# Patient Record
Sex: Female | Born: 1990
Health system: Southern US, Community
[De-identification: ages and names within clinical notes are randomized; demographics above are authoritative.]

## PROBLEM LIST (undated history)

## (undated) ENCOUNTER — Ambulatory Visit: Discharge status: Absent without leave | Payer: 59

## (undated) ENCOUNTER — Emergency Department (HOSPITAL_COMMUNITY): Discharge status: Absent without leave | Payer: BLUE CROSS/BLUE SHIELD

## (undated) DIAGNOSIS — F329 Major depressive disorder, single episode, unspecified: Secondary | ICD-10-CM

## (undated) DIAGNOSIS — F32A Depression, unspecified: Secondary | ICD-10-CM

## (undated) DIAGNOSIS — F419 Anxiety disorder, unspecified: Secondary | ICD-10-CM

## (undated) DIAGNOSIS — E079 Disorder of thyroid, unspecified: Secondary | ICD-10-CM

## (undated) DIAGNOSIS — L732 Hidradenitis suppurativa: Secondary | ICD-10-CM

## (undated) DIAGNOSIS — E282 Polycystic ovarian syndrome: Secondary | ICD-10-CM

## (undated) DIAGNOSIS — N938 Other specified abnormal uterine and vaginal bleeding: Secondary | ICD-10-CM

## (undated) DIAGNOSIS — E039 Hypothyroidism, unspecified: Secondary | ICD-10-CM

## (undated) DIAGNOSIS — L0291 Cutaneous abscess, unspecified: Secondary | ICD-10-CM

## (undated) HISTORY — PX: TONSILLECTOMY: SUR1361

## (undated) HISTORY — DX: Other specified abnormal uterine and vaginal bleeding: N93.8

## (undated) HISTORY — PX: ADENOIDECTOMY: SHX5191

## (undated) HISTORY — PX: HERNIA REPAIR: SHX51

---
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
## 2009-04-05 ENCOUNTER — Emergency Department (HOSPITAL_COMMUNITY): Admission: EM | Admit: 2009-04-05 | Discharge: 2009-04-05 | Payer: Self-pay | Admitting: Emergency Medicine

## 2009-05-14 ENCOUNTER — Emergency Department (HOSPITAL_COMMUNITY): Admission: EM | Admit: 2009-05-14 | Discharge: 2009-05-14 | Payer: Self-pay | Admitting: Emergency Medicine

## 2009-05-22 ENCOUNTER — Emergency Department (HOSPITAL_COMMUNITY): Admission: EM | Admit: 2009-05-22 | Discharge: 2009-05-22 | Payer: Self-pay | Admitting: Emergency Medicine

## 2009-10-18 ENCOUNTER — Emergency Department (HOSPITAL_COMMUNITY): Admission: EM | Admit: 2009-10-18 | Discharge: 2009-10-18 | Payer: Self-pay | Admitting: Emergency Medicine

## 2009-11-10 ENCOUNTER — Emergency Department (HOSPITAL_COMMUNITY): Admission: EM | Admit: 2009-11-10 | Discharge: 2009-11-10 | Payer: Self-pay | Admitting: Emergency Medicine

## 2009-12-17 ENCOUNTER — Emergency Department (HOSPITAL_COMMUNITY)
Admission: EM | Admit: 2009-12-17 | Discharge: 2009-12-17 | Payer: Self-pay | Source: Home / Self Care | Admitting: Emergency Medicine

## 2009-12-19 ENCOUNTER — Emergency Department (HOSPITAL_COMMUNITY): Admission: EM | Admit: 2009-12-19 | Discharge: 2009-09-15 | Payer: Self-pay | Admitting: Emergency Medicine

## 2010-01-11 ENCOUNTER — Emergency Department (HOSPITAL_COMMUNITY)
Admission: EM | Admit: 2010-01-11 | Discharge: 2010-01-11 | Payer: Self-pay | Source: Home / Self Care | Admitting: Emergency Medicine

## 2010-04-17 LAB — RAPID STREP SCREEN (MED CTR MEBANE ONLY): Streptococcus, Group A Screen (Direct): NEGATIVE

## 2010-04-17 LAB — STREP A DNA PROBE

## 2010-04-18 LAB — URINALYSIS, ROUTINE W REFLEX MICROSCOPIC
Bilirubin Urine: NEGATIVE
Nitrite: NEGATIVE
Specific Gravity, Urine: 1.025 (ref 1.005–1.030)
Urobilinogen, UA: 0.2 mg/dL (ref 0.0–1.0)
pH: 6.5 (ref 5.0–8.0)

## 2010-04-18 LAB — URINE MICROSCOPIC-ADD ON

## 2010-04-18 LAB — PREGNANCY, URINE: Preg Test, Ur: NEGATIVE

## 2010-05-27 NOTE — Op Note (Signed)
NAMEJENEFER, Kathleen Velasquez               ACCOUNT NO.:  [id number]   MEDICAL RECORD NO.:  [id number]          PATIENT TYPE:  AMB   LOCATION:  DSC                          FACILITY:  [facility]   PHYSICIAN:  Karol T. Lazarus Salines, M.D. DATE OF BIRTH:  1990/08/02   DATE OF PROCEDURE:  08/02/2006  DATE OF DISCHARGE:                               OPERATIVE REPORT   PREOPERATIVE DIAGNOSIS:  Recurrent streptococcal tonsillitis with  tonsillar hypertrophy.   POSTOPERATIVE DIAGNOSIS:  Recurrent streptococcal tonsillitis with  tonsillar hypertrophy.   PROCEDURE PERFORMED:  Tonsillectomy and adenoidectomy.   SURGEON:  Gloris Manchester. Lazarus Salines, M.D.   ANESTHESIA:  General orotracheal.   BLOOD LOSS:  Minimal.   COMPLICATIONS:  None.   FINDINGS:  There were 3-4+ very bulky tonsils with a normal soft palate.  75% obstructive adenoids.  Congested inferior turbinates.   PROCEDURE:  With the patient in a comfortable supine position, general  orotracheal anesthesia was induced without difficulty.  At an  appropriate level, the table was turned 90 degrees and the patient  placed in Trendelenburg.  A clean preparation and draping was  accomplished.  Taking care to protect lips, teeth, and endotracheal  tube, the Crowe-Davis mouth gag was introduced, expanded for  visualization, and suspended from the Mayo stand in the standard  fashion.  The findings were as described above.  Palate retractor and  mirror were used to visualize the nasopharynx with the findings as  described above.  Nasal speculum was used to inspect the anterior nose  with the findings as described above.  We infiltrated 0.50% Xylocaine  with 1:200,000 epinephrine, 8 mL total into the peritonsillar planes for  intraoperative hemostasis.  Several minutes were allowed for this to  take effect.   The adenoid pad was swept free of the nasopharynx using a single midline  pass with a sharp adenoid curette.  The tissue was carefully removed  from the  field and passed off as specimen.  The nasopharynx was  suctioned clean and packed with saline moistened tonsil sponges for  hemostasis.   Beginning on the left side, the tonsil was grasped and retracted  medially.  The mucosa overlying the anterior and superior poles was  coagulated and then cut down to the capsule of the tonsil.  Using the  cautery tip as a blunt dissector, lysing fibrous bands, and coagulating  crossing vessels as identified, the tonsil was dissected from its  muscular fossa.  There was a moderate amount of fibrosis consistent with  recurrent infections.  The tonsil was removed in its entirety as  determined by examination both tonsil and fossa.  A small additional  quantity of cautery rendered the fossa hemostatic.  After completing the  left tonsillectomy, the right side was done in identical fashion.   After completing both tonsillectomies and rendering the oropharynx  hemostatic, the nasopharynx was unpacked.  A red rubber catheter was  passed through the nose and out the mouth to serve as a [employer] investment [employer].  Using suction cautery and indirect visualization, small  adenoid tags at the cornea and lateral bands were ablated,  and the  adenoid bed proper was cauterized for hemostasis.  This was done in  several passes using irrigation to accurately localize the bleeding  sites.  Upon achieving hemostasis in the nasopharynx, the oropharynx was  again observed to be hemostatic.  At this point, the palate retractor  and mouth gag were relaxed for several minutes.  Upon re-expansion,  hemostasis was persistent.  At this point, the procedure was completed.  The palate retractor and mouth gag were relaxed and removed.  The dental  status was intact including orthodontic appliances.  The patient was  returned to anesthesia, awakened, extubated, and transferred to recovery  in stable condition.   COMMENT:  A 20 year old white female with a history of recurrent   streptococcal tonsillitis as well as obstructive hypertrophy was the  indication for today's procedure.  Anticipated routine postoperative  recovery with attention to analgesia, antibiosis, hydration, and  observation for bleeding, emesis, or airway compromise.      Gloris Manchester. Lazarus Salines, M.D.  Electronically Signed     KTW/MEDQ  D:  08/02/2006  T:  08/02/2006  Job:  [id number]   cc:   Jonell Cluck, M.D.

## 2010-06-01 ENCOUNTER — Emergency Department (HOSPITAL_COMMUNITY)
Admission: EM | Admit: 2010-06-01 | Discharge: 2010-06-02 | Disposition: A | Discharge status: Home / Self Care | Payer: Self-pay | Attending: Emergency Medicine | Admitting: Emergency Medicine

## 2010-06-01 DIAGNOSIS — L600 Ingrowing nail: Secondary | ICD-10-CM | POA: Insufficient documentation

## 2010-06-09 ENCOUNTER — Emergency Department (HOSPITAL_COMMUNITY)
Admission: EM | Admit: 2010-06-09 | Discharge: 2010-06-09 | Disposition: A | Discharge status: Home / Self Care | Payer: Self-pay | Attending: Emergency Medicine | Admitting: Emergency Medicine

## 2010-06-09 DIAGNOSIS — L02619 Cutaneous abscess of unspecified foot: Secondary | ICD-10-CM | POA: Insufficient documentation

## 2010-06-09 DIAGNOSIS — L03039 Cellulitis of unspecified toe: Secondary | ICD-10-CM | POA: Insufficient documentation

## 2010-07-14 ENCOUNTER — Inpatient Hospital Stay (HOSPITAL_COMMUNITY): Payer: Self-pay

## 2010-07-14 ENCOUNTER — Inpatient Hospital Stay (HOSPITAL_COMMUNITY)
Admission: AD | Admit: 2010-07-14 | Discharge: 2010-07-14 | Disposition: A | Discharge status: Home / Self Care | Payer: Self-pay | Source: Ambulatory Visit | Attending: Obstetrics & Gynecology | Admitting: Obstetrics & Gynecology

## 2010-07-14 DIAGNOSIS — N949 Unspecified condition associated with female genital organs and menstrual cycle: Secondary | ICD-10-CM

## 2010-07-14 DIAGNOSIS — N938 Other specified abnormal uterine and vaginal bleeding: Secondary | ICD-10-CM

## 2010-07-14 LAB — URINALYSIS, ROUTINE W REFLEX MICROSCOPIC
Bilirubin Urine: NEGATIVE
Ketones, ur: NEGATIVE mg/dL
Nitrite: NEGATIVE
Protein, ur: NEGATIVE mg/dL
Urobilinogen, UA: 0.2 mg/dL (ref 0.0–1.0)

## 2010-07-14 LAB — WET PREP, GENITAL
Trich, Wet Prep: NONE SEEN
Yeast Wet Prep HPF POC: NONE SEEN

## 2010-07-14 LAB — CBC
HCT: 40.7 % (ref 36.0–46.0)
MCH: 28.3 pg (ref 26.0–34.0)
MCHC: 32.7 g/dL (ref 30.0–36.0)
MCV: 86.6 fL (ref 78.0–100.0)
Platelets: 326 10*3/uL (ref 150–400)
RDW: 16.2 % — ABNORMAL HIGH (ref 11.5–15.5)
WBC: 11.1 10*3/uL — ABNORMAL HIGH (ref 4.0–10.5)

## 2010-07-14 LAB — URINE MICROSCOPIC-ADD ON

## 2010-07-15 LAB — GC/CHLAMYDIA PROBE AMP, GENITAL
Chlamydia, DNA Probe: NEGATIVE
GC Probe Amp, Genital: NEGATIVE

## 2010-07-28 ENCOUNTER — Emergency Department (HOSPITAL_COMMUNITY): Payer: BC Managed Care – PPO

## 2010-07-28 ENCOUNTER — Emergency Department (HOSPITAL_COMMUNITY)
Admission: EM | Admit: 2010-07-28 | Discharge: 2010-07-28 | Disposition: A | Discharge status: Home / Self Care | Payer: BC Managed Care – PPO | Attending: Emergency Medicine | Admitting: Emergency Medicine

## 2010-07-28 ENCOUNTER — Encounter: Payer: Self-pay | Admitting: *Deleted

## 2010-07-28 DIAGNOSIS — M542 Cervicalgia: Secondary | ICD-10-CM | POA: Insufficient documentation

## 2010-07-28 DIAGNOSIS — M25519 Pain in unspecified shoulder: Secondary | ICD-10-CM | POA: Insufficient documentation

## 2010-07-28 DIAGNOSIS — R079 Chest pain, unspecified: Secondary | ICD-10-CM | POA: Insufficient documentation

## 2010-07-28 DIAGNOSIS — S161XXA Strain of muscle, fascia and tendon at neck level, initial encounter: Secondary | ICD-10-CM

## 2010-07-28 DIAGNOSIS — M545 Low back pain, unspecified: Secondary | ICD-10-CM

## 2010-07-28 DIAGNOSIS — S20219A Contusion of unspecified front wall of thorax, initial encounter: Secondary | ICD-10-CM

## 2010-07-28 DIAGNOSIS — S7002XA Contusion of left hip, initial encounter: Secondary | ICD-10-CM

## 2010-07-28 DIAGNOSIS — S7000XA Contusion of unspecified hip, initial encounter: Secondary | ICD-10-CM | POA: Insufficient documentation

## 2010-07-28 DIAGNOSIS — S139XXA Sprain of joints and ligaments of unspecified parts of neck, initial encounter: Secondary | ICD-10-CM | POA: Insufficient documentation

## 2010-07-28 MED ORDER — KETOROLAC TROMETHAMINE 30 MG/ML IJ SOLN
30.0000 mg | Freq: Once | INTRAMUSCULAR | Status: AC
  Administered 2010-07-28: 30 mg via INTRAVENOUS
  Filled 2010-07-28: qty 1

## 2010-07-28 MED ORDER — HYDROCODONE-ACETAMINOPHEN 5-325 MG PO TABS: ORAL_TABLET | ORAL | Status: AC

## 2010-07-28 MED ORDER — HYDROCODONE-ACETAMINOPHEN 5-325 MG PO TABS
1.0000 | ORAL_TABLET | Freq: Once | ORAL | Status: AC
  Administered 2010-07-28: 1 via ORAL
  Filled 2010-07-28: qty 1

## 2010-07-28 MED ORDER — CYCLOBENZAPRINE HCL 10 MG PO TABS: 10.0000 mg | ORAL_TABLET | Freq: Three times a day (TID) | ORAL | Status: AC | PRN
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
## 2010-08-06 ENCOUNTER — Emergency Department (HOSPITAL_COMMUNITY)
Admission: EM | Admit: 2010-08-06 | Discharge: 2010-08-07 | Discharge status: Against Medical Advice | Payer: BC Managed Care – PPO | Attending: Emergency Medicine | Admitting: Emergency Medicine

## 2010-08-06 ENCOUNTER — Encounter (HOSPITAL_COMMUNITY): Payer: Self-pay | Admitting: *Deleted

## 2010-08-06 DIAGNOSIS — L0291 Cutaneous abscess, unspecified: Secondary | ICD-10-CM | POA: Insufficient documentation

## 2010-08-06 DIAGNOSIS — L0390 Cellulitis, unspecified: Secondary | ICD-10-CM | POA: Insufficient documentation

## 2010-08-06 DIAGNOSIS — Z5320 Procedure and treatment not carried out because of patient's decision for unspecified reasons: Secondary | ICD-10-CM | POA: Insufficient documentation

## 2010-08-06 HISTORY — DX: Cutaneous abscess, unspecified: L02.91

## 2010-08-06 NOTE — ED Notes (Signed)
Patient to lower back pain x 4 days

## 2010-08-07 NOTE — ED Notes (Signed)
Patient not in room, did not inform staff of leaving AMA

## 2010-08-07 NOTE — ED Provider Notes (Signed)
History     Chief Complaint  Patient presents with  . Abscess   HPI  Past Medical History  Diagnosis Date  . Abscess     Past Surgical History  Procedure Date  . Hernia repair   . Tonsillectomy   . Adenoidectomy     History reviewed. No pertinent family history.  History  Substance Use Topics  . Smoking status: Current Everyday Smoker -- 1.0 packs/day    Types: Cigarettes  . Smokeless tobacco: Never Used  . Alcohol Use: Yes     social use    OB History    Grav Para Term Preterm Abortions TAB SAB Ect Mult Living                  Review of Systems  Physical Exam  BP 138/76  Pulse 96  Temp(Src) 98.8 F (37.1 C) (Oral)  Resp 18  Ht 5\' 4"  (1.626 m)  Wt 264 lb (119.75 kg)  BMI 45.32 kg/m2  SpO2 100%  LMP 07/20/2010  Physical Exam  ED Course  Procedures  MDM Pt left after triage, not seen by me.       Ethelda Chick, MD 08/07/10 [phone]

## 2010-10-02 ENCOUNTER — Emergency Department (HOSPITAL_COMMUNITY)
Admission: EM | Admit: 2010-10-02 | Discharge: 2010-10-03 | Disposition: A | Discharge status: Home / Self Care | Payer: BC Managed Care – PPO | Attending: Emergency Medicine | Admitting: Emergency Medicine

## 2010-10-02 ENCOUNTER — Encounter (HOSPITAL_COMMUNITY): Payer: Self-pay | Admitting: *Deleted

## 2010-10-02 ENCOUNTER — Emergency Department (HOSPITAL_COMMUNITY): Payer: BC Managed Care – PPO

## 2010-10-02 DIAGNOSIS — R197 Diarrhea, unspecified: Secondary | ICD-10-CM | POA: Insufficient documentation

## 2010-10-02 DIAGNOSIS — M549 Dorsalgia, unspecified: Secondary | ICD-10-CM | POA: Insufficient documentation

## 2010-10-02 DIAGNOSIS — R110 Nausea: Secondary | ICD-10-CM | POA: Insufficient documentation

## 2010-10-02 DIAGNOSIS — R109 Unspecified abdominal pain: Secondary | ICD-10-CM | POA: Insufficient documentation

## 2010-10-02 DIAGNOSIS — F17200 Nicotine dependence, unspecified, uncomplicated: Secondary | ICD-10-CM | POA: Insufficient documentation

## 2010-10-02 DIAGNOSIS — N920 Excessive and frequent menstruation with regular cycle: Secondary | ICD-10-CM

## 2010-10-02 LAB — URINALYSIS, ROUTINE W REFLEX MICROSCOPIC
Bilirubin Urine: NEGATIVE
Bilirubin Urine: NEGATIVE
Glucose, UA: NEGATIVE
Ketones, ur: NEGATIVE
Leukocytes, UA: NEGATIVE
Nitrite: NEGATIVE
Specific Gravity, Urine: >1.03 — ABNORMAL HIGH (ref 1.005–1.030)
Urobilinogen, UA: 0.2 mg/dL (ref 0.0–1.0)
pH: 7

## 2010-10-02 LAB — URINE MICROSCOPIC-ADD ON

## 2010-10-02 LAB — BASIC METABOLIC PANEL
BUN: 6 mg/dL (ref 6–23)
GFR calc Af Amer: >60 mL/min (ref >60)
GFR calc non Af Amer: >60 mL/min (ref >60)
Potassium: 3.8 mEq/L (ref 3.5–5.1)

## 2010-10-02 LAB — CBC
HCT: 42.8 % (ref 36.0–46.0)
MCH: 28.5 pg (ref 26.0–34.0)
MCV: 87 fL (ref 78.0–100.0)
Platelets: 380 10*3/uL (ref 150–400)
RBC: 4.92 MIL/uL (ref 3.87–5.11)

## 2010-10-02 MED ORDER — SODIUM CHLORIDE 0.9 % IV SOLN: INTRAVENOUS | Status: DC

## 2010-10-02 MED ORDER — IOHEXOL 300 MG/ML  SOLN
100.0000 mL | Freq: Once | INTRAMUSCULAR | Status: AC | PRN
  Administered 2010-10-02: 100 mL via INTRAVENOUS

## 2010-10-02 MED ORDER — SODIUM CHLORIDE 0.9 % IV BOLUS (SEPSIS)
250.0000 mL | Freq: Once | INTRAVENOUS | Status: AC
  Administered 2010-10-02: 18:00:00 via INTRAVENOUS

## 2010-10-02 MED ORDER — HYDROMORPHONE HCL 1 MG/ML IJ SOLN
1.0000 mg | Freq: Once | INTRAMUSCULAR | Status: AC
  Administered 2010-10-02: 1 mg via INTRAVENOUS
  Filled 2010-10-02: qty 1

## 2010-10-02 MED ORDER — HYDROMORPHONE HCL 1 MG/ML IJ SOLN
1.0000 mg | Freq: Once | INTRAMUSCULAR | Status: AC
  Administered 2010-10-02: 1 mg via INTRAVENOUS

## 2010-10-02 MED ORDER — ONDANSETRON HCL 4 MG/2ML IJ SOLN
4.0000 mg | Freq: Once | INTRAMUSCULAR | Status: AC
  Administered 2010-10-02: 4 mg via INTRAVENOUS
  Filled 2010-10-02: qty 2

## 2010-10-02 MED ORDER — HYDROMORPHONE HCL 1 MG/ML IJ SOLN
INTRAMUSCULAR | Status: AC
  Administered 2010-10-02: 1 mg via INTRAVENOUS
  Filled 2010-10-02: qty 1

## 2010-10-02 NOTE — ED Provider Notes (Signed)
History   Scribed for Kathleen Jakes, MD, the patient was seen in room APA17/APA17. This chart was scribed by Clarita Crane. This patient's care was started at 5:03PM.  CSN: [id number] Arrival date & time: 10/02/2010  4:54 PM  Chief Complaint  Patient presents with  . Flank Pain    HPI    HPI ANALIS Kathleen Velasquez is a 20 y.o. female who presents to the Emergency Department complaining of moderate to severe waxing and waning cramping pain to bilateral sides of pelvis and back onset 7 hours ago and persistent since with associated diarrhea and nausea. Denies vomiting, hematochezia, chest pain, diaphoresis, HA. Reports she has experienced no relief from 800mg  Ibuprofen. Patient notes she was recently started on Sprintec after being taken off of Megestrol by Dr. Despina Hidden several days prior to onset of current symptoms. Reports she started her menstrual cycle yesterday with last menstrual cycle prior to current 2 months ago (07/22/2010). Patient with h/o left inguinal hernia, tonsillectomy and adenoidectomy.   HPI ELEMENTS: Location: bilateral sides of lower back and pelvis Onset: 7 hours ago Duration: persistent since onset  Timing: waxing and waning  Quality: cramping  Severity: moderate to severe   Context:  as above  Associated symptoms: +diarrhea and nausea.  Denies vomiting, hematochezia, chest pain, diaphoresis, HA.   PAST MEDICAL HISTORY:  Past Medical History  Diagnosis Date  . Abscess     PAST SURGICAL HISTORY:  Past Surgical History  Procedure Date  . Hernia repair   . Tonsillectomy   . Adenoidectomy     FAMILY HISTORY:  History reviewed. No pertinent family history.   SOCIAL HISTORY: History   Social History  . Marital Status: Single    Spouse Name: N/A    Number of Children: N/A  . Years of Education: N/A   Social History Main Topics  . Smoking status: Current Everyday Smoker -- 1.0 packs/day    Types: Cigarettes  . Smokeless tobacco: Never Used  . Alcohol  Use: Yes     social use  . Drug Use: No  . Sexually Active: No   Other Topics Concern  . None   Social History Narrative  . None      Review of Systems  Review of Systems  Constitutional: Negative for fever.  HENT: Negative for rhinorrhea.   Eyes: Negative for pain.  Respiratory: Negative for cough and shortness of breath.   Cardiovascular: Negative for chest pain.  Gastrointestinal: Positive for nausea, abdominal pain and diarrhea. Negative for vomiting and blood in stool.  Genitourinary: Negative for dysuria.  Musculoskeletal: Positive for back pain.  Skin: Negative for rash.  Neurological: Negative for weakness and headaches.    Allergies  Bactrim and Penicillins  Home Medications   Current Outpatient Rx  Name Route Sig Dispense Refill  . IBUPROFEN 200 MG PO TABS Oral Take 800 mg by mouth every 6 (six) hours as needed. For pain     . SPRINTEC 28 PO Oral Take 1 tablet by mouth at bedtime. At 10pm every night     . MEGESTROL ACETATE 20 MG PO TABS Oral Take 20 mg by mouth daily.        Physical Exam    BP 111/64  Pulse 75  Temp(Src) 98.6 F (37 C) (Oral)  Resp 23  Ht 5\' 5"  (1.651 m)  Wt 260 lb (117.935 kg)  BMI 43.27 kg/m2  SpO2 100%  LMP 10/01/2010  Physical Exam  Nursing note and vitals reviewed. Constitutional: She  is oriented to person, place, and time. She appears well-developed and well-nourished.  HENT:  Head: Normocephalic and atraumatic.  Eyes: Conjunctivae are normal. Pupils are equal, round, and reactive to light.  Neck: Neck supple.  Cardiovascular: Normal rate and regular rhythm.  Exam reveals no gallop and no friction rub.   No murmur heard. Pulmonary/Chest: Effort normal and breath sounds normal. She has no wheezes.  Abdominal: Soft. Bowel sounds are normal. She exhibits no distension. There is no tenderness. There is no CVA tenderness.  Musculoskeletal: Normal range of motion. She exhibits no edema.  Neurological: She is alert and  oriented to person, place, and time. No sensory deficit.  Skin: Skin is warm and dry.  Psychiatric: She has a normal mood and affect. Her behavior is normal.    ED Course  Procedures  OTHER DATA REVIEWED: Nursing notes, vital signs, and past medical records reviewed. Lab results reviewed and considered Imaging results reviewed and considered  DIAGNOSTIC STUDIES: Oxygen Saturation is 100% on room air, normal by my interpretation.    LABS / RADIOLOGY: Results for orders placed during the hospital encounter of 10/02/10  URINALYSIS, ROUTINE W REFLEX MICROSCOPIC      Component Value Range   Color, Urine YELLOW  YELLOW    Appearance CLEAR  CLEAR    Specific Gravity, Urine >1.030 (*) 1.005 - 1.030    pH 6.0  5.0 - 8.0    Glucose, UA NEGATIVE  NEGATIVE (mg/dL)   Hgb urine dipstick MODERATE (*) NEGATIVE    Bilirubin Urine NEGATIVE  NEGATIVE    Ketones, ur TRACE (*) NEGATIVE (mg/dL)   Protein, ur TRACE (*) NEGATIVE (mg/dL)   Urobilinogen, UA 0.2  0.0 - 1.0 (mg/dL)   Nitrite NEGATIVE  NEGATIVE    Leukocytes, UA NEGATIVE  NEGATIVE   BASIC METABOLIC PANEL      Component Value Range   Sodium 135  135 - 145 (mEq/L)   Potassium 3.8  3.5 - 5.1 (mEq/L)   Chloride 102  96 - 112 (mEq/L)   CO2 22  19 - 32 (mEq/L)   Glucose, Bld 98  70 - 99 (mg/dL)   BUN 6  6 - 23 (mg/dL)   Creatinine, Ser 4.09  0.50 - 1.10 (mg/dL)   Calcium 9.5  8.4 - 81.1 (mg/dL)   GFR calc non Af Amer >60  >60 (mL/min)   GFR calc Af Amer >60  >60 (mL/min)  CBC      Component Value Range   WBC 15.8 (*) 4.0 - 10.5 (K/uL)   RBC 4.92  3.87 - 5.11 (MIL/uL)   Hemoglobin 14.0  12.0 - 15.0 (g/dL)   HCT 91.4  78.2 - 95.6 (%)   MCV 87.0  78.0 - 100.0 (fL)   MCH 28.5  26.0 - 34.0 (pg)   MCHC 32.7  30.0 - 36.0 (g/dL)   RDW 21.3  08.6 - 57.8 (%)   Platelets 380  150 - 400 (K/uL)  POCT PREGNANCY, URINE      Component Value Range   Preg Test, Ur NEGATIVE    URINE MICROSCOPIC-ADD ON      Component Value Range   Squamous  Epithelial / LPF FEW (*) RARE    WBC, UA 3-6  <3 (WBC/hpf)   RBC / HPF 7-10  <3 (RBC/hpf)   Bacteria, UA FEW (*) RARE    Casts GRANULAR CAST (*) NEGATIVE    Urine-Other MUCOUS PRESENT     Ct Abdomen Pelvis W Contrast  10/02/2010  *  RADIOLOGY REPORT*  Clinical Data: 20 year old female with bilateral flank and abdominal pain with nausea.  CT ABDOMEN AND PELVIS WITH CONTRAST  Technique:  Multidetector CT imaging of the abdomen and pelvis was performed following the standard protocol during bolus administration of intravenous contrast.  Contrast: OMNIPAQUE IOHEXOL 300 MG/ML IV SOLN  Comparison: 02/07/2004  Findings: The liver, spleen, adrenal glands, kidneys, pancreas and gallbladder are unremarkable.  No free fluid, enlarged lymph nodes, biliary dilation or abdominal aortic aneurysm identified. The bowel and bladder are unremarkable. Fluid in the cervix/vagina is noted may represent blood. There is no evidence of mass, bowel obstruction or pneumoperitoneum.  No acute or suspicious bony abnormalities are identified.  IMPRESSION: Fluid within the vagina/cervix which may be related to menses.  No other significant abnormalities identified.  Original Report Authenticated By: Rosendo Gros, M.D.    PROCEDURES:  ED COURSE / COORDINATION OF CARE: Orders Placed This Encounter  Procedures  . CT Abdomen Pelvis W Contrast  . Urinalysis, Routine w reflex microscopic  . Basic metabolic panel  . CBC  . Urine microscopic-add on  . In and Out Cath  . Maintain IV access  . Pregnancy, urine POC  7:27PM- Patient reports pain not improved at this time. Informed of current lab results. Will perform CT scan for further evaluation.  7:51PM- Nurse informs that patient states vaginal bleeding has slowed significantly.  12:03AM- Patient informed of lab and imaging results. Informed of intent to d/c home. Patient agrees with plan set forth at this time.  MDM: Differential Diagnosis:  SUSPECT HEAVY MENSES AND  CRAMPS FOLLOWING SUPRESSION THERAPY. CT NEGATIVE.    PLAN: Discharge Home The patient is to return the emergency department if there is any worsening of symptoms. I have reviewed the discharge instructions with the patient/family  CONDITION ON DISCHARGE: Good.  DIAGNOSIS: No diagnosis found.   MEDICATIONS GIVEN IN THE E.D.  Medications  Norgestimate-Eth Estradiol (SPRINTEC 28 PO) (not administered)  0.9 %  sodium chloride infusion (not administered)  sodium chloride 0.9 % bolus 250 mL ( mL Intravenous Given 10/02/10 1804)  HYDROmorphone (DILAUDID) injection 1 mg (1 mg Intravenous Given 10/02/10 1803)  ondansetron (ZOFRAN) injection 4 mg (4 mg Intravenous Given 10/02/10 1803)  HYDROmorphone (DILAUDID) injection 1 mg (1 mg Intravenous Given 10/02/10 1943)  ondansetron (ZOFRAN) injection 4 mg (4 mg Intravenous Given 10/02/10 1943)  iohexol (OMNIPAQUE) 300 MG/ML injection 100 mL (100 mL Intravenous Contrast Given 10/02/10 2058)  HYDROmorphone (DILAUDID) injection 1 mg (1 mg Intravenous Given 10/02/10 2330)      I personally performed the services described in this documentation, which was scribed in my presence. The recorded information has been reviewed and considered. Kathleen Jakes, MD      Kathleen Jakes, MD 10/03/10 [phone]

## 2010-10-02 NOTE — ED Notes (Signed)
Pt c/o pain to llq that radiates around to lower back.

## 2010-10-03 MED ORDER — OXYCODONE-ACETAMINOPHEN 5-325 MG PO TABS: 1.0000 | ORAL_TABLET | ORAL | Status: AC | PRN

## 2010-11-29 ENCOUNTER — Encounter (HOSPITAL_COMMUNITY): Payer: Self-pay

## 2010-11-29 ENCOUNTER — Emergency Department (HOSPITAL_COMMUNITY)
Admission: EM | Admit: 2010-11-29 | Discharge: 2010-11-29 | Disposition: A | Discharge status: Home / Self Care | Payer: BC Managed Care – PPO | Attending: Emergency Medicine | Admitting: Emergency Medicine

## 2010-11-29 DIAGNOSIS — K029 Dental caries, unspecified: Secondary | ICD-10-CM | POA: Insufficient documentation

## 2010-11-29 DIAGNOSIS — R51 Headache: Secondary | ICD-10-CM | POA: Insufficient documentation

## 2010-11-29 DIAGNOSIS — F17200 Nicotine dependence, unspecified, uncomplicated: Secondary | ICD-10-CM | POA: Insufficient documentation

## 2010-11-29 MED ORDER — ERYTHROMYCIN BASE 250 MG PO TABS: ORAL_TABLET | ORAL | Status: DC

## 2010-11-29 MED ORDER — MELOXICAM 7.5 MG PO TABS: ORAL_TABLET | ORAL | Status: DC

## 2010-11-29 MED ORDER — TRAMADOL HCL 50 MG PO TABS: ORAL_TABLET | ORAL | Status: DC

## 2010-11-29 NOTE — ED Notes (Signed)
Pt c/o pain to left ear x 2 days.  Reports ear feels puffy behind her ear and pain down into left side of neck.

## 2010-11-29 NOTE — ED Provider Notes (Signed)
History     CSN: [id number] Arrival date & time: 11/29/2010 10:52 AM   None     Chief Complaint  Patient presents with  . Otalgia    (Consider location/radiation/quality/duration/timing/severity/associated sxs/prior treatment) Patient is a 20 y.o. female presenting with ear pain. The history is provided by the patient.  Otalgia This is a new problem. The current episode started 2 days ago. There is pain in the left ear. The problem occurs constantly. The problem has not changed since onset.There has been no fever. The pain is moderate. Associated symptoms include headaches. Pertinent negatives include no ear discharge, no sore throat, no abdominal pain, no vomiting, no neck pain, no cough and no rash. Her past medical history does not include chronic ear infection or hearing loss.    Past Medical History  Diagnosis Date  . Abscess     Past Surgical History  Procedure Date  . Hernia repair   . Tonsillectomy   . Adenoidectomy     No family history on file.  History  Substance Use Topics  . Smoking status: Current Everyday Smoker -- 1.0 packs/day    Types: Cigarettes  . Smokeless tobacco: Never Used  . Alcohol Use: Yes     social use    OB History    Grav Para Term Preterm Abortions TAB SAB Ect Mult Living                  Review of Systems  Constitutional: Negative for activity change.       All ROS Neg except as noted in HPI  HENT: Positive for ear pain and dental problem. Negative for nosebleeds, sore throat, neck pain and ear discharge.   Eyes: Negative for photophobia and discharge.  Respiratory: Negative for cough, shortness of breath and wheezing.   Cardiovascular: Negative for chest pain and palpitations.  Gastrointestinal: Negative for vomiting, abdominal pain and blood in stool.  Genitourinary: Negative for dysuria, frequency and hematuria.  Musculoskeletal: Negative for back pain and arthralgias.  Skin: Negative.  Negative for rash.  Neurological:  Positive for headaches. Negative for dizziness, seizures and speech difficulty.  Psychiatric/Behavioral: Negative for hallucinations and confusion.    Allergies  Bactrim and Penicillins  Home Medications   Current Outpatient Rx  Name Route Sig Dispense Refill  . IBUPROFEN 200 MG PO TABS Oral Take 800 mg by mouth every 6 (six) hours as needed. For pain       BP 144/81  Pulse 85  Temp(Src) 98.6 F (37 C) (Oral)  Resp 18  Ht 5\' 5"  (1.651 m)  Wt 263 lb (119.296 kg)  BMI 43.77 kg/m2  SpO2 100%  LMP 10/01/2010  Physical Exam  Nursing note and vitals reviewed. Constitutional: She is oriented to person, place, and time. She appears well-developed and well-nourished.  Non-toxic appearance.  HENT:  Head: Normocephalic.  Right Ear: Tympanic membrane and external ear normal.  Left Ear: Tympanic membrane and external ear normal.  Mouth/Throat: Oropharynx is clear and moist.       Multiple dental caries. Left more than right.  Eyes: EOM and lids are normal. Pupils are equal, round, and reactive to light.  Neck: Normal range of motion. Neck supple. Carotid bruit is not present.  Cardiovascular: Normal rate, regular rhythm, normal heart sounds, intact distal pulses and normal pulses.   Pulmonary/Chest: Breath sounds normal. No respiratory distress.  Abdominal: Soft. Bowel sounds are normal. There is no tenderness. There is no guarding.  Musculoskeletal: Normal range of motion.  Lymphadenopathy:       Head (right side): No submandibular adenopathy present.       Head (left side): No submandibular adenopathy present.    She has no cervical adenopathy.  Neurological: She is alert and oriented to person, place, and time. She has normal strength. No cranial nerve deficit or sensory deficit.  Skin: Skin is warm and dry.  Psychiatric: She has a normal mood and affect. Her speech is normal.    ED Course: Pt to see a dentist as soon as possible.  Procedures (including critical care  time)  Labs Reviewed - No data to display No results found.   No diagnosis found.    MDM  I have reviewed nursing notes, vital signs, and all appropriate lab and imaging results for this patient.        Kathie Dike, [place] 12/07/10 [phone]

## 2010-11-29 NOTE — ED Notes (Signed)
Pt a/ox4. resp even and unlabored. NAD at this time. D/C instructions  And Rx x3 reviewed with pt. Pt verbalized understanding. Pt ambulated to POV with steady gate.

## 2010-12-09 NOTE — ED Provider Notes (Signed)
Medical screening examination/treatment/procedure(s) were performed by non-physician practitioner and as supervising physician I was immediately available for consultation/collaboration.  Shelda Jakes, MD 12/09/10 [phone]

## 2010-12-29 ENCOUNTER — Emergency Department (HOSPITAL_COMMUNITY)
Admission: EM | Admit: 2010-12-29 | Discharge: 2010-12-30 | Discharge status: Against Medical Advice | Payer: BC Managed Care – PPO | Attending: Emergency Medicine | Admitting: Emergency Medicine

## 2010-12-29 ENCOUNTER — Encounter (HOSPITAL_COMMUNITY): Payer: Self-pay

## 2010-12-29 DIAGNOSIS — Z0389 Encounter for observation for other suspected diseases and conditions ruled out: Secondary | ICD-10-CM | POA: Insufficient documentation

## 2010-12-29 NOTE — ED Notes (Signed)
Pt reports in the past 3 weeks has had 5 styes on eyes.  Reports this sty is on the upper lid of left eye.  Eye is red and swollen.

## 2010-12-30 ENCOUNTER — Encounter (HOSPITAL_COMMUNITY): Payer: Self-pay | Admitting: *Deleted

## 2010-12-30 ENCOUNTER — Emergency Department (HOSPITAL_COMMUNITY)
Admission: EM | Admit: 2010-12-30 | Discharge: 2010-12-30 | Disposition: A | Discharge status: Home / Self Care | Payer: BC Managed Care – PPO | Attending: Emergency Medicine | Admitting: Emergency Medicine

## 2010-12-30 DIAGNOSIS — H5710 Ocular pain, unspecified eye: Secondary | ICD-10-CM | POA: Insufficient documentation

## 2010-12-30 DIAGNOSIS — F17200 Nicotine dependence, unspecified, uncomplicated: Secondary | ICD-10-CM | POA: Insufficient documentation

## 2010-12-30 DIAGNOSIS — H0016 Chalazion left eye, unspecified eyelid: Secondary | ICD-10-CM

## 2010-12-30 DIAGNOSIS — H0019 Chalazion unspecified eye, unspecified eyelid: Secondary | ICD-10-CM | POA: Insufficient documentation

## 2010-12-30 MED ORDER — TOBRAMYCIN 0.3 % OP SOLN
2.0000 [drp] | Freq: Four times a day (QID) | OPHTHALMIC | Status: DC
  Administered 2010-12-30: 2 [drp] via OPHTHALMIC
  Filled 2010-12-30: qty 5

## 2010-12-30 MED ORDER — DOXYCYCLINE HYCLATE 100 MG PO CAPS: 100.0000 mg | ORAL_CAPSULE | Freq: Two times a day (BID) | ORAL | Status: AC

## 2010-12-30 MED ORDER — DOXYCYCLINE HYCLATE 100 MG PO TABS
100.0000 mg | ORAL_TABLET | Freq: Once | ORAL | Status: AC
  Administered 2010-12-30: 100 mg via ORAL
  Filled 2010-12-30: qty 1

## 2010-12-30 NOTE — ED Provider Notes (Signed)
History     CSN: [id number] Arrival date & time: 12/30/2010  6:01 PM   None     Chief Complaint  Patient presents with  . Eye Pain    (Consider location/radiation/quality/duration/timing/severity/associated sxs/prior treatment) Patient is a 20 y.o. female presenting with eye pain. The history is provided by the patient. No language interpreter was used.  Eye Pain This is a new problem. Episode onset: 2 days ago. The problem occurs constantly. The problem has been gradually worsening. Exacerbated by: blinking. She has tried nothing for the symptoms.    Past Medical History  Diagnosis Date  . Abscess     Past Surgical History  Procedure Date  . Hernia repair   . Tonsillectomy   . Adenoidectomy     History reviewed. No pertinent family history.  History  Substance Use Topics  . Smoking status: Current Everyday Smoker -- 1.0 packs/day    Types: Cigarettes  . Smokeless tobacco: Never Used  . Alcohol Use: Yes     social use    OB History    Grav Para Term Preterm Abortions TAB SAB Ect Mult Living                  Review of Systems  Eyes: Positive for pain. Negative for photophobia, discharge, redness, itching and visual disturbance.  All other systems reviewed and are negative.    Allergies  Bactrim and Penicillins  Home Medications   Current Outpatient Rx  Name Route Sig Dispense Refill  . IBUPROFEN 200 MG PO TABS Oral Take 800 mg by mouth every 6 (six) hours as needed. For pain    . MEGESTROL ACETATE 20 MG PO TABS Oral Take 20 mg by mouth at bedtime.      . TETRAHYDROZOLINE HCL 0.05 % OP SOLN Left Eye Place 2 drops into the left eye as needed. For relief       BP 133/85  Pulse 96  Temp(Src) 99.3 F (37.4 C) (Oral)  Resp 16  Ht 5\' 4"  (1.626 m)  Wt 250 lb (113.399 kg)  BMI 42.91 kg/m2  SpO2 99%  LMP 12/01/2010  Physical Exam  Nursing note and vitals reviewed. Constitutional: She is oriented to person, place, and time. She appears  well-developed and well-nourished. No distress.  HENT:  Head: Normocephalic and atraumatic.  Eyes: Conjunctivae and EOM are normal. Pupils are equal, round, and reactive to light. Right eye exhibits no chemosis, no discharge, no exudate and no hordeolum. No foreign body present in the right eye. Left eye exhibits no chemosis, no discharge, no exudate and no hordeolum. No foreign body present in the left eye. No scleral icterus.       Pt has what appears to be a chalzion under L upper lateral lid.  Mild lid swelling  Neck: Normal range of motion.  Cardiovascular: Normal rate, regular rhythm and normal heart sounds.   Pulmonary/Chest: Effort normal and breath sounds normal.  Abdominal: Soft. She exhibits no distension. There is no tenderness.  Musculoskeletal: Normal range of motion.  Neurological: She is alert and oriented to person, place, and time.  Skin: Skin is warm and dry. She is not diaphoretic.  Psychiatric: She has a normal mood and affect. Judgment normal.    ED Course  Procedures (including critical care time)  Labs Reviewed - No data to display No results found.   No diagnosis found.    MDM          Worthy Rancher, PA 12/30/10  [address]  Worthy Rancher, [place] 12/30/10 1905

## 2010-12-30 NOTE — ED Notes (Signed)
Pt has redness and swelling to her left upper eyelid since Saturday.

## 2010-12-31 NOTE — ED Provider Notes (Signed)
Medical screening examination/treatment/procedure(s) were performed by non-physician practitioner and as supervising physician I was immediately available for consultation/collaboration.  Nicoletta Dress. Colon Branch, MD 12/31/10 1343

## 2011-04-29 ENCOUNTER — Other Ambulatory Visit: Payer: Self-pay | Admitting: Obstetrics and Gynecology

## 2011-04-29 ENCOUNTER — Other Ambulatory Visit (HOSPITAL_COMMUNITY)
Admission: RE | Admit: 2011-04-29 | Discharge: 2011-04-29 | Disposition: A | Discharge status: Home / Self Care | Payer: BC Managed Care – PPO | Source: Ambulatory Visit | Attending: Obstetrics and Gynecology | Admitting: Obstetrics and Gynecology

## 2011-04-29 DIAGNOSIS — Z01419 Encounter for gynecological examination (general) (routine) without abnormal findings: Secondary | ICD-10-CM | POA: Insufficient documentation

## 2011-09-06 ENCOUNTER — Encounter (HOSPITAL_COMMUNITY): Payer: Self-pay | Admitting: *Deleted

## 2011-09-06 ENCOUNTER — Emergency Department (HOSPITAL_COMMUNITY)
Admission: EM | Admit: 2011-09-06 | Discharge: 2011-09-06 | Disposition: A | Discharge status: Home / Self Care | Payer: BC Managed Care – PPO | Attending: Emergency Medicine | Admitting: Emergency Medicine

## 2011-09-06 DIAGNOSIS — F17200 Nicotine dependence, unspecified, uncomplicated: Secondary | ICD-10-CM | POA: Insufficient documentation

## 2011-09-06 DIAGNOSIS — L02411 Cutaneous abscess of right axilla: Secondary | ICD-10-CM

## 2011-09-06 DIAGNOSIS — IMO0002 Reserved for concepts with insufficient information to code with codable children: Secondary | ICD-10-CM | POA: Insufficient documentation

## 2011-09-06 MED ORDER — IBUPROFEN 800 MG PO TABS
800.0000 mg | ORAL_TABLET | Freq: Once | ORAL | Status: AC
  Administered 2011-09-06: 800 mg via ORAL
  Filled 2011-09-06: qty 1

## 2011-09-06 MED ORDER — CIPROFLOXACIN HCL 250 MG PO TABS
500.0000 mg | ORAL_TABLET | Freq: Once | ORAL | Status: AC
  Administered 2011-09-06: 500 mg via ORAL
  Filled 2011-09-06: qty 2

## 2011-09-06 MED ORDER — ONDANSETRON HCL 4 MG PO TABS
4.0000 mg | ORAL_TABLET | Freq: Once | ORAL | Status: AC
  Administered 2011-09-06: 4 mg via ORAL
  Filled 2011-09-06: qty 1

## 2011-09-06 MED ORDER — DOXYCYCLINE HYCLATE 100 MG PO CAPS: 100.0000 mg | ORAL_CAPSULE | Freq: Two times a day (BID) | ORAL | Status: AC

## 2011-09-06 MED ORDER — DOXYCYCLINE HYCLATE 100 MG PO TABS
100.0000 mg | ORAL_TABLET | Freq: Once | ORAL | Status: AC
  Administered 2011-09-06: 100 mg via ORAL
  Filled 2011-09-06: qty 1

## 2011-09-06 MED ORDER — HYDROCODONE-ACETAMINOPHEN 5-325 MG PO TABS
2.0000 | ORAL_TABLET | Freq: Once | ORAL | Status: AC
  Administered 2011-09-06: 2 via ORAL
  Filled 2011-09-06: qty 2

## 2011-09-06 MED ORDER — CIPROFLOXACIN HCL 500 MG PO TABS: 500.0000 mg | ORAL_TABLET | Freq: Two times a day (BID) | ORAL | Status: AC

## 2011-09-06 MED ORDER — HYDROCODONE-ACETAMINOPHEN 7.5-325 MG PO TABS: 1.0000 | ORAL_TABLET | ORAL | Status: AC | PRN

## 2011-09-06 NOTE — ED Notes (Signed)
Pt c/o abscess under right arm that has been there for 3 weeks but is now hurting more. Pt also states she had nuva ring placed and questions if she is pregnant.

## 2011-09-06 NOTE — ED Notes (Signed)
See triage and EDP note.

## 2011-09-06 NOTE — ED Provider Notes (Signed)
History     CSN: [id number]  Arrival date & time 09/06/11  2042   First MD Initiated Contact with Patient 09/06/11 2155      Chief Complaint  Patient presents with  . Abscess    (Consider location/radiation/quality/duration/timing/severity/associated sxs/prior treatment) Patient is a 21 y.o. female presenting with abscess. The history is provided by the patient.  Abscess  This is a new problem. The current episode started more than one week ago. The onset was gradual. The problem occurs frequently. The problem has been gradually worsening. Affected Location: right axilla. The problem is moderate. The abscess is characterized by redness and painfulness. Pertinent negatives include no anorexia, no fever, no vomiting and no cough. There were no sick contacts.    Past Medical History  Diagnosis Date  . Abscess     Past Surgical History  Procedure Date  . Hernia repair   . Tonsillectomy   . Adenoidectomy     History reviewed. No pertinent family history.  History  Substance Use Topics  . Smoking status: Current Everyday Smoker -- 1.0 packs/day    Types: Cigarettes  . Smokeless tobacco: Never Used  . Alcohol Use: Yes     social use    OB History    Grav Para Term Preterm Abortions TAB SAB Ect Mult Living                  Review of Systems  Constitutional: Negative for fever and activity change.       All ROS Neg except as noted in HPI  HENT: Negative for nosebleeds and neck pain.   Eyes: Negative for photophobia and discharge.  Respiratory: Negative for cough, shortness of breath and wheezing.   Cardiovascular: Negative for chest pain and palpitations.  Gastrointestinal: Negative for vomiting, abdominal pain, blood in stool and anorexia.  Genitourinary: Negative for dysuria, frequency and hematuria.  Musculoskeletal: Negative for back pain and arthralgias.  Skin: Negative.        abscess  Neurological: Negative for dizziness, seizures and speech difficulty.    Psychiatric/Behavioral: Negative for hallucinations and confusion.    Allergies  Bactrim and Penicillins  Home Medications   Current Outpatient Rx  Name Route Sig Dispense Refill  . CIPROFLOXACIN HCL 500 MG PO TABS Oral Take 1 tablet (500 mg total) by mouth every 12 (twelve) hours. 10 tablet 0  . DOXYCYCLINE HYCLATE 100 MG PO CAPS Oral Take 1 capsule (100 mg total) by mouth 2 (two) times daily. 14 capsule 0  . IBUPROFEN 200 MG PO TABS Oral Take 800 mg by mouth every 6 (six) hours as needed. For pain    . MEGESTROL ACETATE 20 MG PO TABS Oral Take 20 mg by mouth at bedtime.      . TETRAHYDROZOLINE HCL 0.05 % OP SOLN Left Eye Place 2 drops into the left eye as needed. For relief       BP 121/65  Pulse 88  Temp 99.3 F (37.4 C) (Oral)  Resp 18  Ht 5\' 4"  (1.626 m)  Wt 250 lb (113.399 kg)  BMI 42.91 kg/m2  SpO2 100%  LMP 08/26/2011  Physical Exam  Nursing note and vitals reviewed. Constitutional: She is oriented to person, place, and time. She appears well-developed and well-nourished.  Non-toxic appearance.  HENT:  Head: Normocephalic.  Right Ear: Tympanic membrane and external ear normal.  Left Ear: Tympanic membrane and external ear normal.  Eyes: EOM and lids are normal. Pupils are equal, round, and reactive to  light.  Neck: Normal range of motion. Neck supple. Carotid bruit is not present.  Cardiovascular: Normal rate, regular rhythm, normal heart sounds, intact distal pulses and normal pulses.   Pulmonary/Chest: Breath sounds normal. No respiratory distress.  Abdominal: Soft. Bowel sounds are normal. There is no tenderness. There is no guarding.  Musculoskeletal: Normal range of motion.       There are 3 raised red warm areas of the right axilla. The largest one moves from the mid axilla to the outer portion of the axilla posteriorly. There is no drainage. There is no red streaking. The brachial and radial pulses are 2+ and symmetrical on the right. The grip is symmetrical  with the left. There is good capillary refill present.  Lymphadenopathy:       Head (right side): No submandibular adenopathy present.       Head (left side): No submandibular adenopathy present.    She has no cervical adenopathy.  Neurological: She is alert and oriented to person, place, and time. She has normal strength. No cranial nerve deficit or sensory deficit.  Skin: Skin is warm and dry.  Psychiatric: She has a normal mood and affect. Her speech is normal.    ED Course  Procedures (including critical care time)   Labs Reviewed  POCT PREGNANCY, URINE   No results found.   1. Abscess of axilla, right       MDM  I have reviewed nursing notes, vital signs, and all appropriate lab and imaging results for this patient. Patient has 3 abscess areas of the right axilla. Temperature is 99.3 tonight. There is no reported temperature elevation prior to her coming to the emergency department. Patient instructed to see the surgeon for proper evaluation and management of multiple abscess in one site. The patient is advised to use warm saltwater soaks, doxycycline 2 times daily with food, and Cipro 2 times daily with food. Patient is also given a prescription for Norco one every 4 hours as needed for pain #15. Surgical referral, given to the patient.       Kathie Dike, [place] 09/06/11 2207

## 2011-09-07 NOTE — ED Provider Notes (Signed)
Medical screening examination/treatment/procedure(s) were performed by non-physician practitioner and as supervising physician I was immediately available for consultation/collaboration.   Shelda Jakes, MD 09/07/11 1215

## 2011-09-14 ENCOUNTER — Encounter (HOSPITAL_COMMUNITY): Payer: Self-pay | Admitting: *Deleted

## 2011-09-14 ENCOUNTER — Emergency Department (HOSPITAL_COMMUNITY)
Admission: EM | Admit: 2011-09-14 | Discharge: 2011-09-14 | Disposition: A | Discharge status: Home / Self Care | Payer: BC Managed Care – PPO | Attending: Emergency Medicine | Admitting: Emergency Medicine

## 2011-09-14 ENCOUNTER — Emergency Department (HOSPITAL_COMMUNITY): Payer: BC Managed Care – PPO

## 2011-09-14 DIAGNOSIS — S9030XA Contusion of unspecified foot, initial encounter: Secondary | ICD-10-CM

## 2011-09-14 DIAGNOSIS — W208XXA Other cause of strike by thrown, projected or falling object, initial encounter: Secondary | ICD-10-CM | POA: Insufficient documentation

## 2011-09-14 DIAGNOSIS — F17200 Nicotine dependence, unspecified, uncomplicated: Secondary | ICD-10-CM | POA: Insufficient documentation

## 2011-09-14 MED ORDER — HYDROCODONE-ACETAMINOPHEN 5-325 MG PO TABS: 1.0000 | ORAL_TABLET | Freq: Four times a day (QID) | ORAL | Status: AC | PRN

## 2011-09-14 NOTE — ED Notes (Signed)
Dropped a car jack onto rt foot today, Hit lt 4th toe against a couch, contusion present.

## 2011-09-14 NOTE — ED Notes (Signed)
Bruising to left forth toe and dorsal aspect of right foot.

## 2011-09-14 NOTE — ED Provider Notes (Signed)
History     CSN: [id number]  Arrival date & time 09/14/11  2004   First MD Initiated Contact with Patient 09/14/11 2023      Chief Complaint  Patient presents with  . Foot Pain    (Consider location/radiation/quality/duration/timing/severity/associated sxs/prior treatment) HPI Comments: Dropped a car jack on her R foot and stubbed L 4th toe on a couch today.  Patient is a 21 y.o. female presenting with lower extremity pain. The history is provided by the patient. No language interpreter was used.  Foot Pain This is a new problem. The current episode started today. The problem has been gradually worsening. Pertinent negatives include no chills, fever or numbness. The symptoms are aggravated by walking and standing. She has tried nothing for the symptoms.    Past Medical History  Diagnosis Date  . Abscess     Past Surgical History  Procedure Date  . Hernia repair   . Tonsillectomy   . Adenoidectomy     History reviewed. No pertinent family history.  History  Substance Use Topics  . Smoking status: Current Everyday Smoker -- 1.0 packs/day    Types: Cigarettes  . Smokeless tobacco: Never Used  . Alcohol Use: Yes     social use    OB History    Grav Para Term Preterm Abortions TAB SAB Ect Mult Living                  Review of Systems  Constitutional: Negative for fever and chills.  Musculoskeletal:       Foot injuries   Skin: Positive for wound.  Neurological: Negative for numbness.  All other systems reviewed and are negative.    Allergies  Bactrim and Penicillins  Home Medications   Current Outpatient Rx  Name Route Sig Dispense Refill  . DOXYCYCLINE HYCLATE 100 MG PO CAPS Oral Take 1 capsule (100 mg total) by mouth 2 (two) times daily. 14 capsule 0  . ETONOGESTREL-ETHINYL ESTRADIOL 0.12-0.015 MG/24HR VA RING Vaginal Place 1 each vaginally every 28 (twenty-eight) days. Insert vaginally and leave in place for 3 consecutive weeks, then remove for 1  week.    Marland Kitchen HYDROCODONE-ACETAMINOPHEN 7.5-325 MG PO TABS Oral Take 1 tablet by mouth every 4 (four) hours as needed for pain. 15 tablet 0  . IBUPROFEN 200 MG PO TABS Oral Take 800 mg by mouth every 6 (six) hours as needed. For pain    . CIPROFLOXACIN HCL 500 MG PO TABS Oral Take 1 tablet (500 mg total) by mouth every 12 (twelve) hours. 10 tablet 0    BP 140/79  Pulse 100  Temp 99.5 F (37.5 C) (Oral)  Ht 5\' 4"  (1.626 m)  Wt 250 lb (113.399 kg)  BMI 42.91 kg/m2  SpO2 100%  LMP 08/26/2011  Physical Exam  Nursing note and vitals reviewed. Constitutional: She is oriented to person, place, and time. She appears well-developed and well-nourished. No distress.  HENT:  Head: Normocephalic and atraumatic.  Eyes: EOM are normal.  Neck: Normal range of motion.  Cardiovascular: Normal rate, regular rhythm and normal heart sounds.   Pulmonary/Chest: Effort normal and breath sounds normal.  Abdominal: Soft. She exhibits no distension. There is no tenderness.  Musculoskeletal: Normal range of motion.       Feet:  Neurological: She is alert and oriented to person, place, and time.  Skin: Skin is warm and dry.  Psychiatric: She has a normal mood and affect. Judgment normal.    ED Course  Procedures (  including critical care time)  Labs Reviewed - No data to display Dg Foot Complete Left  09/14/2011  *RADIOLOGY REPORT*  Clinical Data: Left fourth toe pain and bruising following an injury today.  LEFT FOOT - COMPLETE 3+ VIEW  Comparison: 09/15/2009  Findings: Mild posterior and minimal inferior calcaneal spur formation.  No fracture or dislocation seen.  IMPRESSION: No fracture.   Original Report Authenticated By: Darrol Angel, M.D.    Dg Foot Complete Right  09/14/2011  *RADIOLOGY REPORT*  Clinical Data: Right foot pain following a crush injury today.  RIGHT FOOT COMPLETE - 3+ VIEW  Comparison: 03/22/2006.  Findings: Mild posterior calcaneal spur formation.  No fracture or dislocation seen.   IMPRESSION: No fracture.   Original Report Authenticated By: Darrol Angel, M.D.      No diagnosis found.    MDM  Crutches Wt bearing as tolerated.  Ice, elevation. ibuprofen rx-hydrocodone, 12 F/u with PCP      -  Evalina Field, PA 09/14/11 2154

## 2011-09-14 NOTE — ED Notes (Signed)
Pt discharged. Pt stable at time of discharge. Medications reviewed pt has no questions regarding discharge at this time. Pt voiced understanding of discharge instructions.  

## 2011-09-15 NOTE — ED Provider Notes (Signed)
Medical screening examination/treatment/procedure(s) were performed by non-physician practitioner and as supervising physician I was immediately available for consultation/collaboration.   Joya Gaskins, MD 09/15/11 1009

## 2011-09-30 ENCOUNTER — Encounter (HOSPITAL_COMMUNITY): Payer: Self-pay | Admitting: Pharmacy Technician

## 2011-09-30 ENCOUNTER — Encounter (HOSPITAL_COMMUNITY): Payer: Self-pay

## 2011-09-30 ENCOUNTER — Encounter (HOSPITAL_COMMUNITY)
Admission: RE | Admit: 2011-09-30 | Discharge: 2011-09-30 | Disposition: A | Discharge status: Home / Self Care | Payer: BC Managed Care – PPO | Source: Ambulatory Visit | Attending: General Surgery | Admitting: General Surgery

## 2011-09-30 LAB — BASIC METABOLIC PANEL
CO2: 25 mEq/L (ref 19–32)
Calcium: 9.5 mg/dL (ref 8.4–10.5)
Chloride: 100 mEq/L (ref 96–112)
Sodium: 137 mEq/L (ref 135–145)

## 2011-09-30 LAB — CBC WITH DIFFERENTIAL/PLATELET
Eosinophils Relative: 2 % (ref 0–5)
HCT: 42 % (ref 36.0–46.0)
Lymphocytes Relative: 36 % (ref 12–46)
Lymphs Abs: 3.9 10*3/uL (ref 0.7–4.0)
MCV: 85.7 fL (ref 78.0–100.0)
Neutro Abs: 6.4 10*3/uL (ref 1.7–7.7)
Platelets: 356 10*3/uL (ref 150–400)
RBC: 4.9 MIL/uL (ref 3.87–5.11)
WBC: 11 10*3/uL — ABNORMAL HIGH (ref 4.0–10.5)

## 2011-09-30 LAB — SURGICAL PCR SCREEN: Staphylococcus aureus: POSITIVE — AB

## 2011-09-30 LAB — HCG, QUANTITATIVE, PREGNANCY: hCG, Beta Chain, Quant, S: <1 m[IU]/mL (ref <5)

## 2011-09-30 NOTE — Patient Instructions (Signed)
[address]  09/30/2011   Your procedure is scheduled on:  10/02/11  Report to [name] at 07:35 AM.  Call this number if you have problems the morning of surgery: [phone]   Remember:   Do not eat food or drink:After Midnight.  Take these medicines the morning of surgery with A SIP OF WATER:    Do not wear jewelry, make-up or nail polish.  Do not wear lotions, powders, or perfumes.   Do not shave 48 hours prior to surgery. Men may shave face and neck.  Do not bring valuables to the hospital.  Contacts, dentures or bridgework may not be worn into surgery.  Leave suitcase in the car. After surgery it may be brought to your room.  For patients admitted to the hospital, checkout time is 11:00 AM the day of discharge.   Patients discharged the day of surgery will not be allowed to drive home.  Special Instructions: CHG Shower Shower 2 days before surgery and 1 day before surgery with Hibiclens.   Please read over the following fact sheets that you were given: Pain Booklet, MRSA Information, Surgical Site Infection Prevention, Anesthesia Post-op Instructions and Care and Recovery After Surgery    PATIENT INSTRUCTIONS POST-ANESTHESIA  IMMEDIATELY FOLLOWING SURGERY:  Do not drive or operate machinery for the first twenty four hours after surgery.  Do not make any important decisions for twenty four hours after surgery or while taking narcotic pain medications or sedatives.  If you develop intractable nausea and vomiting or a severe headache please notify your doctor immediately.  FOLLOW-UP:  Please make an appointment with your surgeon as instructed. You do not need to follow up with anesthesia unless specifically instructed to do so.  WOUND CARE INSTRUCTIONS (if applicable):  Keep a dry clean dressing on the anesthesia/puncture wound site if there is drainage.  Once the wound has quit draining you may leave it open to air.  Generally you should leave the bandage intact for twenty  four hours unless there is drainage.  If the epidural site drains for more than 36-48 hours please call the anesthesia department.  QUESTIONS?:  Please feel free to call your physician or the hospital operator if you have any questions, and they will be happy to assist you.

## 2011-10-01 NOTE — H&P (Signed)
NTS SOAP Note  Vital Signs:  Vitals as of: 09/15/2011: Systolic 150: Diastolic 88: Heart Rate 91: Temp 96.64F: Height 29ft 4in: Weight 260Lbs 0 Ounces: BMI 45  BMI : 44.63 kg/m2  Subjective: This 21 Years 63 Months old Female presents for of swelling under her right axilla. This is been present for over one month. It has had some pain especially with palpation. She's also noticed some swelling in the left axilla which is occurred more recently as well as within the right groin. She denies any significant fevers or chills. No nausea vomiting. No similar symptomatology in the past. She was seen in the emergency department at which time she was prescribed antibiotics. She has been doing some warm soaks and has noted some drainage from the right axilla over the last couple days. She did stage with pinching the area she did express some blood. No significant odor. No sick contacts.  Review of Symptoms:  Constitutional:unremarkable   Head:unremarkable    Eyes:unremarkable   Nose/Mouth/Throat:unremarkable Cardiovascular:  unremarkable   Respiratory:unremarkable   Gastrointestinal:  unremarkable   Genitourinary:unremarkable       lower back pain boils Breast:unremarkable   Hematolgic/Lymphatic:unremarkable     Allergic/Immunologic:unremarkable     Past Medical History:  Obtained     Past Medical History  Pregnancy Gravida: 0 Pregnancy Para: 0 Surgical History: tonsils and adenoidectomy, hernia repair Medical Problems: none Psychiatric History: none Allergies: sulfas, penicillin Medications: doxycycline, NuvaRing   Social History:Obtained  Social History  Preferred Language: English ([place]) Race:  White Ethnicity: Not Hispanic / Latino Age: 21 Years 7 Months Marital Status:  S Alcohol: none Recreational drug(s): none   Smoking Status: Current every day smoker reviewed on 09/16/2011 Started Date: 01/12/2006 Packs per day:  1.00   Family History:Obtained     Family History  Is there a family history UX:NATFTDDU mellitus, hypertension    Objective Information: General:  Well appearing, well nourished in no distress.obese Skin:     no rash or prominent lesions except several areas of small skin abscesses. This includes the right axilla which demonstrates 2 small sinus openings with a scant amount of serous sanguinous discharge. There is no surrounding erythema. There is some discomfort. There is no crepitance. there is a small firm indurated area within the left axilla. Similar area in the right groin. Head:Atraumatic; no masses; no abnormalities Eyes:  conjunctiva clear, EOM intact, PERRL Throat:  no erythema, exudates or lesions. Neck:  Supple without lymphadenopathy.  Heart:  RRR, no murmur Lungs:    CTA bilaterally, no wheezes, rhonchi, rales.  Breathing unlabored. Abdomen:Soft, NT/ND, no HSM, no masses.  Assessment:    Plan: multiple abscesses. At this point she does have drainage and I do not appreciate any of the lesions which require incision and drainage at this time. This was discussed with the patient however at this time we'll continue conservative management continue the antibiotics. Unfortunately patient is allergic to sulfa as and thereforecoverage with Bactrim for a likely MRSA infection is not possible. Additionally I do have a fairly high suspicion of early hidradenitis based on the presentation and this was also discussed with the patient. Unfortunately again due to her allergies coverage with a broad-spectrum antibiotic such as Augmentin is not possible. At this point we'll continue the doxycycline.  Patient will call with any questions concerns or problems in the interim.   Follow-up:Weeks 1   Follow Up - 09/22/2011  Patient Name: Sangiovanni Date of Birth: 01-19-1990  Vital Signs:  Insert Vitals as of: 09/22/2011: Systolic 145:  Diastolic 84: Heart Rate 78: Temp 36.39C: Height 163CM: Weight 117.93KG: BMI 45   BMI: 44.63 kg/m2  Subjective: This 21 Years 65 Months old Female presents for followup. Patient has    significant complaints.Still has discharge.  No fever or chills.  Still doing warm compresses   Social History: Reviewed  Social History  Preferred Language: English ([place]) Race:  White Ethnicity: Not Hispanic / Latino Age: 21 Years 7 Months Marital Status:  S Alcohol: none Recreational drug(s): none       Allergies:  Allergies Insert Code: No allergies found.     Objective: General: Well appearing, well nourished in no distress. Axillae:  Some dischare from right.  Still indurated on left.  No erythema.    Assessment:     PO ZOX:WRUEAV  Plan:  Continue antibiotics.  continue local care.  No acute surgical indications.    Follow-up:Weeks 1    A/P update.  No significant change.  Not responding to abx.  Will plan to excise.

## 2011-10-02 ENCOUNTER — Encounter (HOSPITAL_COMMUNITY): Payer: Self-pay | Admitting: Anesthesiology

## 2011-10-02 ENCOUNTER — Encounter (HOSPITAL_COMMUNITY)
Admission: RE | Disposition: A | Discharge status: Home / Self Care | Payer: Self-pay | Source: Ambulatory Visit | Attending: General Surgery

## 2011-10-02 ENCOUNTER — Ambulatory Visit (HOSPITAL_COMMUNITY): Payer: BC Managed Care – PPO | Admitting: Anesthesiology

## 2011-10-02 ENCOUNTER — Encounter (HOSPITAL_COMMUNITY): Payer: Self-pay | Admitting: *Deleted

## 2011-10-02 ENCOUNTER — Ambulatory Visit (HOSPITAL_COMMUNITY)
Admission: RE | Admit: 2011-10-02 | Discharge: 2011-10-02 | Disposition: A | Discharge status: Home / Self Care | Payer: BC Managed Care – PPO | Source: Ambulatory Visit | Attending: General Surgery | Admitting: General Surgery

## 2011-10-02 DIAGNOSIS — L732 Hidradenitis suppurativa: Secondary | ICD-10-CM | POA: Insufficient documentation

## 2011-10-02 DIAGNOSIS — Z01812 Encounter for preprocedural laboratory examination: Secondary | ICD-10-CM | POA: Insufficient documentation

## 2011-10-02 HISTORY — PX: HYDRADENITIS EXCISION: SHX5243

## 2011-10-02 LAB — GLUCOSE, CAPILLARY: Glucose-Capillary: 123 mg/dL — ABNORMAL HIGH (ref 70–99)

## 2011-10-02 SURGERY — EXCISION, HIDRADENITIS, AXILLA
Anesthesia: General | Site: Axilla | Laterality: Right | Wound class: Contaminated

## 2011-10-02 MED ORDER — LIDOCAINE HCL (CARDIAC) 10 MG/ML IV SOLN
INTRAVENOUS | Status: DC | PRN
  Administered 2011-10-02: 50 mg via INTRAVENOUS

## 2011-10-02 MED ORDER — CIPROFLOXACIN IN D5W 400 MG/200ML IV SOLN: 400.0000 mg | INTRAVENOUS | Status: DC

## 2011-10-02 MED ORDER — MIDAZOLAM HCL 2 MG/2ML IJ SOLN
1.0000 mg | INTRAMUSCULAR | Status: DC | PRN
  Administered 2011-10-02: 2 mg via INTRAVENOUS

## 2011-10-02 MED ORDER — FENTANYL CITRATE 0.05 MG/ML IJ SOLN: 25.0000 ug | INTRAMUSCULAR | Status: DC | PRN

## 2011-10-02 MED ORDER — CHLORHEXIDINE GLUCONATE 4 % EX LIQD: 1.0000 "application " | Freq: Once | CUTANEOUS | Status: DC

## 2011-10-02 MED ORDER — GLYCOPYRROLATE 0.2 MG/ML IJ SOLN
INTRAMUSCULAR | Status: AC
  Filled 2011-10-02: qty 1

## 2011-10-02 MED ORDER — METRONIDAZOLE IN NACL 5-0.79 MG/ML-% IV SOLN
INTRAVENOUS | Status: DC | PRN
  Administered 2011-10-02: 500 mg via INTRAVENOUS

## 2011-10-02 MED ORDER — MUPIROCIN 2 % EX OINT
TOPICAL_OINTMENT | CUTANEOUS | Status: AC
  Filled 2011-10-02: qty 22

## 2011-10-02 MED ORDER — DIPHENHYDRAMINE HCL 50 MG/ML IJ SOLN
INTRAMUSCULAR | Status: DC | PRN
  Administered 2011-10-02: 50 mg via INTRAVENOUS

## 2011-10-02 MED ORDER — PROPOFOL 10 MG/ML IV EMUL
INTRAVENOUS | Status: AC
  Filled 2011-10-02: qty 20

## 2011-10-02 MED ORDER — FENTANYL CITRATE 0.05 MG/ML IJ SOLN
INTRAMUSCULAR | Status: AC
  Filled 2011-10-02: qty 2

## 2011-10-02 MED ORDER — OXYCODONE-ACETAMINOPHEN 7.5-325 MG PO TABS: 1.0000 | ORAL_TABLET | ORAL | Status: DC | PRN

## 2011-10-02 MED ORDER — ONDANSETRON HCL 4 MG/2ML IJ SOLN: 4.0000 mg | Freq: Once | INTRAMUSCULAR | Status: DC | PRN

## 2011-10-02 MED ORDER — PROPOFOL 10 MG/ML IV EMUL
INTRAVENOUS | Status: DC | PRN
  Administered 2011-10-02: 250 mg via INTRAVENOUS

## 2011-10-02 MED ORDER — FENTANYL CITRATE 0.05 MG/ML IJ SOLN
INTRAMUSCULAR | Status: DC | PRN
  Administered 2011-10-02 (×6): 50 ug via INTRAVENOUS

## 2011-10-02 MED ORDER — LACTATED RINGERS IV SOLN
INTRAVENOUS | Status: DC
  Administered 2011-10-02: 09:00:00 via INTRAVENOUS

## 2011-10-02 MED ORDER — DEXAMETHASONE SODIUM PHOSPHATE 4 MG/ML IJ SOLN
INTRAMUSCULAR | Status: DC | PRN
  Administered 2011-10-02: 4 mg via INTRAVENOUS

## 2011-10-02 MED ORDER — LIDOCAINE HCL (PF) 1 % IJ SOLN
INTRAMUSCULAR | Status: AC
  Filled 2011-10-02: qty 5

## 2011-10-02 MED ORDER — ONDANSETRON HCL 4 MG/2ML IJ SOLN
4.0000 mg | Freq: Once | INTRAMUSCULAR | Status: AC
  Administered 2011-10-02: 4 mg via INTRAVENOUS

## 2011-10-02 MED ORDER — GLYCOPYRROLATE 0.2 MG/ML IJ SOLN
0.2000 mg | Freq: Once | INTRAMUSCULAR | Status: AC
  Administered 2011-10-02: 0.2 mg via INTRAVENOUS

## 2011-10-02 MED ORDER — MIDAZOLAM HCL 2 MG/2ML IJ SOLN: 1.0000 mg | INTRAMUSCULAR | Status: DC | PRN

## 2011-10-02 MED ORDER — CIPROFLOXACIN IN D5W 400 MG/200ML IV SOLN
INTRAVENOUS | Status: AC
  Filled 2011-10-02: qty 200

## 2011-10-02 MED ORDER — METRONIDAZOLE IN NACL 5-0.79 MG/ML-% IV SOLN
INTRAVENOUS | Status: AC
  Filled 2011-10-02: qty 100

## 2011-10-02 MED ORDER — METRONIDAZOLE IN NACL 5-0.79 MG/ML-% IV SOLN: 500.0000 mg | Freq: Once | INTRAVENOUS | Status: DC

## 2011-10-02 MED ORDER — ROCURONIUM BROMIDE 50 MG/5ML IV SOLN
INTRAVENOUS | Status: AC
  Filled 2011-10-02: qty 1

## 2011-10-02 MED ORDER — ONDANSETRON HCL 4 MG/2ML IJ SOLN
INTRAMUSCULAR | Status: AC
  Filled 2011-10-02: qty 2

## 2011-10-02 MED ORDER — 0.9 % SODIUM CHLORIDE (POUR BTL) OPTIME
TOPICAL | Status: DC | PRN
  Administered 2011-10-02: 1000 mL

## 2011-10-02 MED ORDER — ARTIFICIAL TEARS OP OINT
TOPICAL_OINTMENT | OPHTHALMIC | Status: AC
  Filled 2011-10-02: qty 3.5

## 2011-10-02 MED ORDER — MIDAZOLAM HCL 2 MG/2ML IJ SOLN
INTRAMUSCULAR | Status: AC
  Filled 2011-10-02: qty 2

## 2011-10-02 MED ORDER — LACTATED RINGERS IV SOLN
INTRAVENOUS | Status: DC | PRN
  Administered 2011-10-02 (×2): via INTRAVENOUS

## 2011-10-02 MED ORDER — FENTANYL CITRATE 0.05 MG/ML IJ SOLN
25.0000 ug | INTRAMUSCULAR | Status: DC | PRN
  Administered 2011-10-02 (×2): 25 ug via INTRAVENOUS
  Administered 2011-10-02: 50 ug via INTRAVENOUS

## 2011-10-02 SURGICAL SUPPLY — 37 items
BAG HAMPER (MISCELLANEOUS) ×2 IMPLANT
BLADE SURG 15 STRL LF DISP TIS (BLADE) ×1 IMPLANT
BLADE SURG 15 STRL SS (BLADE) ×1
BNDG COHESIVE 4X5 TAN NS LF (GAUZE/BANDAGES/DRESSINGS) ×2 IMPLANT
CLOTH BEACON ORANGE TIMEOUT ST (SAFETY) ×2 IMPLANT
COVER LIGHT HANDLE STERIS (MISCELLANEOUS) ×4 IMPLANT
DRAIN PENROSE 12X.25 LTX STRL (MISCELLANEOUS) IMPLANT
ELECT REM PT RETURN 9FT ADLT (ELECTROSURGICAL) ×2
ELECTRODE REM PT RTRN 9FT ADLT (ELECTROSURGICAL) ×1 IMPLANT
FORMALIN 10 PREFIL 480ML (MISCELLANEOUS) ×2 IMPLANT
GLOVE BIOGEL PI IND STRL 7.0 (GLOVE) ×2 IMPLANT
GLOVE BIOGEL PI IND STRL 7.5 (GLOVE) ×1 IMPLANT
GLOVE BIOGEL PI INDICATOR 7.0 (GLOVE) ×2
GLOVE BIOGEL PI INDICATOR 7.5 (GLOVE) ×1
GLOVE ECLIPSE 7.0 STRL STRAW (GLOVE) ×2 IMPLANT
GLOVE EXAM NITRILE MD LF STRL (GLOVE) ×2 IMPLANT
GLOVE SS BIOGEL STRL SZ 6.5 (GLOVE) ×2 IMPLANT
GLOVE SUPERSENSE BIOGEL SZ 6.5 (GLOVE) ×2
GOWN STRL REIN XL XLG (GOWN DISPOSABLE) ×6 IMPLANT
INST SET MINOR GENERAL (KITS) ×2 IMPLANT
KIT ROOM TURNOVER APOR (KITS) ×2 IMPLANT
MANIFOLD NEPTUNE II (INSTRUMENTS) ×2 IMPLANT
PACK MINOR (CUSTOM PROCEDURE TRAY) ×2 IMPLANT
PAD ABD 5X9 TENDERSORB (GAUZE/BANDAGES/DRESSINGS) ×2 IMPLANT
PAD ARMBOARD 7.5X6 YLW CONV (MISCELLANEOUS) ×2 IMPLANT
SET BASIN LINEN APH (SET/KITS/TRAYS/PACK) ×2 IMPLANT
SPONGE GAUZE 4X4 12PLY (GAUZE/BANDAGES/DRESSINGS) ×2 IMPLANT
SPONGE LAP 18X18 X RAY DECT (DISPOSABLE) ×2 IMPLANT
STOCKINETTE IMPERVIOUS LG (DRAPES) ×2 IMPLANT
STRIP CLOSURE SKIN 1/2X4 (GAUZE/BANDAGES/DRESSINGS) ×2 IMPLANT
SUT ETHILON 3 0 FSL (SUTURE) IMPLANT
SUT PROLENE 3 0 PS 2 (SUTURE) ×2 IMPLANT
SUT PROLENE NAB BLUE 3-0 30IN (SUTURE) ×2 IMPLANT
SUT VIC AB 3-0 SH 27 (SUTURE) ×1
SUT VIC AB 3-0 SH 27X BRD (SUTURE) ×1 IMPLANT
SYR BULB IRRIGATION 50ML (SYRINGE) ×2 IMPLANT
TAPE CLOTH SURG 4X10 WHT LF (GAUZE/BANDAGES/DRESSINGS) ×2 IMPLANT

## 2011-10-02 NOTE — Anesthesia Procedure Notes (Signed)
Procedure Name: LMA Insertion Date/Time: 10/02/2011 9:18 AM Performed by: Carolyne Littles, Ved Martos L Pre-anesthesia Checklist: Patient identified, Patient being monitored, Emergency Drugs available, Timeout performed and Suction available Patient Re-evaluated:Patient Re-evaluated prior to inductionOxygen Delivery Method: Circle system utilized Preoxygenation: Pre-oxygenation with 100% oxygen Intubation Type: IV induction Ventilation: Mask ventilation without difficulty LMA: LMA inserted LMA Size: 4.0 Number of attempts: 1 Placement Confirmation: positive ETCO2 and breath sounds checked- equal and bilateral Tube secured with: Tape Dental Injury: Teeth and Oropharynx as per pre-operative assessment

## 2011-10-02 NOTE — Op Note (Signed)
Patient:  Kathleen Velasquez  DOB:  Jan 23, 1990  MRN:  [id number]   Preop Diagnosis:  Hidradenitis right axilla  Postop Diagnosis:  Same  Procedure:  Excision of right axillary tissue  Surgeon:  Dr. Tilford Pillar  Anes:  General endotracheal, 0.5% Sensorcaine plain for local  Indications:  Patient is a 21 year old female who presented my office with a history of multiple infections on her right and left axilla. The right axilla/worse and on evaluation was consistent with hidradenitis. Patient failed medical management and discussion was undertaken regarding excision of this area. Risks benefits alternatives were discussed at length with the patient including but not limited to risk of bleeding, infection, recurrence. Patient's questions and concerns were addressed the patient was consented for planned procedure.  Procedure note:  Patient was taken to the or space the supine position the or table time the general anesthetic is a Optician, dispensing. Once patient was asleep she symmetrically intubated by the nurse anesthetist. At this point her right axilla is prepped with Betadine solution and draped in standard fashion. An elliptical incision was created around the axillary sinus tracts with additional dissection down through the subcuticular tissue carried out using electrocautery. Care was taken to excise all abnormal-appearing tissue with exposure of healthy-appearing axillary adipose tissue. Hemostasis was obtained with electrocautery. The procedure. Upon excision of the axillary tissue circumferentially it was placed in the back table sent as a permanent specimen to pathology. Hemostasis was excellent. The wound is irrigated. I did place a quarter-inch Penrose drain into the wound. This was secured to the skin with a 3-0 Prolene. A 3-0 Prolene sutures were then utilized in simple interrupted fashion to reapproximate the skin edges. The skin was washed dried moist dry towel. Sterile dressing was placed. The  drapes removed the dressings were secured. The patient left come out of general anesthetic and stretcher the PACU in stable condition. At the conclusion of procedure all instrument, sponge, needle counts are correct. Patient tolerated procedure extremely well.  Complications:  None apparent  EBL:  Less than 100 ML's  Specimen:  Right axillary tissue.

## 2011-10-02 NOTE — Transfer of Care (Signed)
  Anesthesia Post-op Note  Patient: Kathleen Velasquez  Procedure(s) Performed: Procedure(s) (LRB) with comments: EXCISION HYDRADENITIS AXILLA (Right) - Right Axillary Dissection  Patient Location: PACU  Anesthesia Type: General  Level of Consciousness: awake, alert , oriented and patient cooperative  Airway and Oxygen Therapy: Patient Spontanous Breathing and Patient connected to face mask oxygen  Post-op Pain: mild  Post-op Assessment: Post-op Vital signs reviewed, Patient's Cardiovascular Status Stable, Respiratory Function Stable, Patent Airway and No signs of Nausea or vomiting  Post-op Vital Signs: Reviewed and stable  Complications: No apparent anesthesia complications

## 2011-10-02 NOTE — Interval H&P Note (Signed)
History and Physical Interval Note:  10/02/2011 8:55 AM  Kathleen Velasquez  has presented today for surgery, with the diagnosis of Hidradentitis right axilla  The various methods of treatment have been discussed with the patient and family. After consideration of risks, benefits and other options for treatment, the patient has consented to  Procedure(s) (LRB) with comments: EXCISION HYDRADENITIS AXILLA (Right) as a surgical intervention .  The patient's history has been reviewed, patient examined, no change in status, stable for surgery.  I have reviewed the patient's chart and labs.  Questions were answered to the patient's satisfaction.     Girard Koontz C

## 2011-10-02 NOTE — Anesthesia Postprocedure Evaluation (Signed)
  Anesthesia Post-op Note  Patient: Kathleen Velasquez  Procedure(s) Performed: Procedure(s) (LRB) with comments: EXCISION HYDRADENITIS AXILLA (Right) - Right Axillary Dissection  Patient Location: PACU  Anesthesia Type: General  Level of Consciousness: awake, alert , oriented and patient cooperative  Airway and Oxygen Therapy: Patient Spontanous Breathing and Patient connected to face mask oxygen  Post-op Pain: mild  Post-op AssessmentVital signs stable  Post-op Vital Signs: Reviewed and stable  Complications: No apparent anesthesia complications

## 2011-10-02 NOTE — Anesthesia Preprocedure Evaluation (Signed)
Anesthesia Evaluation  Patient identified by MRN, date of birth, ID band Patient awake    Reviewed: Allergy & Precautions, H&P , NPO status , Patient's Chart, lab work & pertinent test results  History of Anesthesia Complications Negative for: history of anesthetic complications  Airway Mallampati: III TM Distance: >3 FB Neck ROM: Full  Mouth opening: Limited Mouth Opening  Dental  (+) Poor Dentition, Chipped, Missing and Dental Advisory Given   Pulmonary Current Smoker, PE: am cough. breath sounds clear to auscultation        Cardiovascular negative cardio ROS  Rhythm:Regular     Neuro/Psych    GI/Hepatic   Endo/Other  Morbid obesity  Renal/GU      Musculoskeletal   Abdominal   Peds  Hematology   Anesthesia Other Findings   Reproductive/Obstetrics                           Anesthesia Physical Anesthesia Plan  ASA: II  Anesthesia Plan: General   Post-op Pain Management:    Induction: Intravenous  Airway Management Planned: LMA  Additional Equipment:   Intra-op Plan:   Post-operative Plan: Extubation in OR  Informed Consent: I have reviewed the patients History and Physical, chart, labs and discussed the procedure including the risks, benefits and alternatives for the proposed anesthesia with the patient or authorized representative who has indicated his/her understanding and acceptance.   Dental advisory given  Plan Discussed with:   Anesthesia Plan Comments: (Possible GOT discussed if LMA not satisfactory, and pt agrees with plan.)        Anesthesia Quick Evaluation

## 2011-10-06 ENCOUNTER — Encounter (HOSPITAL_COMMUNITY): Payer: Self-pay | Admitting: General Surgery

## 2011-10-07 ENCOUNTER — Encounter (HOSPITAL_COMMUNITY): Payer: Self-pay | Admitting: Emergency Medicine

## 2011-10-07 ENCOUNTER — Emergency Department (HOSPITAL_COMMUNITY)
Admission: EM | Admit: 2011-10-07 | Discharge: 2011-10-07 | Disposition: A | Discharge status: Home / Self Care | Payer: BC Managed Care – PPO | Attending: Emergency Medicine | Admitting: Emergency Medicine

## 2011-10-07 DIAGNOSIS — L732 Hidradenitis suppurativa: Secondary | ICD-10-CM | POA: Insufficient documentation

## 2011-10-07 DIAGNOSIS — F17200 Nicotine dependence, unspecified, uncomplicated: Secondary | ICD-10-CM | POA: Insufficient documentation

## 2011-10-07 DIAGNOSIS — R21 Rash and other nonspecific skin eruption: Secondary | ICD-10-CM | POA: Insufficient documentation

## 2011-10-07 DIAGNOSIS — Z880 Allergy status to penicillin: Secondary | ICD-10-CM | POA: Insufficient documentation

## 2011-10-07 DIAGNOSIS — G8918 Other acute postprocedural pain: Secondary | ICD-10-CM

## 2011-10-07 MED ORDER — TRAMADOL HCL 50 MG PO TABS: ORAL_TABLET | ORAL | Status: DC

## 2011-10-07 MED ORDER — TRAMADOL HCL 50 MG PO TABS
100.0000 mg | ORAL_TABLET | Freq: Once | ORAL | Status: AC
  Administered 2011-10-07: 100 mg via ORAL
  Filled 2011-10-07: qty 2

## 2011-10-07 MED ORDER — DIPHENHYDRAMINE HCL 25 MG PO CAPS
50.0000 mg | ORAL_CAPSULE | Freq: Once | ORAL | Status: AC
  Administered 2011-10-07: 50 mg via ORAL
  Filled 2011-10-07: qty 2

## 2011-10-07 MED ORDER — ONDANSETRON HCL 4 MG PO TABS: 4.0000 mg | ORAL_TABLET | Freq: Three times a day (TID) | ORAL | Status: DC | PRN

## 2011-10-07 MED ORDER — ONDANSETRON 8 MG PO TBDP
8.0000 mg | ORAL_TABLET | Freq: Once | ORAL | Status: AC
Start: 2011-10-07 — End: 2011-10-07
  Administered 2011-10-07: 8 mg via ORAL
  Filled 2011-10-07: qty 1

## 2011-10-07 MED ORDER — TRAMADOL HCL 50 MG PO TABS
ORAL_TABLET | ORAL | Status: AC
  Filled 2011-10-07: qty 1

## 2011-10-07 NOTE — ED Provider Notes (Cosign Needed)
History    This chart was scribed for Ward Givens, MD, MD by Smitty Pluck. The patient was seen in room APA03 and the patient's care was started at 5:27PM.   CSN: [id number]  Arrival date & time 10/07/11  1514      Chief Complaint  Patient presents with  . Wound Check    (Consider location/radiation/quality/duration/timing/severity/associated sxs/prior treatment) Patient is a 21 y.o. female presenting with wound check. The history is provided by the patient. No language interpreter was used.  Wound Check    Kathleen Velasquez is a 21 y.o. female who presents to the Emergency Department complaining of wound recheck. Pt reports that she had surgery by Dr. Caesar Bookman for cysts removal from right arm. She reports having hx of cysts. Pt now has rash at site of surgery that itches and burns. She reports that the incision burns and itches. She reports having penrose drain with green tint. The drainage was brown right after surgery. She reports that the drainage has foul odor. Pt reports nausea, fever and chills. She reports that she has taken hydrocodone 5-325.   Pt goes to [facility]   Past Medical History  Diagnosis Date  . Abscess     Past Surgical History  Procedure Date  . Hernia repair   . Tonsillectomy   . Adenoidectomy   . Hydradenitis excision 10/02/2011    Procedure: EXCISION HYDRADENITIS AXILLA;  Surgeon: Fabio Bering, MD;  Location: [facility];  Service: General;  Laterality: Right;  Right Axillary Dissection    History reviewed. No pertinent family history.  History  Substance Use Topics  . Smoking status: Current Every Day Smoker -- 1.0 packs/day for 5 years    Types: Cigarettes  . Smokeless tobacco: Never Used  . Alcohol Use: Yes     social use    OB History    Grav Para Term Preterm Abortions TAB SAB Ect Mult Living                  Review of Systems  Constitutional: Positive for fever and chills.  Gastrointestinal: Positive for nausea.  All other  systems reviewed and are negative.    Allergies  Bactrim; Ciprofloxacin; Other; Penicillins; and Percocet  Home Medications   Current Outpatient Rx  Name Route Sig Dispense Refill  . DOXYCYCLINE HYCLATE 100 MG PO TABS Oral Take 100 mg by mouth 2 (two) times daily.    . ETONOGESTREL-ETHINYL ESTRADIOL 0.12-0.015 MG/24HR VA RING Vaginal Place 1 each vaginally every 28 (twenty-eight) days. Insert vaginally and leave in place for 3 consecutive weeks, then remove for 1 week.    . IBUPROFEN 200 MG PO TABS Oral Take 800 mg by mouth every 6 (six) hours as needed. For pain    . OXYCODONE-ACETAMINOPHEN 7.5-325 MG PO TABS Oral Take 1-2 tablets by mouth every 4 (four) hours as needed for pain. 45 tablet 0    BP 129/67  Pulse 87  Temp 98.6 F (37 C) (Oral)  Resp 17  Ht 5\' 4"  (1.626 m)  Wt 257 lb (116.574 kg)  BMI 44.11 kg/m2  SpO2 97%  LMP 09/25/2011  Vital signs normal    Physical Exam  Nursing note and vitals reviewed. Constitutional: She is oriented to person, place, and time. She appears well-developed and well-nourished.  Non-toxic appearance. She does not appear ill. No distress.       Obese   HENT:  Head: Normocephalic and atraumatic.  Right Ear: External ear normal.  Left Ear: External ear normal.  Nose: Nose normal. No mucosal edema or rhinorrhea.  Mouth/Throat: Oropharynx is clear and moist and mucous membranes are normal. No dental abscesses or uvula swelling.  Eyes: Conjunctivae normal and EOM are normal. Pupils are equal, round, and reactive to light.  Neck: Normal range of motion and full passive range of motion without pain. Neck supple.  Cardiovascular: Normal rate, regular rhythm and normal heart sounds.  Exam reveals no gallop and no friction rub.   No murmur heard. Pulmonary/Chest: Effort normal. No respiratory distress. She has no rhonchi. She exhibits no crepitus.  Abdominal: Normal appearance.  Musculoskeletal: Normal range of motion. She exhibits no edema and  no tenderness.       Moves all extremities well.   Neurological: She is alert and oriented to person, place, and time. She has normal strength. No cranial nerve deficit.  Skin: Skin is warm and dry.       LONG INCISION IN RIGHT AXILLA WITH PENROSE DRAIN POSTERIORLY GAP BETWEEN SUTURES WHERE WOUND IS DEHISCED RED RASH IN OUTER RECTANGULAR AREA CONSISTENT WITH WHERE TAP WAS PLACED, SLIGHTLY RAISED BUT NO OPEN WOUNDS  Foul odor   Psychiatric: She has a normal mood and affect. Her speech is normal and behavior is normal. Her mood appears not anxious.    ED Course  Procedures (including critical care time) DIAGNOSTIC STUDIES: Oxygen Saturation is 97% on room air, normal by my interpretation.    COORDINATION OF CARE: 5:34 PM Discussed ED treatment with pt  5:47 PM Ordered:   Medications  diphenhydrAMINE (BENADRYL) capsule 50 mg (50 mg Oral Given 10/07/11 1836)  ondansetron (ZOFRAN-ODT) disintegrating tablet 8 mg (8 mg Oral Given 10/07/11 1836)  traMADol (ULTRAM) tablet 100 mg (100 mg Oral Given 10/07/11 1836)    5:50PM Consulted Dr. Caesar Bookman (surgeon). Discussed pt rash and wound drainage. He will see her in his office tomorrow. He states not to change abx due to her multiple drug allergies.    1. Post-operative pain   Tape sensitivity with rash/local reaction  New Prescriptions   ONDANSETRON (ZOFRAN) 4 MG TABLET    Take 1 tablet (4 mg total) by mouth every 8 (eight) hours as needed for nausea.   TRAMADOL (ULTRAM) 50 MG TABLET    Take 1 or 2 po Q 6hrs for pain    Plan discharge  Devoria Albe, MD, FACEP     MDM    I personally performed the services described in this documentation, which was scribed in my presence. The recorded information has been reviewed and considered.  Devoria Albe, MD, FACEP    Ward Givens, MD 10/07/11 [phone]

## 2011-10-07 NOTE — ED Notes (Signed)
Discharge instructions reviewed with pt, questions answered. Pt verbalized understanding.  

## 2011-10-07 NOTE — ED Notes (Signed)
Pt had a cyst removed from her rt armpit by dr. Caesar Bookman. Pt states she has been running a fever, rash around incision, foul gray-green drainage and pt states she has a penrose drain in place. Pt states she feels sick and dizzy.

## 2012-01-25 ENCOUNTER — Encounter (HOSPITAL_COMMUNITY): Payer: Self-pay

## 2012-01-25 ENCOUNTER — Inpatient Hospital Stay (HOSPITAL_COMMUNITY)
Admission: EM | Admit: 2012-01-25 | Discharge: 2012-01-29 | DRG: 494 | Disposition: A | Discharge status: Home / Self Care | Payer: BC Managed Care – PPO | Attending: General Surgery | Admitting: General Surgery

## 2012-01-25 DIAGNOSIS — Z6841 Body Mass Index (BMI) 40.0 and over, adult: Secondary | ICD-10-CM

## 2012-01-25 DIAGNOSIS — K8020 Calculus of gallbladder without cholecystitis without obstruction: Secondary | ICD-10-CM

## 2012-01-25 DIAGNOSIS — F17200 Nicotine dependence, unspecified, uncomplicated: Secondary | ICD-10-CM | POA: Diagnosis present

## 2012-01-25 DIAGNOSIS — Z880 Allergy status to penicillin: Secondary | ICD-10-CM

## 2012-01-25 DIAGNOSIS — Z885 Allergy status to narcotic agent status: Secondary | ICD-10-CM

## 2012-01-25 DIAGNOSIS — Z881 Allergy status to other antibiotic agents status: Secondary | ICD-10-CM

## 2012-01-25 LAB — URINE MICROSCOPIC-ADD ON

## 2012-01-25 LAB — CBC
Hemoglobin: 13.7 g/dL (ref 12.0–15.0)
MCH: 29.3 pg (ref 26.0–34.0)
MCHC: 33.4 g/dL (ref 30.0–36.0)
RDW: 15 % (ref 11.5–15.5)

## 2012-01-25 LAB — COMPREHENSIVE METABOLIC PANEL
ALT: 23 U/L (ref 0–35)
Albumin: 3.8 g/dL (ref 3.5–5.2)
Alkaline Phosphatase: 84 U/L (ref 39–117)
BUN: 5 mg/dL — ABNORMAL LOW (ref 6–23)
Calcium: 8.9 mg/dL (ref 8.4–10.5)
GFR calc Af Amer: >90 mL/min (ref >90)
Glucose, Bld: 87 mg/dL (ref 70–99)
Potassium: 3.7 mEq/L (ref 3.5–5.1)
Sodium: 138 mEq/L (ref 135–145)
Total Protein: 7.1 g/dL (ref 6.0–8.3)

## 2012-01-25 LAB — URINALYSIS, ROUTINE W REFLEX MICROSCOPIC
Nitrite: NEGATIVE
Specific Gravity, Urine: 1.01 (ref 1.005–1.030)
Urobilinogen, UA: 0.2 mg/dL (ref 0.0–1.0)
pH: 7 (ref 5.0–8.0)

## 2012-01-25 LAB — PREGNANCY, URINE: Preg Test, Ur: NEGATIVE

## 2012-01-25 LAB — LIPASE, BLOOD: Lipase: 20 U/L (ref 11–59)

## 2012-01-25 MED ORDER — ENOXAPARIN SODIUM 40 MG/0.4ML ~~LOC~~ SOLN
40.0000 mg | SUBCUTANEOUS | Status: DC
  Administered 2012-01-25 – 2012-01-28 (×4): 40 mg via SUBCUTANEOUS
  Filled 2012-01-25 (×4): qty 0.4

## 2012-01-25 MED ORDER — PANTOPRAZOLE SODIUM 40 MG IV SOLR
40.0000 mg | Freq: Every day | INTRAVENOUS | Status: DC
  Administered 2012-01-26 – 2012-01-28 (×3): 40 mg via INTRAVENOUS
  Filled 2012-01-25 (×3): qty 40

## 2012-01-25 MED ORDER — HYDROMORPHONE HCL PF 1 MG/ML IJ SOLN
1.0000 mg | INTRAMUSCULAR | Status: DC | PRN
  Administered 2012-01-26: 1 mg via INTRAVENOUS
  Administered 2012-01-26: 2 mg via INTRAVENOUS
  Administered 2012-01-26: 1 mg via INTRAVENOUS
  Filled 2012-01-25 (×2): qty 1
  Filled 2012-01-25: qty 2

## 2012-01-25 MED ORDER — HYDROMORPHONE HCL PF 1 MG/ML IJ SOLN
1.0000 mg | Freq: Once | INTRAMUSCULAR | Status: AC
  Administered 2012-01-25: 1 mg via INTRAVENOUS
  Filled 2012-01-25: qty 1

## 2012-01-25 MED ORDER — ONDANSETRON HCL 4 MG/2ML IJ SOLN
4.0000 mg | Freq: Once | INTRAMUSCULAR | Status: AC
  Administered 2012-01-25: 4 mg via INTRAVENOUS
  Filled 2012-01-25: qty 2

## 2012-01-25 MED ORDER — SODIUM CHLORIDE 0.9 % IV SOLN
Freq: Once | INTRAVENOUS | Status: AC
  Administered 2012-01-25: 20:00:00 via INTRAVENOUS

## 2012-01-25 MED ORDER — PANTOPRAZOLE SODIUM 40 MG IV SOLR
40.0000 mg | Freq: Once | INTRAVENOUS | Status: AC
  Administered 2012-01-25: 40 mg via INTRAVENOUS
  Filled 2012-01-25: qty 40

## 2012-01-25 MED ORDER — ONDANSETRON HCL 4 MG/2ML IJ SOLN
4.0000 mg | Freq: Four times a day (QID) | INTRAMUSCULAR | Status: DC | PRN
  Administered 2012-01-26 – 2012-01-28 (×6): 4 mg via INTRAVENOUS
  Filled 2012-01-25 (×6): qty 2

## 2012-01-25 MED ORDER — OXYCODONE-ACETAMINOPHEN 5-325 MG PO TABS: 1.0000 | ORAL_TABLET | Freq: Four times a day (QID) | ORAL | Status: AC | PRN

## 2012-01-25 MED ORDER — PREDNISONE 10 MG PO TABS: 20.0000 mg | ORAL_TABLET | Freq: Every day | ORAL | Status: DC

## 2012-01-25 MED ORDER — LACTATED RINGERS IV SOLN
INTRAVENOUS | Status: DC
  Administered 2012-01-25 – 2012-01-27 (×4): via INTRAVENOUS

## 2012-01-25 MED ORDER — HYDROMORPHONE HCL PF 1 MG/ML IJ SOLN
1.0000 mg | Freq: Once | INTRAMUSCULAR | Status: AC
  Administered 2012-01-25 – 2012-01-26 (×2): 1 mg via INTRAVENOUS
  Filled 2012-01-25: qty 1

## 2012-01-25 NOTE — ED Notes (Signed)
Patient lying in bed crying stating "it hurts so bad. The medicine helped for a little bit but it is hurting again." Patient rates pain level at a 10. MD aware.

## 2012-01-25 NOTE — ED Notes (Signed)
Seen @ [facility] 01/23/11 for URQ pain, told see needed her gallbladder removed, now onset of fever this afternoon and increased pain/nausea

## 2012-01-25 NOTE — ED Notes (Signed)
Attempted to call report, was told the nurse would call me back

## 2012-01-25 NOTE — ED Provider Notes (Addendum)
History  This chart was scribed for Benny Lennert, MD by Erskine Emery, ED Scribe. This patient was seen in room APA19/APA19 and the patient's care was started at 19:20.   CSN: [id number]  Arrival date & time 01/25/12  1842   First MD Initiated Contact with Patient 01/25/12 1920      Chief Complaint  Patient presents with  . Fever    (Consider location/radiation/quality/duration/timing/severity/associated sxs/prior Treatment) Kathleen Velasquez is a 22 y.o. female who presents to the Emergency Department complaining of fever, back pain that radiates around to the abdomen, emesis, and nausea since 9am this morning. Pt was seen at [facility] on Saturday (3 days ago) for back pain, where they found she had a 2.5 cm gallstone. Pt called the surgeon today but she cannot afford to see him. Pt reports some associated less frequent urination and pain upon taking a deep breath. Patient is a 22 y.o. female presenting with back pain. The history is provided by the patient. No language interpreter was used.  Back Pain  This is a recurrent problem. The current episode started more than 2 days ago. The problem occurs constantly. The problem has been gradually worsening. Associated with: gall stones. The pain is present in the lumbar spine. The quality of the pain is described as shooting. The pain is moderate. The symptoms are aggravated by certain positions. The pain is the same all the time. Associated symptoms include a fever and abdominal pain. Pertinent negatives include no chest pain, no weight loss, no headaches, no bowel incontinence, no bladder incontinence, no leg pain and no weakness. She has tried nothing for the symptoms. The treatment provided no relief. Risk factors include obesity.    Past Medical History  Diagnosis Date  . Abscess     Past Surgical History  Procedure Date  . Hernia repair   . Tonsillectomy   . Adenoidectomy   . Hydradenitis excision 10/02/2011    Procedure: EXCISION  HYDRADENITIS AXILLA;  Surgeon: Fabio Bering, MD;  Location: [facility];  Service: General;  Laterality: Right;  Right Axillary Dissection    No family history on file.  History  Substance Use Topics  . Smoking status: Current Every Day Smoker -- 1.0 packs/day for 5 years    Types: Cigarettes  . Smokeless tobacco: Never Used  . Alcohol Use: Yes     Comment: social use    OB History    Grav Para Term Preterm Abortions TAB SAB Ect Mult Living                  Review of Systems  Constitutional: Positive for fever. Negative for weight loss and fatigue.  HENT: Negative for congestion, sinus pressure and ear discharge.   Eyes: Negative for discharge.  Respiratory: Negative for cough.   Cardiovascular: Negative for chest pain.  Gastrointestinal: Positive for nausea, vomiting and abdominal pain. Negative for diarrhea and bowel incontinence.  Genitourinary: Negative for bladder incontinence, frequency and hematuria.       Decreased urine output  Musculoskeletal: Positive for back pain.  Skin: Negative for rash.  Neurological: Negative for seizures, weakness and headaches.  Hematological: Negative.   Psychiatric/Behavioral: Negative for hallucinations.  All other systems reviewed and are negative.    Allergies  Bactrim; Ciprofloxacin; Other; Penicillins; and Percocet  Home Medications   Current Outpatient Rx  Name  Route  Sig  Dispense  Refill  . HYDROCODONE-ACETAMINOPHEN 5-325 MG PO TABS   Oral   Take 1-2 tablets  by mouth every 4 (four) hours as needed. For pain         . IBUPROFEN 200 MG PO TABS   Oral   Take 800 mg by mouth every 6 (six) hours as needed. For pain         . DOXYCYCLINE HYCLATE 100 MG PO TABS   Oral   Take 100 mg by mouth 2 (two) times daily.         . ETONOGESTREL-ETHINYL ESTRADIOL 0.12-0.015 MG/24HR VA RING   Vaginal   Place 1 each vaginally every 28 (twenty-eight) days. Insert vaginally and leave in place for 3 consecutive weeks, then remove  for 1 week.         Marland Kitchen ONDANSETRON HCL 4 MG PO TABS   Oral   Take 1 tablet (4 mg total) by mouth every 8 (eight) hours as needed for nausea.   12 tablet   0   . TRAMADOL HCL 50 MG PO TABS      Take 1 or 2 po Q 6hrs for pain   20 tablet   0     Triage Vitals: BP 138/79  Pulse 113  Temp 101.3 F (38.5 C) (Oral)  Resp 20  Ht 5\' 3"  (1.6 m)  Wt 260 lb (117.935 kg)  BMI 46.06 kg/m2  SpO2 100%  LMP 01/12/2012  Physical Exam  Nursing note and vitals reviewed. Constitutional: She is oriented to person, place, and time. She appears well-developed.  HENT:  Head: Normocephalic and atraumatic.  Eyes: Conjunctivae normal and EOM are normal. No scleral icterus.  Neck: Neck supple. No thyromegaly present.  Cardiovascular: Normal rate and regular rhythm.  Exam reveals no gallop and no friction rub.   No murmur heard. Pulmonary/Chest: No stridor. She has no wheezes. She has no rales. She exhibits no tenderness.  Abdominal: She exhibits no distension. There is tenderness. There is no rebound.       Moderate RUQ tenderness.  Musculoskeletal: Normal range of motion. She exhibits no edema.  Lymphadenopathy:    She has no cervical adenopathy.  Neurological: She is oriented to person, place, and time. Coordination normal.  Skin: No rash noted. No erythema.  Psychiatric: She has a normal mood and affect. Her behavior is normal.    ED Course  Procedures (including critical care time) DIAGNOSTIC STUDIES: Oxygen Saturation is 100% on room air, normal by my interpretation.    COORDINATION OF CARE: 19:26--I evaluated the patient and we discussed a treatment plan including medication and blood work to which the pt agreed.    Labs Reviewed - No data to display No results found.   No diagnosis found.    MDM  Admit for gall stones for dr. ziegler    The chart was scribed for me under my direct supervision.  I personally performed the history, physical, and medical decision making  and all procedures in the evaluation of this patient.Benny Lennert, MD 01/25/12 2017  Benny Lennert, MD 01/25/12 2155

## 2012-01-26 LAB — CBC
Hemoglobin: 12.3 g/dL (ref 12.0–15.0)
MCHC: 32.4 g/dL (ref 30.0–36.0)
RDW: 15.3 % (ref 11.5–15.5)
WBC: 6.9 10*3/uL (ref 4.0–10.5)

## 2012-01-26 LAB — BASIC METABOLIC PANEL
BUN: 7 mg/dL (ref 6–23)
Chloride: 102 mEq/L (ref 96–112)
Creatinine, Ser: 0.73 mg/dL (ref 0.50–1.10)
GFR calc Af Amer: >90 mL/min (ref >90)
GFR calc non Af Amer: >90 mL/min (ref >90)
Potassium: 3.8 mEq/L (ref 3.5–5.1)

## 2012-01-26 LAB — SURGICAL PCR SCREEN
MRSA, PCR: POSITIVE — AB
Staphylococcus aureus: POSITIVE — AB

## 2012-01-26 MED ORDER — CHLORHEXIDINE GLUCONATE CLOTH 2 % EX PADS: 6.0000 | MEDICATED_PAD | Freq: Every day | CUTANEOUS | Status: DC

## 2012-01-26 MED ORDER — MUPIROCIN 2 % EX OINT
1.0000 "application " | TOPICAL_OINTMENT | Freq: Two times a day (BID) | CUTANEOUS | Status: DC
  Administered 2012-01-26 – 2012-01-29 (×5): 1 via NASAL
  Filled 2012-01-26: qty 22

## 2012-01-26 MED ORDER — CHLORHEXIDINE GLUCONATE CLOTH 2 % EX PADS
6.0000 | MEDICATED_PAD | Freq: Every day | CUTANEOUS | Status: DC
  Administered 2012-01-27 – 2012-01-29 (×3): 6 via TOPICAL

## 2012-01-26 MED ORDER — NICOTINE 21 MG/24HR TD PT24
21.0000 mg | MEDICATED_PATCH | Freq: Every day | TRANSDERMAL | Status: DC
  Administered 2012-01-26: 21 mg via TRANSDERMAL
  Filled 2012-01-26 (×4): qty 1

## 2012-01-26 MED ORDER — MUPIROCIN 2 % EX OINT: 1.0000 "application " | TOPICAL_OINTMENT | Freq: Two times a day (BID) | CUTANEOUS | Status: DC

## 2012-01-26 MED ORDER — ACETAMINOPHEN 650 MG RE SUPP
650.0000 mg | Freq: Once | RECTAL | Status: AC
Start: 2012-01-26 — End: 2012-01-26
  Administered 2012-01-26: 650 mg via RECTAL
  Filled 2012-01-26: qty 1

## 2012-01-26 MED ORDER — HYDROMORPHONE HCL PF 1 MG/ML IJ SOLN
1.0000 mg | INTRAMUSCULAR | Status: DC | PRN
  Administered 2012-01-26: 2 mg via INTRAVENOUS
  Administered 2012-01-26: 1 mg via INTRAVENOUS
  Administered 2012-01-26 – 2012-01-27 (×2): 2 mg via INTRAVENOUS
  Filled 2012-01-26 (×3): qty 2

## 2012-01-26 MED ORDER — HYDROMORPHONE HCL PF 1 MG/ML IJ SOLN
INTRAMUSCULAR | Status: AC
  Administered 2012-01-26: 1 mg via INTRAVENOUS
  Filled 2012-01-26: qty 2

## 2012-01-26 NOTE — H&P (Signed)
Kathleen Velasquez is an 22 y.o. female.   Chief Complaint: Epigastric abdominal pain HPI: Patient presents with approximately 7 month history of increasing epigastric and right-sided back pain. She's had extensive workup as an outpatient at [facility]. Workup was suggestive of cholelithiasis. She was advised to have surgical followup however her recent episode prevented an outpatient office visit. Due to increasing epigastric abdominal pain she presented to the emergency department. Again she has been having intermittent symptomatology over the last 7 months. She has had associated nausea occasionally emesis. Emesis is been nonbloody. No history of jaundice. Positive family history of biliary disease. No change in bowel movements. No melena or hematochezia.  Past Medical History  Diagnosis Date  . Abscess     Past Surgical History  Procedure Date  . Hernia repair   . Tonsillectomy   . Adenoidectomy   . Hydradenitis excision 10/02/2011    Procedure: EXCISION HYDRADENITIS AXILLA;  Surgeon: Fabio Bering, MD;  Location: [facility];  Service: General;  Laterality: Right;  Right Axillary Dissection    No family history on file. Social History:  reports that she has been smoking Cigarettes.  She has a 5 pack-year smoking history. She has never used smokeless tobacco. She reports that she drinks alcohol. She reports that she does not use illicit drugs.  Allergies:  Allergies  Allergen Reactions  . Bactrim Hives and Itching  . Ciprofloxacin Hives and Itching    CRNA Discontinued preop antibiotic  IVPB  Cipro in OR after developed itching and hives left arm.  Received Benadryl with relief noted. Dr.Gonzalez and Dr. Leticia Penna informed. DDallasRN  . Other Other (See Comments)    [place] causes a hives, swelling, itching, and anaphylaxis.   Marland Kitchen Penicillins Hives and Itching  . Percocet (Oxycodone-Acetaminophen) Other (See Comments)    Face flushes    Medications Prior to Admission    Medication Sig Dispense Refill  . HYDROcodone-acetaminophen (NORCO/VICODIN) 5-325 MG per tablet Take 1-2 tablets by mouth every 4 (four) hours as needed. For pain      . ibuprofen (ADVIL,MOTRIN) 200 MG tablet Take 800 mg by mouth every 6 (six) hours as needed. For pain      . etonogestrel-ethinyl estradiol (NUVARING) 0.12-0.015 MG/24HR vaginal ring Place 1 each vaginally every 28 (twenty-eight) days. Insert vaginally and leave in place for 3 consecutive weeks, then remove for 1 week.        Results for orders placed during the hospital encounter of 01/25/12 (from the past 48 hour(s))  CBC     Status: Normal   Collection Time   01/25/12  7:27 PM      Component Value Range Comment   WBC 7.8  4.0 - 10.5 K/uL    RBC 4.67  3.87 - 5.11 MIL/uL    Hemoglobin 13.7  12.0 - 15.0 g/dL    HCT 16.1  09.6 - 04.5 %    MCV 87.8  78.0 - 100.0 fL    MCH 29.3  26.0 - 34.0 pg    MCHC 33.4  30.0 - 36.0 g/dL    RDW 40.9  81.1 - 91.4 %    Platelets 287  150 - 400 K/uL   COMPREHENSIVE METABOLIC PANEL     Status: Abnormal   Collection Time   01/25/12  7:27 PM      Component Value Range Comment   Sodium 138  135 - 145 mEq/L    Potassium 3.7  3.5 - 5.1 mEq/L  Chloride 101  96 - 112 mEq/L    CO2 27  19 - 32 mEq/L    Glucose, Bld 87  70 - 99 mg/dL    BUN 5 (*) 6 - 23 mg/dL    Creatinine, Ser 0.98  0.50 - 1.10 mg/dL    Calcium 8.9  8.4 - 11.9 mg/dL    Total Protein 7.1  6.0 - 8.3 g/dL    Albumin 3.8  3.5 - 5.2 g/dL    AST 23  0 - 37 U/L    ALT 23  0 - 35 U/L    Alkaline Phosphatase 84  39 - 117 U/L    Total Bilirubin 0.1 (*) 0.3 - 1.2 mg/dL    GFR calc non Af Amer >90  >90 mL/min    GFR calc Af Amer >90  >90 mL/min   LIPASE, BLOOD     Status: Normal   Collection Time   01/25/12  7:27 PM      Component Value Range Comment   Lipase 20  11 - 59 U/L   URINALYSIS, ROUTINE W REFLEX MICROSCOPIC     Status: Abnormal   Collection Time   01/25/12  7:49 PM      Component Value Range Comment   Color, Urine  YELLOW  YELLOW    APPearance CLEAR  CLEAR    Specific Gravity, Urine 1.010  1.005 - 1.030    pH 7.0  5.0 - 8.0    Glucose, UA NEGATIVE  NEGATIVE mg/dL    Hgb urine dipstick SMALL (*) NEGATIVE    Bilirubin Urine NEGATIVE  NEGATIVE    Ketones, ur NEGATIVE  NEGATIVE mg/dL    Protein, ur NEGATIVE  NEGATIVE mg/dL    Urobilinogen, UA 0.2  0.0 - 1.0 mg/dL    Nitrite NEGATIVE  NEGATIVE    Leukocytes, UA NEGATIVE  NEGATIVE   PREGNANCY, URINE     Status: Normal   Collection Time   01/25/12  7:49 PM      Component Value Range Comment   Preg Test, Ur NEGATIVE  NEGATIVE   URINE MICROSCOPIC-ADD ON     Status: Abnormal   Collection Time   01/25/12  7:49 PM      Component Value Range Comment   Squamous Epithelial / LPF MANY (*) RARE    WBC, UA 0-2  <3 WBC/hpf    RBC / HPF 3-6  <3 RBC/hpf    Bacteria, UA FEW (*) RARE   BASIC METABOLIC PANEL     Status: Normal   Collection Time   01/26/12  5:47 AM      Component Value Range Comment   Sodium 139  135 - 145 mEq/L    Potassium 3.8  3.5 - 5.1 mEq/L    Chloride 102  96 - 112 mEq/L    CO2 30  19 - 32 mEq/L    Glucose, Bld 93  70 - 99 mg/dL    BUN 7  6 - 23 mg/dL    Creatinine, Ser 1.47  0.50 - 1.10 mg/dL    Calcium 8.8  8.4 - 82.9 mg/dL    GFR calc non Af Amer >90  >90 mL/min    GFR calc Af Amer >90  >90 mL/min   CBC     Status: Normal   Collection Time   01/26/12  5:47 AM      Component Value Range Comment   WBC 6.9  4.0 - 10.5 K/uL    RBC 4.26  3.87 - 5.11 MIL/uL    Hemoglobin 12.3  12.0 - 15.0 g/dL    HCT 16.1  09.6 - 04.5 %    MCV 89.2  78.0 - 100.0 fL    MCH 28.9  26.0 - 34.0 pg    MCHC 32.4  30.0 - 36.0 g/dL    RDW 40.9  81.1 - 91.4 %    Platelets 282  150 - 400 K/uL    No results found.  Review of Systems  Constitutional: Positive for fever and chills. Negative for weight loss, malaise/fatigue and diaphoresis.  HENT: Negative.   Eyes: Negative.   Respiratory: Negative.   Cardiovascular: Negative.   Gastrointestinal: Positive  for heartburn, nausea, vomiting (nonbloody) and abdominal pain (epigastric with radiation to the right back. Some upper back pain as well.). Negative for diarrhea, constipation, blood in stool and melena.  Genitourinary: Negative.   Musculoskeletal: Negative.   Skin: Negative.   Neurological: Negative.  Negative for weakness.  Endo/Heme/Allergies: Negative.   Psychiatric/Behavioral: Negative.     Blood pressure 104/58, pulse 84, temperature 98.6 F (37 C), temperature source Oral, resp. rate 18, height 5\' 3"  (1.6 m), weight 118.48 kg (261 lb 3.2 oz), last menstrual period 01/12/2012, SpO2 96.00%. Physical Exam  Constitutional: She is oriented to person, place, and time. She appears well-developed and well-nourished. No distress.       Morbidly obese  HENT:  Head: Normocephalic and atraumatic.  Eyes: Conjunctivae normal and EOM are normal. Pupils are equal, round, and reactive to light. No scleral icterus.  Neck: Normal range of motion. Neck supple. No tracheal deviation present. No thyromegaly present.  Cardiovascular: Normal rate, regular rhythm and normal heart sounds.   Respiratory: Effort normal and breath sounds normal. No respiratory distress.  GI: Soft. Bowel sounds are normal. She exhibits no distension and no mass. There is tenderness (moderate epigastric and right upper quadrant tenderness. Positive Murphy sign.). There is no rebound and no guarding.  Musculoskeletal: Normal range of motion.  Lymphadenopathy:    She has no cervical adenopathy.  Neurological: She is alert and oriented to person, place, and time.  Skin: Skin is warm and dry.     Assessment/Plan Cholelithiasis, biliary colic. At this time I discussed with the patient surgical options. Continued keep the patient in May n.p.o. status. Continue IV fluid hydration. We'll initiate broad-spectrum antibiotics. Surgical options were discussed. Risks benefits alternatives of a laparoscopic possible open cholecystectomy  were discussed at length the patient including but not limited to risk of bleeding, infection, bile leak, small bowel injury, intraoperative cardiac and pulmonary events. As patient does not demonstrate any evidence of an acute infectious process we will plan to proceed within the next 24 hours to the operating room. Patient is comfortable with treatment plan.  Zen Cedillos C 01/26/2012, 7:01 PM

## 2012-01-27 ENCOUNTER — Encounter (HOSPITAL_COMMUNITY): Payer: Self-pay | Admitting: Anesthesiology

## 2012-01-27 ENCOUNTER — Encounter (HOSPITAL_COMMUNITY)
Admission: EM | Disposition: A | Discharge status: Home / Self Care | Payer: Self-pay | Source: Home / Self Care | Attending: General Surgery

## 2012-01-27 ENCOUNTER — Inpatient Hospital Stay (HOSPITAL_COMMUNITY): Payer: BC Managed Care – PPO | Admitting: Anesthesiology

## 2012-01-27 ENCOUNTER — Encounter (HOSPITAL_COMMUNITY): Payer: Self-pay | Admitting: *Deleted

## 2012-01-27 HISTORY — PX: CHOLECYSTECTOMY: SHX55

## 2012-01-27 SURGERY — LAPAROSCOPIC CHOLECYSTECTOMY
Anesthesia: General | Wound class: Clean Contaminated

## 2012-01-27 MED ORDER — CLINDAMYCIN PHOSPHATE 900 MG/50ML IV SOLN: 900.0000 mg | Freq: Once | INTRAVENOUS | Status: DC

## 2012-01-27 MED ORDER — FENTANYL CITRATE 0.05 MG/ML IJ SOLN
INTRAMUSCULAR | Status: AC
  Filled 2012-01-27: qty 2

## 2012-01-27 MED ORDER — PROPOFOL 10 MG/ML IV EMUL
INTRAVENOUS | Status: AC
  Filled 2012-01-27: qty 20

## 2012-01-27 MED ORDER — HYDROMORPHONE HCL PF 1 MG/ML IJ SOLN
1.0000 mg | INTRAMUSCULAR | Status: DC | PRN
  Administered 2012-01-27 (×2): 2 mg via INTRAVENOUS
  Administered 2012-01-27 – 2012-01-28 (×3): 1 mg via INTRAVENOUS
  Filled 2012-01-27: qty 1
  Filled 2012-01-27 (×3): qty 2
  Filled 2012-01-27: qty 1

## 2012-01-27 MED ORDER — ROCURONIUM BROMIDE 100 MG/10ML IV SOLN
INTRAVENOUS | Status: DC | PRN
  Administered 2012-01-27: 40 mg via INTRAVENOUS

## 2012-01-27 MED ORDER — SUCCINYLCHOLINE CHLORIDE 20 MG/ML IJ SOLN
INTRAMUSCULAR | Status: AC
  Filled 2012-01-27: qty 1

## 2012-01-27 MED ORDER — MIDAZOLAM HCL 5 MG/5ML IJ SOLN
INTRAMUSCULAR | Status: DC | PRN
  Administered 2012-01-27: 2 mg via INTRAVENOUS

## 2012-01-27 MED ORDER — GLYCOPYRROLATE 0.2 MG/ML IJ SOLN
INTRAMUSCULAR | Status: AC
  Filled 2012-01-27: qty 1

## 2012-01-27 MED ORDER — CLINDAMYCIN PHOSPHATE 900 MG/50ML IV SOLN
INTRAVENOUS | Status: DC | PRN
  Administered 2012-01-27: 900 mg via INTRAVENOUS

## 2012-01-27 MED ORDER — MIDAZOLAM HCL 2 MG/2ML IJ SOLN
INTRAMUSCULAR | Status: AC
  Filled 2012-01-27: qty 2

## 2012-01-27 MED ORDER — FENTANYL CITRATE 0.05 MG/ML IJ SOLN
25.0000 ug | INTRAMUSCULAR | Status: DC | PRN
  Administered 2012-01-27: 100 ug via INTRAVENOUS
  Administered 2012-01-27 (×2): 50 ug via INTRAVENOUS

## 2012-01-27 MED ORDER — BUPIVACAINE HCL (PF) 0.5 % IJ SOLN
INTRAMUSCULAR | Status: DC | PRN
  Administered 2012-01-27: 10 mL

## 2012-01-27 MED ORDER — GLYCOPYRROLATE 0.2 MG/ML IJ SOLN
0.2000 mg | Freq: Once | INTRAMUSCULAR | Status: AC
  Administered 2012-01-27: 0.2 mg via INTRAVENOUS

## 2012-01-27 MED ORDER — FENTANYL CITRATE 0.05 MG/ML IJ SOLN
INTRAMUSCULAR | Status: DC | PRN
  Administered 2012-01-27 (×2): 50 ug via INTRAVENOUS
  Administered 2012-01-27: 100 ug via INTRAVENOUS
  Administered 2012-01-27 (×3): 50 ug via INTRAVENOUS

## 2012-01-27 MED ORDER — ONDANSETRON HCL 4 MG/2ML IJ SOLN
INTRAMUSCULAR | Status: AC
  Filled 2012-01-27: qty 2

## 2012-01-27 MED ORDER — PROPOFOL 10 MG/ML IV BOLUS
INTRAVENOUS | Status: DC | PRN
  Administered 2012-01-27: 170 mg via INTRAVENOUS

## 2012-01-27 MED ORDER — NEOSTIGMINE METHYLSULFATE 1 MG/ML IJ SOLN
INTRAMUSCULAR | Status: AC
  Filled 2012-01-27: qty 1

## 2012-01-27 MED ORDER — CLINDAMYCIN PHOSPHATE 900 MG/50ML IV SOLN
INTRAVENOUS | Status: AC
  Filled 2012-01-27: qty 50

## 2012-01-27 MED ORDER — FENTANYL CITRATE 0.05 MG/ML IJ SOLN: 25.0000 ug | INTRAMUSCULAR | Status: DC | PRN

## 2012-01-27 MED ORDER — SODIUM CHLORIDE 0.9 % IR SOLN
Status: DC | PRN
  Administered 2012-01-27: 1000 mL

## 2012-01-27 MED ORDER — SIMETHICONE 80 MG PO CHEW
80.0000 mg | CHEWABLE_TABLET | Freq: Once | ORAL | Status: AC
  Administered 2012-01-27: 80 mg via ORAL
  Filled 2012-01-27: qty 1

## 2012-01-27 MED ORDER — LIDOCAINE HCL 1 % IJ SOLN
INTRAMUSCULAR | Status: DC | PRN
  Administered 2012-01-27: 55 mg via INTRADERMAL

## 2012-01-27 MED ORDER — MIDAZOLAM HCL 2 MG/2ML IJ SOLN
1.0000 mg | INTRAMUSCULAR | Status: DC | PRN
  Administered 2012-01-27: 2 mg via INTRAVENOUS

## 2012-01-27 MED ORDER — LIDOCAINE HCL (PF) 1 % IJ SOLN
INTRAMUSCULAR | Status: AC
  Filled 2012-01-27: qty 5

## 2012-01-27 MED ORDER — LACTATED RINGERS IV SOLN
INTRAVENOUS | Status: DC
  Administered 2012-01-27: 1000 mL via INTRAVENOUS
  Administered 2012-01-27: 08:00:00 via INTRAVENOUS

## 2012-01-27 MED ORDER — HYDROCODONE-ACETAMINOPHEN 5-325 MG PO TABS
1.0000 | ORAL_TABLET | ORAL | Status: DC | PRN
  Administered 2012-01-27 – 2012-01-29 (×9): 2 via ORAL
  Filled 2012-01-27 (×10): qty 2

## 2012-01-27 MED ORDER — ROCURONIUM BROMIDE 50 MG/5ML IV SOLN
INTRAVENOUS | Status: AC
  Filled 2012-01-27: qty 1

## 2012-01-27 MED ORDER — ONDANSETRON HCL 4 MG/2ML IJ SOLN
4.0000 mg | Freq: Once | INTRAMUSCULAR | Status: AC | PRN
  Administered 2012-01-27: 4 mg via INTRAVENOUS

## 2012-01-27 MED ORDER — FENTANYL CITRATE 0.05 MG/ML IJ SOLN
INTRAMUSCULAR | Status: AC
  Filled 2012-01-27: qty 5

## 2012-01-27 MED ORDER — ONDANSETRON HCL 4 MG/2ML IJ SOLN
4.0000 mg | Freq: Once | INTRAMUSCULAR | Status: AC
  Administered 2012-01-27: 4 mg via INTRAVENOUS

## 2012-01-27 MED ORDER — HEMOSTATIC AGENTS (NO CHARGE) OPTIME
TOPICAL | Status: DC | PRN
  Administered 2012-01-27: 1

## 2012-01-27 MED ORDER — GLYCOPYRROLATE 0.2 MG/ML IJ SOLN
INTRAMUSCULAR | Status: AC
  Filled 2012-01-27: qty 2

## 2012-01-27 MED ORDER — BUPIVACAINE HCL (PF) 0.5 % IJ SOLN
INTRAMUSCULAR | Status: AC
  Filled 2012-01-27: qty 30

## 2012-01-27 MED ORDER — SUCCINYLCHOLINE CHLORIDE 20 MG/ML IJ SOLN
INTRAMUSCULAR | Status: DC | PRN
  Administered 2012-01-27: 200 mg via INTRAVENOUS

## 2012-01-27 MED ORDER — GLYCOPYRROLATE 0.2 MG/ML IJ SOLN
INTRAMUSCULAR | Status: DC | PRN
  Administered 2012-01-27: 0.4 mg via INTRAVENOUS

## 2012-01-27 MED ORDER — ONDANSETRON HCL 4 MG/2ML IJ SOLN: 4.0000 mg | Freq: Once | INTRAMUSCULAR | Status: DC | PRN

## 2012-01-27 MED ORDER — NEOSTIGMINE METHYLSULFATE 1 MG/ML IJ SOLN
INTRAMUSCULAR | Status: DC | PRN
  Administered 2012-01-27: 3 mg via INTRAVENOUS

## 2012-01-27 SURGICAL SUPPLY — 37 items
APPLIER CLIP UNV 5X34 EPIX (ENDOMECHANICALS) ×2 IMPLANT
BAG HAMPER (MISCELLANEOUS) ×2 IMPLANT
BENZOIN TINCTURE PRP APPL 2/3 (GAUZE/BANDAGES/DRESSINGS) ×2 IMPLANT
CLOTH BEACON ORANGE TIMEOUT ST (SAFETY) ×2 IMPLANT
COVER LIGHT HANDLE STERIS (MISCELLANEOUS) ×4 IMPLANT
DECANTER SPIKE VIAL GLASS SM (MISCELLANEOUS) ×2 IMPLANT
DEVICE TROCAR PUNCTURE CLOSURE (ENDOMECHANICALS) ×2 IMPLANT
DURAPREP 26ML APPLICATOR (WOUND CARE) ×2 IMPLANT
ELECT REM PT RETURN 9FT ADLT (ELECTROSURGICAL) ×2
ELECTRODE REM PT RTRN 9FT ADLT (ELECTROSURGICAL) ×1 IMPLANT
FILTER SMOKE EVAC LAPAROSHD (FILTER) ×2 IMPLANT
FORMALIN 10 PREFIL 120ML (MISCELLANEOUS) ×2 IMPLANT
GLOVE BIOGEL PI IND STRL 7.0 (GLOVE) ×3 IMPLANT
GLOVE BIOGEL PI IND STRL 7.5 (GLOVE) ×1 IMPLANT
GLOVE BIOGEL PI INDICATOR 7.0 (GLOVE) ×3
GLOVE BIOGEL PI INDICATOR 7.5 (GLOVE) ×1
GLOVE ECLIPSE 6.5 STRL STRAW (GLOVE) ×6 IMPLANT
GLOVE ECLIPSE 7.0 STRL STRAW (GLOVE) ×4 IMPLANT
GOWN STRL REIN XL XLG (GOWN DISPOSABLE) ×6 IMPLANT
HEMOSTAT SNOW SURGICEL 2X4 (HEMOSTASIS) ×2 IMPLANT
INST SET LAPROSCOPIC AP (KITS) ×2 IMPLANT
IV NS IRRIG 3000ML ARTHROMATIC (IV SOLUTION) IMPLANT
KIT ROOM TURNOVER APOR (KITS) ×2 IMPLANT
MANIFOLD NEPTUNE II (INSTRUMENTS) ×2 IMPLANT
NEEDLE INSUFFLATION 14GA 120MM (NEEDLE) ×2 IMPLANT
PACK LAP CHOLE LZT030E (CUSTOM PROCEDURE TRAY) ×2 IMPLANT
PAD ARMBOARD 7.5X6 YLW CONV (MISCELLANEOUS) ×2 IMPLANT
POUCH SPECIMEN RETRIEVAL 10MM (ENDOMECHANICALS) ×2 IMPLANT
SET BASIN LINEN APH (SET/KITS/TRAYS/PACK) ×2 IMPLANT
SET TUBE IRRIG SUCTION NO TIP (IRRIGATION / IRRIGATOR) IMPLANT
SLEEVE Z-THREAD 5X100MM (TROCAR) ×4 IMPLANT
STRIP CLOSURE SKIN 1/2X4 (GAUZE/BANDAGES/DRESSINGS) ×2 IMPLANT
SUT MNCRL AB 4-0 PS2 18 (SUTURE) ×4 IMPLANT
SUT VIC AB 2-0 CT2 27 (SUTURE) ×2 IMPLANT
TROCAR Z-THRD FIOS HNDL 11X100 (TROCAR) ×2 IMPLANT
TROCAR Z-THREAD FIOS 5X100MM (TROCAR) ×2 IMPLANT
WARMER LAPAROSCOPE (MISCELLANEOUS) ×2 IMPLANT

## 2012-01-27 NOTE — Op Note (Signed)
Patient:  Kathleen Velasquez  DOB:  1990/05/15  MRN:  [id number]   Preop Diagnosis:  Cholelithiasis, biliary colic  Postop Diagnosis:  The same  Procedure:  Laparoscopic cholecystectomy  Surgeon:  Dr. Tilford Pillar  Anes:  General endotracheal, 0.5% Sensorcaine plain for local  Indications:  Patient presented to [facility] with epigastric abdominal pain. She's had extensive workup over the last 6 months which is demonstrated cholelithiasis. Clinically patient symptomatology is consistent with biliary colic. Risks benefits alternatives a laparoscopic possible open cholecystectomy were discussed at length patient including but not limited to risk of bleeding, infection, bile leak, small bowel injury, common bile duct injury, intraoperative cardiac and pulmonary events. Patient's questions and concerns are addressed the patient as consented for the planned procedure.  Procedure note:  Patient was taken to the operating room was placed in a supine position the or table time the general anesthetic is administered. Once patient was asleep she symmetrically intubated by the nurse anesthetist. At this point her abdomen is prepped with DuraPrep solution and draped in standard fashion. A time out was performed. Stab incision was created supraumbilically with 11 blade scalpel with additional dissection down to subcuticular tissue carried out using a Coker clamp. The clamp was then utilized to grasp the anterior normal fascia and lift his anteriorly. A Veress needle is inserted saline drop test is utilized confirm intraperitoneal placement. Once this is confirmed pneumoperitoneum was initiated and once sufficient pneumoperitoneum was obtained an 11 mm insert overlap scope allowing visualization the trocar entering into the peritoneal cavity. At this point the remaining trochars replaced a 5 mm can epigastrium, 5 mm in the midline, and a 5 mm in the right lateral abdominal wall. Patient's placed into a  reverse Trendelenburg left decubitus position. The fundus of the gallbladder is grasped and lifted up and over the right lobe the liver. Blunt dissection is carried out to strip the peritoneal reflection off the fibula exposing both the cystic duct and cystic arteries he entered into the infundibulum. Close time was identified. A window was created by the cystic duct and cystic artery. 3 endoclips placed proximally one distally and cystic duct and the cystic duct was divided between 2 most distal clips. 2 endoclips placed proximally one distally and the cystic artery and cystic arteries divided between 2 most distal clips. At this time electrocautery utilized dissect the gallbladder free from the gallbladder fossa. Once it is free is placed into an Endo Catch bag and placed the right lower quadrant. To facilitate this the 10 mm scope is exchanged for a 5 mm scope. The gallbladder fossa was inspected hemostasis is excellent having been attainable electrocautery. Due to the raw nature the gallbladder fossa did opt to place a piece of Surgicel snow into the gallbladder fossa. The endoclips were also inspected there is no evidence of any bleeding or bile leak. At this time I turned my attention to closure.  Using Endo Close suture passing device a 2-0 Vicryl sutures passed to the umbilical trocar site. With this suture and placed the gallbladder is retrieved was removed the umbilical trocar site and intact Endo Catch bag. To facilitate this the trocar site did require both blunt and sharp dilatation to enlarge adequately to remove the gallbladder. The gallbladder was sent as a permanent specimen to pathology. At this time the pneumoperitoneum was evacuated. Trochars were removed. The Vicryl sutures secured. The local anesthetic is instilled. The skin edges at all 4 trocar sites was reapproximated using a  4-0 Monocryl. The skin was washed dried moist dry towel. Benzoin is applied around the incisions. Half-inch  Steri-Strips are placed,  The patient was allowed to come out of general anesthetic. She is transferred to the PACU in stable condition. At the conclusion of procedure all instrument, sponge, needle counts are correct.  Complications:  None apparent  EBL:  Minimal  Specimen:  Gallbladder

## 2012-01-27 NOTE — Progress Notes (Signed)
UR Chart Review Completed  

## 2012-01-27 NOTE — Transfer of Care (Signed)
Immediate Anesthesia Transfer of Care Note  Patient: Kathleen Velasquez  Procedure(s) Performed: Procedure(s) (LRB) with comments: LAPAROSCOPIC CHOLECYSTECTOMY (N/A)  Patient Location: PACU  Anesthesia Type:General  Level of Consciousness: awake and patient cooperative  Airway & Oxygen Therapy: Patient Spontanous Breathing and Patient connected to face mask oxygen  Post-op Assessment: Report given to PACU RN, Post -op Vital signs reviewed and stable and Patient moving all extremities  Post vital signs: Reviewed and stable  Complications: No apparent anesthesia complications

## 2012-01-27 NOTE — Progress Notes (Signed)
Day of Surgery  Subjective: Still with right upper quadrant abdominal pain. No significant change.  Objective: Vital signs in last 24 hours: Temp:  [98 F (36.7 C)-101.9 F (38.8 C)] 98.5 F (36.9 C) (01/15 0748) Pulse Rate:  [76-99] 76  (01/15 0545) Resp:  [13-29] 15  (01/15 0840) BP: (92-128)/(49-72) 113/58 mmHg (01/15 0840) SpO2:  [88 %-99 %] 99 % (01/15 0840) Last BM Date: 01/24/12  Intake/Output from previous day: 01/14 0701 - 01/15 0700 In: 3891.8 [P.O.:1080; I.V.:2811.8] Out: -  Intake/Output this shift:    General appearance: alert and no distress GI: Positive bowel sounds, soft, obese, moderate to severe right upper quadrant abdominal pain. No diffuse peritoneal signs  Lab Results:   Basename 01/26/12 0547 01/25/12 1927  WBC 6.9 7.8  HGB 12.3 13.7  HCT 38.0 41.0  PLT 282 287   BMET  Basename 01/26/12 0547 01/25/12 1927  NA 139 138  K 3.8 3.7  CL 102 101  CO2 30 27  GLUCOSE 93 87  BUN 7 5*  CREATININE 0.73 0.73  CALCIUM 8.8 8.9   PT/INR No results found for this basename: LABPROT:2,INR:2 in the last 72 hours ABG No results found for this basename: PHART:2,PCO2:2,PO2:2,HCO3:2 in the last 72 hours  Studies/Results: No results found.  Anti-infectives: Anti-infectives    None      Assessment/Plan: s/p Procedure(s) (LRB) with comments: LAPAROSCOPIC CHOLECYSTECTOMY (N/A) Biliary colic. We'll plan to proceed to the operating room as discussed. Risks benefits alternatives again discussed with patient.  LOS: 2 days    Kathleen Velasquez C 01/27/2012

## 2012-01-27 NOTE — Anesthesia Postprocedure Evaluation (Signed)
  Anesthesia Post-op Note  Patient: Kathleen Velasquez  Procedure(s) Performed: Procedure(s) (LRB) with comments: LAPAROSCOPIC CHOLECYSTECTOMY (N/A)  Patient Location: PACU  Anesthesia Type:General  Level of Consciousness: awake, alert , oriented and patient cooperative  Airway and Oxygen Therapy: Patient Spontanous Breathing  Post-op Pain: 4 /10, moderate  Post-op Assessment: Post-op Vital signs reviewed, Patient's Cardiovascular Status Stable, Respiratory Function Stable, Patent Airway, No signs of Nausea or vomiting and Pain level controlled  Post-op Vital Signs: Reviewed and stable  Complications: No apparent anesthesia complications

## 2012-01-27 NOTE — Anesthesia Preprocedure Evaluation (Signed)
Anesthesia Evaluation  Patient identified by MRN, date of birth, ID band Patient awake    Reviewed: Allergy & Precautions, H&P , NPO status , Patient's Chart, lab work & pertinent test results  History of Anesthesia Complications Negative for: history of anesthetic complications  Airway Mallampati: III TM Distance: >3 FB Neck ROM: Full    Dental  (+) Poor Dentition and Chipped   Pulmonary Current Smoker (am cough),  breath sounds clear to auscultation        Cardiovascular negative cardio ROS  Rhythm:Regular Rate:Normal     Neuro/Psych    GI/Hepatic RUQ pain, nausea   Endo/Other  Morbid obesity  Renal/GU      Musculoskeletal   Abdominal   Peds  Hematology   Anesthesia Other Findings   Reproductive/Obstetrics                           Anesthesia Physical Anesthesia Plan  ASA: II  Anesthesia Plan: General   Post-op Pain Management:    Induction: Intravenous, Rapid sequence and Cricoid pressure planned  Airway Management Planned: Oral ETT  Additional Equipment:   Intra-op Plan:   Post-operative Plan: Extubation in OR  Informed Consent: I have reviewed the patients History and Physical, chart, labs and discussed the procedure including the risks, benefits and alternatives for the proposed anesthesia with the patient or authorized representative who has indicated his/her understanding and acceptance.     Plan Discussed with:   Anesthesia Plan Comments:         Anesthesia Quick Evaluation

## 2012-01-27 NOTE — Anesthesia Procedure Notes (Signed)
Procedure Name: Intubation Date/Time: 01/27/2012 9:48 AM Performed by: Despina Hidden Pre-anesthesia Checklist: Emergency Drugs available, Suction available, Patient being monitored and Patient identified Patient Re-evaluated:Patient Re-evaluated prior to inductionOxygen Delivery Method: Circle system utilized Preoxygenation: Pre-oxygenation with 100% oxygen Intubation Type: IV induction, Cricoid Pressure applied and Rapid sequence Ventilation: Mask ventilation without difficulty Laryngoscope Size: 3 and Mac Grade View: Grade I Tube type: Oral Tube size: 7.0 mm Number of attempts: 1 Airway Equipment and Method: Stylet Placement Confirmation: ETT inserted through vocal cords under direct vision,  positive ETCO2 and breath sounds checked- equal and bilateral Secured at: 22 cm Tube secured with: Tape Dental Injury: Teeth and Oropharynx as per pre-operative assessment

## 2012-01-28 MED ORDER — BISACODYL 10 MG RE SUPP
10.0000 mg | Freq: Once | RECTAL | Status: AC
  Administered 2012-01-28: 10 mg via RECTAL
  Filled 2012-01-28: qty 1

## 2012-01-28 NOTE — Anesthesia Postprocedure Evaluation (Signed)
  Anesthesia Post-op Note  Patient: Kathleen Velasquez  Procedure(s) Performed: Procedure(s) (LRB) with comments: LAPAROSCOPIC CHOLECYSTECTOMY (N/A)  Patient Location: room 322  Anesthesia Type:General  Level of Consciousness: awake, alert , oriented and patient cooperative  Airway and Oxygen Therapy: Patient Spontanous Breathing and Patient connected to nasal cannula oxygen  Post-op Pain: 5 /10, moderate  Post-op Assessment: Post-op Vital signs reviewed, Patient's Cardiovascular Status Stable, Respiratory Function Stable, Patent Airway, NAUSEA AND VOMITING PRESENT and Adequate PO intake  Post-op Vital Signs: Reviewed and stable  Complications: No apparent anesthesia complications

## 2012-01-28 NOTE — Progress Notes (Signed)
1 Day Post-Op  Subjective: Pain still present. Somewhat poorly controlled. Complaints of no bowel movement. Tolerating diet. No nausea. No fevers chills.  Objective: Vital signs in last 24 hours: Temp:  [97.6 F (36.4 C)-99.5 F (37.5 C)] 98.4 F (36.9 C) (01/16 1454) Pulse Rate:  [54-76] 62  (01/16 1454) Resp:  [18-19] 18  (01/16 1454) BP: (98-105)/(53-61) 105/53 mmHg (01/16 1454) SpO2:  [90 %-97 %] 95 % (01/16 1454) Last BM Date: 01/24/12  Intake/Output from previous day: 01/15 0701 - 01/16 0700 In: 1210 [P.O.:360; I.V.:850] Out: -  Intake/Output this shift:    General appearance: alert and no distress GI: Positive bowel sounds, soft, expected postoperative tenderness. Incisions are clean dry and intact. No peritoneal signs.  Lab Results:   [facility] 01/26/12 0547  WBC 6.9  HGB 12.3  HCT 38.0  PLT 282   BMET  Basename 01/26/12 0547  NA 139  K 3.8  CL 102  CO2 30  GLUCOSE 93  BUN 7  CREATININE 0.73  CALCIUM 8.8   PT/INR No results found for this basename: LABPROT:2,INR:2 in the last 72 hours ABG No results found for this basename: PHART:2,PCO2:2,PO2:2,HCO3:2 in the last 72 hours  Studies/Results: No results found.  Anti-infectives: Anti-infectives     Start     Dose/Rate Route Frequency Ordered Stop   01/27/12 0930   clindamycin (CLEOCIN) IVPB 900 mg  Status:  Discontinued        900 mg 100 mL/hr over 30 Minutes Intravenous  Once 01/27/12 0923 01/27/12 1143          Assessment/Plan: s/p Procedure(s) (LRB) with comments: LAPAROSCOPIC CHOLECYSTECTOMY (N/A) Increase activity. Stool softener for bowel stimulation. Ambulate in hall. Hopeful discharge later today.  LOS: 3 days    Kiyanna Biegler C 01/28/2012

## 2012-01-28 NOTE — Progress Notes (Signed)
Patient ambulated in hall with no difficulty, tolerated well

## 2012-01-28 NOTE — Addendum Note (Signed)
Addendum  created 01/28/12 0809 by Kristyne Woodring J Laker Thompson, CRNA   Modules edited:Notes Section    

## 2012-01-28 NOTE — Plan of Care (Signed)
Problem: Phase III Progression Outcomes Goal: Activity at appropriate level-compared to baseline (UP IN CHAIR FOR HEMODIALYSIS)  Outcome: Progressing Pt up to bathroom, needs lots of encouragement to ambulate.

## 2012-01-29 ENCOUNTER — Encounter (HOSPITAL_COMMUNITY): Payer: Self-pay | Admitting: General Surgery

## 2012-01-29 MED ORDER — HYDROCODONE-ACETAMINOPHEN 5-325 MG PO TABS: 1.0000 | ORAL_TABLET | ORAL | Status: DC | PRN

## 2012-01-29 NOTE — Care Management Note (Signed)
    Page 1 of 1   01/29/2012     7:37:52 AM   CARE MANAGEMENT NOTE 01/29/2012  Patient:  Kathleen Velasquez, Kathleen Velasquez   Account Number:  [id number]  Date Initiated:  01/29/2012  Documentation initiated by:  Sharrie Rothman  Subjective/Objective Assessment:   Pt admitted from home with abd pain. Pt lives with her mother but will discharge home with an uncle. Pt is independent with ADL's.     Action/Plan:   No CM or HH needs noted. Pt to be discharged home today.   Anticipated DC Date:  01/29/2012   Anticipated DC Plan:  HOME/SELF CARE      DC Planning Services  CM consult      Choice offered to / List presented to:             Status of service:  Completed, signed off Medicare Important Message given?   (If response is "NO", the following Medicare IM given date fields will be blank) Date Medicare IM given:   Date Additional Medicare IM given:    Discharge Disposition:  HOME/SELF CARE  Per UR Regulation:    If discussed at Long Length of Stay Meetings, dates discussed:    Comments:  01/29/12 0735 Arlyss Queen, RN BSN CM

## 2012-01-29 NOTE — Discharge Summary (Signed)
Physician Discharge Summary  Patient ID: Kathleen Velasquez MRN: [id number] DOB/AGE: 1990-03-06 22 y.o.  Admit date: 01/25/2012 Discharge date: 01/29/2012  Admission Diagnoses: Biliary colic  Discharge Diagnoses: The same Active Problems:  * No active hospital problems. *    Discharged Condition: stable  Hospital Course: Patient presented to [facility] with epigastric and right upper quadrant abdominal pain. Patient has known history of gallstones. Surgical options were discussed with patient. She was scheduled for her surgical intervention during her admission. She underwent a laparoscopic cholecystectomy without difficulties. She had a brief stay in the PACU and then was transferred back to regular surgical floor. She was advanced on her diet. Her activity level was increased. Her pain control was switched to oral pain medication. Today she is comfortable. Plans are made for discharge.  Consults: None  Significant Diagnostic Studies: labs: Daily labs  Treatments: IV hydration and surgery: Laparoscopic cholecystectomy  Discharge Exam: Blood pressure 121/76, pulse 70, temperature 98.1 F (36.7 C), temperature source Oral, resp. rate 18, height 5\' 3"  (1.6 m), weight 118.48 kg (261 lb 3.2 oz), last menstrual period 01/12/2012, SpO2 95.00%. General appearance: alert, no distress and Morbidly obese Resp: clear to auscultation bilaterally Cardio: regular rate and rhythm GI: Positive bowel sounds, soft, flat, obese, expected postoperative tenderness. Incisions are clean dry and intact. Steri-Strips are placed. No peritoneal signs.  Disposition: 01-Home or Self Care  Discharge Orders    Future Orders Please Complete By Expires   Diet - low sodium heart healthy      Increase activity slowly      Discharge instructions      Comments:   Increase activity as tolerated. May place ice pack for comfort.  Alternate an anti-inflammatory such as ibuprofen (Motrin, Advil) 400-600mg  every 6  hours with the prescribed pain medication.   Do not take any additional acetaminophen as there is Tylenol in the pain medication.   Driving Restrictions      Comments:   No driving while on pain medications.   Lifting restrictions      Comments:   No lifting over 20lbs for 4-5 weeks post-op.   Discharge wound care:      Comments:   Clean surgical sites with soap and water.  May shower the morning after surgery unless instructed by Dr. Leticia Penna otherwise.  No soaking for 2-3 weeks.    If adhesive strips are in place, they may be removed in 1-2 weeks while in the shower.   Call MD for:  temperature >100.4      Call MD for:  persistant nausea and vomiting      Call MD for:  severe uncontrolled pain      Call MD for:  redness, tenderness, or signs of infection (pain, swelling, redness, odor or green/yellow discharge around incision site)      Call MD for:  difficulty breathing, headache or visual disturbances          Medication List     As of 01/29/2012 12:06 PM    TAKE these medications         etonogestrel-ethinyl estradiol 0.12-0.015 MG/24HR vaginal ring   Commonly known as: NUVARING   Place 1 each vaginally every 28 (twenty-eight) days. Insert vaginally and leave in place for 3 consecutive weeks, then remove for 1 week.      HYDROcodone-acetaminophen 5-325 MG per tablet   Commonly known as: NORCO/VICODIN   Take 1-2 tablets by mouth every 4 (four) hours as needed. For pain  ibuprofen 200 MG tablet   Commonly known as: ADVIL,MOTRIN   Take 800 mg by mouth every 6 (six) hours as needed. For pain      oxyCODONE-acetaminophen 5-325 MG per tablet   Commonly known as: PERCOCET/ROXICET   Take 1 tablet by mouth every 6 (six) hours as needed for pain.      oxyCODONE-acetaminophen 5-325 MG per tablet   Commonly known as: PERCOCET/ROXICET   Take 1 tablet by mouth every 6 (six) hours as needed for pain.      predniSONE 10 MG tablet   Commonly known as: DELTASONE   Take 2  tablets (20 mg total) by mouth daily.           Follow-up Information    Follow up with Detra Bores C, MD. In 3 weeks.   Contact information:   [address] [place] [address] [phone]          Signed: Fabio Bering 01/29/2012, 12:06 PM

## 2012-01-29 NOTE — Progress Notes (Signed)
Patient with orders to be discharge home. Discharge instructions given, patient verbalized understanding via teach back method. Patient in stable condition upon discharge. Patient left with mother in private vehicle.

## 2012-05-13 ENCOUNTER — Emergency Department (HOSPITAL_COMMUNITY)
Admission: EM | Admit: 2012-05-13 | Discharge: 2012-05-14 | Disposition: A | Discharge status: Home / Self Care | Payer: BC Managed Care – PPO | Attending: Emergency Medicine | Admitting: Emergency Medicine

## 2012-05-13 ENCOUNTER — Encounter (HOSPITAL_COMMUNITY): Payer: Self-pay | Admitting: *Deleted

## 2012-05-13 ENCOUNTER — Emergency Department (HOSPITAL_COMMUNITY): Payer: BC Managed Care – PPO

## 2012-05-13 DIAGNOSIS — Y939 Activity, unspecified: Secondary | ICD-10-CM | POA: Insufficient documentation

## 2012-05-13 DIAGNOSIS — W230XXA Caught, crushed, jammed, or pinched between moving objects, initial encounter: Secondary | ICD-10-CM | POA: Insufficient documentation

## 2012-05-13 DIAGNOSIS — F17200 Nicotine dependence, unspecified, uncomplicated: Secondary | ICD-10-CM | POA: Insufficient documentation

## 2012-05-13 DIAGNOSIS — Y929 Unspecified place or not applicable: Secondary | ICD-10-CM | POA: Insufficient documentation

## 2012-05-13 DIAGNOSIS — S6390XA Sprain of unspecified part of unspecified wrist and hand, initial encounter: Secondary | ICD-10-CM | POA: Insufficient documentation

## 2012-05-13 DIAGNOSIS — Z880 Allergy status to penicillin: Secondary | ICD-10-CM | POA: Insufficient documentation

## 2012-05-13 DIAGNOSIS — S63601A Unspecified sprain of right thumb, initial encounter: Secondary | ICD-10-CM

## 2012-05-13 NOTE — ED Notes (Signed)
Pt reporting pain in right thumb and wrist after hitting it on a bedframe while changing sheets.  Pt states "It hurts now and I know it will hurt tomorrow at work, so I just need something to show it was injured in case I need someone to help me work."

## 2012-05-14 MED ORDER — IBUPROFEN 800 MG PO TABS: 800.0000 mg | ORAL_TABLET | Freq: Three times a day (TID) | ORAL | Status: DC

## 2012-05-14 MED ORDER — HYDROCODONE-ACETAMINOPHEN 5-325 MG PO TABS: ORAL_TABLET | ORAL | Status: DC

## 2012-05-14 NOTE — ED Provider Notes (Signed)
History     CSN: [id number]  Arrival date & time 05/13/12  2311   First MD Initiated Contact with Patient 05/13/12 2334      Chief Complaint  Patient presents with  . Hand Injury    (Consider location/radiation/quality/duration/timing/severity/associated sxs/prior treatment) HPI Comments: Patient c/o pain to her right thumb after "jamming" her thumb between her mattress and bed railing. States she may have also "bent her thumb backwards".  Pain to the thumb is worse with movement.  She has not applied any ice or taken any medications for pain.  Patient is right hand dominant.  She is also requesting a work restriction note    Patient is a 22 y.o. female presenting with hand injury. The history is provided by the patient.  Hand Injury Location:  Finger Injury: yes   Mechanism of injury comment:  Direct blow Finger location:  R thumb Pain details:    Quality:  Throbbing and sharp   Radiates to:  R wrist   Severity:  Mild   Onset quality:  Sudden   Timing:  Constant   Progression:  Unchanged Chronicity:  New Handedness:  Right-handed Dislocation: no   Foreign body present:  No foreign bodies Prior injury to area:  No Relieved by:  Nothing Worsened by:  Movement Ineffective treatments:  None tried Associated symptoms: decreased range of motion   Associated symptoms: no back pain, no fatigue, no fever, no muscle weakness, no neck pain, no numbness, no stiffness, no swelling and no tingling     Past Medical History  Diagnosis Date  . Abscess     right buttocks/thigh    Past Surgical History  Procedure Laterality Date  . Hernia repair    . Tonsillectomy    . Adenoidectomy    . Hydradenitis excision  10/02/2011    Procedure: EXCISION HYDRADENITIS AXILLA;  Surgeon: Fabio Bering, MD;  Location: [facility];  Service: General;  Laterality: Right;  Right Axillary Dissection  . Cholecystectomy  01/27/2012    Procedure: LAPAROSCOPIC CHOLECYSTECTOMY;  Surgeon: Fabio Bering, MD;   Location: [facility];  Service: General;  Laterality: N/A;    History reviewed. No pertinent family history.  History  Substance Use Topics  . Smoking status: Current Every Day Smoker -- 1.00 packs/day for 5 years    Types: Cigarettes  . Smokeless tobacco: Never Used  . Alcohol Use: Yes     Comment: social use    OB History   Grav Para Term Preterm Abortions TAB SAB Ect Mult Living                  Review of Systems  Constitutional: Negative for fever, chills and fatigue.  HENT: Negative for neck pain.   Genitourinary: Negative for dysuria and difficulty urinating.  Musculoskeletal: Positive for arthralgias. Negative for back pain, joint swelling and stiffness.  Skin: Negative for color change and wound.  Neurological: Negative for weakness and numbness.  All other systems reviewed and are negative.    Allergies  Bactrim; Ciprofloxacin; Other; Penicillins; and Percocet  Home Medications   Current Outpatient Rx  Name  Route  Sig  Dispense  Refill  . etonogestrel-ethinyl estradiol (NUVARING) 0.12-0.015 MG/24HR vaginal ring   Vaginal   Place 1 each vaginally every 28 (twenty-eight) days. Insert vaginally and leave in place for 3 consecutive weeks, then remove for 1 week.         Marland Kitchen HYDROcodone-acetaminophen (NORCO/VICODIN) 5-325 MG per tablet   Oral  Take 1-2 tablets by mouth every 4 (four) hours as needed. For pain   45 tablet   0   . ibuprofen (ADVIL,MOTRIN) 200 MG tablet   Oral   Take 800 mg by mouth every 6 (six) hours as needed. For pain         . predniSONE (DELTASONE) 10 MG tablet   Oral   Take 2 tablets (20 mg total) by mouth daily.   15 tablet   0     BP 130/76  Pulse 76  Temp(Src) 98.5 F (36.9 C) (Oral)  Resp 20  Ht 5\' 4"  (1.626 m)  Wt 245 lb (111.131 kg)  BMI 42.03 kg/m2  SpO2 97%  LMP 04/16/2012  Physical Exam  Nursing note and vitals reviewed. Constitutional: She is oriented to person, place, and time. She appears well-developed and  well-nourished. No distress.  HENT:  Head: Normocephalic and atraumatic.  Cardiovascular: Normal rate, regular rhythm, normal heart sounds and intact distal pulses.   No murmur heard. Pulmonary/Chest: Effort normal and breath sounds normal. No respiratory distress.  Musculoskeletal: She exhibits tenderness. She exhibits no edema.       Right hand: She exhibits decreased range of motion and tenderness. She exhibits no bony tenderness, normal two-point discrimination, normal capillary refill, no deformity, no laceration and no swelling. Normal sensation noted. Normal strength noted.       Hands: ttp of the proximal right thumb.  Radial pulse is brisk, distal sensation intact.  CR< 2 sec.  No bruising, edema or bony deformity.  Palmar flexion of the thumb is limited to due level of pain.  Right wrist is NT  Neurological: She is alert and oriented to person, place, and time. She exhibits normal muscle tone. Coordination normal.  Skin: Skin is warm and dry.    ED Course  Procedures (including critical care time)  Labs Reviewed - No data to display Dg Finger Thumb Right  05/14/2012  *RADIOLOGY REPORT*  Clinical Data: Thumb injury/pain  RIGHT THUMB 2+V  Comparison: None.  Findings: Four views of the foot demonstrate no acute fracture or malalignment. Bony mineralization is within normal limits.  The other visualized bones and joints are unremarkable.  IMPRESSION: No acute osseous injury.   Original Report Authenticated By: Malachy Moan, M.D.      velcro thumb spice applied, pain improved.  Remains NV intact.     MDM    X-ray finding discussed with the patient.  She agrees to elevate, ice packs and follow-up with orthopedics .  Given referral info for Dr. Hilda Lias.    The patient appears reasonably screened and/or stabilized for discharge and I doubt any other medical condition or other [facility] requiring further screening, evaluation, or treatment in the ED at this time prior to discharge.      Donnisha Besecker L. Trisha Mangle, PA-C 05/14/12 [phone]

## 2012-05-14 NOTE — ED Provider Notes (Signed)
Medical screening examination/treatment/procedure(s) were performed by non-physician practitioner and as supervising physician I was immediately available for consultation/collaboration.  Nicoletta Dress. Colon Branch, MD 05/14/12 [phone]

## 2012-05-23 ENCOUNTER — Telehealth: Payer: Self-pay | Admitting: *Deleted

## 2012-05-23 NOTE — Telephone Encounter (Signed)
Spoke with pt. Has been trying to have a baby x 7 months. Has not gotten pregnant yet. Wants to discuss this with Dr. Emelda Fear. Appt scheduled.

## 2012-05-24 ENCOUNTER — Telehealth: Payer: Self-pay | Admitting: Obstetrics and Gynecology

## 2012-05-24 NOTE — Telephone Encounter (Signed)
Can she take megace for heavy bleeding while she is trying to get pregnant? I spoke with JAG and she advised no. I advised the pt that she should not take the megace at this time. She stated that she has had heavy bleeding for the past 12 days, I advised her that she would need to make an appointment for the bleeding issue.  The pt stated she would call back in the morning to make an appointment.

## 2012-05-25 ENCOUNTER — Encounter: Payer: Self-pay | Admitting: Adult Health

## 2012-05-25 ENCOUNTER — Ambulatory Visit (INDEPENDENT_AMBULATORY_CARE_PROVIDER_SITE_OTHER): Payer: BC Managed Care – PPO | Admitting: Adult Health

## 2012-05-25 VITALS — BP 120/68 | Ht 64.0 in | Wt 241.0 lb

## 2012-05-25 DIAGNOSIS — Z3009 Encounter for other general counseling and advice on contraception: Secondary | ICD-10-CM

## 2012-05-25 DIAGNOSIS — Z3200 Encounter for pregnancy test, result unknown: Secondary | ICD-10-CM

## 2012-05-25 DIAGNOSIS — N938 Other specified abnormal uterine and vaginal bleeding: Secondary | ICD-10-CM | POA: Insufficient documentation

## 2012-05-25 DIAGNOSIS — N949 Unspecified condition associated with female genital organs and menstrual cycle: Secondary | ICD-10-CM

## 2012-05-25 DIAGNOSIS — N939 Abnormal uterine and vaginal bleeding, unspecified: Secondary | ICD-10-CM

## 2012-05-25 DIAGNOSIS — Z3202 Encounter for pregnancy test, result negative: Secondary | ICD-10-CM

## 2012-05-25 DIAGNOSIS — N898 Other specified noninflammatory disorders of vagina: Secondary | ICD-10-CM

## 2012-05-25 HISTORY — DX: Other specified abnormal uterine and vaginal bleeding: N93.8

## 2012-05-25 LAB — POCT HEMOGLOBIN: Hemoglobin: 14.3 g/dL (ref 12.2–16.2)

## 2012-05-25 MED ORDER — MEGESTROL ACETATE 40 MG PO TABS: ORAL_TABLET | ORAL | Status: DC

## 2012-05-25 NOTE — Progress Notes (Signed)
Subjective:     Patient ID: Kathleen Velasquez, female   DOB: [birth year], 22 y.o.   MRN: [id number]  HPI Kathleen Velasquez is a 22 year old white female in for bleeding since 05/17/12. It started light and has gotten heavier, and having cramps. Taking tylenol. Periods have been regular since October. She stopped the nuva ring in September. She saw Dr. Emelda Fear in June of 2013 for DUB ?chronic oligo ovulation and was given megace and nuva ring. Review of Systems Patient denies any headaches, blurred vision, shortness of breath, chest pain, problems with bowel movements, urination, or intercourse. Positives as in HPI. She desires pregnancy in near future.  Reviewed past medical,surgical, social and family history. Reviewed medications and allergies.     Objective:   Physical Exam Blood pressure 120/68, height 5\' 4"  (1.626 m), weight 241 lb (109.317 kg), last menstrual period 05/16/2012.Urine pregnancy test negative and finger stick HGB 14.3. Skin warm and dry.Pelvic: external genitalia is normal in appearance, vagina: + period type blood, cervix:smooth with negative CMT, uterus: normal size, shape and contour, non tender, no masses felt, adnexa: no masses or tenderness noted.     Assessment:      DUB  Desires pregnancy in near future  Plan:      Check CMP, TSH and A1c   Rx Megace 40 mg 3 x 5 days then 2 x 5 days then 1 daily Follow up as scheduled end of May

## 2012-05-25 NOTE — Patient Instructions (Addendum)
Take megace Follow up as scheduled may 27 Sign up for my chart

## 2012-05-26 ENCOUNTER — Telehealth: Payer: Self-pay | Admitting: Adult Health

## 2012-05-26 LAB — COMPREHENSIVE METABOLIC PANEL
ALT: 16 U/L (ref 0–35)
AST: 14 U/L (ref 0–37)
Albumin: 4.1 g/dL (ref 3.5–5.2)
Alkaline Phosphatase: 84 U/L (ref 39–117)
Glucose, Bld: 85 mg/dL (ref 70–99)
Potassium: 3.7 mEq/L (ref 3.5–5.3)
Sodium: 140 mEq/L (ref 135–145)
Total Protein: 6.7 g/dL (ref 6.0–8.3)

## 2012-05-26 LAB — TSH: TSH: 2.515 u[IU]/mL (ref 0.350–4.500)

## 2012-05-26 NOTE — Telephone Encounter (Signed)
Left message labs normal

## 2012-06-03 ENCOUNTER — Encounter: Payer: Self-pay | Admitting: *Deleted

## 2012-06-07 ENCOUNTER — Encounter: Payer: Self-pay | Admitting: Obstetrics and Gynecology

## 2012-06-07 ENCOUNTER — Ambulatory Visit (INDEPENDENT_AMBULATORY_CARE_PROVIDER_SITE_OTHER): Payer: BC Managed Care – PPO | Admitting: Obstetrics and Gynecology

## 2012-06-07 VITALS — BP 128/80 | Ht 64.0 in | Wt 238.0 lb

## 2012-06-07 DIAGNOSIS — N920 Excessive and frequent menstruation with regular cycle: Secondary | ICD-10-CM

## 2012-06-07 DIAGNOSIS — N970 Female infertility associated with anovulation: Secondary | ICD-10-CM | POA: Insufficient documentation

## 2012-06-07 MED ORDER — MEDROXYPROGESTERONE ACETATE 10 MG PO TABS: 10.0000 mg | ORAL_TABLET | Freq: Every day | ORAL | Status: DC

## 2012-06-07 NOTE — Progress Notes (Signed)
  Assessment:  anovulation, infertility  dysfunctional uterine bleeding treated with Megace   Plan:  Stop Megace Provera 10 mg daily x10 days and expect withdrawal menses   Subjective:  Kathleen Velasquez is a 22 y.o. female, No obstetric history on file., who presents for fertility advice. . previous diagnoses of anovulation 2 years newly and is living together desires pregnancy. Previous workup 2 years injection in addition Partner age 55 and 2 prior children prior relationship   Review of Systems Pertinent items are noted in HPI.  GYN: Patient's last menstrual period was 05/16/2012. lives 05/16/2012 to 06/06/2012 despite Megace.  Objective:  BP 128/80  Ht 5\' 4"  (1.626 m)  Wt 238 lb (107.956 kg)  BMI 40.83 kg/m2  LMP 05/16/2012    BMI: Body mass index is 40.83 kg/(m^2).

## 2012-06-07 NOTE — Patient Instructions (Addendum)
Stop Megace begin Provera daily from 10 days. Expect yourself to have a normal menstrual period beginning about 2 days after you have finished the Provera

## 2012-06-14 ENCOUNTER — Telehealth: Payer: Self-pay | Admitting: Obstetrics and Gynecology

## 2012-06-14 NOTE — Telephone Encounter (Signed)
Pt states Dr. Emelda Fear gave pt provera for 10 days to help regulate menstrual cycle, however pt has continued to have vaginal bleed at day 7 of taking provera. Per Dr. Emelda Fear pt to stop provera keep her appointmentt for June 29, 2012 to follow up. Pt verbalized understanding and stated would call back office back when she started her regular menses.

## 2012-06-17 ENCOUNTER — Telehealth: Payer: Self-pay | Admitting: *Deleted

## 2012-06-21 ENCOUNTER — Telehealth: Payer: Self-pay | Admitting: *Deleted

## 2012-06-21 MED ORDER — CLOMIPHENE CITRATE 50 MG PO TABS: 50.0000 mg | ORAL_TABLET | Freq: Every day | ORAL | Status: DC

## 2012-06-21 NOTE — Telephone Encounter (Signed)
Given verbal order by phone by Dr. Emelda Fear  for Clomid 50 mg 1 tablet po daily x 5 days with 1 refill.

## 2012-06-21 NOTE — Telephone Encounter (Signed)
Clomid e-scribed

## 2012-06-21 NOTE — Telephone Encounter (Signed)
Pt informed Clomid e-scribed to start medication between day 5-9 of menstrual cycle per Dr. Emelda Fear.

## 2012-06-21 NOTE — Telephone Encounter (Signed)
Pt states has not received prescription for clomid, when does she need to start clomid, would like to discuss with Dr. Emelda Fear. Pt aware Dr. Emelda Fear out of office today due to surgery, will try to page.

## 2012-06-23 ENCOUNTER — Telehealth: Payer: Self-pay | Admitting: Adult Health

## 2012-06-23 NOTE — Telephone Encounter (Signed)
Pt states day 7 of period continues to have heavy bleeding, which is abnormal for her. She states has started taken clomid. Does this pt need to be seen? Pt does have an appt on June 20,2014.

## 2012-06-29 ENCOUNTER — Ambulatory Visit: Payer: BC Managed Care – PPO | Admitting: Obstetrics and Gynecology

## 2012-07-01 ENCOUNTER — Ambulatory Visit (INDEPENDENT_AMBULATORY_CARE_PROVIDER_SITE_OTHER): Payer: BC Managed Care – PPO | Admitting: Obstetrics and Gynecology

## 2012-07-01 ENCOUNTER — Encounter: Payer: Self-pay | Admitting: Obstetrics and Gynecology

## 2012-07-01 VITALS — BP 120/80 | Ht 64.0 in | Wt 236.6 lb

## 2012-07-01 DIAGNOSIS — N970 Female infertility associated with anovulation: Secondary | ICD-10-CM

## 2012-07-01 NOTE — Patient Instructions (Signed)
Serum progesterone level next Thursday 6/26

## 2012-07-01 NOTE — Progress Notes (Signed)
Patient ID: Kathleen Velasquez, female   DOB: [birth year], 22 y.o.   MRN: [id number]   [facility] Visit  Patient name: Kathleen Velasquez MRN [id number]  Date of birth: 1990/11/20  CC & HPI:  Kathleen Velasquez is a 22 y.o. female presenting today for discuss fertility. Pt using [email or web address] website useful Pt here today to follow up on clomid and blood work. Pt denies any problems at this time. This months period lasted from 06/16/12 to 06/25/12. Has some questions regarding fertility issues.   ROS:  Had slightly erratic use of Megace  Stopped it ,final use was 6/15.  Pertinent History Reviewed:  Medical & Surgical Hx:  Reviewed: Significant for  Medications: Reviewed & Updated - see associated section Social History: Reviewed -  reports that she has been smoking Cigarettes.  She has a 5 pack-year smoking history. She has never used smokeless tobacco. less than 1/2 ppd. Unable to do nicotine patches.  Objective Findings:  Vitals: BP 120/80  Ht 5\' 4"  (1.626 m)  Wt 236 lb 9.6 oz (107.321 kg)  BMI 40.59 kg/m2  LMP 06/16/2012  Physical Examination: General appearance -  Mental status - alert, oriented to person, place, and time    Assessment & Plan:   Chronic anovulation Plan:  Clomid cycle this month. Check serum progesterone 6/26 Next Thursday.

## 2012-07-07 ENCOUNTER — Telehealth: Payer: Self-pay | Admitting: Obstetrics and Gynecology

## 2012-07-07 ENCOUNTER — Other Ambulatory Visit: Payer: BC Managed Care – PPO

## 2012-07-07 DIAGNOSIS — IMO0002 Reserved for concepts with insufficient information to code with codable children: Secondary | ICD-10-CM

## 2012-07-07 NOTE — Telephone Encounter (Signed)
Kathleen Velasquez states had progesterone blood work today and Dr. Emelda Fear had agreed to contact the Kathleen Velasquez tomorrow and discuss results and plan by phone. Kathleen Velasquez requesting Dr. Emelda Fear to call her before 2 pm due to having to work tomorrow 2-10 pm. Call Kathleen Velasquez at her home number in Epic.  Informed Kathleen Velasquez will route message to Dr. Emelda Fear.

## 2012-07-08 ENCOUNTER — Telehealth: Payer: Self-pay | Admitting: Obstetrics and Gynecology

## 2012-07-08 NOTE — Telephone Encounter (Signed)
Spoke with pt. Progesterone is 0.3. Spoke with Dr. Emelda Fear. He states pt did not ovulate and keep the appt for July 9. If she starts her period before July 9, call office for Clomid instructions.

## 2012-07-10 ENCOUNTER — Telehealth: Payer: Self-pay | Admitting: Obstetrics and Gynecology

## 2012-07-10 DIAGNOSIS — N970 Female infertility associated with anovulation: Secondary | ICD-10-CM

## 2012-07-10 MED ORDER — CLOMIPHENE CITRATE 50 MG PO TABS: 100.0000 mg | ORAL_TABLET | Freq: Every day | ORAL | Status: DC

## 2012-07-10 NOTE — Telephone Encounter (Signed)
Pt anovulatory on clomid 50 mg daily. Pt has appt July 9  Pt will need to be incr to 100 mg for days 3-7.

## 2012-07-11 ENCOUNTER — Telehealth: Payer: Self-pay | Admitting: Obstetrics and Gynecology

## 2012-07-11 NOTE — Telephone Encounter (Signed)
Dr Emelda Fear took care of

## 2012-07-12 NOTE — Telephone Encounter (Signed)
Patient called 6/29. Discussed results of progesterone. Pt has followup appt. Pt to call if she begins menses prior to office visit.

## 2012-07-13 ENCOUNTER — Telehealth: Payer: Self-pay | Admitting: Obstetrics and Gynecology

## 2012-07-14 NOTE — Telephone Encounter (Signed)
Discussed by phone. Pt had lengthy question list ; advised to make f/u appt prn questions. No clomid now, pt having light brown spotting for now.

## 2012-07-19 ENCOUNTER — Telehealth: Payer: Self-pay | Admitting: Adult Health

## 2012-07-19 NOTE — Telephone Encounter (Signed)
Note for the Dr.'s [facility] nurse; pt left a message stating that she started her period 7/7 spotting, light bleeding. She just wants the Dr to know about her period. Pt also left a message for [place] regarding her medication. Pt said that Victorino Dike will help her.

## 2012-07-19 NOTE — Telephone Encounter (Signed)
Told her to check with [employer] [facility] [employer] on clomid

## 2012-07-19 NOTE — Telephone Encounter (Signed)
No answer will  try agian

## 2012-07-20 ENCOUNTER — Telehealth: Payer: Self-pay | Admitting: Obstetrics and Gynecology

## 2012-07-20 ENCOUNTER — Ambulatory Visit: Payer: BC Managed Care – PPO | Admitting: Obstetrics and Gynecology

## 2012-07-20 NOTE — Telephone Encounter (Signed)
Left message x 1. JSY 

## 2012-07-21 ENCOUNTER — Ambulatory Visit (INDEPENDENT_AMBULATORY_CARE_PROVIDER_SITE_OTHER): Payer: BC Managed Care – PPO | Admitting: Obstetrics and Gynecology

## 2012-07-21 ENCOUNTER — Encounter: Payer: Self-pay | Admitting: Obstetrics and Gynecology

## 2012-07-21 VITALS — BP 120/64 | Ht 64.0 in | Wt 233.2 lb

## 2012-07-21 DIAGNOSIS — N970 Female infertility associated with anovulation: Secondary | ICD-10-CM

## 2012-07-21 MED ORDER — CLOMIPHENE CITRATE 50 MG PO TABS: 100.0000 mg | ORAL_TABLET | Freq: Every day | ORAL | Status: DC

## 2012-07-21 NOTE — Progress Notes (Signed)
Patient ID: Kathleen Velasquez, female   DOB: 02/10/1990, 22 y.o.   MRN: [id number] Pt here today to discuss infertility and do blood work. Pt states period started 07/18/2012.   [facility] Visit  Patient name: Kathleen Velasquez MRN [id number]  Date of birth: [birth year]  CC & HPI:  Kathleen Velasquez is a 22 y.o. female presenting today for followup fertility Now day 3 of heavy menses that began 7/8, after 3 days of lite spotting brown in color.  Had progest level 26 June that showed anovulation levels.  ROS:    Pertinent History Reviewed:  Medical & Surgical Hx:  Reviewed: Significant for  Medications: Reviewed & Updated - see associated section Social History: Reviewed -  reports that she has been smoking Cigarettes.  She has a 2.5 pack-year smoking history. She has never used smokeless tobacco.  Objective Findings:  Vitals: BP 120/64  Ht 5\' 4"  (1.626 m)  Wt 233 lb 3.2 oz (105.779 kg)  BMI 40.01 kg/m2  LMP 07/18/2012  Physical Examination:    Assessment & Plan:   Anovulation cycles Plan increase clomid to 100 mg days 3-7 . Rx escribed to [facility].

## 2012-07-21 NOTE — Patient Instructions (Signed)
Begin clomid today x 5 days

## 2012-07-21 NOTE — Telephone Encounter (Signed)
Spoke with pt. Trying to get pregnant. On Clomid. Had brown bleeding last week. Monday it changed to red and Tuesday was a full blown period. She is unsure what day would be the start day. Also, when to start Clomid again. Has scheduled appt tomorrow. Please advise!!!!! Thanks!!!!!

## 2012-07-21 NOTE — Telephone Encounter (Signed)
Pt had an appt with Dr. Emelda Fear today

## 2012-07-22 ENCOUNTER — Ambulatory Visit: Payer: BC Managed Care – PPO | Admitting: Obstetrics and Gynecology

## 2012-08-08 ENCOUNTER — Other Ambulatory Visit: Payer: BC Managed Care – PPO

## 2012-08-08 DIAGNOSIS — IMO0002 Reserved for concepts with insufficient information to code with codable children: Secondary | ICD-10-CM

## 2012-08-09 ENCOUNTER — Telehealth: Payer: Self-pay | Admitting: Obstetrics and Gynecology

## 2012-08-09 NOTE — Telephone Encounter (Signed)
Pt aware of progesterone level of 21.4

## 2012-08-18 ENCOUNTER — Telehealth: Payer: Self-pay | Admitting: Obstetrics & Gynecology

## 2012-08-18 ENCOUNTER — Telehealth: Payer: Self-pay | Admitting: *Deleted

## 2012-08-18 MED ORDER — CLOMIPHENE CITRATE 50 MG PO TABS: 100.0000 mg | ORAL_TABLET | Freq: Every day | ORAL | Status: DC

## 2012-08-18 NOTE — Telephone Encounter (Signed)
Pt states was told to call our office when she started her period. Today is day 3 of period. Took clomid last month, please advise.

## 2012-08-18 NOTE — Telephone Encounter (Signed)
Pt needed refill on clomid, will do at [facility], has appt 8/13 with JVF, needs to check progesterone day 21 of cylce.

## 2012-08-24 ENCOUNTER — Encounter: Payer: Self-pay | Admitting: Obstetrics and Gynecology

## 2012-08-24 ENCOUNTER — Ambulatory Visit (INDEPENDENT_AMBULATORY_CARE_PROVIDER_SITE_OTHER): Payer: BC Managed Care – PPO | Admitting: Obstetrics and Gynecology

## 2012-08-24 VITALS — BP 118/60 | Ht 64.0 in | Wt 233.0 lb

## 2012-08-24 DIAGNOSIS — Z331 Pregnant state, incidental: Secondary | ICD-10-CM

## 2012-08-24 DIAGNOSIS — Z1389 Encounter for screening for other disorder: Secondary | ICD-10-CM

## 2012-08-24 DIAGNOSIS — N970 Female infertility associated with anovulation: Secondary | ICD-10-CM

## 2012-08-24 LAB — POCT URINALYSIS DIPSTICK
Glucose, UA: NEGATIVE
Ketones, UA: NEGATIVE

## 2012-08-24 MED ORDER — CLOMIPHENE CITRATE 50 MG PO TABS: 100.0000 mg | ORAL_TABLET | Freq: Every day | ORAL | Status: DC

## 2012-08-24 NOTE — Patient Instructions (Addendum)
Continue current doses clomid. Use fertility friend information. F/u 3 months.

## 2012-08-24 NOTE — Progress Notes (Signed)
Patient ID: Kathleen Velasquez, female   DOB: 1990-12-11, 22 y.o.   MRN: [id number]    [facility] Visit  Patient name: Kathleen Velasquez MRN [id number]  Date of birth: 30-Mar-1990  CC & HPI:   Pt here today to discuss fertility. Pt does c/o urinary frequency.LMP 5 August-10Aug PMP 5 July x7d Had ovulatory levels Progesterone, 21.4 on 7/28(day 21)   ROS:  Pt aware of fertile days. Had sex qod during fertile times last cycle  Pertinent History Reviewed:  Medical & Surgical Hx:  Reviewed: Significant for  Medications: Reviewed & Updated - see associated section Social History: Reviewed -  reports that she has been smoking Cigarettes.  She has a 2.5 pack-year smoking history. She has never used smokeless tobacco.  Objective Findings:  Vitals: BP 118/60  Ht 5\' 4"  (1.626 m)  Wt 233 lb (105.688 kg)  BMI 39.97 kg/m2  LMP 08/16/2012  Physical Examination: General appearance - alert, well appearing, and in no distress, oriented to person, place, and time, overweight, well hydrated and in mild to moderate distress Abdomen - soft, nontender, nondistended, no masses or organomegaly   Assessment & Plan:     Begin temp check Web site: fertilityfriend.com in use. Continue clomid at current doses.

## 2012-09-06 ENCOUNTER — Other Ambulatory Visit: Payer: BC Managed Care – PPO

## 2012-09-07 ENCOUNTER — Other Ambulatory Visit: Payer: BC Managed Care – PPO

## 2012-09-07 DIAGNOSIS — IMO0002 Reserved for concepts with insufficient information to code with codable children: Secondary | ICD-10-CM

## 2012-09-08 LAB — PROGESTERONE: Progesterone: 21.2 ng/mL

## 2012-09-15 ENCOUNTER — Encounter (HOSPITAL_COMMUNITY): Payer: Self-pay | Admitting: *Deleted

## 2012-09-15 ENCOUNTER — Emergency Department (HOSPITAL_COMMUNITY)
Admission: EM | Admit: 2012-09-15 | Discharge: 2012-09-15 | Disposition: A | Discharge status: Home / Self Care | Payer: BC Managed Care – PPO | Attending: Emergency Medicine | Admitting: Emergency Medicine

## 2012-09-15 DIAGNOSIS — Z872 Personal history of diseases of the skin and subcutaneous tissue: Secondary | ICD-10-CM | POA: Insufficient documentation

## 2012-09-15 DIAGNOSIS — N939 Abnormal uterine and vaginal bleeding, unspecified: Secondary | ICD-10-CM

## 2012-09-15 DIAGNOSIS — R51 Headache: Secondary | ICD-10-CM | POA: Insufficient documentation

## 2012-09-15 DIAGNOSIS — Z3202 Encounter for pregnancy test, result negative: Secondary | ICD-10-CM | POA: Insufficient documentation

## 2012-09-15 DIAGNOSIS — R109 Unspecified abdominal pain: Secondary | ICD-10-CM | POA: Insufficient documentation

## 2012-09-15 DIAGNOSIS — Z880 Allergy status to penicillin: Secondary | ICD-10-CM | POA: Insufficient documentation

## 2012-09-15 DIAGNOSIS — N898 Other specified noninflammatory disorders of vagina: Secondary | ICD-10-CM | POA: Insufficient documentation

## 2012-09-15 DIAGNOSIS — F17200 Nicotine dependence, unspecified, uncomplicated: Secondary | ICD-10-CM | POA: Insufficient documentation

## 2012-09-15 LAB — CBC WITH DIFFERENTIAL/PLATELET
Basophils Absolute: 0 10*3/uL (ref 0.0–0.1)
Lymphocytes Relative: 39 % (ref 12–46)
Lymphs Abs: 3.5 10*3/uL (ref 0.7–4.0)
Neutro Abs: 4.7 10*3/uL (ref 1.7–7.7)
Neutrophils Relative %: 53 % (ref 43–77)
Platelets: 311 10*3/uL (ref 150–400)
RBC: 4.51 MIL/uL (ref 3.87–5.11)
WBC: 8.9 10*3/uL (ref 4.0–10.5)

## 2012-09-15 LAB — HCG, QUANTITATIVE, PREGNANCY: hCG, Beta Chain, Quant, S: <1 m[IU]/mL (ref <5)

## 2012-09-15 NOTE — ED Provider Notes (Signed)
CSN: [id number]     Arrival date & time 09/15/12  2108 History  This chart was scribed for Geoffery Lyons, MD by Bennett Scrape, ED Scribe. This patient was seen in room APA04/APA04 and the patient's care was started at 9:28 PM.   Chief Complaint  Patient presents with  . Vaginal Bleeding    The history is provided by the patient. No language interpreter was used.    HPI Comments: Kathleen Velasquez is a 22 y.o. female who presents to the Emergency Department complaining of non-changing, constant heavy vaginal bleeding that started around 3 AM this morning. She states that she used 2 tampons and 4 pads in 5 hours today. She also reports clots bigger than a 50 cent piece when urinating which is larger than normal. She states that she was seen by Dr. Delbert Harness and is currently on Clomid to kick start ovulation. She was started in June 2014 on 50 mg with no ovulation and was increased to 100 mg in July 2014 with ovulation. Since then she has been having unprotected sex in an attempt to become pregnant. She states that she was supposed to take a pregnancy test 2 days ago but started bleeding and did not take it. She reports associated abdominal cramping with sharp pains in the suprapubic regions that resolve after 5 minutes and generalized HA but states that this is normal for her menses. She reports taking Tylenol with no relief. She denies having any nausea, emesis, diarrhea, acute abdominal pain and urinary symptoms as associated symptoms.   Past Medical History  Diagnosis Date  . Abscess     right buttocks/thigh  . DUB (dysfunctional uterine bleeding) 05/25/2012   Past Surgical History  Procedure Laterality Date  . Hernia repair    . Tonsillectomy    . Adenoidectomy    . Hydradenitis excision  10/02/2011    Procedure: EXCISION HYDRADENITIS AXILLA;  Surgeon: Fabio Bering, MD;  Location: [facility];  Service: General;  Laterality: Right;  Right Axillary Dissection  . Cholecystectomy  01/27/2012   Procedure: LAPAROSCOPIC CHOLECYSTECTOMY;  Surgeon: Fabio Bering, MD;  Location: [facility];  Service: General;  Laterality: N/A;   Family History  Problem Relation Age of Onset  . Diabetes Mother   . Hypertension Mother   . Hyperlipidemia Mother   . Diabetes Maternal Grandmother   . Hypertension Maternal Grandmother   . Heart disease Maternal Grandmother     CHF  . COPD Maternal Grandmother   . Cirrhosis Maternal Grandmother   . Cancer Maternal Grandfather     lung  . Diabetes Paternal Grandmother   . Diabetes Paternal Grandfather   . Aneurysm Paternal Grandfather   . Stroke Other    History  Substance Use Topics  . Smoking status: Current Every Day Smoker -- 0.50 packs/day for 5 years    Types: Cigarettes  . Smokeless tobacco: Never Used  . Alcohol Use: No     Comment: social use   No OB history provided.  Review of Systems  Gastrointestinal: Positive for abdominal pain. Negative for nausea and vomiting.  Genitourinary: Positive for vaginal bleeding. Negative for dysuria and hematuria.  Neurological: Positive for headaches. Negative for syncope.  All other systems reviewed and are negative.    Allergies  Bactrim; Ciprofloxacin; Other; Penicillins; and Percocet  Home Medications   Current Outpatient Rx  Name  Route  Sig  Dispense  Refill  . acetaminophen (TYLENOL) 325 MG tablet   Oral   Take 650  mg by mouth every 6 (six) hours as needed for pain.         . clomiPHENE (CLOMID) 50 MG tablet   Oral   Take 2 tablets (100 mg total) by mouth daily. On days 3-7 of cycle   10 tablet   3    Triage Vitals: BP 131/78  Pulse 85  Temp(Src) 99.1 F (37.3 C) (Oral)  Resp 20  Ht 5\' 4"  (1.626 m)  Wt 228 lb (103.42 kg)  BMI 39.12 kg/m2  SpO2 99%  LMP 08/16/2012  Physical Exam  Nursing note and vitals reviewed. Constitutional: She is oriented to person, place, and time. She appears well-developed and well-nourished. No distress.  HENT:  Head: Normocephalic and  atraumatic.  Eyes: Conjunctivae and EOM are normal.  Neck: Normal range of motion. Neck supple. No tracheal deviation present.  Cardiovascular: Normal rate, regular rhythm and normal heart sounds.   No murmur heard. Pulmonary/Chest: Effort normal and breath sounds normal. No respiratory distress. She has no wheezes. She has no rales.  Abdominal: Soft. Bowel sounds are normal. There is no tenderness.  Musculoskeletal: Normal range of motion. She exhibits no edema.  Neurological: She is alert and oriented to person, place, and time. No cranial nerve deficit.  Skin: Skin is warm and dry.  Psychiatric: She has a normal mood and affect. Her behavior is normal.    ED Course  Procedures (including critical care time)  DIAGNOSTIC STUDIES: Oxygen Saturation is 99% on room air, normal by my interpretation.    COORDINATION OF CARE: 9:33 PM-Discussed treatment plan which includes CBC panel, pregnancy and UA with pt at bedside and pt agreed to plan.   Labs Review Labs Reviewed - No data to display Imaging Review No results found.  MDM  No diagnosis found. Patient presents with vaginal bleeding since earlier today she is receiving fertility treatments from Dr. Delbert Harness. Workup reveals a normal hemoglobin and she is hemodynamically stable. She is not pregnant thus ruling out an ectopic. At this point I feel as though she is appropriate for discharge we have recommended she call Dr. Delbert Harness in the morning to discuss.  I personally performed the services described in this documentation, which was scribed in my presence. The recorded information has been reviewed and is accurate.      Geoffery Lyons, MD 09/15/12 2238

## 2012-09-15 NOTE — ED Notes (Signed)
Vag bleeding with clots, and cramping. Headache.  Taking clomid

## 2012-09-16 ENCOUNTER — Inpatient Hospital Stay (HOSPITAL_COMMUNITY)
Admission: AD | Admit: 2012-09-16 | Discharge: 2012-09-16 | Disposition: A | Discharge status: Home / Self Care | Payer: BC Managed Care – PPO | Source: Ambulatory Visit | Attending: Obstetrics & Gynecology | Admitting: Obstetrics & Gynecology

## 2012-09-16 ENCOUNTER — Telehealth: Payer: Self-pay | Admitting: Obstetrics & Gynecology

## 2012-09-16 ENCOUNTER — Encounter (HOSPITAL_COMMUNITY): Payer: Self-pay | Admitting: *Deleted

## 2012-09-16 DIAGNOSIS — N920 Excessive and frequent menstruation with regular cycle: Secondary | ICD-10-CM

## 2012-09-16 LAB — URINALYSIS, ROUTINE W REFLEX MICROSCOPIC
Bilirubin Urine: NEGATIVE
Ketones, ur: NEGATIVE mg/dL
Specific Gravity, Urine: >1.03 — ABNORMAL HIGH (ref 1.005–1.030)
Urobilinogen, UA: 0.2 mg/dL (ref 0.0–1.0)

## 2012-09-16 LAB — URINE MICROSCOPIC-ADD ON

## 2012-09-16 LAB — HEMOGLOBIN AND HEMATOCRIT, BLOOD: Hemoglobin: 13.1 g/dL (ref 12.0–15.0)

## 2012-09-16 NOTE — MAU Note (Signed)
Patient states she started her period on 9-2. Has been getting heavier but have very heavy yesterday with clots. Was seen at [facility] on 9-4. Having cramping and has sharp pain around ovaries, uterus and vagina. Has hot flashes.

## 2012-09-16 NOTE — MAU Provider Note (Signed)
Attestation of Attending Supervision of Advanced Practitioner (PA/CNM/NP): Evaluation and management procedures were performed by the Advanced Practitioner under my supervision and collaboration.  I have reviewed the Advanced Practitioner's note and chart, and I agree with the management and plan.  Reda Citron, MD, FACOG Attending Obstetrician & Gynecologist Faculty Practice, [facility]  

## 2012-09-16 NOTE — Telephone Encounter (Signed)
Spoke with pt. On Clomid and is bleeding heavy. Started period on Tuesday. Has been on Clomid since June. Went to [facility] ER last pm and is going to [facility] now due to the bleeding. Feels weak. Pt will call next week to schedule follow up. JSY

## 2012-09-16 NOTE — MAU Provider Note (Signed)
History     CSN: [id number]  Arrival date and time: 09/16/12 1406   First Provider Initiated Contact with Patient 09/16/12 1821      Chief Complaint  Patient presents with  . Vaginal Bleeding   HPI Kathleen Velasquez is 22 y.o. G0P0 presents with heavy vaginal bleeding. With clots. States she went through 14 pads in 12 hrs.  he is a patient of Dr. Rayna Sexton. PMP 08/16/12 and LMP 9/2--a few days earlier than expected.  She states she does not ovulate and is on Clomid 100mg  5-9 of her cycle to become pregnancy.  She called the oncall RN yesterday and instructed to go [facility], which she did.  She states they drew blood but was unable to get UA because of the heavier bleeding.   HGB was 13.3 and BHCG was <1.   Told to have follow up with Dr. Emelda Fear.  She called the office today and was instructed to come here for further evaluation.  States the bleeding is less today but still heavier and more clots than normal.  Reports dull cramping and occasional sharp pain in the vaginal.   Past Medical History  Diagnosis Date  . Abscess     right buttocks/thigh  . DUB (dysfunctional uterine bleeding) 05/25/2012    Past Surgical History  Procedure Laterality Date  . Hernia repair    . Tonsillectomy    . Adenoidectomy    . Hydradenitis excision  10/02/2011    Procedure: EXCISION HYDRADENITIS AXILLA;  Surgeon: Fabio Bering, MD;  Location: [facility];  Service: General;  Laterality: Right;  Right Axillary Dissection  . Cholecystectomy  01/27/2012    Procedure: LAPAROSCOPIC CHOLECYSTECTOMY;  Surgeon: Fabio Bering, MD;  Location: [facility];  Service: General;  Laterality: N/A;    Family History  Problem Relation Age of Onset  . Diabetes Mother   . Hypertension Mother   . Hyperlipidemia Mother   . Diabetes Maternal Grandmother   . Hypertension Maternal Grandmother   . Heart disease Maternal Grandmother     CHF  . COPD Maternal Grandmother   . Cirrhosis Maternal Grandmother   . Cancer Maternal  Grandfather     lung  . Diabetes Paternal Grandmother   . Diabetes Paternal Grandfather   . Aneurysm Paternal Grandfather   . Stroke Other     History  Substance Use Topics  . Smoking status: Current Every Day Smoker -- 0.50 packs/day for 5 years    Types: Cigarettes  . Smokeless tobacco: Never Used  . Alcohol Use: No     Comment: social use    Allergies:  Allergies  Allergen Reactions  . Bactrim Hives and Itching  . Ciprofloxacin Hives and Itching    CRNA Discontinued preop antibiotic  IVPB  Cipro in OR after developed itching and hives left arm.  Received Benadryl with relief noted. Dr.Gonzalez and Dr. Leticia Penna informed. DDallasRN  . Other Other (See Comments)    [place] causes a hives, swelling, itching, and anaphylaxis.   Marland Kitchen Penicillins Hives and Itching  . Percocet [Oxycodone-Acetaminophen] Other (See Comments)    Face flushes    Prescriptions prior to admission  Medication Sig Dispense Refill  . acetaminophen (TYLENOL) 325 MG tablet Take 650 mg by mouth every 6 (six) hours as needed for pain.      . clomiPHENE (CLOMID) 50 MG tablet Take 2 tablets (100 mg total) by mouth daily. On days 3-7 of cycle  10 tablet  3    Review  of Systems  Constitutional: Negative for fever and chills.  Gastrointestinal: Positive for abdominal pain (mild cramping, occ sharp vaginal pain).  Genitourinary: Negative for dysuria, urgency and frequency.       Heavy vaginal bleeding with clots   Physical Exam   Blood pressure 112/64, pulse 80, temperature 98.1 F (36.7 C), temperature source Oral, resp. rate 16, height 5' 4.5" (1.638 m), weight 234 lb 9.6 oz (106.414 kg), last menstrual period 09/13/2012, SpO2 100.00%.  Physical Exam  Constitutional: She appears well-developed and well-nourished. No distress.  HENT:  Head: Normocephalic.  Neck: Normal range of motion.  Cardiovascular: Normal rate.   Respiratory: Effort normal.  GI: Soft. She exhibits no distension and no mass. There is no  tenderness. There is no rebound and no guarding.  Genitourinary: There is no rash, tenderness or lesion on the right labia. There is no rash, tenderness or lesion on the left labia. Uterus is not enlarged and not tender. Right adnexum displays no mass, no tenderness and no fullness. Left adnexum displays no mass, no tenderness and no fullness. There is bleeding (small amount of vaginal bleeding with several small clots.  ) around the vagina.   Results for orders placed during the hospital encounter of 09/16/12 (from the past 48 hour(s))  URINALYSIS, ROUTINE W REFLEX MICROSCOPIC     Status: Abnormal   Collection Time    09/16/12  3:35 PM      Result Value Range   Color, Urine YELLOW  YELLOW   APPearance HAZY (*) CLEAR   Specific Gravity, Urine >1.030 (*) 1.005 - 1.030   pH 6.0  5.0 - 8.0   Glucose, UA NEGATIVE  NEGATIVE mg/dL   Hgb urine dipstick LARGE (*) NEGATIVE   Bilirubin Urine NEGATIVE  NEGATIVE   Ketones, ur NEGATIVE  NEGATIVE mg/dL   Protein, ur NEGATIVE  NEGATIVE mg/dL   Urobilinogen, UA 0.2  0.0 - 1.0 mg/dL   Nitrite NEGATIVE  NEGATIVE   Leukocytes, UA NEGATIVE  NEGATIVE  URINE MICROSCOPIC-ADD ON     Status: Abnormal   Collection Time    09/16/12  3:35 PM      Result Value Range   Squamous Epithelial / LPF FEW (*) RARE   RBC / HPF 21-50  <3 RBC/hpf  HEMOGLOBIN AND HEMATOCRIT, BLOOD     Status: None   Collection Time    09/16/12  7:10 PM      Result Value Range   Hemoglobin 13.1  12.0 - 15.0 g/dL   HCT 16.1  09.6 - 04.5 %   MAU Course  Procedures  MDM  Assessment and Plan  A:  Menorrhagia       P:  Instructed to call Dr. Rayna Sexton office on [facility] for follow up     Continue Clomid as directed     Reassured her that HGB is stable.   Lyanna Blystone,EVE M 09/16/2012, 6:21 PM

## 2012-10-13 ENCOUNTER — Encounter (HOSPITAL_COMMUNITY): Payer: Self-pay | Admitting: *Deleted

## 2012-10-13 ENCOUNTER — Emergency Department (HOSPITAL_COMMUNITY)
Admission: EM | Admit: 2012-10-13 | Discharge: 2012-10-13 | Disposition: A | Discharge status: Home / Self Care | Payer: BC Managed Care – PPO | Attending: Emergency Medicine | Admitting: Emergency Medicine

## 2012-10-13 DIAGNOSIS — L732 Hidradenitis suppurativa: Secondary | ICD-10-CM | POA: Insufficient documentation

## 2012-10-13 DIAGNOSIS — F17200 Nicotine dependence, unspecified, uncomplicated: Secondary | ICD-10-CM | POA: Insufficient documentation

## 2012-10-13 DIAGNOSIS — Z79899 Other long term (current) drug therapy: Secondary | ICD-10-CM | POA: Insufficient documentation

## 2012-10-13 DIAGNOSIS — J029 Acute pharyngitis, unspecified: Secondary | ICD-10-CM | POA: Insufficient documentation

## 2012-10-13 DIAGNOSIS — Z792 Long term (current) use of antibiotics: Secondary | ICD-10-CM | POA: Insufficient documentation

## 2012-10-13 DIAGNOSIS — Z8742 Personal history of other diseases of the female genital tract: Secondary | ICD-10-CM | POA: Insufficient documentation

## 2012-10-13 DIAGNOSIS — J40 Bronchitis, not specified as acute or chronic: Secondary | ICD-10-CM | POA: Insufficient documentation

## 2012-10-13 DIAGNOSIS — Z880 Allergy status to penicillin: Secondary | ICD-10-CM | POA: Insufficient documentation

## 2012-10-13 MED ORDER — BENZONATATE 100 MG PO CAPS: 200.0000 mg | ORAL_CAPSULE | Freq: Three times a day (TID) | ORAL | Status: DC | PRN

## 2012-10-13 MED ORDER — HYDROCODONE-ACETAMINOPHEN 5-325 MG PO TABS: 1.0000 | ORAL_TABLET | ORAL | Status: DC | PRN

## 2012-10-13 MED ORDER — ALBUTEROL SULFATE HFA 108 (90 BASE) MCG/ACT IN AERS: 1.0000 | INHALATION_SPRAY | Freq: Four times a day (QID) | RESPIRATORY_TRACT | Status: DC | PRN

## 2012-10-13 MED ORDER — HYDROCODONE-ACETAMINOPHEN 5-325 MG PO TABS
1.0000 | ORAL_TABLET | Freq: Once | ORAL | Status: AC
  Administered 2012-10-13: 1 via ORAL
  Filled 2012-10-13: qty 1

## 2012-10-13 MED ORDER — BENZONATATE 100 MG PO CAPS
200.0000 mg | ORAL_CAPSULE | Freq: Once | ORAL | Status: AC
  Administered 2012-10-13: 200 mg via ORAL
  Filled 2012-10-13: qty 2

## 2012-10-13 MED ORDER — DOXYCYCLINE HYCLATE 100 MG PO TABS
100.0000 mg | ORAL_TABLET | Freq: Once | ORAL | Status: AC
  Administered 2012-10-13: 100 mg via ORAL
  Filled 2012-10-13: qty 1

## 2012-10-13 MED ORDER — DOXYCYCLINE HYCLATE 100 MG PO CAPS: 100.0000 mg | ORAL_CAPSULE | Freq: Two times a day (BID) | ORAL | Status: DC

## 2012-10-13 NOTE — ED Notes (Signed)
Pt seen and evaluated by EDPa for initial assessment. 

## 2012-10-13 NOTE — ED Provider Notes (Signed)
CSN: [id number]     Arrival date & time 10/13/12  1907 History   First MD Initiated Contact with Patient 10/13/12 1917     Chief Complaint  Patient presents with  . Abscess  . Cough   (Consider location/radiation/quality/duration/timing/severity/associated sxs/prior Treatment) HPI Comments: Kathleen Velasquez is a 22 y.o. Female presenting with small, newly developing abscesses in her bilateral axilla which first started 2 days ago.  She has a history of hidradenitis suppurativa and had bilateral surgical intervention of both axilla Jan 2014 by Dr. Leticia Penna.  Her left axilla has mild tenderness with the right axilla very tender. There has been no drainage from either side and no surrounding redness.  She has used warm compresses without relief.    Her second complaint is for persistent non productive cough for the past week.  She has a  History of frequent episodes of bronchitis and continues to smoke cigarettes.  She denies shortness of breath and chest pain, stating a "tickle in her throat" is the trigger for her coughing episodes which seems worse at night when her sinuses drain.  She also has occasional wheezes which are worse at night.  She blames a mild sore throat on the frequent coughing.  She has taken tylenol without relief of pain.     The history is provided by the patient.    Past Medical History  Diagnosis Date  . Abscess     right buttocks/thigh  . DUB (dysfunctional uterine bleeding) 05/25/2012   Past Surgical History  Procedure Laterality Date  . Hernia repair    . Tonsillectomy    . Adenoidectomy    . Hydradenitis excision  10/02/2011    Procedure: EXCISION HYDRADENITIS AXILLA;  Surgeon: Fabio Bering, MD;  Location: [facility];  Service: General;  Laterality: Right;  Right Axillary Dissection  . Cholecystectomy  01/27/2012    Procedure: LAPAROSCOPIC CHOLECYSTECTOMY;  Surgeon: Fabio Bering, MD;  Location: [facility];  Service: General;  Laterality: N/A;   Family History   Problem Relation Age of Onset  . Diabetes Mother   . Hypertension Mother   . Hyperlipidemia Mother   . Diabetes Maternal Grandmother   . Hypertension Maternal Grandmother   . Heart disease Maternal Grandmother     CHF  . COPD Maternal Grandmother   . Cirrhosis Maternal Grandmother   . Cancer Maternal Grandfather     lung  . Diabetes Paternal Grandmother   . Diabetes Paternal Grandfather   . Aneurysm Paternal Grandfather   . Stroke Other    History  Substance Use Topics  . Smoking status: Current Every Day Smoker -- 0.50 packs/day for 5 years    Types: Cigarettes  . Smokeless tobacco: Never Used  . Alcohol Use: No     Comment: social use   OB History   Grav Para Term Preterm Abortions TAB SAB Ect Mult Living   0              Review of Systems  Constitutional: Negative for fever and chills.  HENT: Positive for sore throat. Negative for ear pain, congestion, rhinorrhea, neck pain, postnasal drip and ear discharge.   Eyes: Negative.   Respiratory: Positive for cough and wheezing. Negative for chest tightness and shortness of breath.   Cardiovascular: Negative for chest pain.  Gastrointestinal: Negative for nausea and abdominal pain.  Genitourinary: Negative.   Musculoskeletal: Negative for joint swelling and arthralgias.  Skin: Negative.  Negative for rash and wound.  Neurological: Negative for  dizziness, weakness, light-headedness, numbness and headaches.  Psychiatric/Behavioral: Negative.     Allergies  Bactrim; Ciprofloxacin; Other; Penicillins; and Percocet  Home Medications   Current Outpatient Rx  Name  Route  Sig  Dispense  Refill  . acetaminophen (TYLENOL) 325 MG tablet   Oral   Take 650 mg by mouth every 6 (six) hours as needed for pain.         Marland Kitchen albuterol (PROVENTIL HFA;VENTOLIN HFA) 108 (90 BASE) MCG/ACT inhaler   Inhalation   Inhale 1-2 puffs into the lungs every 6 (six) hours as needed for wheezing.   1 Inhaler   0   . benzonatate (TESSALON)  100 MG capsule   Oral   Take 2 capsules (200 mg total) by mouth 3 (three) times daily as needed for cough.   21 capsule   0   . clomiPHENE (CLOMID) 50 MG tablet   Oral   Take 2 tablets (100 mg total) by mouth daily. On days 3-7 of cycle   10 tablet   3   . doxycycline (VIBRAMYCIN) 100 MG capsule   Oral   Take 1 capsule (100 mg total) by mouth 2 (two) times daily.   20 capsule   0   . HYDROcodone-acetaminophen (NORCO/VICODIN) 5-325 MG per tablet   Oral   Take 1 tablet by mouth every 4 (four) hours as needed for pain.   15 tablet   0    BP 121/81  Pulse 80  Temp(Src) 99.5 F (37.5 C) (Oral)  Resp 22  Ht 5\' 4"  (1.626 m)  Wt 223 lb (101.152 kg)  BMI 38.26 kg/m2  SpO2 99%  LMP 10/11/2012 Physical Exam  Nursing note and vitals reviewed. Constitutional: She appears well-developed and well-nourished.  HENT:  Head: Normocephalic and atraumatic.  Eyes: Conjunctivae are normal.  Neck: Normal range of motion.  Cardiovascular: Normal rate, regular rhythm, normal heart sounds and intact distal pulses.   Pulmonary/Chest: Effort normal and breath sounds normal. No respiratory distress. She has no decreased breath sounds. She has no wheezes. She has no rhonchi. She has no rales. She exhibits no tenderness.  Frequent dry cough during exam.  Lungs CTAB  Abdominal: Soft. Bowel sounds are normal. There is no tenderness.  Musculoskeletal: Normal range of motion.  Neurological: She is alert.  Skin: Skin is warm and dry.  Small papule left axilla,  Slightly raised,  No fluctuance or drainage.  Soft,  Slightly raised papule right axilla above well healed incision line.  No surrounding erythema, no fluctuance or palpable abscess.  Psychiatric: She has a normal mood and affect.    ED Course  Procedures (including critical care time) Labs Review Labs Reviewed - No data to display Imaging Review No results found.  MDM   1. Hidradenitis suppurativa of right axilla   2. Bronchitis     Discussed I & D of right axilla, pt defers.  Will try abx, continue warm compresses.  She was prescribed doxycycline, hydrocdone which will also help with cough,  Tessalon perles for daytime use, albuterol prn wheezing,  Although none on exam tonight.  Encouraged f/u with pcp or return here for any worsened sx.  Advised she may need I & D if sx worsen,  She understands plan.  The patient appears reasonably screened and/or stabilized for discharge and I doubt any other medical condition or other [facility] requiring further screening, evaluation, or treatment in the ED at this time prior to discharge.     Burgess Amor,  PA-C 10/14/12 0015

## 2012-10-13 NOTE — ED Notes (Signed)
Pt reporting abscess under right arm also reporting cough for past 2 days.  States that she was instructed by physician to come to ED.

## 2012-10-13 NOTE — ED Notes (Signed)
Vicodin handed to pt by EDPa to be taken at home. Pt driving.

## 2012-10-14 NOTE — ED Provider Notes (Signed)
Medical screening examination/treatment/procedure(s) were performed by non-physician practitioner and as supervising physician I was immediately available for consultation/collaboration.   Jameriah Trotti, MD 10/14/12 0150 

## 2012-11-11 NOTE — Telephone Encounter (Signed)
Routed to nurse.

## 2012-11-23 ENCOUNTER — Ambulatory Visit (INDEPENDENT_AMBULATORY_CARE_PROVIDER_SITE_OTHER): Payer: BC Managed Care – PPO | Admitting: Obstetrics and Gynecology

## 2012-11-23 ENCOUNTER — Encounter: Payer: Self-pay | Admitting: Obstetrics and Gynecology

## 2012-11-23 ENCOUNTER — Encounter (INDEPENDENT_AMBULATORY_CARE_PROVIDER_SITE_OTHER): Payer: Self-pay

## 2012-11-23 VITALS — BP 120/76 | Ht 64.0 in | Wt 222.0 lb

## 2012-11-23 DIAGNOSIS — N970 Female infertility associated with anovulation: Secondary | ICD-10-CM

## 2012-11-23 MED ORDER — CLOMIPHENE CITRATE 50 MG PO TABS: 100.0000 mg | ORAL_TABLET | Freq: Every day | ORAL | Status: DC

## 2012-11-23 NOTE — Progress Notes (Signed)
Patient ID: Kathleen Velasquez, female   DOB: 1990-05-01, 23 y.o.   MRN: [id number] Pt here today for follow up on clomid. Pt states that her period was 2 weeks late but that she started yesterday, and that she is having heavy bleeding.    [facility] Visit  Patient name: Kathleen Velasquez MRN [id number]  Date of birth: 1990/05/25  CC & HPI:  Kathleen Velasquez is a 22 y.o. female presenting today for infertility f/u. Took clomiphene 100 on day 3 of cycle, 28 Septwas day 1, with no ovulation kit use this month. Menses  Began menses on cycle day 44, but spotted x 8 d premenses.  Did not test Progesterone level this cycle Using App MyFertility[employer], and written log.  ROS:   Partner: Adam: has 3 children prior relationship Pertinent History Reviewed:  Medical & Surgical Hx:  Reviewed: Significant for normal TSH  In May Medications: Reviewed & Updated - see associated section Social History: Reviewed -  reports that she has been smoking Cigarettes.  She has a 2.5 pack-year smoking history. She has never used smokeless tobacco.  Objective Findings:  Vitals: BP 120/76  Ht 5\' 4"  (1.626 m)  Wt 222 lb (100.699 kg)  BMI 38.09 kg/m2  LMP 11/22/2012  Physical Examination:    Assessment & Plan:   Infertility , anovulation P: go back to clomid 100 x days 3-5     Check progesterone level day 21 (Dec1)

## 2012-12-12 ENCOUNTER — Other Ambulatory Visit: Payer: BC Managed Care – PPO

## 2012-12-12 DIAGNOSIS — IMO0002 Reserved for concepts with insufficient information to code with codable children: Secondary | ICD-10-CM

## 2012-12-13 LAB — PROGESTERONE: Progesterone: 25.4 ng/mL

## 2013-01-03 ENCOUNTER — Telehealth: Payer: Self-pay

## 2013-01-03 NOTE — Telephone Encounter (Signed)
Spoke with pt letting her know Progesterone was great. Pt had a period this month. Advised to continue Clomid and let us know when she misses a period or if she has any problems. Pt voiced understanding. JSY

## 2013-01-11 ENCOUNTER — Telehealth: Payer: Self-pay | Admitting: Obstetrics and Gynecology

## 2013-01-11 NOTE — Telephone Encounter (Signed)
Pt states have some brownish discharge not sure if this is her period but pt states was to start clomid when started her period per Dr. Emelda Fear. "Should pt start clomid or wait til full blown period?"

## 2013-01-12 NOTE — Telephone Encounter (Signed)
Pt had excellent progesterone level on 12.1.13 of 25.  Pt doesn't recall (!) if she had a cycle in December. Pt now spotting litely. PLAN: Pt to check UPT If negative, wait for full menses to start next cycle of Clomiphene. If POS, pt to come in for serum qhcg.

## 2013-01-16 ENCOUNTER — Telehealth: Payer: Self-pay | Admitting: *Deleted

## 2013-01-16 NOTE — Telephone Encounter (Signed)
Pt states had negative pregnancy test today, unsure of last date of cycle. Pt encouraged to wait another 1-2 weeks do pregnancy test, if not positive to call office back to discuss with Dr. Emelda FearFerguson. Pt verbalized understanding.

## 2013-01-18 ENCOUNTER — Emergency Department (HOSPITAL_COMMUNITY)
Admission: EM | Admit: 2013-01-18 | Discharge: 2013-01-18 | Disposition: A | Discharge status: Home / Self Care | Payer: BC Managed Care – PPO | Attending: Emergency Medicine | Admitting: Emergency Medicine

## 2013-01-18 ENCOUNTER — Encounter (HOSPITAL_COMMUNITY): Payer: Self-pay | Admitting: Emergency Medicine

## 2013-01-18 ENCOUNTER — Emergency Department (HOSPITAL_COMMUNITY): Payer: BC Managed Care – PPO

## 2013-01-18 DIAGNOSIS — Y939 Activity, unspecified: Secondary | ICD-10-CM | POA: Insufficient documentation

## 2013-01-18 DIAGNOSIS — Z3202 Encounter for pregnancy test, result negative: Secondary | ICD-10-CM | POA: Insufficient documentation

## 2013-01-18 DIAGNOSIS — Z880 Allergy status to penicillin: Secondary | ICD-10-CM | POA: Insufficient documentation

## 2013-01-18 DIAGNOSIS — W010XXA Fall on same level from slipping, tripping and stumbling without subsequent striking against object, initial encounter: Secondary | ICD-10-CM | POA: Insufficient documentation

## 2013-01-18 DIAGNOSIS — Z9089 Acquired absence of other organs: Secondary | ICD-10-CM | POA: Insufficient documentation

## 2013-01-18 DIAGNOSIS — Z8742 Personal history of other diseases of the female genital tract: Secondary | ICD-10-CM | POA: Insufficient documentation

## 2013-01-18 DIAGNOSIS — Y929 Unspecified place or not applicable: Secondary | ICD-10-CM | POA: Insufficient documentation

## 2013-01-18 DIAGNOSIS — F17200 Nicotine dependence, unspecified, uncomplicated: Secondary | ICD-10-CM | POA: Insufficient documentation

## 2013-01-18 DIAGNOSIS — S63501A Unspecified sprain of right wrist, initial encounter: Secondary | ICD-10-CM

## 2013-01-18 DIAGNOSIS — Z872 Personal history of diseases of the skin and subcutaneous tissue: Secondary | ICD-10-CM | POA: Insufficient documentation

## 2013-01-18 DIAGNOSIS — S63509A Unspecified sprain of unspecified wrist, initial encounter: Secondary | ICD-10-CM | POA: Insufficient documentation

## 2013-01-18 DIAGNOSIS — Z79899 Other long term (current) drug therapy: Secondary | ICD-10-CM | POA: Insufficient documentation

## 2013-01-18 LAB — PREGNANCY, URINE: Preg Test, Ur: NEGATIVE

## 2013-01-18 MED ORDER — IBUPROFEN 400 MG PO TABS
600.0000 mg | ORAL_TABLET | Freq: Once | ORAL | Status: AC
  Administered 2013-01-18: 600 mg via ORAL
  Filled 2013-01-18: qty 2

## 2013-01-18 MED ORDER — IBUPROFEN 600 MG PO TABS: 600.0000 mg | ORAL_TABLET | Freq: Four times a day (QID) | ORAL | Status: DC | PRN

## 2013-01-18 NOTE — ED Notes (Signed)
Pt alert & oriented x4, stable gait. Patient given discharge instructions, paperwork & prescription(s). Patient  instructed to stop at the registration desk to finish any additional paperwork. Patient verbalized understanding. Pt left department w/ no further questions. 

## 2013-01-18 NOTE — Discharge Instructions (Signed)
Followup with the orthopedist in one week if you continue to have pain in her wrist. Return immediately to the emergency department for worsening pain, increased swelling, numbness, weakness or any concerns. Keep wrist elevated and may use ice for swelling. Take medication as prescribed.  Wrist Pain Wrist injuries are frequent in adults and children. A sprain is an injury to the ligaments that hold your bones together. A strain is an injury to muscle or muscle cord-like structures (tendons) from stretching or pulling. Generally, when wrists are moderately tender to touch following a fall or injury, a break in the bone (fracture) may be present. Most wrist sprains or strains are better in 3 to 5 days, but complete healing may take several weeks. HOME CARE INSTRUCTIONS   Put ice on the injured area.  Put ice in a plastic bag.  Place a towel between your skin and the bag.  Leave the ice on for 15-20 minutes, 03-04 times a day, for the first 2 days.  Keep your arm raised above the level of your heart whenever possible to reduce swelling and pain.  Rest the injured area for at least 48 hours or as directed by your caregiver.  If a splint or elastic bandage has been applied, use it for as long as directed by your caregiver or until seen by a caregiver for a follow-up exam.  Only take over-the-counter or prescription medicines for pain, discomfort, or fever as directed by your caregiver.  Keep all follow-up appointments. You may need to follow up with a specialist or have follow-up X-rays. Improvement in pain level is not a guarantee that you did not fracture a bone in your wrist. The only way to determine whether or not you have a broken bone is by X-ray. SEEK IMMEDIATE MEDICAL CARE IF:   Your fingers are swollen, very red, white, or cold and blue.  Your fingers are numb or tingling.  You have increasing pain.  You have difficulty moving your fingers. MAKE SURE YOU:   Understand these  instructions.  Will watch your condition.  Will get help right away if you are not doing well or get worse. Document Released: 10/08/2004 Document Revised: 03/23/2011 Document Reviewed: 02/19/2010 [facility] Patient Information 2014 [place], [place].

## 2013-01-18 NOTE — ED Provider Notes (Signed)
CSN: [id number]     Arrival date & time 01/18/13  0051 History   First MD Initiated Contact with Patient 01/18/13 0500     Chief Complaint  Patient presents with  . Wrist Pain   (Consider location/radiation/quality/duration/timing/severity/associated sxs/prior Treatment) HPI Patient presents with right wrist pain after slip and fall onto outstretched right hand. This happened roughly 24 hours prior to presentation. She has no swelling or deformity of the wrist. She has no numbness or tingling in the hand. She has no weakness. She denies any other injury. Past Medical History  Diagnosis Date  . Abscess     right buttocks/thigh  . DUB (dysfunctional uterine bleeding) 05/25/2012   Past Surgical History  Procedure Laterality Date  . Hernia repair    . Tonsillectomy    . Adenoidectomy    . Hydradenitis excision  10/02/2011    Procedure: EXCISION HYDRADENITIS AXILLA;  Surgeon: Fabio BeringBrent C Ziegler, MD;  Location: [facility];  Service: General;  Laterality: Right;  Right Axillary Dissection  . Cholecystectomy  01/27/2012    Procedure: LAPAROSCOPIC CHOLECYSTECTOMY;  Surgeon: Fabio BeringBrent C Ziegler, MD;  Location: [facility];  Service: General;  Laterality: N/A;   Family History  Problem Relation Age of Onset  . Diabetes Mother   . Hypertension Mother   . Hyperlipidemia Mother   . Diabetes Maternal Grandmother   . Hypertension Maternal Grandmother   . Heart disease Maternal Grandmother     CHF  . COPD Maternal Grandmother   . Cirrhosis Maternal Grandmother   . Cancer Maternal Grandfather     lung  . Diabetes Paternal Grandmother   . Diabetes Paternal Grandfather   . Aneurysm Paternal Grandfather   . Stroke Other    History  Substance Use Topics  . Smoking status: Current Every Day Smoker -- 0.50 packs/day for 5 years    Types: Cigarettes  . Smokeless tobacco: Never Used  . Alcohol Use: No     Comment: social use   OB History   Grav Para Term Preterm Abortions TAB SAB Ect Mult Living   0               Review of Systems  Musculoskeletal: Positive for arthralgias. Negative for myalgias, neck pain and neck stiffness.  Neurological: Negative for weakness and numbness.  All other systems reviewed and are negative.    Allergies  Bactrim; Ciprofloxacin; Other; Penicillins; and Percocet  Home Medications   Current Outpatient Rx  Name  Route  Sig  Dispense  Refill  . acetaminophen (TYLENOL) 325 MG tablet   Oral   Take 650 mg by mouth every 6 (six) hours as needed for pain.         Marland Kitchen. albuterol (PROVENTIL HFA;VENTOLIN HFA) 108 (90 BASE) MCG/ACT inhaler   Inhalation   Inhale 1-2 puffs into the lungs every 6 (six) hours as needed for wheezing.   1 Inhaler   0   . clomiPHENE (CLOMID) 50 MG tablet   Oral   Take 2 tablets (100 mg total) by mouth daily. On days 3-7 of cycle   10 tablet   3   . ibuprofen (ADVIL,MOTRIN) 600 MG tablet   Oral   Take 1 tablet (600 mg total) by mouth every 6 (six) hours as needed.   30 tablet   0    BP 114/74  Pulse 67  Temp(Src) 97.8 F (36.6 C) (Oral)  Resp 16  Ht 5\' 4"  (1.626 m)  Wt 222 lb 3.2 oz (100.789 kg)  BMI 38.12 kg/m2  SpO2 99%  LMP 12/09/2012 Physical Exam  Nursing note and vitals reviewed. Constitutional: She is oriented to person, place, and time. She appears well-developed and well-nourished. No distress.  HENT:  Head: Normocephalic and atraumatic.  Mouth/Throat: Oropharynx is clear and moist.  Eyes: EOM are normal. Pupils are equal, round, and reactive to light.  Neck: Normal range of motion. Neck supple.  No posterior cervical tenderness.  Cardiovascular: Normal rate and regular rhythm.   Pulmonary/Chest: Effort normal.  Abdominal: Soft. Bowel sounds are normal.  Musculoskeletal: Normal range of motion. She exhibits tenderness. She exhibits no edema.  Tenderness to palpation of the distal radius on the right. There is no deformity or swelling. Patient has no snuffbox tenderness. She has 2+ radial pulses and good  distal cap refill. She has full range of motion of the wrist and elbow.  Neurological: She is alert and oriented to person, place, and time.  Skin: Skin is warm and dry. No rash noted. No erythema.  Psychiatric: She has a normal mood and affect. Her behavior is normal.    ED Course  Procedures (including critical care time) Labs Review Labs Reviewed  PREGNANCY, URINE   Imaging Review Dg Wrist Complete Right  01/18/2013   CLINICAL DATA:  Fall with thumb based pain  EXAM: RIGHT WRIST - COMPLETE 3+ VIEW  COMPARISON:  05/13/2012  FINDINGS: There is no evidence of fracture or dislocation. There is no evidence of arthropathy or other focal bone abnormality. Soft tissues are unremarkable.  IMPRESSION: Negative.   Electronically Signed   By: Tiburcio Pea M.D.   On: 01/18/2013 05:04    EKG Interpretation   None       MDM   1. Wrist sprain, right, initial encounter    X-rays negative for fracture. Placed in splint. Likely has restrained. Followup with orthopedic for persistent pain. Return precautions given    Loren Racer, MD 01/18/13 [phone]

## 2013-01-18 NOTE — ED Notes (Signed)
Pt reporting falling and right wrist.  At present, wrist wrapped.

## 2013-01-23 ENCOUNTER — Telehealth: Payer: Self-pay | Admitting: Gastroenterology

## 2013-01-23 ENCOUNTER — Telehealth: Payer: Self-pay | Admitting: *Deleted

## 2013-01-23 ENCOUNTER — Ambulatory Visit: Payer: BC Managed Care – PPO | Admitting: Gastroenterology

## 2013-01-23 NOTE — Telephone Encounter (Signed)
Pt advised that last progesterone level 25, very good, indication of ovulation, so no reason to repeat clomiphene until the pt has a normal menses. Pt to repeat qhcg in 1 wk and let us know results.

## 2013-01-23 NOTE — Telephone Encounter (Signed)
Pt was a no show

## 2013-02-02 ENCOUNTER — Telehealth: Payer: Self-pay | Admitting: Obstetrics and Gynecology

## 2013-02-02 NOTE — Telephone Encounter (Signed)
Spoke with pt. Pt advised to continue current clomid dose and to make an appointment to see Dr. Emelda FearFerguson.

## 2013-02-02 NOTE — Telephone Encounter (Signed)
See prior note

## 2013-02-02 NOTE — Telephone Encounter (Signed)
Progesterone levels are optimal on current clomid dose.   Please advise pt to continue current doses, and  Make appt for discussion of other factors that may be tested, it may be time to do HSG, or review female factor .

## 2013-02-02 NOTE — Telephone Encounter (Signed)
Pt states that she wanted to know if she should up the dose of clomid.  Pt states that she has been on the same dose since July of last year. Does the pt need to come and see you?

## 2013-02-03 ENCOUNTER — Ambulatory Visit (INDEPENDENT_AMBULATORY_CARE_PROVIDER_SITE_OTHER): Payer: BC Managed Care – PPO | Admitting: Obstetrics and Gynecology

## 2013-02-03 ENCOUNTER — Encounter: Payer: Self-pay | Admitting: Obstetrics and Gynecology

## 2013-02-03 VITALS — BP 120/74 | Ht 64.0 in | Wt 224.0 lb

## 2013-02-03 DIAGNOSIS — N970 Female infertility associated with anovulation: Secondary | ICD-10-CM

## 2013-02-03 MED ORDER — CLOMIPHENE CITRATE 50 MG PO TABS: 100.0000 mg | ORAL_TABLET | Freq: Every day | ORAL | Status: DC

## 2013-02-03 NOTE — Progress Notes (Signed)
This chart was transcribed for Dr. Mallory Shirk by Ludger Nutting, ED scribe.    [facility] Visit  Patient name: Kathleen Velasquez MRN [id number]  Date of birth: [birth year]  CC & HPI:  Kathleen Velasquez is a 23 y.o. female presenting today for follow up. She is here to discuss fertility complications. She had a high progesterone level indicating ovulation, done on 12/12/12. Last menses started on 01/31/13.   Husband has not had any testing so far since he has fathered 4 other children.   ROS:  Negative except for unusual length of time from good progesterone level Dec 1st, ovulatory, then 7 WEEKS til pt had withdrawal menses, but preg tests remained negative.  Pertinent History Reviewed:  Medical & Surgical Hx:  Reviewed: Significant for   Past Medical History  Diagnosis Date  . Abscess     right buttocks/thigh  . DUB (dysfunctional uterine bleeding) 05/25/2012   Left inguinal hernia repair   Past Surgical History  Procedure Laterality Date  . Hernia repair    . Tonsillectomy    . Adenoidectomy    . Hydradenitis excision  10/02/2011    Procedure: EXCISION HYDRADENITIS AXILLA;  Surgeon: Donato Heinz, MD;  Location: [facility];  Service: General;  Laterality: Right;  Right Axillary Dissection  . Cholecystectomy  01/27/2012    Procedure: LAPAROSCOPIC CHOLECYSTECTOMY;  Surgeon: Donato Heinz, MD;  Location: [facility];  Service: General;  Laterality: N/A;     Medications: Reviewed & Updated - see associated section Social History: Reviewed -  reports that she has been smoking Cigarettes.  She has a 2.5 pack-year smoking history. She has never used smokeless tobacco.  Objective Findings  Vitals: BP 120/74  Ht 5' 4"  (1.626 m)  Wt 224 lb (101.606 kg)  BMI 38.43 kg/m2  LMP 01/31/2013  Physical Examination: General appearance - alert, well appearing, and in no distress and oriented to person, place, and time PELVIC exam not needed for this visit.    Assessment & Plan:  A:  infertility , anovulation, responding to clomid 100 daily x 5 d      Untested female with prior 4 pregnancies      Plan: 1.Continue taking 100 mg of Clomid and 2. using urine ovulation predictor kit.  3.Check progesterone on day 21 of every month.  4.Postpone HSG til summer

## 2013-02-03 NOTE — Patient Instructions (Signed)
As discussed

## 2013-02-21 ENCOUNTER — Other Ambulatory Visit: Payer: BC Managed Care – PPO

## 2013-02-21 DIAGNOSIS — N970 Female infertility associated with anovulation: Secondary | ICD-10-CM

## 2013-02-21 DIAGNOSIS — IMO0002 Reserved for concepts with insufficient information to code with codable children: Secondary | ICD-10-CM

## 2013-02-22 LAB — PROGESTERONE: Progesterone: 24.5 ng/mL

## 2013-02-24 ENCOUNTER — Telehealth: Payer: Self-pay | Admitting: *Deleted

## 2013-02-24 NOTE — Telephone Encounter (Signed)
Message copied by Criss AlvinePULLIAM, CHRYSTAL G on Fri Feb 24, 2013  8:31 AM ------      Message from: Tilda BurrowFERGUSON, JOHN V      Created: Thu Feb 23, 2013  6:36 PM       Good ovulatory levels, pt appears to be ovulating regularly ------

## 2013-02-24 NOTE — Telephone Encounter (Signed)
Message copied by Criss AlvinePULLIAM, CHRYSTAL G on Fri Feb 24, 2013  8:33 AM ------      Message from: Tilda BurrowFERGUSON, JOHN V      Created: Thu Feb 23, 2013  6:36 PM       Good ovulatory levels, pt appears to be ovulating regularly ------

## 2013-02-24 NOTE — Telephone Encounter (Signed)
Pt informed Progesterone level shows ovulation per Dr. Emelda FearFerguson.

## 2013-02-24 NOTE — Telephone Encounter (Signed)
Message copied by Criss AlvinePULLIAM, CHRYSTAL G on Fri Feb 24, 2013  8:30 AM ------      Message from: Tilda BurrowFERGUSON, JOHN V      Created: Thu Feb 23, 2013  6:36 PM       Good ovulatory levels, pt appears to be ovulating regularly ------

## 2013-03-13 ENCOUNTER — Telehealth: Payer: Self-pay | Admitting: Adult Health

## 2013-03-13 NOTE — Telephone Encounter (Signed)
Pt has several questions concerning infertility, an appt was made for 03/21/2013 with Dr. Emelda FearFerguson.

## 2013-03-16 ENCOUNTER — Other Ambulatory Visit (HOSPITAL_COMMUNITY): Payer: Self-pay | Admitting: Family Medicine

## 2013-03-16 ENCOUNTER — Ambulatory Visit (HOSPITAL_COMMUNITY)
Admission: RE | Admit: 2013-03-16 | Discharge: 2013-03-16 | Disposition: A | Discharge status: Home / Self Care | Payer: BC Managed Care – PPO | Source: Ambulatory Visit | Attending: Family Medicine | Admitting: Family Medicine

## 2013-03-16 DIAGNOSIS — M545 Low back pain, unspecified: Secondary | ICD-10-CM

## 2013-03-20 ENCOUNTER — Telehealth: Payer: Self-pay

## 2013-03-20 ENCOUNTER — Other Ambulatory Visit: Payer: BC Managed Care – PPO

## 2013-03-20 NOTE — Telephone Encounter (Signed)
Done

## 2013-03-21 ENCOUNTER — Other Ambulatory Visit: Payer: BC Managed Care – PPO

## 2013-03-21 ENCOUNTER — Ambulatory Visit: Payer: BC Managed Care – PPO | Admitting: Obstetrics and Gynecology

## 2013-03-22 ENCOUNTER — Other Ambulatory Visit (HOSPITAL_COMMUNITY): Payer: Self-pay | Admitting: Family Medicine

## 2013-03-22 ENCOUNTER — Other Ambulatory Visit: Payer: BC Managed Care – PPO

## 2013-03-22 ENCOUNTER — Telehealth: Payer: Self-pay | Admitting: *Deleted

## 2013-03-22 ENCOUNTER — Encounter (INDEPENDENT_AMBULATORY_CARE_PROVIDER_SITE_OTHER): Payer: Self-pay

## 2013-03-22 DIAGNOSIS — N970 Female infertility associated with anovulation: Secondary | ICD-10-CM

## 2013-03-22 DIAGNOSIS — M549 Dorsalgia, unspecified: Secondary | ICD-10-CM

## 2013-03-22 DIAGNOSIS — IMO0002 Reserved for concepts with insufficient information to code with codable children: Secondary | ICD-10-CM

## 2013-03-22 NOTE — Telephone Encounter (Signed)
Pt was in the office this morning for lab work and she is wanting to see if she can have the HSG sooner than this summer.  At her last visit with you on 02/03/13 it was discussed and in your notes it says to postpone the test until summer but patient states she is getting discouraged with the Clomid not working and wants to have it done as soon as possible.  Please advise.

## 2013-03-23 LAB — PROGESTERONE: PROGESTERONE: 0.6 ng/mL

## 2013-03-26 NOTE — Telephone Encounter (Signed)
Pt informed that progesterone test is indication of anovulation. Pt should expect a delayed menses. PT desires to have HSG performed: she is to notify us of onset of next menses, and efforts to schedule HSG shortly after menses will be done. ALSO, husband, who has 3 children from prior relationship, will need semen analysis.

## 2013-03-27 ENCOUNTER — Ambulatory Visit (HOSPITAL_COMMUNITY)
Admission: RE | Admit: 2013-03-27 | Discharge: 2013-03-27 | Disposition: A | Discharge status: Home / Self Care | Payer: BC Managed Care – PPO | Source: Ambulatory Visit | Attending: Family Medicine | Admitting: Family Medicine

## 2013-03-27 DIAGNOSIS — M79609 Pain in unspecified limb: Secondary | ICD-10-CM | POA: Insufficient documentation

## 2013-03-27 DIAGNOSIS — M545 Low back pain, unspecified: Secondary | ICD-10-CM | POA: Insufficient documentation

## 2013-03-27 DIAGNOSIS — IMO0002 Reserved for concepts with insufficient information to code with codable children: Secondary | ICD-10-CM | POA: Insufficient documentation

## 2013-03-27 DIAGNOSIS — M549 Dorsalgia, unspecified: Secondary | ICD-10-CM

## 2013-03-30 ENCOUNTER — Telehealth: Payer: Self-pay | Admitting: *Deleted

## 2013-03-30 ENCOUNTER — Telehealth: Payer: Self-pay

## 2013-03-30 NOTE — Telephone Encounter (Signed)
Pt states started her period today, was to let Dr. Emelda FearFerguson due to infertility.

## 2013-04-04 ENCOUNTER — Telehealth: Payer: Self-pay | Admitting: *Deleted

## 2013-04-04 NOTE — Telephone Encounter (Signed)
Pt calling back states tomorrow is day  7 of her period, was to call Dr. Caswell CorwinFeruson to let him know to start med for infertility.

## 2013-04-05 ENCOUNTER — Telehealth: Payer: Self-pay | Admitting: *Deleted

## 2013-04-05 ENCOUNTER — Other Ambulatory Visit: Payer: Self-pay | Admitting: Obstetrics and Gynecology

## 2013-04-05 DIAGNOSIS — IMO0002 Reserved for concepts with insufficient information to code with codable children: Secondary | ICD-10-CM

## 2013-04-05 NOTE — Telephone Encounter (Signed)
Dr. Emelda FearFerguson made an appointment for the pt to have HSG at [facility] on Friday at 2:00. Pt was advised of the appointment.

## 2013-04-06 NOTE — Telephone Encounter (Signed)
Hsg scheduled for 3/27

## 2013-04-07 ENCOUNTER — Other Ambulatory Visit: Payer: Self-pay | Admitting: Obstetrics and Gynecology

## 2013-04-07 ENCOUNTER — Ambulatory Visit (HOSPITAL_COMMUNITY)
Admission: RE | Admit: 2013-04-07 | Discharge: 2013-04-07 | Disposition: A | Discharge status: Home / Self Care | Payer: BC Managed Care – PPO | Source: Ambulatory Visit | Attending: Obstetrics and Gynecology | Admitting: Obstetrics and Gynecology

## 2013-04-07 DIAGNOSIS — IMO0002 Reserved for concepts with insufficient information to code with codable children: Secondary | ICD-10-CM

## 2013-04-07 DIAGNOSIS — N970 Female infertility associated with anovulation: Secondary | ICD-10-CM

## 2013-04-07 DIAGNOSIS — N979 Female infertility, unspecified: Secondary | ICD-10-CM | POA: Insufficient documentation

## 2013-04-07 LAB — POCT PREGNANCY, URINE: Preg Test, Ur: NEGATIVE

## 2013-04-07 MED ORDER — DOXYCYCLINE HYCLATE 100 MG PO CAPS: 100.0000 mg | ORAL_CAPSULE | Freq: Two times a day (BID) | ORAL | Status: DC

## 2013-04-07 MED ORDER — IOHEXOL 300 MG/ML  SOLN
50.0000 mL | Freq: Once | INTRAMUSCULAR | Status: AC | PRN
  Administered 2013-04-07: 6 mL

## 2013-04-07 MED ORDER — POVIDONE-IODINE 10 % EX SOLN
CUTANEOUS | Status: AC
  Administered 2013-04-07: 15:00:00
  Filled 2013-04-07: qty 15

## 2013-04-07 NOTE — Progress Notes (Signed)
Hysterogram: After preg test negative, pt placed on table, consent confirmed, and Cervix prepped with betadine, and acorn placed in cervix. 6 cc contrast injected, with prompt spill from both tubes. Uterus contours smooth.  IMP: normal HSG followup as outpatient.

## 2013-04-07 NOTE — Telephone Encounter (Signed)
Hsg done at [facility], see xray note. Bilateral spill confirmed.

## 2013-04-19 ENCOUNTER — Other Ambulatory Visit: Payer: BC Managed Care – PPO

## 2013-04-19 DIAGNOSIS — IMO0002 Reserved for concepts with insufficient information to code with codable children: Secondary | ICD-10-CM

## 2013-04-20 LAB — PROGESTERONE: PROGESTERONE: 4.1 ng/mL

## 2013-04-25 ENCOUNTER — Telehealth: Payer: Self-pay | Admitting: Obstetrics and Gynecology

## 2013-04-25 NOTE — Telephone Encounter (Signed)
Pt states got Dr. Emelda FearFerguson message this am in regards to low progesterone level.  Pt states last month she "did not ovulate at all" was unsure if Dr. Emelda FearFerguson was aware of this. Pt states she does not have a f/u did not know if she needed one or not. Please advise.

## 2013-04-25 NOTE — Telephone Encounter (Signed)
Left message, pt progest levels low but ovulatory. Keep appt as scheduled

## 2013-04-27 NOTE — Telephone Encounter (Signed)
Done

## 2013-04-30 ENCOUNTER — Emergency Department (HOSPITAL_COMMUNITY): Payer: BC Managed Care – PPO

## 2013-04-30 ENCOUNTER — Encounter (HOSPITAL_COMMUNITY): Payer: Self-pay | Admitting: Emergency Medicine

## 2013-04-30 ENCOUNTER — Emergency Department (HOSPITAL_COMMUNITY)
Admission: EM | Admit: 2013-04-30 | Discharge: 2013-04-30 | Disposition: A | Discharge status: Home / Self Care | Payer: BC Managed Care – PPO | Attending: Emergency Medicine | Admitting: Emergency Medicine

## 2013-04-30 DIAGNOSIS — Y9389 Activity, other specified: Secondary | ICD-10-CM | POA: Insufficient documentation

## 2013-04-30 DIAGNOSIS — S8990XA Unspecified injury of unspecified lower leg, initial encounter: Secondary | ICD-10-CM | POA: Insufficient documentation

## 2013-04-30 DIAGNOSIS — Z880 Allergy status to penicillin: Secondary | ICD-10-CM | POA: Insufficient documentation

## 2013-04-30 DIAGNOSIS — Z79899 Other long term (current) drug therapy: Secondary | ICD-10-CM | POA: Insufficient documentation

## 2013-04-30 DIAGNOSIS — T300 Burn of unspecified body region, unspecified degree: Secondary | ICD-10-CM

## 2013-04-30 DIAGNOSIS — Y9289 Other specified places as the place of occurrence of the external cause: Secondary | ICD-10-CM | POA: Insufficient documentation

## 2013-04-30 DIAGNOSIS — F17200 Nicotine dependence, unspecified, uncomplicated: Secondary | ICD-10-CM | POA: Insufficient documentation

## 2013-04-30 DIAGNOSIS — Z872 Personal history of diseases of the skin and subcutaneous tissue: Secondary | ICD-10-CM | POA: Insufficient documentation

## 2013-04-30 DIAGNOSIS — S99919A Unspecified injury of unspecified ankle, initial encounter: Secondary | ICD-10-CM | POA: Insufficient documentation

## 2013-04-30 DIAGNOSIS — S99929A Unspecified injury of unspecified foot, initial encounter: Principal | ICD-10-CM

## 2013-04-30 DIAGNOSIS — W1809XA Striking against other object with subsequent fall, initial encounter: Secondary | ICD-10-CM | POA: Insufficient documentation

## 2013-04-30 DIAGNOSIS — IMO0002 Reserved for concepts with insufficient information to code with codable children: Secondary | ICD-10-CM | POA: Insufficient documentation

## 2013-04-30 DIAGNOSIS — M7918 Myalgia, other site: Secondary | ICD-10-CM

## 2013-04-30 DIAGNOSIS — W010XXA Fall on same level from slipping, tripping and stumbling without subsequent striking against object, initial encounter: Secondary | ICD-10-CM | POA: Insufficient documentation

## 2013-04-30 DIAGNOSIS — Z792 Long term (current) use of antibiotics: Secondary | ICD-10-CM | POA: Insufficient documentation

## 2013-04-30 DIAGNOSIS — Z8742 Personal history of other diseases of the female genital tract: Secondary | ICD-10-CM | POA: Insufficient documentation

## 2013-04-30 MED ORDER — HYDROCODONE-ACETAMINOPHEN 5-325 MG PO TABS
1.0000 | ORAL_TABLET | Freq: Once | ORAL | Status: AC
  Administered 2013-04-30: 1 via ORAL
  Filled 2013-04-30: qty 1

## 2013-04-30 MED ORDER — HYDROCODONE-ACETAMINOPHEN 5-325 MG PO TABS: 1.0000 | ORAL_TABLET | Freq: Four times a day (QID) | ORAL | Status: DC | PRN

## 2013-04-30 MED ORDER — BACITRACIN ZINC 500 UNIT/GM EX OINT
TOPICAL_OINTMENT | Freq: Two times a day (BID) | CUTANEOUS | Status: DC
  Administered 2013-04-30: 1 via TOPICAL
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
## 2013-04-30 NOTE — Discharge Instructions (Signed)
Knee Pain Knee pain can be a result of an injury or other medical conditions. Treatment will depend on the cause of your pain. HOME CARE  Only take medicine as told by your doctor.  Keep a healthy weight. Being overweight can make the knee hurt more.  Stretch before exercising or playing sports.  If there is constant knee pain, change the way you exercise. Ask your doctor for advice.  Make sure shoes fit well. Choose the right shoe for the sport or activity.  Protect your knees. Wear kneepads if needed.  Rest when you are tired. GET HELP RIGHT AWAY IF:   Your knee pain does not stop.  Your knee pain does not get better.  Your knee joint feels hot to the touch.  You have a fever. MAKE SURE YOU:   Understand these instructions.  Will watch this condition.  Will get help right away if you are not doing well or get worse. Document Released: 03/27/2008 Document Revised: 03/23/2011 Document Reviewed: 03/27/2008 [facility] Patient Information 2014 [place], [place]. Burn Care Your skin is a natural barrier to infection. It is the largest organ of your body. Burns damage this natural protection. To help prevent infection, it is very important to follow your caregiver's instructions in the care of your burn. Burns are classified as:  First degree. There is only redness of the skin (erythema). No scarring is expected.  Second degree. There is blistering of the skin. Scarring may occur with deeper burns.  Third degree. All layers of the skin are injured, and scarring is expected. HOME CARE INSTRUCTIONS   Wash your hands well before changing your bandage.  Change your bandage as often as directed by your caregiver.  Remove the old bandage. If the bandage sticks, you may soak it off with cool, clean water.  Cleanse the burn thoroughly but gently with mild soap and water.  Pat the area dry with a clean, dry cloth.  Apply a thin layer of antibacterial cream to the burn.  Apply  a clean bandage as instructed by your caregiver.  Keep the bandage as clean and dry as possible.  Elevate the affected area for the first 24 hours, then as instructed by your caregiver.  Only take over-the-counter or prescription medicines for pain, discomfort, or fever as directed by your caregiver. SEEK IMMEDIATE MEDICAL CARE IF:   You develop excessive pain.  You develop redness, tenderness, swelling, or red streaks near the burn.  The burned area develops yellowish-white fluid (pus) or a bad smell.  You have a fever. MAKE SURE YOU:   Understand these instructions.  Will watch your condition.  Will get help right away if you are not doing well or get worse. Document Released: 12/29/2004 Document Revised: 03/23/2011 Document Reviewed: 05/21/2010 [facility] Patient Information 2014 [place]ExitCare, [place]LL[place].
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
## 2013-04-30 NOTE — ED Provider Notes (Signed)
CSN: [id number]     Arrival date & time 04/30/13  1822 History   First MD Initiated Contact with Patient 04/30/13 1907     Chief Complaint  Patient presents with  . Fall  . Hand Burn  . Knee Pain    Left knee     (Consider location/radiation/quality/duration/timing/severity/associated sxs/prior Treatment) HPI  This is a 23 y.o. female with PMH of abscess, DUB, presenting with pain associated with burns.  Onset prior to arrival. Located distal phalanges of long and ring fingers of the right hand.  Persistent. "Burning" in character. No meds taken. Aggravated with palpation and movement. Negative for radiation. Negative for weakness, numbness, tingling.  Of note, this patient was entering this hospital to visit a family member in the ICU, she suffered a mechanical fall. Supposedly tripped on threshold. Landed on left knee. Now has left knee pain.  Throbbing. Located on jointline. Negative for weakness, numbness, tingling of the left lower extremity.  Past Medical History  Diagnosis Date  . Abscess     right buttocks/thigh  . DUB (dysfunctional uterine bleeding) 05/25/2012   Past Surgical History  Procedure Laterality Date  . Hernia repair    . Tonsillectomy    . Adenoidectomy    . Hydradenitis excision  10/02/2011    Procedure: EXCISION HYDRADENITIS AXILLA;  Surgeon: Fabio BeringBrent C Ziegler, MD;  Location: [facility];  Service: General;  Laterality: Right;  Right Axillary Dissection  . Cholecystectomy  01/27/2012    Procedure: LAPAROSCOPIC CHOLECYSTECTOMY;  Surgeon: Fabio BeringBrent C Ziegler, MD;  Location: [facility];  Service: General;  Laterality: N/A;   Family History  Problem Relation Age of Onset  . Diabetes Mother   . Hypertension Mother   . Hyperlipidemia Mother   . Diabetes Maternal Grandmother   . Hypertension Maternal Grandmother   . Heart disease Maternal Grandmother     CHF  . COPD Maternal Grandmother   . Cirrhosis Maternal Grandmother   . Cancer Maternal Grandfather     lung  .  Diabetes Paternal Grandmother   . Diabetes Paternal Grandfather   . Aneurysm Paternal Grandfather   . Stroke Other    History  Substance Use Topics  . Smoking status: Current Every Day Smoker -- 0.50 packs/day for 5 years    Types: Cigarettes  . Smokeless tobacco: Never Used  . Alcohol Use: Yes     Comment: social use   OB History   Grav Para Term Preterm Abortions TAB SAB Ect Mult Living   0              Review of Systems  Constitutional: Negative for fever and chills.  HENT: Negative for facial swelling.   Eyes: Negative for photophobia and pain.  Respiratory: Negative for cough and shortness of breath.   Cardiovascular: Negative for chest pain and leg swelling.  Gastrointestinal: Negative for nausea, vomiting and abdominal pain.  Genitourinary: Negative for dysuria.  Musculoskeletal: Positive for arthralgias.  Skin: Positive for wound. Negative for rash.  Neurological: Negative for seizures.  Hematological: Negative for adenopathy.      Allergies  Bactrim; Ciprofloxacin; Other; Penicillins; and Percocet  Home Medications   Prior to Admission medications   Medication Sig Start Date End Date Taking? Authorizing Provider  acetaminophen (TYLENOL) 325 MG tablet Take 650 mg by mouth every 6 (six) hours as needed for pain.    Historical Provider, MD  albuterol (PROVENTIL HFA;VENTOLIN HFA) 108 (90 BASE) MCG/ACT inhaler Inhale 1-2 puffs into the lungs every 6 (six)  hours as needed for wheezing. 10/13/12   Burgess AmorJulie Idol, PA-C  clomiPHENE (CLOMID) 50 MG tablet Take 2 tablets (100 mg total) by mouth daily. On days 3-7 of cycle 02/03/13   Tilda BurrowJohn V Ferguson, MD  doxycycline (VIBRAMYCIN) 100 MG capsule Take 1 capsule (100 mg total) by mouth 2 (two) times daily. 04/07/13   Tilda BurrowJohn V Ferguson, MD   BP 108/46  Pulse 77  Temp(Src) 98.5 F (36.9 C) (Oral)  Resp 18  Ht 5\' 4"  (1.626 m)  Wt 229 lb 2 oz (103.93 kg)  BMI 39.31 kg/m2  SpO2 99%  LMP 04/30/2013 Physical Exam  Constitutional: She  is oriented to person, place, and time. She appears well-developed and well-nourished. No distress.  HENT:  Head: Normocephalic and atraumatic.  Mouth/Throat: No oropharyngeal exudate.  Eyes: Conjunctivae are normal. Pupils are equal, round, and reactive to light. No scleral icterus.  Neck: Normal range of motion. No tracheal deviation present. No thyromegaly present.  Cardiovascular: Normal rate, regular rhythm and normal heart sounds.  Exam reveals no gallop and no friction rub.   No murmur heard. Pulmonary/Chest: Effort normal and breath sounds normal. No stridor. No respiratory distress. She has no wheezes. She has no rales. She exhibits no tenderness.  Abdominal: Soft. She exhibits no distension and no mass. There is no tenderness. There is no rebound and no guarding.  Musculoskeletal: Normal range of motion. She exhibits tenderness (L knee.  LLE has normal motor, sensory function. Positive pulses.). She exhibits no edema.  Neurological: She is alert and oriented to person, place, and time.  Skin: Skin is warm and dry. She is not diaphoretic.  Superficial burns noted to the long and ring fingers of the right hand, volar aspect. Negative for circumferential burns. Negative for neurovascular compromise. Negative for signs of infection or bullae at this time.  All compartments are soft.    ED Course  Procedures (including critical care time)  Imaging Review Dg Knee Complete 4 Views Left  04/30/2013   CLINICAL DATA:  Injury  EXAM: LEFT KNEE - COMPLETE 4+ VIEW  COMPARISON:  None.  FINDINGS: No acute fracture.  No dislocation.  IMPRESSION: No acute bony pathology.   Electronically Signed   By: Maryclare BeanArt  Hoss M.D.   On: 04/30/2013 20:25    MDM   Final diagnoses:  None    This is a 23 y.o. female with PMH of abscess, DUB, presenting with pain associated with burns.  Onset prior to arrival. Located distal phalanges of long and ring fingers of the right hand.  Persistent. "Burning" in character.  No meds taken. Aggravated with palpation and movement. Negative for radiation. Negative for weakness, numbness, tingling.  Of note, this patient was entering this hospital to visit a family member in the ICU, she suffered a mechanical fall. Supposedly tripped on threshold. Landed on left knee. Now has left knee pain.  Throbbing. Located on jointline. Negative for weakness, numbness, tingling of the left lower extremity.  Vitals are within normal limits. The burns are minimal, all superficial. None are circumferential. None are full-thickness. All extremities are neurovascularly intact. Will apply bacitracin, treat with Norco.  Pertaining to knee pain, patient has tenderness to palpation of the left knee. However, she has full range of motion. There is no swelling, no redness, no effusion appreciated. The left lower extremity is neurovascularly intact. Will order x-ray of the left knee, reevaluate shortly.  X-ray left knee is negative for acute bony injury.  Pt stable for discharge, FU.  All questions answered.  Return precautions given.  I have discussed case and care has been guided by my attending physician, Dr. Jeraldine Loots.   Loma Boston, MD 05/01/13 [phone]

## 2013-05-01 ENCOUNTER — Telehealth: Payer: Self-pay | Admitting: *Deleted

## 2013-05-01 NOTE — Progress Notes (Addendum)
05/01/13 10:30pm  Beecher McardleW. Rico Massar RN BSN [phone] ED CM spoke with Dr. Clearance CootsHarper prior to end of shift, he states he will work on this at the end of his shift, left message for the daytime CM to follow-up in the am  05/01/13 6:50pm Beecher McardleW. Kimberlee Shoun RN BSN [phone] ED CM received incoming call from patient concerning a prescription  For vicodin she received [facility]. She reports that when she tried to have the prescription filled at [facility] in [place], she was told that the MD that wrote the prescription was not authorized to prescribe the medication. The prescription was written by Dr. Verdell CarmineS. Harper who is a resident. Contacted the [facility] pharmacy and was told that there was an issue with DEA number when processing. Dr. Clearance CootsHarper was notified and is currently working on getting the prescription replaced. Pt was notified and is agreeable. Pt states, she will come by tomorrow morning and return the old prescription and retrieve the replacement prescription.

## 2013-05-01 NOTE — Telephone Encounter (Signed)
Pt states got Dr. Ferguson message this am in regards to low progesterone level.  Pt states last month she "did not ovulate at all" was unsure if Dr. Ferguson was aware of this. Pt states she does not have a f/u did not know if she needed one or not. Please advise.  

## 2013-05-02 NOTE — ED Provider Notes (Signed)
This patient was seen in conjunction with the resident physician, Dr. Clearance CootsHarper.  The documentation accurately reflects the patient's encounter in the emergency department.  On my exam, vision female was in no distress. Patient's x-ray did not demonstrate acute new fracture, and patient was discharged after a discussion on wound care and burn care.  Gerhard Munchobert Jesyca Weisenburger, MD 05/02/13 [phone]

## 2013-05-08 ENCOUNTER — Telehealth: Payer: Self-pay | Admitting: *Deleted

## 2013-05-08 NOTE — Telephone Encounter (Signed)
Pt states that Dr. Emelda FearFerguson called her about 3 weeks ago, called about progesterone level. Pt thinks that [facility] has forgotten a lab that was done in February. Pt wants to know what the next step is. Pt has had 3-4 progesterone levels done, and HSG in the office in February. Pt just wants to know what she needs to do.   Pt states that she started taking clomid in June of 2014. Progesterone levels were drawn from then until now.  6/26-0.3 on 50 mg of clomid 7/28-21.4 on 100mg  of clomid 8/29- 21.2 12/14- 25.4 2/10- 24.5 3/11- 0.6 4/8- 4.1   Pt will wait until she hears from you, Pt's 20st day is May 9th.

## 2013-05-10 NOTE — Telephone Encounter (Signed)
I spoke with Dr. Emelda FearFerguson, He advised to have the pt come in for Progesterone level on the 8th and then make an appointment for a week after that to discuss next step.

## 2013-05-10 NOTE — Telephone Encounter (Signed)
Pt aware to make appointment for progesterone level and then appointment to follow up with [facility].

## 2013-05-13 NOTE — Telephone Encounter (Signed)
[facility]

## 2013-05-15 NOTE — Telephone Encounter (Signed)
Pt has an appt on 05/19/2013 for lab then 05/26/2013 with Dr. Emelda FearFerguson.

## 2013-05-19 ENCOUNTER — Other Ambulatory Visit: Payer: BC Managed Care – PPO

## 2013-05-19 DIAGNOSIS — N979 Female infertility, unspecified: Secondary | ICD-10-CM

## 2013-05-19 LAB — PROGESTERONE: PROGESTERONE: 13.6 ng/mL

## 2013-05-21 ENCOUNTER — Telehealth: Payer: Self-pay | Admitting: Obstetrics and Gynecology

## 2013-05-21 NOTE — Telephone Encounter (Signed)
Left message of Progesterone level indicates ovulation

## 2013-05-22 NOTE — Telephone Encounter (Signed)
Unable to reach pt , this number "unreachable" , tho tried several ways.

## 2013-05-22 NOTE — Telephone Encounter (Signed)
Left message with pt mother for pt to get us a working number.

## 2013-05-26 ENCOUNTER — Ambulatory Visit: Payer: BC Managed Care – PPO | Admitting: Obstetrics and Gynecology

## 2013-05-30 ENCOUNTER — Ambulatory Visit: Payer: BC Managed Care – PPO | Admitting: Obstetrics and Gynecology

## 2013-06-14 ENCOUNTER — Ambulatory Visit: Payer: BC Managed Care – PPO | Admitting: Obstetrics and Gynecology

## 2013-06-26 ENCOUNTER — Other Ambulatory Visit: Payer: Self-pay | Admitting: Obstetrics and Gynecology

## 2013-06-26 ENCOUNTER — Other Ambulatory Visit: Payer: Self-pay | Admitting: Adult Health

## 2013-06-26 ENCOUNTER — Telehealth: Payer: Self-pay | Admitting: *Deleted

## 2013-06-26 MED ORDER — LYSTEDA 650 MG PO TABS: 1300.0000 mg | ORAL_TABLET | Freq: Three times a day (TID) | ORAL | Status: DC

## 2013-06-26 NOTE — Telephone Encounter (Signed)
I spoke with Dr. Emelda FearFerguson and he advised no not while she is trying to get pregnant. Pt was advised of this and she stated that she didn't want the bleeding to stop but wanted it to lighten. Pt states that she started her period yesterday and that the past 2 morning she has a gush of blood that went through her clothes.

## 2013-06-28 ENCOUNTER — Other Ambulatory Visit: Payer: Self-pay | Admitting: Adult Health

## 2013-07-24 ENCOUNTER — Encounter (HOSPITAL_COMMUNITY): Payer: Self-pay | Admitting: Emergency Medicine

## 2013-07-24 ENCOUNTER — Emergency Department (HOSPITAL_COMMUNITY)
Admission: EM | Admit: 2013-07-24 | Discharge: 2013-07-24 | Disposition: A | Discharge status: Home / Self Care | Payer: BC Managed Care – PPO | Attending: Emergency Medicine | Admitting: Emergency Medicine

## 2013-07-24 DIAGNOSIS — L732 Hidradenitis suppurativa: Secondary | ICD-10-CM | POA: Insufficient documentation

## 2013-07-24 DIAGNOSIS — Z79899 Other long term (current) drug therapy: Secondary | ICD-10-CM | POA: Insufficient documentation

## 2013-07-24 DIAGNOSIS — F17200 Nicotine dependence, unspecified, uncomplicated: Secondary | ICD-10-CM | POA: Insufficient documentation

## 2013-07-24 DIAGNOSIS — Z880 Allergy status to penicillin: Secondary | ICD-10-CM | POA: Insufficient documentation

## 2013-07-24 MED ORDER — DOXYCYCLINE HYCLATE 100 MG PO CAPS: 100.0000 mg | ORAL_CAPSULE | Freq: Two times a day (BID) | ORAL | Status: DC

## 2013-07-24 MED ORDER — HYDROCODONE-ACETAMINOPHEN 7.5-325 MG PO TABS: 1.0000 | ORAL_TABLET | ORAL | Status: DC | PRN

## 2013-07-24 MED ORDER — DICLOFENAC SODIUM 75 MG PO TBEC: 75.0000 mg | DELAYED_RELEASE_TABLET | Freq: Two times a day (BID) | ORAL | Status: DC

## 2013-07-24 NOTE — ED Notes (Signed)
C.o abscess to rt axilla.

## 2013-07-24 NOTE — Discharge Instructions (Signed)
Please soak the left  under arm area and warm Epsom salt water 2 times daily for 15 minute intervals  until the abscess area has healed. Please use doxycycline and diclofenac 2 times daily. May use Norco for pain if needed. This medication may cause drowsiness, please use with caution. Please see Dr. Lovell SheehanJenkins for evaluation as soon as possible.  Hidradenitis Suppurativa, Sweat Gland Abscess Hidradenitis suppurativa is a long lasting (chronic), uncommon disease of the sweat glands. With this, boil-like lumps and scarring develop in the groin, some times under the arms (axillae), and under the breasts. It may also uncommonly occur behind the ears, in the crease of the buttocks, and around the genitals.  CAUSES  The cause is from a blocking of the sweat glands. They then become infected. It may cause drainage and odor. It is not contagious. So it cannot be given to someone else. It most often shows up in puberty (about 7010 to 24 years of age). But it may happen much later. It is similar to acne which is a disease of the sweat glands. This condition is slightly more common in African-Americans and women. SYMPTOMS   Hidradenitis usually starts as one or more red, tender, swellings in the groin or under the arms (axilla).  Over a period of hours to days the lesions get larger. They often open to the skin surface, draining clear to yellow-colored fluid.  The infected area heals with scarring. DIAGNOSIS  Your caregiver makes this diagnosis by looking at you. Sometimes cultures (growing germs on plates in the lab) may be taken. This is to see what germ (bacterium) is causing the infection.  TREATMENT   Topical germ killing medicine applied to the skin (antibiotics) are the treatment of choice. Antibiotics taken by mouth (systemic) are sometimes needed when the condition is getting worse or is severe.  Avoid tight-fitting clothing which traps moisture in.  Dirt does not cause hidradenitis and it is not  caused by poor hygiene.  Involved areas should be cleaned daily using an antibacterial soap. Some patients find that the liquid form of Lever 2000, applied to the involved areas as a lotion after bathing, can help reduce the odor related to this condition.  Sometimes surgery is needed to drain infected areas or remove scarred tissue. Removal of large amounts of tissue is used only in severe cases.  Birth control pills may be helpful.  Oral retinoids (vitamin A derivatives) for 6 to 12 months which are effective for acne may also help this condition.  Weight loss will improve but not cure hidradenitis. It is made worse by being overweight. But the condition is not caused by being overweight.  This condition is more common in people who have had acne.  It may become worse under stress. There is no medical cure for hidradenitis. It can be controlled, but not cured. The condition usually continues for years with periods of getting worse and getting better (remission). Document Released: 08/13/2003 Document Revised: 03/23/2011 Document Reviewed: 08/29/2007 [facility] Patient Information 2015 [employer] [place]. This information is not intended to replace advice given to you by your health care provider. Make sure you discuss any questions you have with your health care provider. You in a a and a you in a and a you in a and a and a and a you in

## 2013-07-24 NOTE — ED Provider Notes (Signed)
CSN: [id number]     Arrival date & time 07/24/13  1227 History  This chart was scribed for non-physician practitioner, Ivery QualeHobson Chief Walkup, PA-C working with Laray AngerKathleen M McManus, DO by Luisa DagoPriscilla Tutu, ED scribe. This patient was seen in room [facility] and the patient's care was started at 3:30 PM.    Chief Complaint  Patient presents with  . Abscess   The history is provided by the patient. No language interpreter was used.   HPI Comments: Kathleen Velasquez is a 23 y.o. female with a prior history of abscesses presents to the Emergency Department complaining of an abscess to left axilla that she first noticed approximately 5 days ago. Pt states that she has a cluster of cyst underneath her left axilla. She states that she noted 3 of them, but two of them are pushing up on the third one. Pt is requesting for the cyst to be drained. She states that in the past she was prescribed Doxycycline with minimal relief so she knows that being treated with antibiotics will not get rid of her symptoms. Pt reports similar symptoms in the past but to her right axilla. She states the cysts to her right axilla were surgically removed. Due to financial reasons pt states that she is unable to have the same procedure done. She reports an allergic reaction to Cipro and Penicillin. Denies any fever, chills, diaphoresis, nausea, or emesis.    Past Medical History  Diagnosis Date  . Abscess     right buttocks/thigh  . DUB (dysfunctional uterine bleeding) 05/25/2012   Past Surgical History  Procedure Laterality Date  . Hernia repair    . Tonsillectomy    . Adenoidectomy    . Hydradenitis excision  10/02/2011    Procedure: EXCISION HYDRADENITIS AXILLA;  Surgeon: Fabio BeringBrent C Ziegler, MD;  Location: [facility];  Service: General;  Laterality: Right;  Right Axillary Dissection  . Cholecystectomy  01/27/2012    Procedure: LAPAROSCOPIC CHOLECYSTECTOMY;  Surgeon: Fabio BeringBrent C Ziegler, MD;  Location: [facility];  Service: General;  Laterality:  N/A;   Family History  Problem Relation Age of Onset  . Diabetes Mother   . Hypertension Mother   . Hyperlipidemia Mother   . Diabetes Maternal Grandmother   . Hypertension Maternal Grandmother   . Heart disease Maternal Grandmother     CHF  . COPD Maternal Grandmother   . Cirrhosis Maternal Grandmother   . Cancer Maternal Grandfather     lung  . Diabetes Paternal Grandmother   . Diabetes Paternal Grandfather   . Aneurysm Paternal Grandfather   . Stroke Other    History  Substance Use Topics  . Smoking status: Current Every Day Smoker -- 0.50 packs/day for 5 years    Types: Cigarettes  . Smokeless tobacco: Never Used  . Alcohol Use: Yes     Comment: social use   OB History   Grav Para Term Preterm Abortions TAB SAB Ect Mult Living   0              Review of Systems  Constitutional: Negative for fatigue and unexpected weight change.  Respiratory: Negative for chest tightness and shortness of breath.   Cardiovascular: Negative for chest pain, palpitations and leg swelling.  Gastrointestinal: Negative for abdominal pain and blood in stool.  Musculoskeletal: Positive for joint swelling (abscess to left axilla).  Neurological: Negative for dizziness, syncope, light-headedness and headaches.  All other systems reviewed and are negative.  Allergies  Bactrim; Ciprofloxacin; Other; Penicillins; and Percocet  Home Medications   Prior to Admission medications   Medication Sig Start Date End Date Taking? Authorizing Provider  acetaminophen (TYLENOL) 325 MG tablet Take 650 mg by mouth every 6 (six) hours as needed for pain.    Historical Provider, MD  clomiPHENE (CLOMID) 50 MG tablet Take 2 tablets (100 mg total) by mouth daily. On days 3-7 of cycle 02/03/13   Tilda Burrow, MD  HYDROcodone-acetaminophen (NORCO/VICODIN) 5-325 MG per tablet Take 1 tablet by mouth every 6 (six) hours as needed. 04/30/13   Loma Boston, MD  LYSTEDA 650 MG TABS tablet Take 2 tablets (1,300 mg  total) by mouth 3 (three) times daily. During menses 06/26/13   Tilda Burrow, MD   BP 133/67  Pulse 81  Temp(Src) 99 F (37.2 C) (Oral)  Resp 18  SpO2 98%  LMP 06/19/2013  Physical Exam  Nursing note and vitals reviewed. Constitutional: She is oriented to person, place, and time. She appears well-developed and well-nourished. No distress.  HENT:  Head: Normocephalic and atraumatic.  Eyes: Conjunctivae and EOM are normal.  Neck: Neck supple.  Cardiovascular: Normal rate.   Pulmonary/Chest: Effort normal. No respiratory distress.  Musculoskeletal: Normal range of motion.  Left Axilla: At the left axilla with multiple small, tender abscess. One of them with mild drainage. No red streaks appreciated. No palpable lymphadenopathy. No other cellulitis areas in the left axilla. The radial pulse is 2+.  Capillary refill on the left is less than 2 seconds.   Neurological: She is alert and oriented to person, place, and time.  Skin: Skin is warm and dry.  Psychiatric: She has a normal mood and affect. Her behavior is normal.    ED Course  Procedures (including critical care time)  DIAGNOSTIC STUDIES: Oxygen Saturation is 98% on RA, normal by my interpretation.    COORDINATION OF CARE: 3:37 PM- Told pt that the abscesses are not large enough to be drained here in the ED. Advised pt to continue with warm soaks. Will prescribe Doxycycline, diclofenac, and Norco. Pt advised of plan for treatment and pt agrees.  Labs Review Labs Reviewed - No data to display  Imaging Review No results found.   EKG Interpretation None      MDM Patient has a history of recurrent abscess issues involving the axilla. She's been treated for  hydradenitis on the right side. The abscess areas are small on the left today. One is rather deep to palpation. These are not candidates for incision and drainage here in the emergency department at this time. The plan is for the patient to be treated with  doxycycline, diclofenac, and Norco. The patient is referred to Dr. Lovell Sheehan, who did her first surgery.    Final diagnoses:  None    **I have reviewed nursing notes, vital signs, and all appropriate lab and imaging results for this patient.*  I personally performed the services described in this documentation, which was scribed in my presence. The recorded information has been reviewed and is accurate.       Kathie Dike, PA-C 07/24/13 1554

## 2013-07-25 NOTE — ED Provider Notes (Signed)
Medical screening examination/treatment/procedure(s) were performed by non-physician practitioner and as supervising physician I was immediately available for consultation/collaboration.   EKG Interpretation None        Jenita Rayfield M Marquisa Salih, DO 07/25/13 2356 

## 2013-09-12 ENCOUNTER — Emergency Department (HOSPITAL_COMMUNITY)
Admission: EM | Admit: 2013-09-12 | Discharge: 2013-09-12 | Disposition: A | Discharge status: Home / Self Care | Payer: BC Managed Care – PPO | Attending: Emergency Medicine | Admitting: Emergency Medicine

## 2013-09-12 ENCOUNTER — Encounter (HOSPITAL_COMMUNITY): Payer: Self-pay | Admitting: Emergency Medicine

## 2013-09-12 DIAGNOSIS — F17200 Nicotine dependence, unspecified, uncomplicated: Secondary | ICD-10-CM | POA: Insufficient documentation

## 2013-09-12 DIAGNOSIS — L732 Hidradenitis suppurativa: Secondary | ICD-10-CM | POA: Diagnosis not present

## 2013-09-12 DIAGNOSIS — Z79899 Other long term (current) drug therapy: Secondary | ICD-10-CM | POA: Insufficient documentation

## 2013-09-12 DIAGNOSIS — Z880 Allergy status to penicillin: Secondary | ICD-10-CM | POA: Diagnosis not present

## 2013-09-12 DIAGNOSIS — IMO0002 Reserved for concepts with insufficient information to code with codable children: Secondary | ICD-10-CM | POA: Diagnosis present

## 2013-09-12 MED ORDER — LIDOCAINE HCL (PF) 2 % IJ SOLN
10.0000 mL | Freq: Once | INTRAMUSCULAR | Status: AC
  Administered 2013-09-12: 10 mL
  Filled 2013-09-12: qty 10

## 2013-09-12 MED ORDER — CLINDAMYCIN HCL 150 MG PO CAPS: 150.0000 mg | ORAL_CAPSULE | Freq: Four times a day (QID) | ORAL | Status: DC

## 2013-09-12 MED ORDER — HYDROCODONE-ACETAMINOPHEN 7.5-325 MG PO TABS: 1.0000 | ORAL_TABLET | ORAL | Status: DC | PRN

## 2013-09-12 MED ORDER — ONDANSETRON 4 MG PO TBDP
ORAL_TABLET | ORAL | Status: AC
  Filled 2013-09-12: qty 1

## 2013-09-12 MED ORDER — HYDROXYZINE HCL 25 MG PO TABS
25.0000 mg | ORAL_TABLET | Freq: Once | ORAL | Status: AC
  Administered 2013-09-12: 25 mg via ORAL
  Filled 2013-09-12: qty 1

## 2013-09-12 MED ORDER — ONDANSETRON 4 MG PO TBDP
4.0000 mg | ORAL_TABLET | Freq: Once | ORAL | Status: AC
  Administered 2013-09-12: 4 mg via ORAL

## 2013-09-12 MED ORDER — HYDROMORPHONE HCL PF 1 MG/ML IJ SOLN
1.0000 mg | Freq: Once | INTRAMUSCULAR | Status: AC
  Administered 2013-09-12: 1 mg via INTRAMUSCULAR
  Filled 2013-09-12: qty 1

## 2013-09-12 MED ORDER — KETOROLAC TROMETHAMINE 10 MG PO TABS
10.0000 mg | ORAL_TABLET | Freq: Once | ORAL | Status: AC
  Administered 2013-09-12: 10 mg via ORAL
  Filled 2013-09-12: qty 1

## 2013-09-12 MED ORDER — PROMETHAZINE HCL 12.5 MG PO TABS
12.5000 mg | ORAL_TABLET | Freq: Once | ORAL | Status: AC
  Administered 2013-09-12: 12.5 mg via ORAL
  Filled 2013-09-12: qty 1

## 2013-09-12 MED ORDER — HYDROCODONE-ACETAMINOPHEN 5-325 MG PO TABS
2.0000 | ORAL_TABLET | Freq: Once | ORAL | Status: AC
  Administered 2013-09-12: 2 via ORAL
  Filled 2013-09-12: qty 2

## 2013-09-12 MED ORDER — CLINDAMYCIN HCL 150 MG PO CAPS
300.0000 mg | ORAL_CAPSULE | Freq: Once | ORAL | Status: AC
  Administered 2013-09-12: 300 mg via ORAL
  Filled 2013-09-12: qty 2

## 2013-09-12 NOTE — Care Management Note (Signed)
Asked to see pt by H. Beverely Pace, [place]. Pt needs to see a surgeon, but has a very high deductible with her insurance, and cannot afford to do this. Pt was given information about [facility] financial counselors, and phone number to call to apply for assistance through the [facility] assistance program. Pt was very appreciative of this.

## 2013-09-12 NOTE — ED Notes (Signed)
Lt axilla abscess. Since yesterday. Seen here for same in past.

## 2013-09-12 NOTE — ED Provider Notes (Signed)
CSN: [id number]     Arrival date & time 09/12/13  1335 History   None    Chief Complaint  Patient presents with  . Abscess     (Consider location/radiation/quality/duration/timing/severity/associated sxs/prior Treatment) HPI Comments: The patient is a 23-year-old female who presents to the emergency department with an abscess of the left armpit area. The patient has a history of hydradenitis. She was seen in the emergency department approximately a month ago for the same. She was advised to see a Psychologist, sport and exercise. She went to see a Psychologist, sport and exercise, but found that her insurance deductible have not been met. She states that she is unable to meet the deductible and pay for the required surgery at this time. She states she was told to come back to the emergency department to see if there was something that could. Patient states she's been having some chills, but has not measured her temperature at home. She's not had any nausea vomiting. She has pain with certain movement of the arm, particularly raising her left arm over her head. She's not had any drainage from the axilla. She presents now for assistance with this abscess formation problem.  Patient is a 23 y.o. female presenting with abscess. The history is provided by the patient.  Abscess Location:  Shoulder/arm Shoulder/arm abscess location:  L axilla Associated symptoms: no fever     Past Medical History  Diagnosis Date  . Abscess     right buttocks/thigh  . DUB (dysfunctional uterine bleeding) 05/25/2012   Past Surgical History  Procedure Laterality Date  . Hernia repair    . Tonsillectomy    . Adenoidectomy    . Hydradenitis excision  10/02/2011    Procedure: EXCISION HYDRADENITIS AXILLA;  Surgeon: Donato Heinz, MD;  Location: [facility];  Service: General;  Laterality: Right;  Right Axillary Dissection  . Cholecystectomy  01/27/2012    Procedure: LAPAROSCOPIC CHOLECYSTECTOMY;  Surgeon: Donato Heinz, MD;  Location: [facility];  Service: General;   Laterality: N/A;   Family History  Problem Relation Age of Onset  . Diabetes Mother   . Hypertension Mother   . Hyperlipidemia Mother   . Diabetes Maternal Grandmother   . Hypertension Maternal Grandmother   . Heart disease Maternal Grandmother     CHF  . COPD Maternal Grandmother   . Cirrhosis Maternal Grandmother   . Cancer Maternal Grandfather     lung  . Diabetes Paternal Grandmother   . Diabetes Paternal Grandfather   . Aneurysm Paternal Grandfather   . Stroke Other    History  Substance Use Topics  . Smoking status: Current Every Day Smoker -- 0.50 packs/day for 5 years    Types: Cigarettes  . Smokeless tobacco: Never Used  . Alcohol Use: Yes     Comment: social use   OB History   Grav Para Term Preterm Abortions TAB SAB Ect Mult Living   0              Review of Systems  Constitutional: Positive for chills. Negative for fever, activity change and appetite change.       All ROS Neg except as noted in HPI  HENT: Negative.   Eyes: Negative for photophobia and discharge.  Respiratory: Negative for cough, shortness of breath and wheezing.   Cardiovascular: Negative for chest pain and palpitations.  Gastrointestinal: Negative for abdominal pain and blood in stool.  Genitourinary: Negative for dysuria, frequency and hematuria.  Musculoskeletal: Negative for arthralgias, back pain and neck pain.  Skin: Negative.   Neurological: Negative for dizziness, seizures and speech difficulty.  Psychiatric/Behavioral: Negative for hallucinations and confusion.      Allergies  Bactrim; Ciprofloxacin; Other; Penicillins; and Percocet  Home Medications   Prior to Admission medications   Medication Sig Start Date End Date Taking? Authorizing Provider  acetaminophen (TYLENOL) 500 MG tablet Take 1,000 mg by mouth every 6 (six) hours as needed for moderate pain.   Yes Historical Provider, MD  busPIRone (BUSPAR) 15 MG tablet Take 1 tablet by mouth 2 (two) times daily. 07/31/13   Yes Historical Provider, MD  ibuprofen (ADVIL,MOTRIN) 200 MG tablet Take 600-800 mg by mouth every 6 (six) hours as needed for moderate pain.   Yes Historical Provider, MD   BP 134/80  Pulse 88  Temp(Src) 99.4 F (37.4 C) (Oral)  Resp 18  Ht 5' 4" (1.626 m)  Wt 222 lb (100.699 kg)  BMI 38.09 kg/m2  SpO2 99%  LMP 08/29/2013 Physical Exam  Nursing note and vitals reviewed. Constitutional: She is oriented to person, place, and time. She appears well-developed and well-nourished.  Non-toxic appearance.  HENT:  Head: Normocephalic.  Right Ear: Tympanic membrane and external ear normal.  Left Ear: Tympanic membrane and external ear normal.  Eyes: EOM and lids are normal. Pupils are equal, round, and reactive to light.  Neck: Normal range of motion. Neck supple. Carotid bruit is not present.  Cardiovascular: Normal rate, regular rhythm, normal heart sounds, intact distal pulses and normal pulses.   Pulmonary/Chest: Breath sounds normal. No respiratory distress.  Abdominal: Soft. Bowel sounds are normal. There is no tenderness. There is no guarding.  Musculoskeletal: Normal range of motion.  There is a red raised tender abscess area at the center of the left axilla. There are satellite abscess areas above and below the mean abscess area. There is no drainage appreciated. There is no red streaking noted. The brachial and radial pulses are 2+. There is full range of motion of the fingers and wrist and elbow.  Lymphadenopathy:       Head (right side): No submandibular adenopathy present.       Head (left side): No submandibular adenopathy present.    She has no cervical adenopathy.  Neurological: She is alert and oriented to person, place, and time. She has normal strength. No cranial nerve deficit or sensory deficit.  Skin: Skin is warm and dry.  Psychiatric: She has a normal mood and affect. Her speech is normal.    ED Course  INCISION AND DRAINAGE Date/Time: 09/12/2013 5:05 PM Performed  by: BRYANT, HOBSON M Authorized by: BRYANT, HOBSON M Risks and benefits: risks, benefits and alternatives were discussed Consent given by: patient Patient understanding: patient states understanding of the procedure being performed Required items: required blood products, implants, devices, and special equipment available Patient identity confirmed: arm band Time out: Immediately prior to procedure a "time out" was called to verify the correct patient, procedure, equipment, support staff and site/side marked as required. Type: abscess Body area: upper extremity (left axilla) Anesthesia method: Local anesthesia with 1% lidocaine aborted due to to patient complained of severe pain. Patient sedated: no Needle gauge: 18 Drainage: purulent Drainage amount: moderate Patient tolerance: Patient tolerated the procedure well with no immediate complications. Comments: 5ml of purulent material aspirated from the abscess site. Culture sent to the lab.   (including critical care time) Labs Review Labs Reviewed - No data to display  Imaging Review No results found.   EKG Interpretation None        MDM Patient was sent to the emergency room from the surgeon's office because she did not have any procedure done to remove the hydradenitis abscesses from her left axilla. The patient has not met her to Dr., and she states she does not have the financial ability to pay for the deductible or discharges from the surgeon.  Temperature is 99.4. Patient having increasing pain in the axilla. Attempts were made to do an incision and drainage, however when the numbing agent (lidocaine 2%) was injected the patient had severe pain and asked that the procedure stop. The area was aspirated with an 18-gauge needle, and 5 cc of purulent foul-smelling material was removed. A sterile dressing was applied by me. Culture was sent to the lab. The patient will be placed on clindamycin 150 mg 4 times daily, and hydrocodone 7.5  mg. Patient is advised to seek the assistance of an other surgeon or perhaps even one of the [facility] to assist her with this hydradenitis problem. Patient is in agreement with the discharge plan.    Final diagnoses:  Hidradenitis    *I have reviewed nursing notes, vital signs, and all appropriate lab and imaging results for this patient.    Hobson M Bryant, PA-C 09/12/13 1712 

## 2013-09-12 NOTE — Discharge Instructions (Signed)
Please soak the left armpit area and warm Epsom salt water for 15 minutes daily until the infection improves. Please use Cleocin with breakfast lunch dinner and at bedtime. Use ibuprofen every 6 hours for mild pain, use Norco every 4 hours for more severe pain. This medication may cause drowsiness, please use with caution. Please see a surgeon as sone as possible concerning your infection under the arm area. Hidradenitis Suppurativa, Sweat Gland Abscess Hidradenitis suppurativa is a long lasting (chronic), uncommon disease of the sweat glands. With this, boil-like lumps and scarring develop in the groin, some times under the arms (axillae), and under the breasts. It may also uncommonly occur behind the ears, in the crease of the buttocks, and around the genitals.  CAUSES  The cause is from a blocking of the sweat glands. They then become infected. It may cause drainage and odor. It is not contagious. So it cannot be given to someone else. It most often shows up in puberty (about 69 to 24 years of age). But it may happen much later. It is similar to acne which is a disease of the sweat glands. This condition is slightly more common in African-Americans and women. SYMPTOMS   Hidradenitis usually starts as one or more red, tender, swellings in the groin or under the arms (axilla).  Over a period of hours to days the lesions get larger. They often open to the skin surface, draining clear to yellow-colored fluid.  The infected area heals with scarring. DIAGNOSIS  Your caregiver makes this diagnosis by looking at you. Sometimes cultures (growing germs on plates in the lab) may be taken. This is to see what germ (bacterium) is causing the infection.  TREATMENT   Topical germ killing medicine applied to the skin (antibiotics) are the treatment of choice. Antibiotics taken by mouth (systemic) are sometimes needed when the condition is getting worse or is severe.  Avoid tight-fitting clothing which traps  moisture in.  Dirt does not cause hidradenitis and it is not caused by poor hygiene.  Involved areas should be cleaned daily using an antibacterial soap. Some patients find that the liquid form of Lever 2000, applied to the involved areas as a lotion after bathing, can help reduce the odor related to this condition.  Sometimes surgery is needed to drain infected areas or remove scarred tissue. Removal of large amounts of tissue is used only in severe cases.  Birth control pills may be helpful.  Oral retinoids (vitamin A derivatives) for 6 to 12 months which are effective for acne may also help this condition.  Weight loss will improve but not cure hidradenitis. It is made worse by being overweight. But the condition is not caused by being overweight.  This condition is more common in people who have had acne.  It may become worse under stress. There is no medical cure for hidradenitis. It can be controlled, but not cured. The condition usually continues for years with periods of getting worse and getting better (remission). Document Released: 08/13/2003 Document Revised: 03/23/2011 Document Reviewed: 03/31/2013 [facility] Patient Information 2015 [place], [place]. This information is not intended to replace advice given to you by your health care provider. Make sure you discuss any questions you have with your health care provider.

## 2013-09-13 NOTE — ED Provider Notes (Signed)
Medical screening examination/treatment/procedure(s) were performed by non-physician practitioner and as supervising physician I was immediately available for consultation/collaboration.   EKG Interpretation None        Gisell Buehrle L Miangel Flom, MD 09/13/13 1538 

## 2013-09-15 LAB — CULTURE, ROUTINE-ABSCESS

## 2013-09-17 ENCOUNTER — Telehealth (HOSPITAL_BASED_OUTPATIENT_CLINIC_OR_DEPARTMENT_OTHER): Payer: Self-pay

## 2013-09-17 NOTE — Telephone Encounter (Signed)
Post ED Visit - Positive Culture Follow-up  Culture report reviewed by antimicrobial stewardship pharmacist:  Wes Dulaney, Pharm.D., BCPS  Celedonio Miyamoto, Pharm.D., BCPS  Georgina Pillion, [address].D., BCPS  [place], [address].D., BCPS, AAHIVP  Estella Husk, Pharm.D., BCPS, AAHIVP  Carly Sabat, Pharm.D.  Enzo Bi, [address].D.  Positive Abscess culture Treated with Clindamycin, organism sensitive to the same and no further patient follow-up is required at this time.  Arvid Right 09/17/2013, 5:17 AM

## 2013-09-27 ENCOUNTER — Ambulatory Visit (INDEPENDENT_AMBULATORY_CARE_PROVIDER_SITE_OTHER): Payer: BLUE CROSS/BLUE SHIELD | Admitting: Obstetrics and Gynecology

## 2013-09-27 ENCOUNTER — Encounter: Payer: Self-pay | Admitting: Obstetrics and Gynecology

## 2013-09-27 VITALS — BP 141/70 | HR 79 | Temp 99.5°F | Resp 20 | Ht 64.0 in | Wt 236.4 lb

## 2013-09-27 DIAGNOSIS — N920 Excessive and frequent menstruation with regular cycle: Secondary | ICD-10-CM | POA: Diagnosis not present

## 2013-09-27 LAB — CBC
HCT: 41.6 % (ref 36.0–46.0)
Hemoglobin: 13.9 g/dL (ref 12.0–15.0)
MCH: 28 pg (ref 26.0–34.0)
MCHC: 33.4 g/dL (ref 30.0–36.0)
MCV: 83.9 fL (ref 78.0–100.0)
Platelets: 415 10*3/uL — ABNORMAL HIGH (ref 150–400)
RBC: 4.96 MIL/uL (ref 3.87–5.11)
RDW: 17.2 % — AB (ref 11.5–15.5)
WBC: 9.9 10*3/uL (ref 4.0–10.5)

## 2013-09-27 MED ORDER — ETONOGESTREL-ETHINYL ESTRADIOL 0.12-0.015 MG/24HR VA RING: VAGINAL_RING | VAGINAL | Status: DC

## 2013-09-27 NOTE — Progress Notes (Signed)
Patient ID: Kathleen Velasquez, female   DOB: 08/12/1990, 23 y.o.   MRN: [id number] 23 yo G0 here for management of menorrhagia. Patient has a 2-year history of infertility and has undergone 11 rounds of clomid without success. She has had a normal HSG. Patient is here today because she has stopped clomid treatment and has experienced heavier vaginal bleeding. She is very frustrated and is contemplating a hysterectomy. She is no longer interested in conception secondary to her lack of success. Patient reports monthly cycles lasting 8-10 days heavy with passage of clots. Patient reports her cycles have always been this heavy and has responded well to birth control.  Past Medical History  Diagnosis Date  . Abscess     right buttocks/thigh  . DUB (dysfunctional uterine bleeding) 05/25/2012   Past Surgical History  Procedure Laterality Date  . Hernia repair    . Tonsillectomy    . Adenoidectomy    . Hydradenitis excision  10/02/2011    Procedure: EXCISION HYDRADENITIS AXILLA;  Surgeon: Fabio Bering, MD;  Location: [facility];  Service: General;  Laterality: Right;  Right Axillary Dissection  . Cholecystectomy  01/27/2012    Procedure: LAPAROSCOPIC CHOLECYSTECTOMY;  Surgeon: Fabio Bering, MD;  Location: [facility];  Service: General;  Laterality: N/A;   Family History  Problem Relation Age of Onset  . Diabetes Mother   . Hypertension Mother   . Hyperlipidemia Mother   . Neuropathy Mother   . Diabetes Maternal Grandmother   . Hypertension Maternal Grandmother   . Heart disease Maternal Grandmother     CHF  . COPD Maternal Grandmother   . Cirrhosis Maternal Grandmother   . Cancer Maternal Grandfather     lung  . Diabetes Paternal Grandmother   . Diabetes Paternal Grandfather   . Aneurysm Paternal Grandfather   . Stroke Other    History  Substance Use Topics  . Smoking status: Current Every Day Smoker -- 0.50 packs/day for 5 years    Types: Cigarettes  . Smokeless tobacco: Never Used  .  Alcohol Use: Yes     Comment: social use   GENERAL: Well-developed, well-nourished female in no acute distress.  ABDOMEN: Soft, nontender, nondistended. No organomegaly. Obese PELVIC: Normal external female genitalia. Vagina is pink and rugated.  Normal discharge. Normal appearing cervix. Bimanual exam limited secondary to body habitus EXTREMITIES: No cyanosis, clubbing, or edema, 2+ distal pulses.  A/P 23 yo with menorrhagia - Long discussion regarding infertility and need to meet with infertility specialist - Rx NuvaRing provided. Patient advised to take a break from conception  - When ready, patient should start prenatal vitamins at least 3 months before trying and to seek the help of an infertility specialist - RTC prn

## 2013-09-30 ENCOUNTER — Encounter (HOSPITAL_COMMUNITY): Payer: Self-pay | Admitting: Emergency Medicine

## 2013-09-30 ENCOUNTER — Emergency Department (HOSPITAL_COMMUNITY)
Admission: EM | Admit: 2013-09-30 | Discharge: 2013-09-30 | Disposition: A | Discharge status: Home / Self Care | Payer: BC Managed Care – PPO | Attending: Emergency Medicine | Admitting: Emergency Medicine

## 2013-09-30 DIAGNOSIS — IMO0002 Reserved for concepts with insufficient information to code with codable children: Secondary | ICD-10-CM | POA: Insufficient documentation

## 2013-09-30 DIAGNOSIS — Z8742 Personal history of other diseases of the female genital tract: Secondary | ICD-10-CM | POA: Diagnosis not present

## 2013-09-30 DIAGNOSIS — F17200 Nicotine dependence, unspecified, uncomplicated: Secondary | ICD-10-CM | POA: Insufficient documentation

## 2013-09-30 DIAGNOSIS — Z79899 Other long term (current) drug therapy: Secondary | ICD-10-CM | POA: Diagnosis not present

## 2013-09-30 DIAGNOSIS — L732 Hidradenitis suppurativa: Secondary | ICD-10-CM

## 2013-09-30 MED ORDER — CLINDAMYCIN HCL 300 MG PO CAPS: 300.0000 mg | ORAL_CAPSULE | Freq: Four times a day (QID) | ORAL | Status: DC

## 2013-09-30 MED ORDER — HYDROCODONE-ACETAMINOPHEN 5-325 MG PO TABS
2.0000 | ORAL_TABLET | Freq: Once | ORAL | Status: AC
  Administered 2013-09-30: 2 via ORAL
  Filled 2013-09-30: qty 2

## 2013-09-30 MED ORDER — ONDANSETRON 4 MG PO TBDP: 4.0000 mg | ORAL_TABLET | Freq: Three times a day (TID) | ORAL | Status: DC | PRN

## 2013-09-30 MED ORDER — CLINDAMYCIN HCL 300 MG PO CAPS
300.0000 mg | ORAL_CAPSULE | Freq: Once | ORAL | Status: AC
  Administered 2013-09-30: 300 mg via ORAL
  Filled 2013-09-30: qty 1

## 2013-09-30 MED ORDER — ONDANSETRON 4 MG PO TBDP
4.0000 mg | ORAL_TABLET | Freq: Once | ORAL | Status: AC
  Administered 2013-09-30: 4 mg via ORAL
  Filled 2013-09-30: qty 1

## 2013-09-30 MED ORDER — LIDOCAINE-EPINEPHRINE 2 %-1:100000 IJ SOLN
20.0000 mL | Freq: Once | INTRAMUSCULAR | Status: DC
  Filled 2013-09-30: qty 20

## 2013-09-30 MED ORDER — LIDOCAINE-EPINEPHRINE (PF) 2 %-1:200000 IJ SOLN
10.0000 mL | INTRAMUSCULAR | Status: AC
  Administered 2013-09-30: 10 mL via INTRADERMAL
  Filled 2013-09-30: qty 20

## 2013-09-30 MED ORDER — HYDROCODONE-ACETAMINOPHEN 5-325 MG PO TABS: 1.0000 | ORAL_TABLET | ORAL | Status: DC | PRN

## 2013-09-30 NOTE — ED Provider Notes (Signed)
TIME SEEN: 6:10 PM  CHIEF COMPLAINT: Left axillary abscess  HPI: Patient is a 23 year old with history of hidradenitis status post resection in her right axilla who presents to the emergency department with complaints of a left axillary abscess for the past several days. She was seen in any pain and had a needle aspiration and she was unable to tolerate incision and drainage. She was discharged on the myosin which she reports she finished. She states that it is becoming more painful and swollen without drainage. Denies fever, chills, nausea, vomiting or diarrhea. She states she was told to followup with a surgeon but cannot afford the deductible on her insurance at this time. She is not a diabetic.  ROS: See HPI Constitutional: no fever  Eyes: no drainage  ENT: no runny nose   Cardiovascular:  no chest pain  Resp: no SOB  GI: no vomiting GU: no dysuria Integumentary: no rash  Allergy: no hives  Musculoskeletal: no leg swelling  Neurological: no slurred speech ROS otherwise negative  PAST MEDICAL HISTORY/PAST SURGICAL HISTORY:  Past Medical History  Diagnosis Date  . Abscess     right buttocks/thigh  . DUB (dysfunctional uterine bleeding) 05/25/2012    MEDICATIONS:  Prior to Admission medications   Medication Sig Start Date End Date Taking? Authorizing Provider  acetaminophen (TYLENOL) 500 MG tablet Take 1,000 mg by mouth every 6 (six) hours as needed for moderate pain.   Yes Historical Provider, MD  busPIRone (BUSPAR) 15 MG tablet Take 1 tablet by mouth 2 (two) times daily. 07/31/13  Yes Historical Provider, MD  etonogestrel-ethinyl estradiol (NUVARING) 0.12-0.015 MG/24HR vaginal ring Insert vaginally and leave in place for 3 consecutive weeks, then remove for 1 week. 09/27/13  Yes Peggy Constant, MD  ibuprofen (ADVIL,MOTRIN) 200 MG tablet Take 600-800 mg by mouth every 6 (six) hours as needed for moderate pain.   Yes Historical Provider, MD    ALLERGIES:  Allergies  Allergen  Reactions  . Bactrim Hives and Itching  . Ciprofloxacin Hives and Itching    CRNA Discontinued preop antibiotic  IVPB  Cipro in OR after developed itching and hives left arm.  Received Benadryl with relief noted. Dr.Gonzalez and Dr. Leticia Penna informed. DDallasRN  . Other Other (See Comments)    [place] causes a hives, swelling, itching, and anaphylaxis.   Marland Kitchen Penicillins Hives and Itching  . Percocet [Oxycodone-Acetaminophen] Other (See Comments)    Face flushes    SOCIAL HISTORY:  History  Substance Use Topics  . Smoking status: Current Every Day Smoker -- 0.50 packs/day for 5 years    Types: Cigarettes  . Smokeless tobacco: Never Used  . Alcohol Use: Yes     Comment: social use    FAMILY HISTORY: Family History  Problem Relation Age of Onset  . Diabetes Mother   . Hypertension Mother   . Hyperlipidemia Mother   . Neuropathy Mother   . Diabetes Maternal Grandmother   . Hypertension Maternal Grandmother   . Heart disease Maternal Grandmother     CHF  . COPD Maternal Grandmother   . Cirrhosis Maternal Grandmother   . Cancer Maternal Grandfather     lung  . Diabetes Paternal Grandmother   . Diabetes Paternal Grandfather   . Aneurysm Paternal Grandfather   . Stroke Other     EXAM: BP 111/53  Pulse 64  Temp(Src) 98.7 F (37.1 C) (Oral)  Resp 18  Ht  (1.626 m)  Wt 236 lb (107.049 kg)  BMI  40.49 kg/m2  SpO2 99%  LMP 09/24/2013 CONSTITUTIONAL: Alert and oriented and responds appropriately to questions. Well-appearing; well-nourished HEAD: Normocephalic EYES: Conjunctivae clear, PERRL ENT: normal nose; no rhinorrhea; moist mucous membranes; pharynx without lesions noted NECK: Supple, no meningismus, no LAD  CARD: RRR; S1 and S2 appreciated; no murmurs, no clicks, no rubs, no gallops RESP: Normal chest excursion without splinting or tachypnea; breath sounds clear and equal bilaterally; no wheezes, no rhonchi, no rales,  ABD/GI: Normal bowel sounds; non-distended;  soft, non-tender, no rebound, no guarding BACK:  The back appears normal and is non-tender to palpation, there is no CVA tenderness EXT: Patient has mild hidradenitis to her left axilla with a 3 x 1 cm fluctuant area without drainage, there is a very minimal amount of surrounding erythema and induration, no joint effusion, 2+ radial pulses bilaterally, sensation to light touch intact diffusely, Normal ROM in all joints; non-tender to palpation; no edema; normal capillary refill; no cyanosis    SKIN: Normal color for age and race; warm NEURO: Moves all extremities equally PSYCH: The patient's mood and manner are appropriate. Grooming and personal hygiene are appropriate.  MEDICAL DECISION MAKING: Patient here with abscess to her left axilla. I have I&D this area with minimal drainage. She does have some surrounding cellulitis. Will discharge on clindamycin and with pain medication. Have reassured her the importance of outpatient followup. Have recommended frequent warm compresses. Have discussed strict return precautions. She verbalized understanding and is comfortable with plan.    INCISION AND DRAINAGE Performed by: Raelyn Number Consent: Verbal consent obtained. Risks and benefits: risks, benefits and alternatives were discussed Type: abscess  Body area: Left axilla  Anesthesia: local infiltration  Incision was made with a scalpel.  Local anesthetic: lidocaine 2 % with epinephrine  Anesthetic total: 6 ml  Complexity: complex Blunt dissection to break up loculations  Drainage: purulent  Drainage amount: Minimal    Patient tolerance: Patient tolerated the procedure well with no immediate complications.       Layla Maw Beretta Ginsberg, DO 09/30/13 1921

## 2013-09-30 NOTE — ED Notes (Signed)
Pt to ED for evaluation of multiple abscesses to left armpit, pt has hx of hydradenitis- is scheduled to see a surgeon but cannot afford deductible for procedure.  Denies fever or chills at home- pt last had abscess drained at beginning of this month at [facility].

## 2013-09-30 NOTE — Discharge Instructions (Signed)
Hidradenitis Suppurativa, Sweat Gland Abscess Hidradenitis suppurativa is a long lasting (chronic), uncommon disease of the sweat glands. With this, boil-like lumps and scarring develop in the groin, some times under the arms (axillae), and under the breasts. It may also uncommonly occur behind the ears, in the crease of the buttocks, and around the genitals.  CAUSES  The cause is from a blocking of the sweat glands. They then become infected. It may cause drainage and odor. It is not contagious. So it cannot be given to someone else. It most often shows up in puberty (about 10 to 24 years of age). But it may happen much later. It is similar to acne which is a disease of the sweat glands. This condition is slightly more common in African-Americans and women. SYMPTOMS   Hidradenitis usually starts as one or more red, tender, swellings in the groin or under the arms (axilla).  Over a period of hours to days the lesions get larger. They often open to the skin surface, draining clear to yellow-colored fluid.  The infected area heals with scarring. DIAGNOSIS  Your caregiver makes this diagnosis by looking at you. Sometimes cultures (growing germs on plates in the lab) may be taken. This is to see what germ (bacterium) is causing the infection.  TREATMENT   Topical germ killing medicine applied to the skin (antibiotics) are the treatment of choice. Antibiotics taken by mouth (systemic) are sometimes needed when the condition is getting worse or is severe.  Avoid tight-fitting clothing which traps moisture in.  Dirt does not cause hidradenitis and it is not caused by poor hygiene.  Involved areas should be cleaned daily using an antibacterial soap. Some patients find that the liquid form of Lever 2000, applied to the involved areas as a lotion after bathing, can help reduce the odor related to this condition.  Sometimes surgery is needed to drain infected areas or remove scarred tissue. Removal of  large amounts of tissue is used only in severe cases.  Birth control pills may be helpful.  Oral retinoids (vitamin A derivatives) for 6 to 12 months which are effective for acne may also help this condition.  Weight loss will improve but not cure hidradenitis. It is made worse by being overweight. But the condition is not caused by being overweight.  This condition is more common in people who have had acne.  It may become worse under stress. There is no medical cure for hidradenitis. It can be controlled, but not cured. The condition usually continues for years with periods of getting worse and getting better (remission). Document Released: 08/13/2003 Document Revised: 03/23/2011 Document Reviewed: 03/31/2013 [employer] Patient Information 2015 [employer]. This information is not intended to replace advice given to you by your health care provider. Make sure you discuss any questions you have with your health care provider.  

## 2013-09-30 NOTE — ED Notes (Signed)
Dr. Ward at bedside.

## 2013-10-12 NOTE — Consult Note (Signed)
NAME:  Kathleen Velasquez, Kathleen Velasquez                    ACCOUNT NO.:  MEDICAL RECORD NO.:  [id number]  LOCATION:                                 FACILITY:  PHYSICIAN:  Barbaraann Barthel, M.D. DATE OF BIRTH:  18-Jun-1990  DATE OF CONSULTATION:  10/09/2013 DATE OF DISCHARGE:                                CONSULTATION   NOTE:  This is a 23 year old white female who has had recurrent bouts of hidradenitis suppurativa.  She had previous episode in her right axilla, which required surgery in 2013.  She now presents with the left axilla inflamed.  She was seen earlier in the emergency room.  The cellulitis is almost resolved.  She has been placed on compresses and antibiotics have been reordered, and we will plan to take care of this via the Outpatient Department.  I discussed the surgery, treatment essentially as an excision and allowing this to heal by secondary intention.  We discussed this in detail, discussing complications, not limited to, but including bleeding, infection, recurrence.  Informed consent was obtained.  PAST SURGERY:  In 1991, left groin hernia, T and A done in 2008, gallbladder surgery in 2014, and in 2013, a right axillary hidradenitis suppurativa.  MEDICATIONS:  See medication list.  ALLERGIES:  She is allergic apparently to CIPRO, BACTRIM, PENICILLIN and PERCOCET.  SOCIAL HISTORY:  She does not use recreational drugs.  She does not abuse alcohol.  She smokes approximately 10 cigarettes per day.  PHYSICAL EXAMINATION:  VITAL SIGNS:  She is 5 feet and 4 inches, weighs 236 pounds, her temperature is 99.3, pulse is 64, respirations 14, blood pressure 122/60. HEENT:  Head is normocephalic.  Eyes; extraocular movements are intact. Pupils are round and reactive to light and accommodation.  There is no conjunctival pallor or scleral injection.  The sclera has a normal tincture.  No bruits are appreciated.  No thyromegaly.  The patient has a hereditary gum disease, problem with  poor dentition.  She has had to have gum surgery and she has lost many of her teeth. CHEST:  Clear, both anterior and posterior auscultation. HEART:  Regular rhythm.  Breasts are without masses.  Axilla, she has had a previous surgery on her right axilla and she has an area of similar involvement in the left axilla currently. EXTREMITIES:  Lower extremities, within normal limits. RECTAL:  Deferred. PELVIC:  Deferred.  REVIEW OF SYSTEMS:  NEURO SYSTEM:  No history of migraines or seizures. No lateralizing neurological findings.  ENDOCRINE SYSTEM:  No history of diabetes, thyroid disease or adrenal problems.  CARDIOPULMONARY SYSTEM: The patient does use tobacco.  MUSCULOSKELETAL SYSTEM:  The patient is obese.  OB/GYN HISTORY:  The patient takes a birth control device NuvaRing and she apparently has some anovulation problem.  She is a nulliparous patient.  She does not ovulate with any regularity.  GI SYSTEM:  No history of hepatitis, constipation, diarrhea, bright red rectal bleeding, melena or history of inflammatory bowel disease or irritable bowel syndrome, and no history of unexplained weight loss.  GU SYSTEM:  No history of frequency, dysuria, or kidney stones.  REVIEW OF HISTORY AND PHYSICAL:  Therefore, Kathleen Velasquez is  a 23 year old white female who will undergo an excision of left axillary hidradenitis suppurativa.  We will plan to do this via the Outpatient Department.  I am not going to attempt to put a skin graft or primary closure on this as that has failed on the contralateral side and we will leave this open and she is aware that this will require some wound care on her part and coordinate this with her family members.     Barbaraann BarthelWilliam Asaiah Hunnicutt, M.D.     WB/MEDQ  D:  10/09/2013  T:  10/10/2013  Job:  [id number]

## 2013-10-13 NOTE — Patient Instructions (Signed)
Kathleen HawthorneBrittany R Billing  10/13/2013   Your procedure is scheduled on:  10/19/13  [facility].  Call this number if you have problems the morning of surgery: [phone]   Remember:   Do not eat food or drink liquids after midnight.   Take these medicines the morning of surgery with A SIP OF WATER: BUSPAR, HYDROCODONE, ZOFRAN   Do not wear jewelry, make-up or nail polish.  Do not wear lotions, powders, or perfumes. You may wear deodorant.  Do not shave 48 hours prior to surgery. Men may shave face and neck.  Do not bring valuables to the hospital.  [facility] is not responsible for any belongings or valuables.               Contacts, dentures or bridgework may not be worn into surgery.  Leave suitcase in the car. After surgery it may be brought to your room.  For patients admitted to the hospital, discharge time is determined by your treatment team.               Patients discharged the day of surgery will not be allowed to drive home.  Name and phone number of your driver:   Special Instructions: Shower using CHG 2 nights before surgery and the night before surgery.  If you shower the day of surgery use CHG.  Use special wash - you have one bottle of CHG for all showers.  You should use approximately 1/3 of the bottle for each shower.   Please read over the following fact sheets that you were given: Surgical Site Infection Prevention and Anesthesia Post-op Instructions   PATIENT INSTRUCTIONS POST-ANESTHESIA  IMMEDIATELY FOLLOWING SURGERY:  Do not drive or operate machinery for the first twenty four hours after surgery.  Do not make any important decisions for twenty four hours after surgery or while taking narcotic pain medications or sedatives.  If you develop intractable nausea and vomiting or a severe headache please notify your doctor immediately.  FOLLOW-UP:  Please make an appointment with your surgeon as instructed. You do not need to follow up with anesthesia  unless specifically instructed to do so.  WOUND CARE INSTRUCTIONS (if applicable):  Keep a dry clean dressing on the anesthesia/puncture wound site if there is drainage.  Once the wound has quit draining you may leave it open to air.  Generally you should leave the bandage intact for twenty four hours unless there is drainage.  If the epidural site drains for more than 36-48 hours please call the anesthesia department.  QUESTIONS?:  Please feel free to call your physician or the hospital operator if you have any questions, and they will be happy to assist you.

## 2013-10-16 ENCOUNTER — Encounter (HOSPITAL_COMMUNITY): Payer: Self-pay

## 2013-10-16 ENCOUNTER — Encounter (HOSPITAL_COMMUNITY)
Admission: RE | Admit: 2013-10-16 | Discharge: 2013-10-16 | Disposition: A | Discharge status: Home / Self Care | Payer: BC Managed Care – PPO | Source: Ambulatory Visit | Attending: General Surgery | Admitting: General Surgery

## 2013-10-16 DIAGNOSIS — F17200 Nicotine dependence, unspecified, uncomplicated: Secondary | ICD-10-CM | POA: Diagnosis not present

## 2013-10-16 DIAGNOSIS — E669 Obesity, unspecified: Secondary | ICD-10-CM | POA: Diagnosis not present

## 2013-10-16 DIAGNOSIS — L732 Hidradenitis suppurativa: Secondary | ICD-10-CM | POA: Diagnosis present

## 2013-10-16 LAB — CBC WITH DIFFERENTIAL/PLATELET
BASOS PCT: 0 % (ref 0–1)
Basophils Absolute: 0 10*3/uL (ref 0.0–0.1)
Eosinophils Absolute: 0.2 10*3/uL (ref 0.0–0.7)
Eosinophils Relative: 2 % (ref 0–5)
HCT: 41.5 % (ref 36.0–46.0)
HEMOGLOBIN: 13.6 g/dL (ref 12.0–15.0)
Lymphocytes Relative: 34 % (ref 12–46)
Lymphs Abs: 3.3 10*3/uL (ref 0.7–4.0)
MCH: 28.4 pg (ref 26.0–34.0)
MCHC: 32.8 g/dL (ref 30.0–36.0)
MCV: 86.6 fL (ref 78.0–100.0)
MONOS PCT: 4 % (ref 3–12)
Monocytes Absolute: 0.4 10*3/uL (ref 0.1–1.0)
Neutro Abs: 5.7 10*3/uL (ref 1.7–7.7)
Neutrophils Relative %: 60 % (ref 43–77)
Platelets: 361 10*3/uL (ref 150–400)
RBC: 4.79 MIL/uL (ref 3.87–5.11)
RDW: 16.1 % — ABNORMAL HIGH (ref 11.5–15.5)
WBC: 9.8 10*3/uL (ref 4.0–10.5)

## 2013-10-16 LAB — HCG, SERUM, QUALITATIVE: PREG SERUM: NEGATIVE

## 2013-10-16 LAB — BASIC METABOLIC PANEL
Anion gap: 16 — ABNORMAL HIGH (ref 5–15)
BUN: 9 mg/dL (ref 6–23)
CHLORIDE: 104 meq/L (ref 96–112)
CO2: 19 mEq/L (ref 19–32)
Calcium: 9.1 mg/dL (ref 8.4–10.5)
Creatinine, Ser: 0.6 mg/dL (ref 0.50–1.10)
GFR calc non Af Amer: >90 mL/min (ref >90)
Glucose, Bld: 142 mg/dL — ABNORMAL HIGH (ref 70–99)
POTASSIUM: 4 meq/L (ref 3.7–5.3)
SODIUM: 139 meq/L (ref 137–147)

## 2013-10-16 LAB — SURGICAL PCR SCREEN
MRSA, PCR: NEGATIVE
Staphylococcus aureus: NEGATIVE

## 2013-10-19 ENCOUNTER — Encounter (HOSPITAL_COMMUNITY): Payer: Self-pay | Admitting: *Deleted

## 2013-10-19 ENCOUNTER — Ambulatory Visit (HOSPITAL_COMMUNITY): Payer: BC Managed Care – PPO | Admitting: Anesthesiology

## 2013-10-19 ENCOUNTER — Ambulatory Visit (HOSPITAL_COMMUNITY)
Admission: RE | Admit: 2013-10-19 | Discharge: 2013-10-19 | Disposition: A | Discharge status: Home / Self Care | Payer: BC Managed Care – PPO | Source: Ambulatory Visit | Attending: General Surgery | Admitting: General Surgery

## 2013-10-19 ENCOUNTER — Encounter (HOSPITAL_COMMUNITY): Payer: BC Managed Care – PPO | Admitting: Anesthesiology

## 2013-10-19 ENCOUNTER — Encounter (HOSPITAL_COMMUNITY)
Admission: RE | Disposition: A | Discharge status: Home / Self Care | Payer: Self-pay | Source: Ambulatory Visit | Attending: General Surgery

## 2013-10-19 DIAGNOSIS — E669 Obesity, unspecified: Secondary | ICD-10-CM | POA: Insufficient documentation

## 2013-10-19 DIAGNOSIS — F17200 Nicotine dependence, unspecified, uncomplicated: Secondary | ICD-10-CM | POA: Insufficient documentation

## 2013-10-19 DIAGNOSIS — L732 Hidradenitis suppurativa: Secondary | ICD-10-CM | POA: Insufficient documentation

## 2013-10-19 DIAGNOSIS — M79602 Pain in left arm: Secondary | ICD-10-CM

## 2013-10-19 DIAGNOSIS — N938 Other specified abnormal uterine and vaginal bleeding: Secondary | ICD-10-CM

## 2013-10-19 DIAGNOSIS — N970 Female infertility associated with anovulation: Secondary | ICD-10-CM

## 2013-10-19 HISTORY — PX: HYDRADENITIS EXCISION: SHX5243

## 2013-10-19 LAB — GLUCOSE, CAPILLARY: Glucose-Capillary: 104 mg/dL — ABNORMAL HIGH (ref 70–99)

## 2013-10-19 SURGERY — EXCISION, HIDRADENITIS, AXILLA
Anesthesia: General | Laterality: Left

## 2013-10-19 MED ORDER — MIDAZOLAM HCL 2 MG/2ML IJ SOLN
INTRAMUSCULAR | Status: AC
  Filled 2013-10-19: qty 2

## 2013-10-19 MED ORDER — ROCURONIUM BROMIDE 100 MG/10ML IV SOLN
INTRAVENOUS | Status: DC | PRN
  Administered 2013-10-19: 5 mg via INTRAVENOUS
  Administered 2013-10-19: 20 mg via INTRAVENOUS

## 2013-10-19 MED ORDER — FENTANYL CITRATE 0.05 MG/ML IJ SOLN
INTRAMUSCULAR | Status: DC | PRN
  Administered 2013-10-19 (×5): 50 ug via INTRAVENOUS

## 2013-10-19 MED ORDER — ONDANSETRON HCL 4 MG/2ML IJ SOLN
INTRAMUSCULAR | Status: AC
  Filled 2013-10-19: qty 2

## 2013-10-19 MED ORDER — PROPOFOL 10 MG/ML IV EMUL
INTRAVENOUS | Status: AC
  Filled 2013-10-19: qty 20

## 2013-10-19 MED ORDER — GLYCOPYRROLATE 0.2 MG/ML IJ SOLN
INTRAMUSCULAR | Status: AC
  Filled 2013-10-19: qty 3

## 2013-10-19 MED ORDER — NEOSTIGMINE METHYLSULFATE 10 MG/10ML IV SOLN
INTRAVENOUS | Status: DC | PRN
  Administered 2013-10-19: 3 mg via INTRAVENOUS

## 2013-10-19 MED ORDER — LACTATED RINGERS IV SOLN
INTRAVENOUS | Status: DC
  Administered 2013-10-19: 1000 mL via INTRAVENOUS

## 2013-10-19 MED ORDER — LIDOCAINE HCL (CARDIAC) 20 MG/ML IV SOLN
INTRAVENOUS | Status: DC | PRN
  Administered 2013-10-19: 50 mg via INTRAVENOUS

## 2013-10-19 MED ORDER — 0.9 % SODIUM CHLORIDE (POUR BTL) OPTIME
TOPICAL | Status: DC | PRN
  Administered 2013-10-19: 1000 mL

## 2013-10-19 MED ORDER — BUPIVACAINE HCL (PF) 0.5 % IJ SOLN
INTRAMUSCULAR | Status: AC
  Filled 2013-10-19: qty 30

## 2013-10-19 MED ORDER — FENTANYL CITRATE 0.05 MG/ML IJ SOLN
INTRAMUSCULAR | Status: AC
  Filled 2013-10-19: qty 5

## 2013-10-19 MED ORDER — DEXAMETHASONE SODIUM PHOSPHATE 4 MG/ML IJ SOLN
INTRAMUSCULAR | Status: AC
  Filled 2013-10-19: qty 1

## 2013-10-19 MED ORDER — NEOSTIGMINE METHYLSULFATE 10 MG/10ML IV SOLN
INTRAVENOUS | Status: AC
  Filled 2013-10-19: qty 2

## 2013-10-19 MED ORDER — DEXTROSE 5 % IV SOLN
100.0000 mg | Freq: Once | INTRAVENOUS | Status: AC
  Administered 2013-10-19: 100 mg via INTRAVENOUS
  Filled 2013-10-19: qty 100

## 2013-10-19 MED ORDER — SUCCINYLCHOLINE CHLORIDE 20 MG/ML IJ SOLN
INTRAMUSCULAR | Status: DC | PRN
  Administered 2013-10-19: 140 mg via INTRAVENOUS

## 2013-10-19 MED ORDER — ONDANSETRON HCL 4 MG/2ML IJ SOLN
4.0000 mg | Freq: Once | INTRAMUSCULAR | Status: AC
  Administered 2013-10-19: 4 mg via INTRAVENOUS

## 2013-10-19 MED ORDER — SUCCINYLCHOLINE CHLORIDE 20 MG/ML IJ SOLN
INTRAMUSCULAR | Status: AC
  Filled 2013-10-19: qty 2

## 2013-10-19 MED ORDER — ROCURONIUM BROMIDE 50 MG/5ML IV SOLN
INTRAVENOUS | Status: AC
  Filled 2013-10-19: qty 2

## 2013-10-19 MED ORDER — DOXYCYCLINE HYCLATE 100 MG IV SOLR
INTRAVENOUS | Status: AC
  Filled 2013-10-19: qty 100

## 2013-10-19 MED ORDER — ONDANSETRON HCL 4 MG/2ML IJ SOLN: 4.0000 mg | Freq: Once | INTRAMUSCULAR | Status: DC | PRN

## 2013-10-19 MED ORDER — DEXAMETHASONE SODIUM PHOSPHATE 4 MG/ML IJ SOLN
4.0000 mg | Freq: Once | INTRAMUSCULAR | Status: AC
  Administered 2013-10-19: 4 mg via INTRAVENOUS

## 2013-10-19 MED ORDER — PROPOFOL 10 MG/ML IV BOLUS
INTRAVENOUS | Status: DC | PRN
  Administered 2013-10-19: 200 mg via INTRAVENOUS

## 2013-10-19 MED ORDER — MIDAZOLAM HCL 2 MG/2ML IJ SOLN
1.0000 mg | INTRAMUSCULAR | Status: DC | PRN
  Administered 2013-10-19 (×2): 2 mg via INTRAVENOUS
  Filled 2013-10-19: qty 2

## 2013-10-19 MED ORDER — GLYCOPYRROLATE 0.2 MG/ML IJ SOLN
INTRAMUSCULAR | Status: DC | PRN
  Administered 2013-10-19: .5 mg via INTRAVENOUS

## 2013-10-19 MED ORDER — LIDOCAINE HCL (PF) 1 % IJ SOLN
INTRAMUSCULAR | Status: AC
  Filled 2013-10-19: qty 10

## 2013-10-19 MED ORDER — FENTANYL CITRATE 0.05 MG/ML IJ SOLN
25.0000 ug | INTRAMUSCULAR | Status: DC | PRN
  Administered 2013-10-19 (×2): 25 ug via INTRAVENOUS
  Filled 2013-10-19: qty 2

## 2013-10-19 SURGICAL SUPPLY — 53 items
APPLIER CLIP 11 MED OPEN (CLIP)
APPLIER CLIP 9.375 SM OPEN (CLIP)
ATTRACTOMAT 16X20 MAGNETIC DRP (DRAPES) ×2 IMPLANT
BAG HAMPER (MISCELLANEOUS) ×2 IMPLANT
BANDAGE CONFORM 2  STR LF (GAUZE/BANDAGES/DRESSINGS) ×2 IMPLANT
BLADE SURG 15 STRL LF DISP TIS (BLADE) ×2 IMPLANT
BLADE SURG 15 STRL SS (BLADE) ×2
BLADE SURG SZ10 CARB STEEL (BLADE) ×2 IMPLANT
BLADE SURG SZ20 CARB STEEL (BLADE) IMPLANT
BNDG GAUZE ELAST 4 BULKY (GAUZE/BANDAGES/DRESSINGS) IMPLANT
CLIP APPLIE 11 MED OPEN (CLIP) IMPLANT
CLIP APPLIE 9.375 SM OPEN (CLIP) IMPLANT
CLOTH BEACON ORANGE TIMEOUT ST (SAFETY) ×2 IMPLANT
COVER LIGHT HANDLE STERIS (MISCELLANEOUS) ×4 IMPLANT
DEPRESSOR TONGUE BLADE STERILE (MISCELLANEOUS) IMPLANT
DRAPE PROXIMA HALF (DRAPES) ×6 IMPLANT
DRAPE UTILITY W/TAPE 26X15 (DRAPES) ×4 IMPLANT
DRSG TEGADERM 4X10 (GAUZE/BANDAGES/DRESSINGS) IMPLANT
ELECT REM PT RETURN 9FT ADLT (ELECTROSURGICAL) ×2
ELECTRODE REM PT RTRN 9FT ADLT (ELECTROSURGICAL) ×1 IMPLANT
FORMALIN 10 PREFIL 480ML (MISCELLANEOUS) ×2 IMPLANT
GAUZE SPONGE 4X4 12PLY STRL (GAUZE/BANDAGES/DRESSINGS) IMPLANT
GLOVE BIO SURGEON STRL SZ 6.5 (GLOVE) ×4 IMPLANT
GLOVE BIOGEL PI IND STRL 7.0 (GLOVE) ×2 IMPLANT
GLOVE BIOGEL PI IND STRL 7.5 (GLOVE) ×1 IMPLANT
GLOVE BIOGEL PI INDICATOR 7.0 (GLOVE) ×2
GLOVE BIOGEL PI INDICATOR 7.5 (GLOVE) ×1
GLOVE SKINSENSE NS SZ7.0 (GLOVE) ×1
GLOVE SKINSENSE STRL SZ7.0 (GLOVE) ×1 IMPLANT
GOWN STRL REUS W/TWL LRG LVL3 (GOWN DISPOSABLE) ×6 IMPLANT
INST SET MAJOR GENERAL (KITS) ×2 IMPLANT
KIT ROOM TURNOVER APOR (KITS) ×2 IMPLANT
MANIFOLD NEPTUNE II (INSTRUMENTS) ×2 IMPLANT
MARKER SKIN DUAL TIP RULER LAB (MISCELLANEOUS) ×2 IMPLANT
NEEDLE HYPO 25X1 1.5 SAFETY (NEEDLE) IMPLANT
PACK MINOR (CUSTOM PROCEDURE TRAY) ×2 IMPLANT
PAD ABD 5X9 TENDERSORB (GAUZE/BANDAGES/DRESSINGS) ×2 IMPLANT
PAD ARMBOARD 7.5X6 YLW CONV (MISCELLANEOUS) ×2 IMPLANT
SET BASIN LINEN APH (SET/KITS/TRAYS/PACK) ×2 IMPLANT
SOL PREP PROV IODINE SCRUB 4OZ (MISCELLANEOUS) ×2 IMPLANT
SPONGE LAP 18X18 X RAY DECT (DISPOSABLE) ×4 IMPLANT
STAPLER VISISTAT (STAPLE) IMPLANT
STOCKINETTE IMPERVIOUS LG (DRAPES) ×2 IMPLANT
SUT ETHILON 3 0 FSL (SUTURE) IMPLANT
SUT SILK 2 0 (SUTURE) ×1
SUT SILK 2-0 18XBRD TIE 12 (SUTURE) ×1 IMPLANT
SUT VIC AB 3-0 SH 27 (SUTURE) ×1
SUT VIC AB 3-0 SH 27X BRD (SUTURE) ×1 IMPLANT
SUT VICRYL AB 2 0 TIES (SUTURE) ×2 IMPLANT
SYR BULB IRRIGATION 50ML (SYRINGE) ×2 IMPLANT
TAPE CLOTH SURG 4X10 WHT LF (GAUZE/BANDAGES/DRESSINGS) ×2 IMPLANT
TOWEL OR 17X26 4PK STRL BLUE (TOWEL DISPOSABLE) ×2 IMPLANT
WATER STERILE IRR 1000ML POUR (IV SOLUTION) ×4 IMPLANT

## 2013-10-19 NOTE — Transfer of Care (Signed)
Immediate Anesthesia Transfer of Care Note  Patient: Kathleen HawthorneBrittany R Velasquez  Procedure(s) Performed: Procedure(s): INCISION OF HIDRADENITIS SUPRATIVA LEFT AXILLA (Left)  Patient Location: PACU  Anesthesia Type:General  Level of Consciousness: awake and alert   Airway & Oxygen Therapy: Patient Spontanous Breathing and Patient connected to face mask oxygen  Post-op Assessment: Report given to PACU RN  Post vital signs: Reviewed and stable  Complications: No apparent anesthesia complications

## 2013-10-19 NOTE — Anesthesia Postprocedure Evaluation (Signed)
  Anesthesia Post-op Note  Patient: Kathleen Velasquez  Procedure(s) Performed: Procedure(s): INCISION OF HIDRADENITIS SUPRATIVA LEFT AXILLA (Left)  Patient Location: PACU  Anesthesia Type:General  Level of Consciousness: awake and alert   Airway and Oxygen Therapy: Patient Spontanous Breathing and Patient connected to face mask oxygen  Post-op Pain: moderate  Post-op Assessment: Post-op Vital signs reviewed, Patient's Cardiovascular Status Stable, Respiratory Function Stable, Patent Airway and No signs of Nausea or vomiting  Post-op Vital Signs: Reviewed and stable  Last Vitals:  Filed Vitals:   10/19/13 0715  BP: 116/59  Pulse:   Temp:   Resp: 16    Complications: No apparent anesthesia complications

## 2013-10-19 NOTE — Op Note (Signed)
NAMRaynelle Dick:  Velasquez, Kathleen               ACCOUNT NO.:  [id number]  MEDICAL RECORD NO.:  [id number]  LOCATION:  [facility]                          FACILITY:  [facility]  PHYSICIAN:  Barbaraann BarthelWilliam Anzlee Hinesley, M.D. DATE OF BIRTH:  May 12, 1990  DATE OF PROCEDURE:  10/19/2013 DATE OF DISCHARGE:                              OPERATIVE REPORT   SURGEON:  Barbaraann BarthelWilliam Derold Dorsch, M.D.  PREOPERATIVE DIAGNOSIS:  Recurrent hidradenitis suppurativa, left axilla.  POSTOPERATIVE DIAGNOSIS:  Recurrent hidradenitis suppurativa, left axilla.  PROCEDURE:  Excision of hidradenitis suppurativa, left axilla.  WOUND CLASSIFICATION:  Contaminated or infected.  NOTE:  This is a 23 year old obese white female who had recurrent episodes of hidradenitis suppurativa.  She had undergone a similar excision of hidradenitis suppurativa in 2013 and she presented to the emergency room with a recurrence and was referred to my office after being placed on antibiotics.  I saw her.  We planned for an elective excision, and she was told to continue the antibiotics on which she was placed.  We then made plans to bring her via the outpatient department for an excision procedure.  I did not want to try a primary closure as this had failed on the previous operative attempt.  GROSS OPERATIVE FINDINGS:  Those consistent with a chronic inflammation tissue.  TECHNIQUE:  The patient was placed in the supine position.  A limb isolator was placed on her left arm so we were able to manipulate the arm.  We then prepped with Betadine solution and draped in the usual manner.  An elliptical incision was carried out over the area that was infected with the hidradenitis suppurativa.  We unroofed the sinus tracts that came from another focus in the skin, and then after removing all grossly inflamed tissue, we controlled the hemostasis with a cautery device, and I packed the wound open with dilute Betadine soaked roll gauze.  In both areas, there was a smaller  incision and a larger incision in the left axilla, which were packed nicely. We then checked for hemostasis and then applied a sterile dressing. Prior to closure, all sponge, needle, and instrument counts were found to be correct.  Estimated blood loss was less than 25 mL.  The patient received about 600 mL of crystalloids intraoperatively.  There were no complications.     Barbaraann BarthelWilliam Nicoles Sedlacek, M.D.     WB/MEDQ  D:  10/19/2013  T:  10/19/2013  Job:  [id number]  cc:   Emergency Room

## 2013-10-19 NOTE — Brief Op Note (Signed)
10/19/2013  8:41 AM  PATIENT:  Kathleen Velasquez  23 y.o. female  PRE-OPERATIVE DIAGNOSIS:  left axilla hidradenitis  POST-OPERATIVE DIAGNOSIS:  left axilla  hidradenitis suprativa  PROCEDURE:  Procedure(s): INCISION OF HIDRADENITIS SUPRATIVA LEFT AXILLA (Left)  SURGEON:  Surgeon(s) and Role:    * Marlane HatcherWilliam S San Lohmeyer, MD - Primary  PHYSICIAN ASSISTANT:   ASSISTANTS: none   ANESTHESIA:   general  EBL:  Total I/O In: 500 [I.V.:500] Out: 0   BLOOD ADMINISTERED:none  DRAINS: none   LOCAL MEDICATIONS USED:  NONE  SPECIMEN:  Source of Specimen:  adenitis supprativa left axilla.  DISPOSITION OF SPECIMEN:  PATHOLOGY  COUNTS:  YES  TOURNIQUET:  * No tourniquets in log *  DICTATION: .Other Dictation: Dictation Number OR dict. # [id number]  PLAN OF CARE: Discharge to home after PACU  PATIENT DISPOSITION:  PACU - hemodynamically stable.   Delay start of Pharmacological VTE agent (>24hrs) due to surgical blood loss or risk of bleeding: not applicable

## 2013-10-19 NOTE — Anesthesia Preprocedure Evaluation (Addendum)
Anesthesia Evaluation  Patient identified by MRN, date of birth, ID band Patient awake    Reviewed: Allergy & Precautions, H&P , NPO status , Patient's Chart, lab work & pertinent test results  History of Anesthesia Complications Negative for: history of anesthetic complications  Airway Mallampati: II TM Distance: >3 FB Neck ROM: Full    Dental  (+) Poor Dentition, Chipped, Missing, Dental Advisory Given   Pulmonary Current Smoker,  breath sounds clear to auscultation        Cardiovascular negative cardio ROS  Rhythm:Regular Rate:Normal     Neuro/Psych    GI/Hepatic negative GI ROS, RUQ pain, nausea   Endo/Other  Morbid obesity  Renal/GU      Musculoskeletal   Abdominal   Peds  Hematology   Anesthesia Other Findings   Reproductive/Obstetrics                          Anesthesia Physical Anesthesia Plan  ASA: II  Anesthesia Plan: General   Post-op Pain Management:    Induction: Intravenous  Airway Management Planned: Oral ETT  Additional Equipment:   Intra-op Plan:   Post-operative Plan: Extubation in OR  Informed Consent: I have reviewed the patients History and Physical, chart, labs and discussed the procedure including the risks, benefits and alternatives for the proposed anesthesia with the patient or authorized representative who has indicated his/her understanding and acceptance.     Plan Discussed with:   Anesthesia Plan Comments:         Anesthesia Quick Evaluation

## 2013-10-19 NOTE — Progress Notes (Signed)
23 yr. Old W. Female with recurrent hydradenitis suppurativa of left axilla for excision.   I will leave this to heal by secondary intention.   Procedure and risk explained and informed consent obtained.  Pt allergic to various antibiotics, will give IV vibramyciNo clinical change in H&P, dict [id number].  Filed Vitals:   10/19/13 0715  BP: 116/59  Pulse:   Temp:   Resp: 16  temp 98.3, pulse  73

## 2013-10-19 NOTE — Discharge Instructions (Signed)
Incision Care °An incision is when a surgeon cuts into your body tissues. After surgery, the incision needs to be cared for properly to prevent infection.  °HOME CARE INSTRUCTIONS  °· Take all medicine as directed by your caregiver. Only take over-the-counter or prescription medicines for pain, discomfort, or fever as directed by your caregiver. °· Do not remove your bandage (dressing) or get your incision wet until your surgeon gives you permission. In the event that your dressing becomes wet, dirty, or starts to smell, change the dressing and call your surgeon for instructions as soon as possible. °· Take showers. Do not take tub baths, swim, or do anything that may soak the wound until it is healed. °· Resume your normal diet and activities as directed or allowed. °· Avoid lifting any weight until you are instructed otherwise. °· Use anti-itch antihistamine medicine as directed by your caregiver. The wound may itch when it is healing. Do not pick or scratch at the wound. °· Follow up with your caregiver for stitch (suture) or staple removal as directed. °· Drink enough fluids to keep your urine clear or pale yellow. °SEEK MEDICAL CARE IF:  °· You have redness, swelling, or increasing pain in the wound that is not controlled with medicine. °· You have drainage, blood, or pus coming from the wound that lasts longer than 1 day. °· You develop muscle aches, chills, or a general ill feeling. °· You notice a bad smell coming from the wound or dressing. °· Your wound edges separate after the sutures, staples, or skin adhesive strips have been removed. °· You develop persistent nausea or vomiting. °SEEK IMMEDIATE MEDICAL CARE IF:  °· You have a fever. °· You develop a rash. °· You develop dizzy episodes or faint while standing. °· You have difficulty breathing. °· You develop any reaction or side effects to medicine given. °MAKE SURE YOU:  °· Understand these instructions. °· Will watch your condition. °· Will get help  right away if you are not doing well or get worse. °Document Released: 07/18/2004 Document Revised: 03/23/2011 Document Reviewed: 02/22/2013 °[employer]® Patient Information ©2015 [employer]. This information is not intended to replace advice given to you by your health care provider. Make sure you discuss any questions you have with your health care provider. ° ° °

## 2013-10-19 NOTE — Anesthesia Procedure Notes (Signed)
Procedure Name: Intubation Date/Time: 10/19/2013 7:49 AM Performed by: Glynn OctaveANIEL, Porchea Charrier E Pre-anesthesia Checklist: Patient identified, Patient being monitored, Timeout performed, Emergency Drugs available and Suction available Patient Re-evaluated:Patient Re-evaluated prior to inductionOxygen Delivery Method: Circle System Utilized Preoxygenation: Pre-oxygenation with 100% oxygen Intubation Type: IV induction Ventilation: Mask ventilation without difficulty Laryngoscope Size: Mac and 3 Grade View: Grade II Tube type: Oral Tube size: 7.0 mm Number of attempts: 1 Airway Equipment and Method: stylet Placement Confirmation: ETT inserted through vocal cords under direct vision,  positive ETCO2 and breath sounds checked- equal and bilateral Secured at: 21 cm Tube secured with: Tape Dental Injury: Teeth and Oropharynx as per pre-operative assessment

## 2013-10-20 ENCOUNTER — Encounter (HOSPITAL_COMMUNITY): Payer: Self-pay | Admitting: General Surgery

## 2013-11-01 ENCOUNTER — Emergency Department (HOSPITAL_COMMUNITY)
Admission: EM | Admit: 2013-11-01 | Discharge: 2013-11-01 | Disposition: A | Discharge status: Home / Self Care | Payer: BC Managed Care – PPO | Attending: Emergency Medicine | Admitting: Emergency Medicine

## 2013-11-01 ENCOUNTER — Encounter (HOSPITAL_COMMUNITY): Payer: Self-pay | Admitting: Emergency Medicine

## 2013-11-01 DIAGNOSIS — Z8742 Personal history of other diseases of the female genital tract: Secondary | ICD-10-CM | POA: Diagnosis not present

## 2013-11-01 DIAGNOSIS — Z79899 Other long term (current) drug therapy: Secondary | ICD-10-CM | POA: Diagnosis not present

## 2013-11-01 DIAGNOSIS — R6883 Chills (without fever): Secondary | ICD-10-CM | POA: Diagnosis not present

## 2013-11-01 DIAGNOSIS — R1110 Vomiting, unspecified: Secondary | ICD-10-CM | POA: Diagnosis not present

## 2013-11-01 DIAGNOSIS — Z720 Tobacco use: Secondary | ICD-10-CM | POA: Diagnosis not present

## 2013-11-01 DIAGNOSIS — Z880 Allergy status to penicillin: Secondary | ICD-10-CM | POA: Diagnosis not present

## 2013-11-01 DIAGNOSIS — L732 Hidradenitis suppurativa: Secondary | ICD-10-CM | POA: Diagnosis not present

## 2013-11-01 MED ORDER — ONDANSETRON HCL 4 MG PO TABS: 4.0000 mg | ORAL_TABLET | Freq: Four times a day (QID) | ORAL | Status: DC

## 2013-11-01 MED ORDER — DOXYCYCLINE HYCLATE 100 MG PO CAPS: 100.0000 mg | ORAL_CAPSULE | Freq: Two times a day (BID) | ORAL | Status: DC

## 2013-11-01 MED ORDER — HYDROCODONE-ACETAMINOPHEN 5-325 MG PO TABS: 2.0000 | ORAL_TABLET | ORAL | Status: DC | PRN

## 2013-11-01 NOTE — ED Notes (Signed)
Clean dressing was put over the wound

## 2013-11-01 NOTE — ED Provider Notes (Signed)
CSN: [id number]     Arrival date & time 11/01/13  1614 History   First MD Initiated Contact with Patient 11/01/13 1620     Chief Complaint  Patient presents with  . Emesis     (Consider location/radiation/quality/duration/timing/severity/associated sxs/prior Treatment) HPI Comments: Patient presents to the ER with concerns of her postoperative infection. Patient reports that she had excision of hidradenitis per kilo of the left axilla on October 8. Patient reports that she was seen 5 days ago in the office and was told that there was some infection in the region, but was not prescribed antibiotics. Yesterday she developed generalized weakness, not feeling well. She started with it may be because of the infection and started doxycycline that she had left over from a previous prescription yesterday. This morning she had onset of nausea and vomiting after taking a second dose of doxycycline. Patient reports feeling hot, cold, chills. There is a small amount of drainage from the site.  Patient is a 23 y.o. female presenting with vomiting.  Emesis Associated symptoms: chills     Past Medical History  Diagnosis Date  . Abscess     right buttocks/thigh  . DUB (dysfunctional uterine bleeding) 05/25/2012   Past Surgical History  Procedure Laterality Date  . Hernia repair    . Tonsillectomy    . Adenoidectomy    . Hydradenitis excision  10/02/2011    Procedure: EXCISION HYDRADENITIS AXILLA;  Surgeon: Fabio BeringBrent C Ziegler, MD;  Location: [facility];  Service: General;  Laterality: Right;  Right Axillary Dissection  . Cholecystectomy  01/27/2012    Procedure: LAPAROSCOPIC CHOLECYSTECTOMY;  Surgeon: Fabio BeringBrent C Ziegler, MD;  Location: [facility];  Service: General;  Laterality: N/A;  . Hydradenitis excision Left 10/19/2013    Procedure: INCISION OF HIDRADENITIS SUPPURATIVA LEFT AXILLA;  Surgeon: Marlane HatcherWilliam S Bradford, MD;  Location: [facility];  Service: General;  Laterality: Left;   Family History  Problem Relation Age  of Onset  . Diabetes Mother   . Hypertension Mother   . Hyperlipidemia Mother   . Neuropathy Mother   . Diabetes Maternal Grandmother   . Hypertension Maternal Grandmother   . Heart disease Maternal Grandmother     CHF  . COPD Maternal Grandmother   . Cirrhosis Maternal Grandmother   . Cancer Maternal Grandfather     lung  . Diabetes Paternal Grandmother   . Diabetes Paternal Grandfather   . Aneurysm Paternal Grandfather   . Stroke Other    History  Substance Use Topics  . Smoking status: Current Every Day Smoker -- 0.50 packs/day for 5 years    Types: Cigarettes  . Smokeless tobacco: Never Used  . Alcohol Use: Yes     Comment: social use   OB History   Grav Para Term Preterm Abortions TAB SAB Ect Mult Living   0              Review of Systems  Constitutional: Positive for chills.  Gastrointestinal: Positive for vomiting.  Skin: Positive for wound.  All other systems reviewed and are negative.     Allergies  Bactrim; Ciprofloxacin; Other; Penicillins; and Percocet  Home Medications   Prior to Admission medications   Medication Sig Start Date End Date Taking? Authorizing Provider  acetaminophen (TYLENOL) 500 MG tablet Take 1,000 mg by mouth every 6 (six) hours as needed for moderate pain.    Historical Provider, MD  busPIRone (BUSPAR) 15 MG tablet Take 1 tablet by mouth 2 (two) times daily. 07/31/13  Historical Provider, MD  clindamycin (CLEOCIN) 300 MG capsule Take 1 capsule (300 mg total) by mouth every 6 (six) hours. 09/30/13   Layla MawKristen N Ward, DO  etonogestrel-ethinyl estradiol (NUVARING) 0.12-0.015 MG/24HR vaginal ring Insert vaginally and leave in place for 3 consecutive weeks, then remove for 1 week. 09/27/13   Peggy Constant, MD  HYDROcodone-acetaminophen (NORCO/VICODIN) 5-325 MG per tablet Take 1 tablet by mouth every 4 (four) hours as needed. 09/30/13   Kristen N Ward, DO   BP 115/76  Pulse 78  Temp(Src) 98.5 F (36.9 C) (Oral)  Resp 18  Ht 5\' 4"  (1.626  m)  Wt 236 lb (107.049 kg)  BMI 40.49 kg/m2  SpO2 100%  LMP 09/27/2013 Physical Exam  Constitutional: She is oriented to person, place, and time. She appears well-developed and well-nourished. No distress.  HENT:  Head: Normocephalic and atraumatic.  Right Ear: Hearing normal.  Left Ear: Hearing normal.  Nose: Nose normal.  Mouth/Throat: Oropharynx is clear and moist and mucous membranes are normal.  Eyes: Conjunctivae and EOM are normal. Pupils are equal, round, and reactive to light.  Neck: Normal range of motion. Neck supple.  Cardiovascular: Regular rhythm, S1 normal and S2 normal.  Exam reveals no gallop and no friction rub.   No murmur heard. Pulmonary/Chest: Effort normal and breath sounds normal. No respiratory distress. She exhibits no tenderness.  Abdominal: Soft. Normal appearance and bowel sounds are normal. There is no hepatosplenomegaly. There is no tenderness. There is no rebound, no guarding, no tenderness at McBurney's point and negative Murphy's sign. No hernia.  Musculoskeletal: Normal range of motion.  Neurological: She is alert and oriented to person, place, and time. She has normal strength. No cranial nerve deficit or sensory deficit. Coordination normal. GCS eye subscore is 4. GCS verbal subscore is 5. GCS motor subscore is 6.  Skin: Skin is warm, dry and intact. No rash noted. No cyanosis.  open incision site left axilla without any erythema, induration. There is no drainage.  Psychiatric: She has a normal mood and affect. Her speech is normal and behavior is normal. Thought content normal.    ED Course  Procedures (including critical care time) Labs Review Labs Reviewed - No data to display  Imaging Review No results found.   EKG Interpretation None      MDM   Final diagnoses:  None   nausea and vomiting  s/p hidradenitis surgery  Patient presents to the ER for evaluation of nausea, vomiting, generalized weakness. Patient reports recent excision  of hidradenitis support view of the left axilla. She is concerned about infection. She did start taking doxycycline yesterday. She had nausea and vomiting after taking the antibiotic, it might be related to the antibiotic. The wound actually looks like it is healing well. There is no drainage currently, the patient reports that she irrigated it with saline just prior to coming to the ER. It is reasonable to continue the course of antibiotics. Symptomatic treatment with Zofran, pain medication. Followup with Doctor Malvin JohnsBradford.    Gilda Creasehristopher J. Khoury Siemon, MD 11/01/13 [phone]

## 2013-11-01 NOTE — ED Notes (Signed)
Pt reports had excision of hidradenitis under left arm Oct 8th.  Saw Dr. Malvin JohnsBradford on the 16 and was told she had an infection in the wound but he did not prescribe any antibiotics.  Pt was upset with him for not prescribing any antibiotics so she took some doxycycline that she had left over.   Reports took 1st dose yesterday morning and again around 10pm last night.  Pt says around 1045 became nauseated and started vomiting.  Pt says this morning woke up with dizziness and headache, reports hot flashes and fatigue.

## 2013-11-01 NOTE — Discharge Instructions (Signed)
Nausea and Vomiting °Nausea is a sick feeling that often comes before throwing up (vomiting). Vomiting is a reflex where stomach contents come out of your mouth. Vomiting can cause severe loss of body fluids (dehydration). Children and elderly adults can become dehydrated quickly, especially if they also have diarrhea. Nausea and vomiting are symptoms of a condition or disease. It is important to find the cause of your symptoms. °CAUSES  °· Direct irritation of the stomach lining. This irritation can result from increased acid production (gastroesophageal reflux disease), infection, food poisoning, taking certain medicines (such as nonsteroidal anti-inflammatory drugs), alcohol use, or tobacco use. °· Signals from the brain. These signals could be caused by a headache, heat exposure, an inner ear disturbance, increased pressure in the brain from injury, infection, a tumor, or a concussion, pain, emotional stimulus, or metabolic problems. °· An obstruction in the gastrointestinal tract (bowel obstruction). °· Illnesses such as diabetes, hepatitis, gallbladder problems, appendicitis, kidney problems, cancer, sepsis, atypical symptoms of a heart attack, or eating disorders. °· Medical treatments such as chemotherapy and radiation. °· Receiving medicine that makes you sleep (general anesthetic) during surgery. °DIAGNOSIS °Your caregiver may ask for tests to be done if the problems do not improve after a few days. Tests may also be done if symptoms are severe or if the reason for the nausea and vomiting is not clear. Tests may include: °· Urine tests. °· Blood tests. °· Stool tests. °· Cultures (to look for evidence of infection). °· X-rays or other imaging studies. °Test results can help your caregiver make decisions about treatment or the need for additional tests. °TREATMENT °You need to stay well hydrated. Drink frequently but in small amounts. You may wish to drink water, sports drinks, clear broth, or eat frozen  ice pops or gelatin dessert to help stay hydrated. When you eat, eating slowly may help prevent nausea. There are also some antinausea medicines that may help prevent nausea. °HOME CARE INSTRUCTIONS  °· Take all medicine as directed by your caregiver. °· If you do not have an appetite, do not force yourself to eat. However, you must continue to drink fluids. °· If you have an appetite, eat a normal diet unless your caregiver tells you differently. °¨ Eat a variety of complex carbohydrates (rice, wheat, potatoes, bread), lean meats, yogurt, fruits, and vegetables. °¨ Avoid high-fat foods because they are more difficult to digest. °· Drink enough water and fluids to keep your urine clear or pale yellow. °· If you are dehydrated, ask your caregiver for specific rehydration instructions. Signs of dehydration may include: °¨ Severe thirst. °¨ Dry lips and mouth. °¨ Dizziness. °¨ Dark urine. °¨ Decreasing urine frequency and amount. °¨ Confusion. °¨ Rapid breathing or pulse. °SEEK IMMEDIATE MEDICAL CARE IF:  °· You have blood or brown flecks (like coffee grounds) in your vomit. °· You have black or bloody stools. °· You have a severe headache or stiff neck. °· You are confused. °· You have severe abdominal pain. °· You have chest pain or trouble breathing. °· You do not urinate at least once every 8 hours. °· You develop cold or clammy skin. °· You continue to vomit for longer than 24 to 48 hours. °· You have a fever. °MAKE SURE YOU:  °· Understand these instructions. °· Will watch your condition. °· Will get help right away if you are not doing well or get worse. °Document Released: 12/29/2004 Document Revised: 03/23/2011 Document Reviewed: 05/28/2010 °ExitCare® Patient Information ©2015 [employer]. This information is not intended   to replace advice given to you by your health care provider. Make sure you discuss any questions you have with your health care provider.  Hidradenitis Suppurativa, Sweat Gland  Abscess Hidradenitis suppurativa is a long lasting (chronic), uncommon disease of the sweat glands. With this, boil-like lumps and scarring develop in the groin, some times under the arms (axillae), and under the breasts. It may also uncommonly occur behind the ears, in the crease of the buttocks, and around the genitals.  CAUSES  The cause is from a blocking of the sweat glands. They then become infected. It may cause drainage and odor. It is not contagious. So it cannot be given to someone else. It most often shows up in puberty (about 5810 to 24 years of age). But it may happen much later. It is similar to acne which is a disease of the sweat glands. This condition is slightly more common in African-Americans and women. SYMPTOMS   Hidradenitis usually starts as one or more red, tender, swellings in the groin or under the arms (axilla).  Over a period of hours to days the lesions get larger. They often open to the skin surface, draining clear to yellow-colored fluid.  The infected area heals with scarring. DIAGNOSIS  Your caregiver makes this diagnosis by looking at you. Sometimes cultures (growing germs on plates in the lab) may be taken. This is to see what germ (bacterium) is causing the infection.  TREATMENT   Topical germ killing medicine applied to the skin (antibiotics) are the treatment of choice. Antibiotics taken by mouth (systemic) are sometimes needed when the condition is getting worse or is severe.  Avoid tight-fitting clothing which traps moisture in.  Dirt does not cause hidradenitis and it is not caused by poor hygiene.  Involved areas should be cleaned daily using an antibacterial soap. Some patients find that the liquid form of Lever 2000, applied to the involved areas as a lotion after bathing, can help reduce the odor related to this condition.  Sometimes surgery is needed to drain infected areas or remove scarred tissue. Removal of large amounts of tissue is used only in  severe cases.  Birth control pills may be helpful.  Oral retinoids (vitamin A derivatives) for 6 to 12 months which are effective for acne may also help this condition.  Weight loss will improve but not cure hidradenitis. It is made worse by being overweight. But the condition is not caused by being overweight.  This condition is more common in people who have had acne.  It may become worse under stress. There is no medical cure for hidradenitis. It can be controlled, but not cured. The condition usually continues for years with periods of getting worse and getting better (remission). Document Released: 08/13/2003 Document Revised: 03/23/2011 Document Reviewed: 03/31/2013 [facility] Patient Information 2015 [place], [place]. This information is not intended to replace advice given to you by your health care provider. Make sure you discuss any questions you have with your health care provider.

## 2013-11-01 NOTE — ED Notes (Signed)
Family at bedside. 

## 2013-11-20 ENCOUNTER — Other Ambulatory Visit (HOSPITAL_COMMUNITY): Payer: Self-pay | Admitting: Internal Medicine

## 2013-11-20 DIAGNOSIS — E282 Polycystic ovarian syndrome: Secondary | ICD-10-CM

## 2013-11-21 ENCOUNTER — Other Ambulatory Visit (HOSPITAL_COMMUNITY): Payer: Self-pay | Admitting: Internal Medicine

## 2013-11-21 DIAGNOSIS — E282 Polycystic ovarian syndrome: Secondary | ICD-10-CM

## 2013-11-23 ENCOUNTER — Ambulatory Visit (HOSPITAL_COMMUNITY): Payer: BC Managed Care – PPO

## 2013-11-23 ENCOUNTER — Ambulatory Visit (HOSPITAL_COMMUNITY)
Admission: RE | Admit: 2013-11-23 | Discharge: 2013-11-23 | Disposition: A | Discharge status: Home / Self Care | Payer: BC Managed Care – PPO | Source: Ambulatory Visit | Attending: Internal Medicine | Admitting: Internal Medicine

## 2013-11-23 DIAGNOSIS — E282 Polycystic ovarian syndrome: Secondary | ICD-10-CM | POA: Diagnosis not present

## 2013-11-23 DIAGNOSIS — N920 Excessive and frequent menstruation with regular cycle: Secondary | ICD-10-CM | POA: Insufficient documentation

## 2013-12-04 ENCOUNTER — Ambulatory Visit (HOSPITAL_COMMUNITY)
Admission: RE | Admit: 2013-12-04 | Discharge: 2013-12-04 | Disposition: A | Discharge status: Home / Self Care | Payer: BC Managed Care – PPO | Source: Ambulatory Visit | Attending: Internal Medicine | Admitting: Internal Medicine

## 2013-12-04 DIAGNOSIS — M6281 Muscle weakness (generalized): Secondary | ICD-10-CM | POA: Insufficient documentation

## 2013-12-04 DIAGNOSIS — M545 Low back pain: Secondary | ICD-10-CM | POA: Insufficient documentation

## 2013-12-04 DIAGNOSIS — M79662 Pain in left lower leg: Secondary | ICD-10-CM | POA: Diagnosis not present

## 2013-12-04 DIAGNOSIS — R293 Abnormal posture: Secondary | ICD-10-CM

## 2013-12-04 DIAGNOSIS — R262 Difficulty in walking, not elsewhere classified: Secondary | ICD-10-CM | POA: Insufficient documentation

## 2013-12-04 DIAGNOSIS — M5442 Lumbago with sciatica, left side: Secondary | ICD-10-CM

## 2013-12-04 DIAGNOSIS — Z5189 Encounter for other specified aftercare: Secondary | ICD-10-CM | POA: Insufficient documentation

## 2013-12-04 DIAGNOSIS — R29898 Other symptoms and signs involving the musculoskeletal system: Secondary | ICD-10-CM

## 2013-12-04 NOTE — Therapy (Signed)
Physical Therapy Evaluation  Patient Details  Name: Kathleen Velasquez MRN: [id number] Date of Birth: [birth year]  Encounter Date: 12/04/2013      PT End of Session - 12/04/13 1229    Visit Number 1   Number of Visits 8      Past Medical History  Diagnosis Date  . Abscess     right buttocks/thigh  . DUB (dysfunctional uterine bleeding) 05/25/2012    Past Surgical History  Procedure Laterality Date  . Hernia repair    . Tonsillectomy    . Adenoidectomy    . Hydradenitis excision  10/02/2011    Procedure: EXCISION HYDRADENITIS AXILLA;  Surgeon: Fabio Bering, MD;  Location: [facility];  Service: General;  Laterality: Right;  Right Axillary Dissection  . Cholecystectomy  01/27/2012    Procedure: LAPAROSCOPIC CHOLECYSTECTOMY;  Surgeon: Fabio Bering, MD;  Location: [facility];  Service: General;  Laterality: N/A;  . Hydradenitis excision Left 10/19/2013    Procedure: INCISION OF HIDRADENITIS SUPPURATIVA LEFT AXILLA;  Surgeon: Marlane Hatcher, MD;  Location: [facility];  Service: General;  Laterality: Left;    There were no vitals taken for this visit.  Visit Diagnosis:  Midline low back pain with left-sided sciatica  Leg weakness, bilateral  Poor posture      Subjective Assessment - 12/04/13 0904    Symptoms PT states that last February she was washing dishes and had excruciating pain.  She states that she has had pain on and off ever since.  At times she has pain going down her Lt leg.  She states that her knees give way.She was told that she needed surgery.  She was unable to afford this so she did not go se the Careers adviser.  The patient then changed MD who has referred her to therapy.      How long can you sit comfortably? five to ten minutes    How long can you stand comfortably? 30 minutes    How long can you walk comfortably? an hour    Currently in Pain? Yes   Pain Score 5   worst is a 12    Pain Location Back   Pain Orientation Lower   Pain Radiating Towards left  leg to toes     Pain Onset More than a month ago   Pain Frequency Constant   Aggravating Factors  activity   Pain Relieving Factors lying down           [facility] PT Assessment - 12/04/13 0908    Assessment   Medical Diagnosis radiating back pain    Onset Date 02/21/13   Next MD Visit 12/31/2013   Prior Therapy none   Precautions   Precautions None   Restrictions   Weight Bearing Restrictions No   Balance Screen   Has the patient fallen in the past 6 months No   Has the patient had a decrease in activity level because of a fear of falling?  Yes   Is the patient reluctant to leave their home because of a fear of falling?  No   Prior Function   Level of Independence Independent with basic ADLs   Vocation Unemployed   Leisure read, watch TV    Observation/Other Assessments   Focus on Therapeutic Outcomes (FOTO)  63   AROM   Lumbar Flexion wnl with increased pain down leg with going down    Lumbar Extension wnl with pain going down Lt leg    Lumbar - Right  Side Bend wnl   Lumbar - Left Side Bend wnl   Lumbar - Right Rotation wnl   Lumbar - Left Rotation wnl   Strength   Right Hip Flexion 5/5   Right Hip Extension 3+/5   Right Hip ABduction 3/5   Left Hip Flexion 3+/5   Left Hip Extension 3+/5   Left Hip ABduction 3+/5   Right Knee Flexion --  4-/5   Right Knee Extension 5/5   Left Knee Flexion --  4-/5   Left Knee Extension 5/5   Right Ankle Dorsiflexion 4/5   Left Ankle Dorsiflexion 4/5          [facility] Adult PT Treatment/Exercise - 12/04/13 1214    Exercises   Exercises Lumbar   Lumbar Exercises: Stretches   Standing Extension 5 reps   Lumbar Exercises: Seated   Other Seated Lumbar Exercises scapular retraction x 10   Lumbar Exercises: Supine   Ab Set 10 reps   Lumbar Exercises: Prone   Other Prone Lumbar Exercises heel squeeze x 10             PT Short Term Goals - 12/04/13 1221    PT SHORT TERM GOAL #1   Title Pt to state pain is not radiating any further than  the knee level    Time 2   Period Weeks   PT SHORT TERM GOAL #2   Title Pt to state that the worst her pain gets to is an 8/10    Time 2   Period Weeks   PT SHORT TERM GOAL #3   Title Pt to be able to verbalize the importance of proper posture and body mechanics in back care.    Time 2   Period Weeks          PT Long Term Goals - 12/04/13 1223    PT LONG TERM GOAL #1   Title Pt to state that her pain is radiating to hip onlly   Time 4   Period Weeks   PT LONG TERM GOAL #2   Title Pt to be able to demonstrate transitional motion keeping lumbar area properly stabilized.     Time 4   Period Weeks   PT LONG TERM GOAL #3   Title Pt pain to be no greater than a 4/10 80% of the day    Time 4   Period Weeks   PT LONG TERM GOAL #4   Title Pt to be able to sit in comfort for over 30 minutes to enjoy a meal    Time 4   Period Weeks          Plan - 12/04/13 1217    Clinical Impression Statement Pt is a 23 yo female who has had chronic low back pain.  She has been referred to therapy; examination demonstrates decreased core and LE strength, fair posture and increased pain.  Ms. Waldo LaineWare will benefit from skilled therapy to address these issues and improve her quality of life.     Pt will benefit from skilled therapeutic intervention in order to improve on the following deficits Decreased activity tolerance;Decreased strength;Difficulty walking;Pain;Improper body mechanics;Postural dysfunction   Rehab Potential Good   PT Frequency 2x / week   PT Treatment/Interventions ADLs/Self Care Home Management;Therapeutic exercise;Therapeutic activities;Patient/family education   PT Next Visit Plan continue with stabilization beginning nonweightbearing and as pt masters this progress to weight bearing and functional activities.  Continue to educate in the importance of posture and body  mechanics in back pain.     PT Home Exercise Plan given        Problem List Patient Active Problem List    Diagnosis Date Noted  . Anovulatory (dysfunctional uterine) bleeding 06/07/2012  . DUB (dysfunctional uterine bleeding) 05/25/2012  . FOOT PAIN, CHRONIC 05/09/2007                   Donnamae JudeCindy Russell PT/CLT [phone]   12/04/2013, 12:29 PM

## 2013-12-04 NOTE — Patient Instructions (Signed)
Backward Bend (Standing)  http://orth.exer.us/178   Copyright  VHI. All rights reserved.  Knee-to-Chest Stretch: Unilateral http://orth.exer.us/126    Copyright  VHI. All rights reserved.  Hamstring Stretch: Active   Scapular Retraction: Elbow Flexion (Standing)   With elbows bent to 90, pinch shoulder blades together and rotate arms out, keeping elbows bent. Repeat ____ times per set. Do ____ sets per session. Do ____ sessions per day.  http://orth.exer.us/949   Copyright  VHI. All rights reserved.  . Shttp://orth.exer.us/158   Copyright  VHI. All rights reserved.  Isometric Abdominal   Lying on back with knees bent, tighten stomach by pressing elbows down. Hold ____ seconds. Repeat ____ times per set. Do ____ sets per session. Do ____ sessions per day.  http://orth.exer.us/1086   Copyright  VHI. All rights reserved.  Bent Leg Lift (Hook-Lying)   Tighten stomach and slowly raise right leg ____ inches from floor. Keep trunk rigid. Hold ____ seconds. Repeat ____ times per set. Do ____ sets per session. Do ____ sessions per day.  http://orth.exer.us/1090   Copyright  VHI. All rights reserved.  Bridging   Slowly raise buttocks from floor, keeping stomach tight. Repeat ____ times per set. Do ____ sets per session. Do ____ sessions per day.  http://orth.exer.us/1096   Copyright  VHI. All rights reserved.  Heel Squeeze (Prone)   Abdomen supported, bend knees and gently squeeze heels together. Hold ____ seconds. Repeat ____ times per set. Do ____ sets per session. Do ____ sessions per day.  http://orth.exer.us/1080   Copyright  VHI. All rights reserved.  Straight Leg Raise (Prone)   Abdomen and head supported, keep left knee locked and raise leg at hip. Avoid arching low back. Repeat ____ times per set. Do ____ sets per session. Do ____ sessions per day.  http://orth.exer.us/1112   Copyright  VHI. All rights reserved.  On Elbows  (Prone)   Rise up on elbows as high as possible, keeping hips on floor. Hold ____ seconds. Repeat ____ times per set. Do ____ sets per session. Do ____ sessions per day.   http://orth.exer.us/92   Copyright  VHI. All rights reserved.

## 2013-12-06 ENCOUNTER — Ambulatory Visit (HOSPITAL_COMMUNITY): Payer: BC Managed Care – PPO | Admitting: Physical Therapy

## 2013-12-12 ENCOUNTER — Ambulatory Visit (HOSPITAL_COMMUNITY): Payer: BC Managed Care – PPO | Attending: Physician Assistant | Admitting: Physical Therapy

## 2013-12-12 DIAGNOSIS — Z5189 Encounter for other specified aftercare: Secondary | ICD-10-CM | POA: Insufficient documentation

## 2013-12-12 DIAGNOSIS — M545 Low back pain: Secondary | ICD-10-CM | POA: Insufficient documentation

## 2013-12-12 DIAGNOSIS — M79662 Pain in left lower leg: Secondary | ICD-10-CM | POA: Insufficient documentation

## 2013-12-12 DIAGNOSIS — M6281 Muscle weakness (generalized): Secondary | ICD-10-CM | POA: Insufficient documentation

## 2013-12-12 DIAGNOSIS — R262 Difficulty in walking, not elsewhere classified: Secondary | ICD-10-CM | POA: Insufficient documentation

## 2013-12-14 ENCOUNTER — Encounter (HOSPITAL_COMMUNITY): Payer: Self-pay | Admitting: Physical Therapy

## 2013-12-14 ENCOUNTER — Ambulatory Visit (HOSPITAL_COMMUNITY): Payer: BC Managed Care – PPO | Admitting: Physical Therapy

## 2013-12-14 NOTE — Therapy (Signed)
[facility] [address] [place], [place], [address] Phone: [phone]   Fax:  [phone]  Patient Details  Name: BRYA SIMERLY MRN: [id number] Date of Birth: 02-Jul-1990  Encounter Date: 12/14/2013   PHYSICAL THERAPY DISCHARGE SUMMARY  Visits from Start of Care: 1/4  Current functional level related to goals / functional outcomes: Unknown as patient has not attended visits since initial evaluation.    Remaining deficits: PT LONG TERM GOAL #1    Title Pt to state that her pain is radiating to hip onlly   Time 4   Period Weeks   PT LONG TERM GOAL #2   Title Pt to be able to demonstrate transitional motion keeping lumbar area properly stabilized.    Time 4   Period Weeks   PT LONG TERM GOAL #3   Title Pt pain to be no greater than a 4/10 80% of the day    Time 4   Period Weeks   PT LONG TERM GOAL #4   Title Pt to be able to sit in comfort for over 30 minutes to enjoy a meal    Time 4   Period Weeks      Education / Equipment: HEP given at initial evaluation.  Plan:                                                    Patient goals were not met. Patient is being discharged due to not returning since the last visit.  ?????        Lonna Cobb, DPT [phone]

## 2013-12-18 ENCOUNTER — Ambulatory Visit (HOSPITAL_COMMUNITY): Payer: BC Managed Care – PPO | Admitting: Physical Therapy

## 2013-12-20 ENCOUNTER — Telehealth: Payer: Self-pay | Admitting: *Deleted

## 2013-12-20 ENCOUNTER — Encounter (HOSPITAL_COMMUNITY): Payer: Self-pay | Admitting: Physical Therapy

## 2013-12-20 NOTE — Telephone Encounter (Signed)
Patient called because she is having spotting since 11/11 she is on nuvaring. Spotting is very light and doesn't require more than a panty liner. Patient is going to see a specialist for her PCOS, she wonders if this is related to that. I advised that she keep an eye on the bleeding and if it becomes heavier or doesn't go away to let us know, otherwise she can discuss it with the pcos specialist.

## 2013-12-22 ENCOUNTER — Telehealth: Payer: Self-pay | Admitting: Obstetrics and Gynecology

## 2013-12-22 NOTE — Addendum Note (Signed)
Encounter addended by: Bella Kennedyynthia J Jazyiah Yiu, PT on: 12/22/2013  3:25 PM<BR>     Documentation filed: Letters

## 2013-12-23 ENCOUNTER — Encounter (HOSPITAL_COMMUNITY): Payer: Self-pay | Admitting: Cardiology

## 2013-12-23 ENCOUNTER — Emergency Department (HOSPITAL_COMMUNITY)
Admission: EM | Admit: 2013-12-23 | Discharge: 2013-12-23 | Disposition: A | Discharge status: Home / Self Care | Payer: BC Managed Care – PPO | Attending: Emergency Medicine | Admitting: Emergency Medicine

## 2013-12-23 DIAGNOSIS — Z79899 Other long term (current) drug therapy: Secondary | ICD-10-CM | POA: Insufficient documentation

## 2013-12-23 DIAGNOSIS — Z880 Allergy status to penicillin: Secondary | ICD-10-CM | POA: Diagnosis not present

## 2013-12-23 DIAGNOSIS — Z3202 Encounter for pregnancy test, result negative: Secondary | ICD-10-CM | POA: Insufficient documentation

## 2013-12-23 DIAGNOSIS — N939 Abnormal uterine and vaginal bleeding, unspecified: Secondary | ICD-10-CM

## 2013-12-23 DIAGNOSIS — Z872 Personal history of diseases of the skin and subcutaneous tissue: Secondary | ICD-10-CM | POA: Insufficient documentation

## 2013-12-23 DIAGNOSIS — Z720 Tobacco use: Secondary | ICD-10-CM | POA: Diagnosis not present

## 2013-12-23 DIAGNOSIS — Z8639 Personal history of other endocrine, nutritional and metabolic disease: Secondary | ICD-10-CM | POA: Diagnosis not present

## 2013-12-23 HISTORY — DX: Polycystic ovarian syndrome: E28.2

## 2013-12-23 LAB — WET PREP, GENITAL
Clue Cells Wet Prep HPF POC: NONE SEEN
Trich, Wet Prep: NONE SEEN
Yeast Wet Prep HPF POC: NONE SEEN

## 2013-12-23 LAB — URINALYSIS, ROUTINE W REFLEX MICROSCOPIC
BILIRUBIN URINE: NEGATIVE
Glucose, UA: NEGATIVE mg/dL
KETONES UR: NEGATIVE mg/dL
Leukocytes, UA: NEGATIVE
NITRITE: NEGATIVE
Specific Gravity, Urine: >1.03 — ABNORMAL HIGH (ref 1.005–1.030)
Urobilinogen, UA: 0.2 mg/dL (ref 0.0–1.0)
pH: 6 (ref 5.0–8.0)

## 2013-12-23 LAB — CBC WITH DIFFERENTIAL/PLATELET
Basophils Absolute: 0 10*3/uL (ref 0.0–0.1)
Basophils Relative: 0 % (ref 0–1)
Eosinophils Absolute: 0.2 10*3/uL (ref 0.0–0.7)
Eosinophils Relative: 2 % (ref 0–5)
HEMATOCRIT: 41.9 % (ref 36.0–46.0)
HEMOGLOBIN: 13.8 g/dL (ref 12.0–15.0)
LYMPHS ABS: 2.4 10*3/uL (ref 0.7–4.0)
Lymphocytes Relative: 25 % (ref 12–46)
MCH: 29.1 pg (ref 26.0–34.0)
MCHC: 32.9 g/dL (ref 30.0–36.0)
MCV: 88.2 fL (ref 78.0–100.0)
Monocytes Absolute: 0.4 10*3/uL (ref 0.1–1.0)
Monocytes Relative: 4 % (ref 3–12)
NEUTROS PCT: 69 % (ref 43–77)
Neutro Abs: 6.6 10*3/uL (ref 1.7–7.7)
Platelets: 324 10*3/uL (ref 150–400)
RBC: 4.75 MIL/uL (ref 3.87–5.11)
RDW: 15.4 % (ref 11.5–15.5)
WBC: 9.6 10*3/uL (ref 4.0–10.5)

## 2013-12-23 LAB — URINE MICROSCOPIC-ADD ON

## 2013-12-23 LAB — POC URINE PREG, ED: Preg Test, Ur: NEGATIVE

## 2013-12-23 MED ORDER — MEGESTROL ACETATE 20 MG PO TABS: ORAL_TABLET | ORAL | Status: DC

## 2013-12-23 NOTE — ED Notes (Signed)
Vaginal bleeding times one month.  States it is not heavy.  US  done and diagnosed with polycystic ovaries.  Was to f/u with ob /gyn yesterday and they canceled her appointment.  Also thinks that she has some packing left in an incision under her left arm.

## 2013-12-23 NOTE — Discharge Instructions (Signed)
Keep your follow up appointment with your GYN and call Dr. Malvin JohnsBradford for follow up about your wound under your arm.   Abnormal Uterine Bleeding Abnormal uterine bleeding means bleeding from the vagina that is not your normal menstrual period. This can be:  Bleeding or spotting between periods.  Bleeding after sex (sexual intercourse).  Bleeding that is heavier or more than normal.  Periods that last longer than usual.  Bleeding after menopause. There are many problems that may cause this. Treatment will depend on the cause of the bleeding. Any kind of bleeding that is not normal should be reviewed by your doctor.  HOME CARE Watch your condition for any changes. These actions may lessen any discomfort you are having:  Do not use tampons or douches as told by your doctor.  Change your pads often. You should get regular pelvic exams and Pap tests. Keep all appointments for tests as told by your doctor. GET HELP IF:  You are bleeding for more than 1 week.  You feel dizzy at times. GET HELP RIGHT AWAY IF:   You pass out.  You have to change pads every 15 to 30 minutes.  You have belly pain.  You have a fever.  You become sweaty or weak.  You are passing large blood clots from the vagina.  You feel sick to your stomach (nauseous) and throw up (vomit). MAKE SURE YOU:  Understand these instructions.  Will watch your condition.  Will get help right away if you are not doing well or get worse. Document Released: 10/26/2008 Document Revised: 01/03/2013 Document Reviewed: 07/28/2012 [facility] Patient Information 2015 [place], [place]. This information is not intended to replace advice given to you by your health care provider. Make sure you discuss any questions you have with your health care provider.

## 2013-12-23 NOTE — ED Notes (Signed)
Started menstrual cycle Nov 11th with regular period and on days seven, period was dark brown with mild to moderate vaginal discharge, denies smell.  Having pelvic pain.  Had surgery to left under arm Oct 8th and thinks there is still packing remaining in one area and second area having drainage.  Rates pain under arm an 8.  Have not taken any pain medication this am.

## 2013-12-23 NOTE — ED Notes (Signed)
Pt given drink, approved by NP

## 2013-12-23 NOTE — ED Provider Notes (Signed)
CSN: [id number]     Arrival date & time 12/23/13  8295 History   First MD Initiated Contact with Patient 12/23/13 1003     Chief Complaint  Patient presents with  . Vaginal Bleeding     (Consider location/radiation/quality/duration/timing/severity/associated sxs/prior Treatment) HPI Kathleen Velasquez is a 23 y.o. G0P0 who presents to the ED with vaginal bleeding that started about a month ago. She has had GYN care by Dr. Emelda Fear in the past but has an appointment with [facility] later in the month.  She uses the [employer] for birth control and to help control bleeding. She describes the bleeding as dark brown that has now turned bright red. She is also concerned about an area under her left arm where she had I&D by Dr. Elige Radon and she thinks a piece of gauze is still in the wound.   Past Medical History  Diagnosis Date  . Abscess     right buttocks/thigh  . DUB (dysfunctional uterine bleeding) 05/25/2012  . Polycystic ovaries    Past Surgical History  Procedure Laterality Date  . Hernia repair    . Tonsillectomy    . Adenoidectomy    . Hydradenitis excision  10/02/2011    Procedure: EXCISION HYDRADENITIS AXILLA;  Surgeon: Fabio Bering, MD;  Location: [facility];  Service: General;  Laterality: Right;  Right Axillary Dissection  . Cholecystectomy  01/27/2012    Procedure: LAPAROSCOPIC CHOLECYSTECTOMY;  Surgeon: Fabio Bering, MD;  Location: [facility];  Service: General;  Laterality: N/A;  . Hydradenitis excision Left 10/19/2013    Procedure: INCISION OF HIDRADENITIS SUPPURATIVA LEFT AXILLA;  Surgeon: Marlane Hatcher, MD;  Location: [facility];  Service: General;  Laterality: Left;   Family History  Problem Relation Age of Onset  . Diabetes Mother   . Hypertension Mother   . Hyperlipidemia Mother   . Neuropathy Mother   . Diabetes Maternal Grandmother   . Hypertension Maternal Grandmother   . Heart disease Maternal Grandmother     CHF  . COPD Maternal Grandmother   .  Cirrhosis Maternal Grandmother   . Cancer Maternal Grandfather     lung  . Diabetes Paternal Grandmother   . Diabetes Paternal Grandfather   . Aneurysm Paternal Grandfather   . Stroke Other    History  Substance Use Topics  . Smoking status: Current Every Day Smoker -- 0.50 packs/day for 5 years    Types: Cigarettes  . Smokeless tobacco: Never Used  . Alcohol Use: Yes     Comment: social use   OB History    Gravida Para Term Preterm AB TAB SAB Ectopic Multiple Living   0              Review of Systems Negative except as stated in HPI   Allergies  Bactrim; Ciprofloxacin; Other; Penicillins; and Percocet  Home Medications   Prior to Admission medications   Medication Sig Start Date End Date Taking? Authorizing Provider  acetaminophen (TYLENOL) 500 MG tablet Take 1,000 mg by mouth every 6 (six) hours as needed for moderate pain.   Yes Historical Provider, MD  buPROPion (WELLBUTRIN XL) 150 MG 24 hr tablet Take 1 tablet by mouth daily. 12/14/13  Yes Historical Provider, MD  clonazePAM (KLONOPIN) 0.5 MG tablet Take 1 tablet by mouth 3 (three) times daily as needed for anxiety.  12/14/13  Yes Historical Provider, MD  etonogestrel-ethinyl estradiol (NUVARING) 0.12-0.015 MG/24HR vaginal ring Insert vaginally and leave in place for 3 consecutive  weeks, then remove for 1 week. 09/27/13  Yes Peggy Constant, MD  ibuprofen (ADVIL,MOTRIN) 200 MG tablet Take 600 mg by mouth every 6 (six) hours as needed for moderate pain.   Yes Historical Provider, MD  busPIRone (BUSPAR) 15 MG tablet Take 1 tablet by mouth 2 (two) times daily. 07/31/13   Historical Provider, MD  megestrol (MEGACE) 20 MG tablet Take 2 tablets (40 mg) three times a day for 3 days then two tablets twice a day for 3 days then 2 tablets once a day. 12/23/13   Lonni Dirden Orlene OchM Jaylia Pettus, NP   BP 130/72 mmHg  Pulse 76  Temp(Src) 99 F (37.2 C) (Oral)  Resp 18  Ht 5\' 4"  (1.626 m)  Wt 236 lb (107.049 kg)  BMI 40.49 kg/m2  SpO2 99% Physical  Exam  Constitutional: She is oriented to person, place, and time. She appears well-developed and well-nourished.  HENT:  Head: Normocephalic.  Eyes: EOM are normal.  Neck: Neck supple.  Cardiovascular: Normal rate.   Pulmonary/Chest: Effort normal.  Abdominal: Soft. There is no tenderness.  Genitourinary:  External genitalia without lesions. Small blood vaginal vault, no CMT, no adnexal tenderness, uterus without palpable enlargement.   Musculoskeletal: Normal range of motion.  Left axilla with two area where incision and drainage done. One area open with minimal drainage. Second area healed. There is a tiny thread extending from the wound. The area is soft.   Neurological: She is alert and oriented to person, place, and time. No cranial nerve deficit.  Skin: Skin is warm and dry.  Psychiatric: She has a normal mood and affect. Her behavior is normal.  Nursing note and vitals reviewed.   ED Course  Procedures  Offered to patient to try and remove the string like material from the axilla but she declined and will follow up with Dr. Malvin JohnsBradford.   Results for orders placed or performed during the hospital encounter of 12/23/13 (from the past 24 hour(s))  Urinalysis, Routine w reflex microscopic     Status: Abnormal   Collection Time: 12/23/13 10:20 AM  Result Value Ref Range   Color, Urine YELLOW YELLOW   APPearance CLEAR CLEAR   Specific Gravity, Urine >1.030 (H) 1.005 - 1.030   pH 6.0 5.0 - 8.0   Glucose, UA NEGATIVE NEGATIVE mg/dL   Hgb urine dipstick LARGE (A) NEGATIVE   Bilirubin Urine NEGATIVE NEGATIVE   Ketones, ur NEGATIVE NEGATIVE mg/dL   Protein, ur TRACE (A) NEGATIVE mg/dL   Urobilinogen, UA 0.2 0.0 - 1.0 mg/dL   Nitrite NEGATIVE NEGATIVE   Leukocytes, UA NEGATIVE NEGATIVE  Urine microscopic-add on     Status: Abnormal   Collection Time: 12/23/13 10:20 AM  Result Value Ref Range   WBC, UA 3-6 <3 WBC/hpf   RBC / HPF TOO NUMEROUS TO COUNT <3 RBC/hpf   Bacteria, UA FEW  (A) RARE   Casts HYALINE CASTS (A) NEGATIVE  CBC with Differential     Status: None   Collection Time: 12/23/13 10:29 AM  Result Value Ref Range   WBC 9.6 4.0 - 10.5 K/uL   RBC 4.75 3.87 - 5.11 MIL/uL   Hemoglobin 13.8 12.0 - 15.0 g/dL   HCT 16.141.9 09.636.0 - 04.546.0 %   MCV 88.2 78.0 - 100.0 fL   MCH 29.1 26.0 - 34.0 pg   MCHC 32.9 30.0 - 36.0 g/dL   RDW 40.915.4 81.111.5 - 91.415.5 %   Platelets 324 150 - 400 K/uL   Neutrophils Relative % 69  43 - 77 %   Neutro Abs 6.6 1.7 - 7.7 K/uL   Lymphocytes Relative 25 12 - 46 %   Lymphs Abs 2.4 0.7 - 4.0 K/uL   Monocytes Relative 4 3 - 12 %   Monocytes Absolute 0.4 0.1 - 1.0 K/uL   Eosinophils Relative 2 0 - 5 %   Eosinophils Absolute 0.2 0.0 - 0.7 K/uL   Basophils Relative 0 0 - 1 %   Basophils Absolute 0.0 0.0 - 0.1 K/uL  POC urine preg[facility])     Status: None   Collection Time: 12/23/13 10:41 AM  Result Value Ref Range   Preg Test, Ur NEGATIVE NEGATIVE  Wet prep, genital     Status: Abnormal   Collection Time: 12/23/13 11:20 AM  Result Value Ref Range   Yeast Wet Prep HPF POC NONE SEEN NONE SEEN   Trich, Wet Prep NONE SEEN NONE SEEN   Clue Cells Wet Prep HPF POC NONE SEEN NONE SEEN   WBC, Wet Prep HPF POC FEW (A) NONE SEEN     MDM  23 y.o. female with abnormal vaginal bleeding x one month stable for discharge without anemia or hemorrhage. She is to follow up with the GYN as scheduled. Will start Megace to decrease the bleeding. She will return as needed for any problems.    Medication List    STOP taking these medications        clindamycin 300 MG capsule  Commonly known as:  CLEOCIN     doxycycline 100 MG capsule  Commonly known as:  VIBRAMYCIN     HYDROcodone-acetaminophen 5-325 MG per tablet  Commonly known as:  NORCO/VICODIN     ondansetron 4 MG tablet  Commonly known as:  ZOFRAN      TAKE these medications        megestrol 20 MG tablet  Commonly known as:  MEGACE  Take 2 tablets (40 mg) three times a day for 3 days  then two tablets twice a day for 3 days then 2 tablets once a day.      ASK your doctor about these medications        acetaminophen 500 MG tablet  Commonly known as:  TYLENOL  Take 1,000 mg by mouth every 6 (six) hours as needed for moderate pain.     buPROPion 150 MG 24 hr tablet  Commonly known as:  WELLBUTRIN XL  Take 1 tablet by mouth daily.     busPIRone 15 MG tablet  Commonly known as:  BUSPAR  Take 1 tablet by mouth 2 (two) times daily.     clonazePAM 0.5 MG tablet  Commonly known as:  KLONOPIN  Take 1 tablet by mouth 3 (three) times daily as needed for anxiety.     etonogestrel-ethinyl estradiol 0.12-0.015 MG/24HR vaginal ring  Commonly known as:  NUVARING  Insert vaginally and leave in place for 3 consecutive weeks, then remove for 1 week.     ibuprofen 200 MG tablet  Commonly known as:  ADVIL,MOTRIN  Take 600 mg by mouth every 6 (six) hours as needed for moderate pain.         Final diagnoses:  Abnormal vaginal bleeding     Janne NapoleonHope M Macky Galik, NP 12/23/13 1740  Juliet RudeNathan R. Rubin PayorPickering, MD 12/24/13 [phone]

## 2013-12-24 LAB — HIV ANTIBODY (ROUTINE TESTING W REFLEX): HIV 1&2 Ab, 4th Generation: NONREACTIVE

## 2013-12-24 LAB — RPR

## 2013-12-25 ENCOUNTER — Encounter (HOSPITAL_COMMUNITY): Payer: Self-pay | Admitting: Physical Therapy

## 2013-12-25 LAB — GC/CHLAMYDIA PROBE AMP
CT Probe RNA: NEGATIVE
GC PROBE AMP APTIMA: NEGATIVE

## 2013-12-27 ENCOUNTER — Encounter (HOSPITAL_COMMUNITY): Payer: Self-pay | Admitting: Physical Therapy

## 2013-12-28 NOTE — Telephone Encounter (Addendum)
Pt states that she was diagnosed by PCP with PCOS. Pt had an US and has an appointment with a specialist. Pt states that she has been bleeding since November 11th, pt states that she had brownish bleeding for about 2 weeks and then bright red bleeding and then back to brownish bleeding. Pt states that she has been using Nuvarings but the bleeding has gotten any better and has not stopped. Pt is unsure if it is connected to the PCOS.    SEE note by jvf.

## 2013-12-29 MED ORDER — ESTRADIOL 1 MG PO TABS: 1.0000 mg | ORAL_TABLET | Freq: Two times a day (BID) | ORAL | Status: DC

## 2013-12-29 NOTE — Telephone Encounter (Signed)
Pt informed Estradiol 1 mg e-scribed to pharmacy, if pt continues to have BTB will need to make an appt per Dr. Emelda FearFerguson.

## 2013-12-29 NOTE — Telephone Encounter (Signed)
Given Rx for estradiol 1mg  bid on days of BTB on Nuvaring, up to a week in a row. If continues to bleed , needs to be seen

## 2014-01-01 ENCOUNTER — Encounter (HOSPITAL_COMMUNITY): Payer: Self-pay | Admitting: Physical Therapy

## 2014-01-03 ENCOUNTER — Encounter (HOSPITAL_COMMUNITY): Payer: Self-pay | Admitting: Physical Therapy

## 2014-01-05 ENCOUNTER — Emergency Department (HOSPITAL_COMMUNITY): Payer: BC Managed Care – PPO

## 2014-01-05 ENCOUNTER — Emergency Department (HOSPITAL_COMMUNITY)
Admission: EM | Admit: 2014-01-05 | Discharge: 2014-01-05 | Disposition: A | Discharge status: Home / Self Care | Payer: BC Managed Care – PPO | Attending: Emergency Medicine | Admitting: Emergency Medicine

## 2014-01-05 ENCOUNTER — Encounter (HOSPITAL_COMMUNITY): Payer: Self-pay | Admitting: Emergency Medicine

## 2014-01-05 DIAGNOSIS — W108XXA Fall (on) (from) other stairs and steps, initial encounter: Secondary | ICD-10-CM | POA: Insufficient documentation

## 2014-01-05 DIAGNOSIS — Z872 Personal history of diseases of the skin and subcutaneous tissue: Secondary | ICD-10-CM | POA: Diagnosis not present

## 2014-01-05 DIAGNOSIS — N938 Other specified abnormal uterine and vaginal bleeding: Secondary | ICD-10-CM | POA: Diagnosis not present

## 2014-01-05 DIAGNOSIS — E669 Obesity, unspecified: Secondary | ICD-10-CM | POA: Diagnosis not present

## 2014-01-05 DIAGNOSIS — S3992XA Unspecified injury of lower back, initial encounter: Secondary | ICD-10-CM | POA: Insufficient documentation

## 2014-01-05 DIAGNOSIS — Y9301 Activity, walking, marching and hiking: Secondary | ICD-10-CM | POA: Insufficient documentation

## 2014-01-05 DIAGNOSIS — Z8639 Personal history of other endocrine, nutritional and metabolic disease: Secondary | ICD-10-CM | POA: Insufficient documentation

## 2014-01-05 DIAGNOSIS — Y9289 Other specified places as the place of occurrence of the external cause: Secondary | ICD-10-CM | POA: Insufficient documentation

## 2014-01-05 DIAGNOSIS — Z880 Allergy status to penicillin: Secondary | ICD-10-CM | POA: Diagnosis not present

## 2014-01-05 DIAGNOSIS — Y998 Other external cause status: Secondary | ICD-10-CM | POA: Insufficient documentation

## 2014-01-05 DIAGNOSIS — Z720 Tobacco use: Secondary | ICD-10-CM | POA: Diagnosis not present

## 2014-01-05 DIAGNOSIS — M549 Dorsalgia, unspecified: Secondary | ICD-10-CM

## 2014-01-05 MED ORDER — ACETAMINOPHEN 500 MG PO TABS
1000.0000 mg | ORAL_TABLET | Freq: Once | ORAL | Status: AC
  Administered 2014-01-05: 1000 mg via ORAL
  Filled 2014-01-05: qty 2

## 2014-01-05 MED ORDER — CYCLOBENZAPRINE HCL 10 MG PO TABS: 10.0000 mg | ORAL_TABLET | Freq: Two times a day (BID) | ORAL | Status: DC | PRN

## 2014-01-05 MED ORDER — IBUPROFEN 800 MG PO TABS
800.0000 mg | ORAL_TABLET | Freq: Once | ORAL | Status: AC
  Administered 2014-01-05: 800 mg via ORAL
  Filled 2014-01-05: qty 1

## 2014-01-05 MED ORDER — PREDNISONE 50 MG PO TABS: ORAL_TABLET | ORAL | Status: DC

## 2014-01-05 MED ORDER — TRAMADOL HCL 50 MG PO TABS: 50.0000 mg | ORAL_TABLET | Freq: Four times a day (QID) | ORAL | Status: DC | PRN

## 2014-01-05 NOTE — ED Notes (Signed)
Pt taking out trrash, walking down steps and slipped. Fell two steps down. Now having pain radiating down legs from back.

## 2014-01-05 NOTE — ED Provider Notes (Signed)
CSN: [id number]     Arrival date & time 01/05/14  0254 History   First MD Initiated Contact with Patient 01/05/14 0308     Chief Complaint  Patient presents with  . Back Pain     (Consider location/radiation/quality/duration/timing/severity/associated sxs/prior Treatment) HPI.... Accidental mechanical fall down 2 steps a brief time ago with hyper flexion of the left knee and hyper extension of the left hip. Now complains of pain in the lower back with radiation to bilateral buttocks and legs. Patient is ambulatory. Severity is moderate. Palpation and positioning make pain worse.  Past Medical History  Diagnosis Date  . Abscess     right buttocks/thigh  . DUB (dysfunctional uterine bleeding) 05/25/2012  . Polycystic ovaries    Past Surgical History  Procedure Laterality Date  . Hernia repair    . Tonsillectomy    . Adenoidectomy    . Hydradenitis excision  10/02/2011    Procedure: EXCISION HYDRADENITIS AXILLA;  Surgeon: Fabio BeringBrent C Ziegler, MD;  Location: [facility];  Service: General;  Laterality: Right;  Right Axillary Dissection  . Cholecystectomy  01/27/2012    Procedure: LAPAROSCOPIC CHOLECYSTECTOMY;  Surgeon: Fabio BeringBrent C Ziegler, MD;  Location: [facility];  Service: General;  Laterality: N/A;  . Hydradenitis excision Left 10/19/2013    Procedure: INCISION OF HIDRADENITIS SUPPURATIVA LEFT AXILLA;  Surgeon: Marlane HatcherWilliam S Bradford, MD;  Location: [facility];  Service: General;  Laterality: Left;   Family History  Problem Relation Age of Onset  . Diabetes Mother   . Hypertension Mother   . Hyperlipidemia Mother   . Neuropathy Mother   . Diabetes Maternal Grandmother   . Hypertension Maternal Grandmother   . Heart disease Maternal Grandmother     CHF  . COPD Maternal Grandmother   . Cirrhosis Maternal Grandmother   . Cancer Maternal Grandfather     lung  . Diabetes Paternal Grandmother   . Diabetes Paternal Grandfather   . Aneurysm Paternal Grandfather   . Stroke Other    History  Substance  Use Topics  . Smoking status: Current Every Day Smoker -- 0.50 packs/day for 5 years    Types: Cigarettes  . Smokeless tobacco: Never Used  . Alcohol Use: Yes     Comment: social use   OB History    Gravida Para Term Preterm AB TAB SAB Ectopic Multiple Living   0              Review of Systems  All other systems reviewed and are negative.     Allergies  Bactrim; Ciprofloxacin; Other; Penicillins; and Percocet  Home Medications   Prior to Admission medications   Medication Sig Start Date End Date Taking? Authorizing Provider  acetaminophen (TYLENOL) 500 MG tablet Take 1,000 mg by mouth every 6 (six) hours as needed for moderate pain.    Historical Provider, MD  buPROPion (WELLBUTRIN XL) 150 MG 24 hr tablet Take 1 tablet by mouth daily. 12/14/13   Historical Provider, MD  busPIRone (BUSPAR) 15 MG tablet Take 1 tablet by mouth 2 (two) times daily. 07/31/13   Historical Provider, MD  clonazePAM (KLONOPIN) 0.5 MG tablet Take 1 tablet by mouth 3 (three) times daily as needed for anxiety.  12/14/13   Historical Provider, MD  cyclobenzaprine (FLEXERIL) 10 MG tablet Take 1 tablet (10 mg total) by mouth 2 (two) times daily as needed for muscle spasms. 01/05/14   Donnetta HutchingBrian Surina Storts, MD  estradiol (ESTRACE) 1 MG tablet Take 1 tablet (1 mg total) by mouth 2 (  two) times daily. On days of breakthrough bleeding, up to a week in a row 12/29/13   Tilda BurrowJohn Ferguson V, MD  etonogestrel-ethinyl estradiol (NUVARING) 0.12-0.015 MG/24HR vaginal ring Insert vaginally and leave in place for 3 consecutive weeks, then remove for 1 week. 09/27/13   Peggy Constant, MD  ibuprofen (ADVIL,MOTRIN) 200 MG tablet Take 600 mg by mouth every 6 (six) hours as needed for moderate pain.    Historical Provider, MD  megestrol (MEGACE) 20 MG tablet Take 2 tablets (40 mg) three times a day for 3 days then two tablets twice a day for 3 days then 2 tablets once a day. 12/23/13   Hope Orlene OchM Neese, NP  predniSONE (DELTASONE) 50 MG tablet 1 tablet for  5 days, one half tablet for 5 days 01/05/14   Donnetta HutchingBrian Takumi Din, MD  traMADol (ULTRAM) 50 MG tablet Take 1 tablet (50 mg total) by mouth every 6 (six) hours as needed. 01/05/14   Donnetta HutchingBrian Radin Raptis, MD   BP 112/69 mmHg  Pulse 98  Temp(Src) 98.4 F (36.9 C) (Oral)  Resp 17  Ht 5\' 4"  (1.626 m)  Wt 232 lb (105.235 kg)  BMI 39.80 kg/m2  SpO2 97%  LMP 11/22/2013 Physical Exam  Constitutional: She is oriented to person, place, and time. She appears well-developed and well-nourished.  Obese, patient is ambulatory  HENT:  Head: Normocephalic and atraumatic.  Eyes: Conjunctivae and EOM are normal. Pupils are equal, round, and reactive to light.  Neck: Normal range of motion. Neck supple.  Cardiovascular: Normal rate and regular rhythm.   Pulmonary/Chest: Effort normal and breath sounds normal.  Abdominal: Soft. Bowel sounds are normal.  Musculoskeletal:  Generalized lower back tenderness. Pain with straight leg raise bilaterally.  Neurological: She is alert and oriented to person, place, and time.  Skin: Skin is warm and dry.  Psychiatric: She has a normal mood and affect. Her behavior is normal.  Nursing note and vitals reviewed.   ED Course  Procedures (including critical care time) Labs Review Labs Reviewed - No data to display  Imaging Review Dg Lumbar Spine Complete  01/05/2014   CLINICAL DATA:  Status post fall down 2 steps. Lower back pain, radiating to the legs. Initial encounter.  EXAM: LUMBAR SPINE - COMPLETE 4+ VIEW  COMPARISON:  Lumbar spine radiographs performed 03/16/2013, and lumbar spine MRI performed 03/27/2013  FINDINGS: There is no evidence of fracture or subluxation. Vertebral bodies demonstrate normal height and alignment. Intervertebral disc spaces are preserved. The visualized neural foramina are grossly unremarkable in appearance.  The visualized bowel gas pattern is unremarkable in appearance; air and stool are noted within the colon. The sacroiliac joints are within normal  limits. Clips are noted within the right upper quadrant, reflecting prior cholecystectomy.  IMPRESSION: No evidence of fracture or subluxation along the lumbar spine.   Electronically Signed   By: Roanna RaiderJeffery  Chang M.D.   On: 01/05/2014 05:17     EKG Interpretation None      MDM   Final diagnoses:  Back pain    Patient is ambulatory. Plain films of lumbar spine show no evidence of acute injury. Patient has been advised that she may need an MRI scan in the future. Discharge medications tramadol, Flexeril 10 mg, prednisone.   Donnetta HutchingBrian Juniel Groene, MD 01/05/14 [phone]

## 2014-01-05 NOTE — Discharge Instructions (Signed)
X-ray showed no acute problems. Medication for pain, muscle relaxer, prednisone. If symptoms persist, you may need an MRI scan in the future.

## 2014-01-05 NOTE — ED Notes (Signed)
Discharge instructions given, pt demonstrated teach back and verbal understanding. No concerns voiced.  

## 2014-01-19 ENCOUNTER — Ambulatory Visit (INDEPENDENT_AMBULATORY_CARE_PROVIDER_SITE_OTHER): Payer: BLUE CROSS/BLUE SHIELD | Admitting: Psychiatry

## 2014-01-19 ENCOUNTER — Encounter (HOSPITAL_COMMUNITY): Payer: Self-pay | Admitting: Psychiatry

## 2014-01-19 VITALS — BP 119/71 | HR 78 | Ht 64.0 in | Wt 232.4 lb

## 2014-01-19 DIAGNOSIS — F411 Generalized anxiety disorder: Secondary | ICD-10-CM

## 2014-01-19 MED ORDER — CLONAZEPAM 1 MG PO TABS: 1.0000 mg | ORAL_TABLET | Freq: Three times a day (TID) | ORAL | Status: DC | PRN

## 2014-01-19 MED ORDER — ESCITALOPRAM OXALATE 20 MG PO TABS: 20.0000 mg | ORAL_TABLET | Freq: Every day | ORAL | Status: DC

## 2014-01-19 NOTE — Progress Notes (Signed)
Psychiatric Assessment Adult  Patient Identification:  [name] Date of Evaluation:  01/19/2014 Chief Complaint: Anxiety History of Chief Complaint:   Chief Complaint  Patient presents with  . Anxiety  . Depression  . Follow-up    HPI this patient is a 24 year old single white female who lives with her fianc, her mother, 50 years old sister and her sister's 46-year-old daughter in [place]. She works at [employer] in the third shift.  The patient was referred by Dr. Dwana Melena, her primary physician for further assessment of anxiety.  The patient states that she's been having problems with anxiety and some depression since approximately March 2015. She and her fianc been together for several years and they want to have a baby together. However she's had significant problems with heavy menstrual bleeding. She's been diagnosed with polycystic ovary disease. The bleeding has gotten so bad that her recent visit with her via gynecologist recommended hysterectomy. She's been dealing with all of this since March and is been very depressed and despondent about it. Her primary physician is started her on Wellbutrin which has not helped.  The patient is also under stress regarding her 52-year-old niece. She states that her 76 year old sister, the niece's mother, does not seem at all responsible for taking care of a child so she and her mother do all the childcare and also the financial responsibilities for the child. She feels overwhelmed by all of this plus her job. She does not sleep well and never has. She is also extremely anxious and when she has to do presentations at work she stutters and has panic attacks. She often has trouble focusing and goes from thing to thing without finishing. She denies suicidal ideation and her energy is fairly good. She was starting treatment at resolution counseling but has not been able to get a return phone call for follow-up visits.  She's had no prior psychiatric treatment and no history of substance abuse. Review of Systems  Constitutional: Negative.   HENT: Negative.   Eyes: Negative.   Respiratory: Negative.   Cardiovascular: Negative.   Gastrointestinal: Negative.   Endocrine: Negative.   Genitourinary: Positive for vaginal bleeding and pelvic pain.  Musculoskeletal: Positive for back pain.  Allergic/Immunologic: Negative.   Neurological: Negative.   Hematological: Negative.   Psychiatric/Behavioral: Positive for sleep disturbance, dysphoric mood and decreased concentration. The patient is nervous/anxious.    Physical Exam not done  Depressive Symptoms: depressed mood, anhedonia, insomnia, hopelessness, anxiety, panic attacks,  (Hypo) Manic Symptoms:   Elevated Mood:  No Irritable Mood:  Yes Grandiosity:  No Distractibility:  Yes Labiality of Mood:  No Delusions:  No Hallucinations:  No Impulsivity:  No Sexually Inappropriate Behavior:  No Financial Extravagance:  No Flight of Ideas:  No  Anxiety Symptoms: Excessive Worry:  Yes Panic Symptoms:  Yes Agoraphobia:  No Obsessive Compulsive: No  Symptoms: None, Specific Phobias:  No Social Anxiety:  Yes  Psychotic Symptoms:  Hallucinations: No None Delusions:  No Paranoia:  No   Ideas of Reference:  No  PTSD Symptoms: Ever had a traumatic exposure:  No Had a traumatic exposure in the last month:  No Re-experiencing: No None Hypervigilance:  No Hyperarousal: No None Avoidance: No None  Traumatic Brain Injury: No   Past Psychiatric History: Diagnosis: Generalized anxiety disorder   Hospitalizations: None   Outpatient Care: Has only seen the therapist a couple of times   Substance Abuse Care: none  Self-Mutilation: none  Suicidal Attempts:  none  Violent Behaviors: none   Past Medical History:   Past Medical History  Diagnosis Date  . Abscess     right buttocks/thigh  . DUB (dysfunctional uterine bleeding) 05/25/2012  .  Polycystic ovaries    History of Loss of Consciousness:  No Seizure History:  No Cardiac History:  No Allergies:   Allergies  Allergen Reactions  . Bactrim Hives and Itching  . Ciprofloxacin Hives and Itching    CRNA Discontinued preop antibiotic  IVPB  Cipro in OR after developed itching and hives left arm.  Received Benadryl with relief noted. Dr.Gonzalez and Dr. Leticia PennaZiegler informed. DDallasRN  . Other Other (See Comments)    [place]urkey causes a hives, swelling, itching, and anaphylaxis.   Marland Kitchen. Penicillins Hives and Itching  . Percocet [Oxycodone-Acetaminophen] Other (See Comments)    Face flushes   Current Medications:  Current Outpatient Prescriptions  Medication Sig Dispense Refill  . acetaminophen (TYLENOL) 500 MG tablet Take 1,000 mg by mouth every 6 (six) hours as needed for moderate pain.    . cyclobenzaprine (FLEXERIL) 10 MG tablet Take 1 tablet (10 mg total) by mouth 2 (two) times daily as needed for muscle spasms. 20 tablet 0  . estradiol (ESTRACE) 1 MG tablet Take 1 tablet (1 mg total) by mouth 2 (two) times daily. On days of breakthrough bleeding, up to a week in a row (Patient taking differently: Take 1 mg by mouth 2 (two) times daily as needed. On days of breakthrough bleeding, up to a week in a row) 30 tablet 2  . etonogestrel-ethinyl estradiol (NUVARING) 0.12-0.015 MG/24HR vaginal ring Insert vaginally and leave in place for 3 consecutive weeks, then remove for 1 week. 1 each 12  . ibuprofen (ADVIL,MOTRIN) 200 MG tablet Take 600 mg by mouth every 6 (six) hours as needed for moderate pain.    . predniSONE (DELTASONE) 50 MG tablet 1 tablet for 5 days, one half tablet for 5 days 8 tablet 0  . traMADol (ULTRAM) 50 MG tablet Take 1 tablet (50 mg total) by mouth every 6 (six) hours as needed. 20 tablet 0  . clonazePAM (KLONOPIN) 1 MG tablet Take 1 tablet (1 mg total) by mouth 3 (three) times daily as needed for anxiety. 90 tablet 2  . escitalopram (LEXAPRO) 20 MG tablet Take 1 tablet  (20 mg total) by mouth daily. 30 tablet 2   No current facility-administered medications for this visit.    Previous Psychotropic Medications:  Medication Dose   Currently on Wellbutrin XL 150 g every morning and clonazepam 0.5 mg 3 times a day                        Substance Abuse History in the last 12 months: Substance Age of 1st Use Last Use Amount Specific Type  Nicotine    smokes one half pack of cigarettes per day    Alcohol      Cannabis      Opiates      Cocaine      Methamphetamines      LSD      Ecstasy      Benzodiazepines      Caffeine      Inhalants      Others:                          Medical Consequences of Substance Abuse: none  Legal Consequences of Substance Abuse: none  Family Consequences of Substance Abuse: none  Blackouts:  No DT's:  No Withdrawal Symptoms:  No None  Social History: Current Place of Residence: [place] [address] of Birth: [place] Family Members: Mother sister and niece. 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 Legal History: none Hobbies/Interests: TV  Family History:   Family History  Problem Relation Age of Onset  . Diabetes Mother   . Hypertension Mother   . Hyperlipidemia Mother   . Neuropathy Mother   . Diabetes Maternal Grandmother   . Hypertension Maternal Grandmother   . Heart disease Maternal Grandmother     CHF  . COPD Maternal Grandmother   . Cirrhosis Maternal Grandmother   . Anxiety disorder Maternal Grandmother   . Cancer Maternal Grandfather     lung  . Diabetes Paternal Grandmother   . Diabetes Paternal Grandfather   .  Aneurysm Paternal Grandfather   . Anxiety disorder Paternal Grandfather   . Stroke Other   . Drug abuse Father   . ADD / ADHD Brother   . Anxiety disorder Maternal Uncle   . Anxiety disorder Paternal Uncle   . Depression Paternal Uncle     Mental Status Examination/Evaluation: Objective:  Appearance: Casual and Well Groomed  Eye Contact::  Good  Speech:  Clear and Coherent  Volume:  Normal  Mood:  Anxious   Affect:  Congruent  Thought Process:  Goal Directed  Orientation:  Full (Time, Place, and Person)  Thought Content:  Rumination  Suicidal Thoughts:  No  Homicidal Thoughts:  No  Judgement:  Good  Insight:  Good  Psychomotor Activity:  Normal  Akathisia:  No  Handed:  Right  AIMS (if indicated):    Assets:  Communication Skills Desire for Improvement Resilience Social Support Talents/Skills Vocational/Educational    Laboratory/X-Ray Psychological Evaluation(s)   Reviewed in chart     Assessment:  Axis I: Generalized Anxiety Disorder  AXIS I Generalized Anxiety Disorder  AXIS II Deferred  AXIS III Past Medical History  Diagnosis Date  . Abscess     right buttocks/thigh  . DUB (dysfunctional uterine bleeding) 05/25/2012  . Polycystic ovaries      AXIS IV other psychosocial or environmental problems  AXIS V 51-60 moderate symptoms   Treatment Plan/Recommendations:  Plan of Care: Medication management   Laboratory:    Psychotherapy: The patient will start therapy here   Medications: Since Wellbutrin is not really helpful for anxiety, the patient will switch to Lexapro 20 mg daily. She will also increase clonazepam to 1 mg 3 times a day as needed for anxiety symptoms   Routine PRN Medications:  Yes  Consultations:   Safety Concerns:  She denies thoughts of hurting self or others   Other: She will return in 4 weeks     [name], MD 1/8/20163:41 PM

## 2014-02-15 ENCOUNTER — Ambulatory Visit (INDEPENDENT_AMBULATORY_CARE_PROVIDER_SITE_OTHER): Payer: BLUE CROSS/BLUE SHIELD | Admitting: Psychiatry

## 2014-02-15 ENCOUNTER — Encounter (HOSPITAL_COMMUNITY): Payer: Self-pay | Admitting: Psychiatry

## 2014-02-15 DIAGNOSIS — F411 Generalized anxiety disorder: Secondary | ICD-10-CM

## 2014-02-15 NOTE — Patient Instructions (Signed)
Discussed orally 

## 2014-02-15 NOTE — Progress Notes (Signed)
Patient:   Kathleen Velasquez   DOB:   08/27/1990  MR Number:  [id number]  Location:  [address], [place], [place] [address]  Date of Service:   Thursday 02/15/2014  Start Time:   2:05 PM End Time:   3:05 PM  Provider/Observer:  Florencia ReasonsPeggy Myrakle Wingler, MSW, LCSW  Billing Code/Service:  [phone]  Chief Complaint:     Chief Complaint  Patient presents with  . Stress  . Anxiety    Reason for Service:  Patient is referred for services by psychiatrist Dr. Tenny Crawoss due to experiencing symptoms of anxiety. Patient reports issues with anxiety, concentration, and sleeping. Patient also experiences depression which started about 1 1/2 years ago per her report.  Patient and fiancee decided they wanted to have a family in 2013. They were unable to conceive. Patient states becoming depressed when she was diagnosed with polycystic ovarian syndrome.and was informed she could not ovulate on her own She was told she would not be able to have children without having in-vitro fertilization. Patient's 25 year old sister gave birth to patient's niece in 2013. Patient reports she started taking care of niece and has been raising her for the past two years. Patient and her fiancee moved into his home 2 weeks ago. Patient is having separation issues and anxiety regarding no longer residing in the home with her niece.  She also reports stress related to conflict with sister who recently made a hurtful comment about patient being unable to have children. She expresses guilt about being unable to have children but reports fiancee is very supportive. Patient reports additional stress related to her job due to having to do presentations at work as she becomes very nervous, stutters, and jumps from subject to subject. She reports difficulty staying on task and concentrating at work and at home. . Patient also reports sleep difficulty . She works third shift and has broken sleep pattern waking up every 2-3 hours.   Current Status:  Patient reports  depressed mood, anxiety, poor concentration , poor motivation, excessive worry, sleep difficulty, and tearfulness.  Reliability of Information: Information gathered from patient and medical record.   Behavioral Observation: Kathleen HawthorneBrittany R Vaca  presents as a 24 y.o.-year-old Right -handed Caucasian Female who appeared her stated age. Her dress was appropriate and she was attire was casual.  Her  manners were appropriate to the situation.  There were not any physical disabilities noted.  She displayed an appropriate level of cooperation and motivation.    Interactions:    Active   Attention:   normal  Memory:   within normal limits  Visuo-spatial:   not examined  Speech (Volume):  normal  Speech:   normal pitch  Thought Process:  Coherent and Relevant  Though Content:  Rumination  Orientation:   person, place, time/date, situation, day of week, month of year and year  Judgment:   Good  Planning:   Good  Affect:    Anxious  Mood:    Anxious and Depressed  Insight:   Good  Intelligence:   normal  Marital Status/Living: Patient was born and reared in [place]. Patient is the oldest of two siblings. Patient has a 759 year old sister and a 25 year old brother. Patient describes household in childhood. She says  dad was a big kid, and mother was the provider.  Parents separated 2 years ago. Dad resides in [place] and patient reports she hasn't spoken to him in a year. Mother resides in [place]. Patient is engaged. She and her fiancee  reside in [place]. Patient normally likes to read, watch TV, go to moves, go to dinner, and go shopping.. Patient is [facility] .  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  Past Employment:  Lobbyist, Conservation officer, nature  Substance Use:  No concerns of substance abuse are reported.   Education:   HS Graduate  Medical History:   Past Medical History  Diagnosis Date  . Abscess      right buttocks/thigh  . DUB (dysfunctional uterine bleeding) 05/25/2012  . Polycystic ovaries     Sexual History:   History  Sexual Activity  . Sexual Activity: Yes  . Birth Control/ Protection: None    Abuse/Trauma History: Denies  Psychiatric History:  Patient reports no psychiatric hospitalizations. Patient saw therapist Wilber Bihari once in 2015.  Patient reports PCP prescribed klonopin in September 2015. She began seeing psychiatrist Dr. Tenny Craw for medicating management in January 2016.  Family Med/Psych History:  Family History  Problem Relation Age of Onset  . Diabetes Mother   . Hypertension Mother   . Hyperlipidemia Mother   . Neuropathy Mother   . Diabetes Maternal Grandmother   . Hypertension Maternal Grandmother   . Heart disease Maternal Grandmother     CHF  . COPD Maternal Grandmother   . Cirrhosis Maternal Grandmother   . Anxiety disorder Maternal Grandmother   . Cancer Maternal Grandfather     lung  . Diabetes Paternal Grandmother   . Diabetes Paternal Grandfather   . Aneurysm Paternal Grandfather   . Anxiety disorder Paternal Grandfather   . Stroke Other   . Drug abuse Father   . ADD / ADHD Brother   . Anxiety disorder Maternal Uncle   . Anxiety disorder Paternal Uncle   . Depression Paternal Uncle   . Anxiety disorder Maternal Aunt     Risk of Suicide/Violence: Patient reports no suicide attempts. Patient denies past and current suicidal and homicidal ideations. Patient reports no self-injurious behaviors and  no pattern of aggression or violence.  Legal Issues:   None  Impression/DX:  Patient presents with symptoms of anxiety and depression that have been present for the past two years. She reports becoming depressed in 2013 when she was diagnosed with polycystic ovarian syndrome and was informed she was unable to ovulate on her own. Since that time, she has continued to experience symptoms of depression and anxiety. Current symptoms include  depressed mood, anxiety, poor concentration , poor motivation, excessive worry, sleep difficulty, and tearfulness.. Diagnoses: Generalized Anxiety Disorder, Depressive Disorder NOS  Disposition/Plan:  The patient attends the assessment appointment today. Confidentiality and limits were discussed. The patient agrees to return for an appointment in one to 2 weeks for continuing assessment and treatment planning. Patient also agrees to call this practice, call 911, or have someone take her to the emergency room should symptoms worsen.  Diagnosis:    Axis I:  Generalized anxiety disorder      Axis II: No diagnosis       Axis III:  Past Medical History  Diagnosis Date  . Abscess     right buttocks/thigh  . DUB (dysfunctional uterine bleeding) 05/25/2012  . Polycystic ovaries   *      Axis IV:  occupational problems          Axis V:  51-60 moderate symptoms          Corrin Hingle, LCSW

## 2014-02-16 ENCOUNTER — Ambulatory Visit (INDEPENDENT_AMBULATORY_CARE_PROVIDER_SITE_OTHER): Payer: BLUE CROSS/BLUE SHIELD | Admitting: Psychiatry

## 2014-02-16 ENCOUNTER — Encounter (HOSPITAL_COMMUNITY): Payer: Self-pay | Admitting: Psychiatry

## 2014-02-16 VITALS — BP 135/75 | HR 90 | Ht 64.0 in | Wt 228.0 lb

## 2014-02-16 DIAGNOSIS — F411 Generalized anxiety disorder: Secondary | ICD-10-CM | POA: Diagnosis not present

## 2014-02-16 MED ORDER — LISDEXAMFETAMINE DIMESYLATE 30 MG PO CAPS: 30.0000 mg | ORAL_CAPSULE | ORAL | Status: DC

## 2014-02-16 MED ORDER — ESCITALOPRAM OXALATE 20 MG PO TABS: 20.0000 mg | ORAL_TABLET | Freq: Every day | ORAL | Status: DC

## 2014-02-16 NOTE — Progress Notes (Signed)
Patient ID: Kathleen Velasquez, female   DOB: 1990/04/03, 24 y.o.   MRN: [id number]  Psychiatric Assessment Adult  Patient Identification:  Kathleen Velasquez Date of Evaluation:  02/16/2014 Chief Complaint: Anxiety History of Chief Complaint:   Chief Complaint  Patient presents with  . Depression  . Anxiety  . Follow-up    Anxiety Symptoms include decreased concentration and nervous/anxious behavior.     this patient is a 24 year old single white female who lives with her fianc, her mother, 30 years old sister and her sister's 29-year-old daughter in [place]. She works at [employer] in the third shift.  The patient was referred by Dr. Dwana Melena, her primary physician for further assessment of anxiety.  The patient states that she's been having problems with anxiety and some depression since approximately March 2015. She and her fianc been together for several years and they want to have a baby together. However she's had significant problems with heavy menstrual bleeding. She's been diagnosed with polycystic ovary disease. The bleeding has gotten so bad that her recent visit with her via gynecologist recommended hysterectomy. She's been dealing with all of this since March and is been very depressed and despondent about it. Her primary physician is started her on Wellbutrin which has not helped.  The patient is also under stress regarding her 79-year-old niece. She states that her 53 year old sister, the niece's mother, does not seem at all responsible for taking care of a child so she and her mother do all the childcare and also the financial responsibilities for the child. She feels overwhelmed by all of this plus her job. She does not sleep well and never has. She is also extremely anxious and when she has to do presentations at work she stutters and has panic attacks. She often has trouble focusing and goes from thing to thing without finishing. She denies suicidal  ideation and her energy is fairly good. She was starting treatment at [facility] but has not been able to get a return phone call for follow-up visits. She's had no prior psychiatric treatment and no history of substance abuse.  The patient returns after four-week's. Her mood has been much improved since she had on Lexapro. The clonazepam is helping her anxiety particularly when she has to make presentations at work. She also states that she and her fianc moved out on their own. This is forcing her sister to take care of her own child which is good for everybody. She does report that she's having a lot of trouble focusing and has had difficulties with this her whole life. Her brother has ADHD and takes Vyvanse. I think this would be a good idea for her. She is currently on phentermine for weight loss and she cannot take them both together. She will be stopping the phentermine on the 18th and after this she can start Vyvanse Review of Systems  Constitutional: Negative.   HENT: Negative.   Eyes: Negative.   Respiratory: Negative.   Cardiovascular: Negative.   Gastrointestinal: Negative.   Endocrine: Negative.   Genitourinary: Positive for vaginal bleeding and pelvic pain.  Musculoskeletal: Positive for back pain.  Allergic/Immunologic: Negative.   Neurological: Negative.   Hematological: Negative.   Psychiatric/Behavioral: Positive for sleep disturbance, dysphoric mood and decreased concentration. The patient is nervous/anxious.    Physical Exam not done  Depressive Symptoms: depressed mood, anhedonia, insomnia, hopelessness, anxiety, panic attacks,  (Hypo) Manic Symptoms:   Elevated Mood:  No Irritable Mood:  Yes Grandiosity:  No Distractibility:  Yes Labiality of Mood:  No Delusions:  No Hallucinations:  No Impulsivity:  No Sexually Inappropriate Behavior:  No Financial Extravagance:  No Flight of Ideas:  No  Anxiety Symptoms: Excessive Worry:  Yes Panic Symptoms:   Yes Agoraphobia:  No Obsessive Compulsive: No  Symptoms: None, Specific Phobias:  No Social Anxiety:  Yes  Psychotic Symptoms:  Hallucinations: No None Delusions:  No Paranoia:  No   Ideas of Reference:  No  PTSD Symptoms: Ever had a traumatic exposure:  No Had a traumatic exposure in the last month:  No Re-experiencing: No None Hypervigilance:  No Hyperarousal: No None Avoidance: No None  Traumatic Brain Injury: No   Past Psychiatric History: Diagnosis: Generalized anxiety disorder   Hospitalizations: None   Outpatient Care: Has only seen the therapist a couple of times   Substance Abuse Care: none  Self-Mutilation: none  Suicidal Attempts: none  Violent Behaviors: none   Past Medical History:   Past Medical History  Diagnosis Date  . Abscess     right buttocks/thigh  . DUB (dysfunctional uterine bleeding) 05/25/2012  . Polycystic ovaries    History of Loss of Consciousness:  No Seizure History:  No Cardiac History:  No Allergies:   Allergies  Allergen Reactions  . Bactrim Hives and Itching  . Ciprofloxacin Hives and Itching    CRNA Discontinued preop antibiotic  IVPB  Cipro in OR after developed itching and hives left arm.  Received Benadryl with relief noted. Dr.Gonzalez and Dr. Leticia Penna informed. DDallasRN  . Other Other (See Comments)    [place] causes a hives, swelling, itching, and anaphylaxis.   Marland Kitchen Penicillins Hives and Itching  . Percocet [Oxycodone-Acetaminophen] Other (See Comments)    Face flushes   Current Medications:  Current Outpatient Prescriptions  Medication Sig Dispense Refill  . acetaminophen (TYLENOL) 500 MG tablet Take 1,000 mg by mouth every 6 (six) hours as needed for moderate pain.    . clonazePAM (KLONOPIN) 1 MG tablet Take 1 tablet (1 mg total) by mouth 3 (three) times daily as needed for anxiety. 90 tablet 2  . cyclobenzaprine (FLEXERIL) 10 MG tablet Take 1 tablet (10 mg total) by mouth 2 (two) times daily as needed for muscle  spasms. 20 tablet 0  . escitalopram (LEXAPRO) 20 MG tablet Take 1 tablet (20 mg total) by mouth daily. 30 tablet 2  . estradiol (ESTRACE) 1 MG tablet Take 1 tablet (1 mg total) by mouth 2 (two) times daily. On days of breakthrough bleeding, up to a week in a row (Patient taking differently: Take 1 mg by mouth 2 (two) times daily as needed. On days of breakthrough bleeding, up to a week in a row) 30 tablet 2  . etonogestrel-ethinyl estradiol (NUVARING) 0.12-0.015 MG/24HR vaginal ring Insert vaginally and leave in place for 3 consecutive weeks, then remove for 1 week. 1 each 12  . ibuprofen (ADVIL,MOTRIN) 200 MG tablet Take 600 mg by mouth every 6 (six) hours as needed for moderate pain.    . phentermine 37.5 MG capsule Take 37.5 mg by mouth every morning.    . lisdexamfetamine (VYVANSE) 30 MG capsule Take 1 capsule (30 mg total) by mouth every morning. 30 capsule 0   No current facility-administered medications for this visit.    Previous Psychotropic Medications:  Medication Dose   Currently on Wellbutrin XL 150 g every morning and clonazepam 0.5 mg 3 times a day  Substance Abuse History in the last 12 months: Substance Age of 1st Use Last Use Amount Specific Type  Nicotine    smokes one half pack of cigarettes per day    Alcohol      Cannabis      Opiates      Cocaine      Methamphetamines      LSD      Ecstasy      Benzodiazepines      Caffeine      Inhalants      Others:                          Medical Consequences of Substance Abuse: none  Legal Consequences of Substance Abuse: none  Family Consequences of Substance Abuse: none  Blackouts:  No DT's:  No Withdrawal Symptoms:  No None  Social History: [place] Residence: [place] [address] [place] Place of Birth: [place] Family Members: Mother sister and niece. 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 Legal History: none Hobbies/Interests: TV  Family History:   Family History  Problem Relation Age of Onset  . Diabetes Mother   . Hypertension Mother   . Hyperlipidemia Mother   . Neuropathy Mother   . Diabetes Maternal Grandmother   . Hypertension Maternal Grandmother   . Heart disease Maternal Grandmother     CHF  . COPD Maternal Grandmother   . Cirrhosis Maternal Grandmother   . Anxiety disorder Maternal Grandmother   . Cancer Maternal Grandfather     lung  . Diabetes Paternal Grandmother   . Diabetes Paternal Grandfather   . Aneurysm Paternal Grandfather   . Anxiety disorder Paternal Grandfather   . Stroke Other   . Drug abuse Father   . ADD / ADHD Brother   . Anxiety disorder Maternal Uncle   . Anxiety disorder Paternal Uncle   . Depression Paternal Uncle   . Anxiety disorder Maternal Aunt     Mental Status Examination/Evaluation: Objective:  Appearance: Casual and Well Groomed  Eye Contact::  Good  Speech:  Clear and Coherent  Volume:  Normal  Mood:good  Affect:  bright  Thought Process:  Goal Directed  Orientation:  Full (Time, Place, and Person)  Thought Content:  Rumination  Suicidal Thoughts:  No  Homicidal Thoughts:  No  Judgement:  Good  Insight:  Good  Psychomotor Activity:  Normal  Akathisia:  No  Handed:  Right  AIMS (if indicated):    Assets:  Communication Skills Desire for Improvement Resilience Social Support Talents/Skills Vocational/Educational    Laboratory/X-Ray Psychological Evaluation(s)   Reviewed in chart     Assessment:  Axis I: Generalized Anxiety Disorder  AXIS I Generalized Anxiety Disorder  AXIS II Deferred  AXIS III Past Medical History   Diagnosis Date  . Abscess     right buttocks/thigh  . DUB (dysfunctional uterine bleeding) 05/25/2012  . Polycystic ovaries      AXIS IV other psychosocial or environmental problems  AXIS V 51-60 moderate symptoms   Treatment Plan/Recommendations:  Plan of Care: Medication management   Laboratory:    Psychotherapy: The patient will start therapy here   Medications: , the patient  will continue to Lexapro 20 mg daily. She will also continue clonazepam to 1 mg 3 times a day as needed for anxiety symptoms she'll start Vyvanse 30 mg every morning for difficulties with focus   Routine PRN Medications:  Yes  Consultations:   Safety Concerns:  She denies thoughts of hurting self or others   Other: She will return in 4 weeks     [name], MD 2/5/20162:20 PM

## 2014-02-20 ENCOUNTER — Telehealth (HOSPITAL_COMMUNITY): Payer: Self-pay | Admitting: *Deleted

## 2014-02-20 NOTE — Telephone Encounter (Signed)
phone call from patient, said pharmacy said insurance need to know why she is taking the Vyvanse 30 mg.

## 2014-02-20 NOTE — Telephone Encounter (Signed)
Please route to [place] to call pharmacy 

## 2014-02-21 NOTE — Telephone Encounter (Signed)
Called pt pharmacy to get more information. Will be faxed prior auth information and will be working on it

## 2014-02-28 ENCOUNTER — Telehealth (HOSPITAL_COMMUNITY): Payer: Self-pay | Admitting: *Deleted

## 2014-02-28 NOTE — Telephone Encounter (Signed)
lmtcb in reference to her medication. Number provided.

## 2014-03-02 ENCOUNTER — Ambulatory Visit (HOSPITAL_COMMUNITY): Payer: Self-pay | Admitting: Psychiatry

## 2014-03-02 ENCOUNTER — Telehealth (HOSPITAL_COMMUNITY): Payer: Self-pay | Admitting: *Deleted

## 2014-03-02 NOTE — Telephone Encounter (Addendum)
Pt insurance company is refusing to pay for her Vyvanse even with Prior Auth. Per pt insurance company, they need pt to try something off of the formulary list. Pt states she would like to try Vyvanse. Pt number is [phone].

## 2014-03-04 ENCOUNTER — Emergency Department (HOSPITAL_COMMUNITY)
Admission: EM | Admit: 2014-03-04 | Discharge: 2014-03-04 | Disposition: A | Discharge status: Home / Self Care | Payer: BLUE CROSS/BLUE SHIELD | Attending: Emergency Medicine | Admitting: Emergency Medicine

## 2014-03-04 ENCOUNTER — Encounter (HOSPITAL_COMMUNITY): Payer: Self-pay

## 2014-03-04 DIAGNOSIS — L02412 Cutaneous abscess of left axilla: Secondary | ICD-10-CM | POA: Diagnosis present

## 2014-03-04 DIAGNOSIS — Z8742 Personal history of other diseases of the female genital tract: Secondary | ICD-10-CM | POA: Insufficient documentation

## 2014-03-04 DIAGNOSIS — Z880 Allergy status to penicillin: Secondary | ICD-10-CM | POA: Insufficient documentation

## 2014-03-04 DIAGNOSIS — Z720 Tobacco use: Secondary | ICD-10-CM | POA: Diagnosis not present

## 2014-03-04 DIAGNOSIS — Z8639 Personal history of other endocrine, nutritional and metabolic disease: Secondary | ICD-10-CM | POA: Insufficient documentation

## 2014-03-04 DIAGNOSIS — L732 Hidradenitis suppurativa: Secondary | ICD-10-CM

## 2014-03-04 DIAGNOSIS — M722 Plantar fascial fibromatosis: Secondary | ICD-10-CM

## 2014-03-04 DIAGNOSIS — B070 Plantar wart: Secondary | ICD-10-CM

## 2014-03-04 DIAGNOSIS — Z79899 Other long term (current) drug therapy: Secondary | ICD-10-CM | POA: Insufficient documentation

## 2014-03-04 HISTORY — DX: Hidradenitis suppurativa: L73.2

## 2014-03-04 MED ORDER — CLINDAMYCIN HCL 150 MG PO CAPS
300.0000 mg | ORAL_CAPSULE | Freq: Once | ORAL | Status: AC
  Administered 2014-03-04: 300 mg via ORAL
  Filled 2014-03-04: qty 2

## 2014-03-04 MED ORDER — CLINDAMYCIN HCL 300 MG PO CAPS: 300.0000 mg | ORAL_CAPSULE | Freq: Four times a day (QID) | ORAL | Status: DC

## 2014-03-04 MED ORDER — HYDROCODONE-ACETAMINOPHEN 5-325 MG PO TABS: ORAL_TABLET | ORAL | Status: DC

## 2014-03-04 MED ORDER — HYDROCODONE-ACETAMINOPHEN 5-325 MG PO TABS
1.0000 | ORAL_TABLET | Freq: Once | ORAL | Status: AC
  Administered 2014-03-04: 1 via ORAL
  Filled 2014-03-04: qty 1

## 2014-03-04 NOTE — Discharge Instructions (Signed)
Hidradenitis Suppurativa, Sweat Gland Abscess Hidradenitis suppurativa is a long lasting (chronic), uncommon disease of the sweat glands. With this, boil-like lumps and scarring develop in the groin, some times under the arms (axillae), and under the breasts. It may also uncommonly occur behind the ears, in the crease of the buttocks, and around the genitals.  CAUSES  The cause is from a blocking of the sweat glands. They then become infected. It may cause drainage and odor. It is not contagious. So it cannot be given to someone else. It most often shows up in puberty (about 68 to 25 years of age). But it may happen much later. It is similar to acne which is a disease of the sweat glands. This condition is slightly more common in African-Americans and women. SYMPTOMS   Hidradenitis usually starts as one or more red, tender, swellings in the groin or under the arms (axilla).  Over a period of hours to days the lesions get larger. They often open to the skin surface, draining clear to yellow-colored fluid.  The infected area heals with scarring. DIAGNOSIS  Your caregiver makes this diagnosis by looking at you. Sometimes cultures (growing germs on plates in the lab) may be taken. This is to see what germ (bacterium) is causing the infection.  TREATMENT   Topical germ killing medicine applied to the skin (antibiotics) are the treatment of choice. Antibiotics taken by mouth (systemic) are sometimes needed when the condition is getting worse or is severe.  Avoid tight-fitting clothing which traps moisture in.  Dirt does not cause hidradenitis and it is not caused by poor hygiene.  Involved areas should be cleaned daily using an antibacterial soap. Some patients find that the liquid form of Lever 2000, applied to the involved areas as a lotion after bathing, can help reduce the odor related to this condition.  Sometimes surgery is needed to drain infected areas or remove scarred tissue. Removal of  large amounts of tissue is used only in severe cases.  Birth control pills may be helpful.  Oral retinoids (vitamin A derivatives) for 6 to 12 months which are effective for acne may also help this condition.  Weight loss will improve but not cure hidradenitis. It is made worse by being overweight. But the condition is not caused by being overweight.  This condition is more common in people who have had acne.  It may become worse under stress. There is no medical cure for hidradenitis. It can be controlled, but not cured. The condition usually continues for years with periods of getting worse and getting better (remission). Document Released: 08/13/2003 Document Revised: 03/23/2011 Document Reviewed: 03/31/2013 [facility] Patient Information 2015 [place], [place]. This information is not intended to replace advice given to you by your health care provider. Make sure you discuss any questions you have with your health care provider.  Plantar Fasciitis Plantar fasciitis is a common condition that causes foot pain. It is soreness (inflammation) of the band of tough fibrous tissue on the bottom of the foot that runs from the heel bone (calcaneus) to the ball of the foot. The cause of this soreness may be from excessive standing, poor fitting shoes, running on hard surfaces, being overweight, having an abnormal walk, or overuse (this is common in runners) of the painful foot or feet. It is also common in aerobic exercise dancers and ballet dancers. SYMPTOMS  Most people with plantar fasciitis complain of:  Severe pain in the morning on the bottom of their foot especially  when taking the first steps out of bed. This pain recedes after a few minutes of walking.  Severe pain is experienced also during walking following a long period of inactivity.  Pain is worse when walking barefoot or up stairs DIAGNOSIS   Your caregiver will diagnose this condition by examining and feeling your foot.  Special  tests such as X-rays of your foot, are usually not needed. PREVENTION   Consult a sports medicine professional before beginning a new exercise program.  Walking programs offer a good workout. With walking there is a lower chance of overuse injuries common to runners. There is less impact and less jarring of the joints.  Begin all new exercise programs slowly. If problems or pain develop, decrease the amount of time or distance until you are at a comfortable level.  Wear good shoes and replace them regularly.  Stretch your foot and the heel cords at the back of the ankle (Achilles tendon) both before and after exercise.  Run or exercise on even surfaces that are not hard. For example, asphalt is better than pavement.  Do not run barefoot on hard surfaces.  If using a treadmill, vary the incline.  Do not continue to workout if you have foot or joint problems. Seek professional help if they do not improve. HOME CARE INSTRUCTIONS   Avoid activities that cause you pain until you recover.  Use ice or cold packs on the problem or painful areas after working out.  Only take over-the-counter or prescription medicines for pain, discomfort, or fever as directed by your caregiver.  Soft shoe inserts or athletic shoes with air or gel sole cushions may be helpful.  If problems continue or become more severe, consult a sports medicine caregiver or your own health care provider. Cortisone is a potent anti-inflammatory medication that may be injected into the painful area. You can discuss this treatment with your caregiver. MAKE SURE YOU:   Understand these instructions.  Will watch your condition.  Will get help right away if you are not doing well or get worse. Document Released: 09/23/2000 Document Revised: 03/23/2011 Document Reviewed: 11/23/2007 [facility] Patient Information 2015 [place], [place]. This information is not intended to replace advice given to you by your health care provider.  Make sure you discuss any questions you have with your health care provider.

## 2014-03-04 NOTE — ED Notes (Signed)
I have a bursted abscess under my left arm that is really painful and a place on the bottom of my right foot that is also painful per pt.

## 2014-03-05 ENCOUNTER — Other Ambulatory Visit (HOSPITAL_COMMUNITY): Payer: Self-pay | Admitting: Psychiatry

## 2014-03-05 ENCOUNTER — Telehealth (HOSPITAL_COMMUNITY): Payer: Self-pay | Admitting: *Deleted

## 2014-03-05 MED ORDER — AMPHETAMINE-DEXTROAMPHET ER 20 MG PO CP24: 20.0000 mg | ORAL_CAPSULE | Freq: Every day | ORAL | Status: DC

## 2014-03-05 NOTE — ED Provider Notes (Signed)
CSN: [id number]     Arrival date & time 03/04/14  2041 History   First MD Initiated Contact with Patient 03/04/14 2122     Chief Complaint  Patient presents with  . Abscess     (Consider location/radiation/quality/duration/timing/severity/associated sxs/prior Treatment) HPI   Kathleen Velasquez is a 24 y.o. female who presents to the Emergency Department complaining of pain and drainage to the left axilla.  She states that she has a hx of hidradenitis and has surgery to both axilla in the past.  She reports improvement to the right , but continues to have reoccurrence to the left.  She notes that she felt "wetness" earlier and when she checked her arm, noticed that the previous scar had "bursted."  She denies fever, chills or redness to her arm.  Patient also c/o pain to the sole of her right foot and pain along the sides of her left foot.  Foot symptoms have been present for one week.  She denies injury.  Foot pain is worse with weight bearing.   Past Medical History  Diagnosis Date  . Abscess     right buttocks/thigh  . DUB (dysfunctional uterine bleeding) 05/25/2012  . Polycystic ovaries   . Hydradenitis    Past Surgical History  Procedure Laterality Date  . Hernia repair    . Tonsillectomy    . Adenoidectomy    . Hydradenitis excision  10/02/2011    Procedure: EXCISION HYDRADENITIS AXILLA;  Surgeon: Fabio BeringBrent C Ziegler, MD;  Location: [facility];  Service: General;  Laterality: Right;  Right Axillary Dissection  . Cholecystectomy  01/27/2012    Procedure: LAPAROSCOPIC CHOLECYSTECTOMY;  Surgeon: Fabio BeringBrent C Ziegler, MD;  Location: [facility];  Service: General;  Laterality: N/A;  . Hydradenitis excision Left 10/19/2013    Procedure: INCISION OF HIDRADENITIS SUPPURATIVA LEFT AXILLA;  Surgeon: Marlane HatcherWilliam S Bradford, MD;  Location: [facility];  Service: General;  Laterality: Left;   Family History  Problem Relation Age of Onset  . Diabetes Mother   . Hypertension Mother   . Hyperlipidemia Mother   .  Neuropathy Mother   . Diabetes Maternal Grandmother   . Hypertension Maternal Grandmother   . Heart disease Maternal Grandmother     CHF  . COPD Maternal Grandmother   . Cirrhosis Maternal Grandmother   . Anxiety disorder Maternal Grandmother   . Cancer Maternal Grandfather     lung  . Diabetes Paternal Grandmother   . Diabetes Paternal Grandfather   . Aneurysm Paternal Grandfather   . Anxiety disorder Paternal Grandfather   . Stroke Other   . Drug abuse Father   . ADD / ADHD Brother   . Anxiety disorder Maternal Uncle   . Anxiety disorder Paternal Uncle   . Depression Paternal Uncle   . Anxiety disorder Maternal Aunt    History  Substance Use Topics  . Smoking status: Current Every Day Smoker -- 0.50 packs/day for 7 years    Types: Cigarettes  . Smokeless tobacco: Never Used  . Alcohol Use: No     Comment: social use . 01-19-14 per pt no   OB History    Gravida Para Term Preterm AB TAB SAB Ectopic Multiple Living   0              Review of Systems  Constitutional: Negative for fever and chills.  Respiratory: Negative for chest tightness and shortness of breath.   Musculoskeletal: Positive for arthralgias (bilateral foot pain). Negative for joint swelling.  Skin: Negative  for color change.       Abscess left armpit with drainage  Neurological: Negative for weakness and numbness.  All other systems reviewed and are negative.     Allergies  Bactrim; Ciprofloxacin; Other; Penicillins; and Percocet  Home Medications   Prior to Admission medications   Medication Sig Start Date End Date Taking? Authorizing Provider  acetaminophen (TYLENOL) 500 MG tablet Take 1,000 mg by mouth every 6 (six) hours as needed for moderate pain.    Historical Provider, MD  amphetamine-dextroamphetamine (ADDERALL XR) 20 MG 24 hr capsule Take 1 capsule (20 mg total) by mouth daily. 03/05/14   [name], MD  clindamycin (CLEOCIN) 300 MG capsule Take 1 capsule (300 mg total) by mouth 4 (four)  times daily. For 7 days 03/04/14   Laxmi Choung L. Loa Idler, PA-C  clonazePAM (KLONOPIN) 1 MG tablet Take 1 tablet (1 mg total) by mouth 3 (three) times daily as needed for anxiety. 01/19/14 01/19/15  [name], MD  cyclobenzaprine (FLEXERIL) 10 MG tablet Take 1 tablet (10 mg total) by mouth 2 (two) times daily as needed for muscle spasms. 01/05/14   Donnetta Hutching, MD  escitalopram (LEXAPRO) 20 MG tablet Take 1 tablet (20 mg total) by mouth daily. 02/16/14 02/16/15  [name], MD  estradiol (ESTRACE) 1 MG tablet Take 1 tablet (1 mg total) by mouth 2 (two) times daily. On days of breakthrough bleeding, up to a week in a row Patient taking differently: Take 1 mg by mouth 2 (two) times daily as needed. On days of breakthrough bleeding, up to a week in a row 12/29/13   Tilda Burrow, MD  etonogestrel-ethinyl estradiol (NUVARING) 0.12-0.015 MG/24HR vaginal ring Insert vaginally and leave in place for 3 consecutive weeks, then remove for 1 week. 09/27/13   Catalina Antigua, MD  HYDROcodone-acetaminophen (NORCO/VICODIN) 5-325 MG per tablet Take one-two tabs po q 4-6 hrs prn pain 03/04/14   Charniece Venturino L. Ashyah Quizon, PA-C  ibuprofen (ADVIL,MOTRIN) 200 MG tablet Take 600 mg by mouth every 6 (six) hours as needed for moderate pain.    Historical Provider, MD  phentermine 37.5 MG capsule Take 37.5 mg by mouth every morning.    Historical Provider, MD   BP 111/63 mmHg  Pulse 85  Temp(Src) 98.6 F (37 C) (Oral)  Resp 24  Ht  (1.626 m)  Wt 228 lb (103.42 kg)  BMI 39.12 kg/m2  SpO2 100%  LMP 01/23/2014 (Approximate) Physical Exam  Constitutional: She is oriented to person, place, and time. She appears well-developed and well-nourished. No distress.  Neck: Normal range of motion. Neck supple.  Cardiovascular: Normal rate, regular rhythm, normal heart sounds and intact distal pulses.   No murmur heard. Pulmonary/Chest: Effort normal and breath sounds normal. No respiratory distress.  Musculoskeletal: Normal range of motion.  She exhibits tenderness. She exhibits no edema.  Tenderness along the medial and lateral aspects of the left hind foot.  Plantar wart to the right fore foot.  No erythema or edema.  DP pulses intact bilaterally  Lymphadenopathy:    She has no cervical adenopathy.  Neurological: She is alert and oriented to person, place, and time. Coordination normal.  Skin: Skin is warm. No erythema.  Open draining lesion with odor to the left axilla.  No abscess, no erythema.    Nursing note and vitals reviewed.   ED Course  Procedures (including critical care time) Labs Review Labs Reviewed - No data to display  Imaging Review No results found.   EKG  Interpretation None      MDM   Final diagnoses:  Hidradenitis suppurativa of left axilla  Plantar fasciitis of left foot  Plantar wart of right foot   Patient well appearing.  NV intact.  Chronic hidradenitis without abscess.  Pt agrees to warm compresses, podiatry f/u.  Pt appears stable for d/c.  rx for vicodin, clindamycin    Takara Sermons L. Rowe Robert 03/05/14 2232  Benny Lennert, MD 03/06/14 [phone]

## 2014-03-05 NOTE — Telephone Encounter (Signed)
Pt insurance company is refusing to pay for her Vyvanse even with Prior Auth. Per pt insurance company, they need pt to try something off of the formulary list. Pt states she would like to try Adderall. Pt number is [phone].

## 2014-03-05 NOTE — Telephone Encounter (Signed)
printed

## 2014-03-06 ENCOUNTER — Telehealth (HOSPITAL_COMMUNITY): Payer: Self-pay | Admitting: *Deleted

## 2014-03-06 NOTE — Telephone Encounter (Signed)
Pt came into office to pick up her printed script for Adderall. Per pt she will bring her Vyvanse script back to office due to getting the Adderall script. Pt D/L Number is [id number] expiration 01-17-2015. Pt agrees with her script.

## 2014-03-06 NOTE — Telephone Encounter (Signed)
Opened in Error.

## 2014-03-06 NOTE — Telephone Encounter (Signed)
Pt aware.

## 2014-03-13 ENCOUNTER — Ambulatory Visit (INDEPENDENT_AMBULATORY_CARE_PROVIDER_SITE_OTHER): Payer: BLUE CROSS/BLUE SHIELD | Admitting: Psychiatry

## 2014-03-13 ENCOUNTER — Ambulatory Visit (HOSPITAL_COMMUNITY): Payer: Self-pay | Admitting: Psychiatry

## 2014-03-13 DIAGNOSIS — F411 Generalized anxiety disorder: Secondary | ICD-10-CM | POA: Diagnosis not present

## 2014-03-13 NOTE — Patient Instructions (Signed)
Discussed orally 

## 2014-03-13 NOTE — Progress Notes (Signed)
   THERAPIST PROGRESS NOTE  Session Time: Tuesday 03/13/2014 1:10 PM - 1:55 PM  Participation Level: Active  Behavioral Response: CasualAlert/less anxious/less depressed  Type of Therapy: Individual Therapy  Treatment Goals addressed: Establish therapeutic alliance, improve ability to manage stress and anxiety  Interventions: Supportive  Summary: Kathleen Velasquez is a 24 y.o. female who is referred for services by psychiatrist Dr. Tenny Crawoss due to experiencing symptoms of anxiety. Patient reports issues with anxiety, concentration, and sleeping. Patient also experiences depression which started about 1 1/2 years ago per her report.  Patient and fiancee decided they wanted to have a family in 2013. They were unable to conceive. Patient states becoming depressed when she was diagnosed with polycystic ovarian syndrome.and was informed she could not ovulate on her own She was told she would not be able to have children without having in-vitro fertilization. Patient's 25 year old sister gave birth to patient's niece in 2013. Patient reports she started taking care of niece and has been raising her for the past two years. Patient and her fiancee moved into his home 2 weeks ago. Patient is having separation issues and anxiety regarding no longer residing in the home with her niece.  She also reports stress related to conflict with sister who recently made a hurtful comment about patient being unable to have children. She expresses guilt about being unable to have children but reports fiancee is very supportive. Patient reports additional stress related to her job due to having to do presentations at work as she becomes very nervous, stutters, and jumps from subject to subject. She reports difficulty staying on task and concentrating at work and at home. . Patient also reports sleep difficulty . She works third shift and has broken sleep pattern waking up every 2-3 hours.   Patient reports decreased anxiety and  improved mood since last session. She rates anxiety at 4 and depression at 5 on 10 point scale with 10 being severe. Panic attacks occur about one time per week and have decreased in intensity and duration. She reports improved concentration since taking medication as prescribed by Dr. Tenny Crawoss but reports it doesn't seem to become effective until about 3 hours after she has taken it. She will discuss with Dr. Tenny Crawoss during appointment on 02/14/2014. 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 She states working another job to help out financially while her fiancee starts his Arts administratorautomotive business. She also wants to determine if she she wants to continue using CNA certification or pursue LPN degree. She reports initially being stressed when starting second job but is feeling better now that she understands her job. She reports improved sleep pattern. She continues to miss her niece but is seeing her periodically. She is adjusting to being the aunt rather than being the mother.     Suicidal/Homicidal: No  Therapist Response:  Therapist works with patient to identify and verbalize feelings, praise patient's efforts to set boundaries, identify ways to achieve balance and improve self-care  Plan: Return again in 2 weeks.  Diagnosis: Axis I: GAD    Axis II: No diagnosis    Kathleen Mullenbach, LCSW 03/13/2014

## 2014-03-15 ENCOUNTER — Ambulatory Visit (INDEPENDENT_AMBULATORY_CARE_PROVIDER_SITE_OTHER): Payer: BLUE CROSS/BLUE SHIELD | Admitting: Psychiatry

## 2014-03-15 ENCOUNTER — Encounter (HOSPITAL_COMMUNITY): Payer: Self-pay | Admitting: Psychiatry

## 2014-03-15 VITALS — BP 119/69 | HR 75 | Ht 64.0 in | Wt 225.0 lb

## 2014-03-15 DIAGNOSIS — F411 Generalized anxiety disorder: Secondary | ICD-10-CM

## 2014-03-15 MED ORDER — ESCITALOPRAM OXALATE 20 MG PO TABS: 20.0000 mg | ORAL_TABLET | Freq: Every day | ORAL | Status: DC

## 2014-03-15 MED ORDER — CLONAZEPAM 1 MG PO TABS: 1.0000 mg | ORAL_TABLET | Freq: Three times a day (TID) | ORAL | Status: DC | PRN

## 2014-03-15 MED ORDER — AMPHETAMINE-DEXTROAMPHETAMINE 20 MG PO TABS: 20.0000 mg | ORAL_TABLET | Freq: Two times a day (BID) | ORAL | Status: DC

## 2014-03-15 NOTE — Progress Notes (Signed)
Patient ID: Kathleen Velasquez, female   DOB: January 26, 1990, 24 y.o.   MRN: [id number] Patient ID: Kathleen Velasquez, female   DOB: [birth year], 24 y.o.   MRN: [id number]  Psychiatric Assessment Adult  Patient Identification:  Kathleen Velasquez Date of Evaluation:  03/15/2014 Chief Complaint: Anxiety History of Chief Complaint:   Chief Complaint  Patient presents with  . Depression  . ADHD  . Follow-up    Anxiety Symptoms include decreased concentration and nervous/anxious behavior.     this patient is a 24 year old single white female who lives with her fianc, her mother, 51 years old sister and her sister's 23-year-old daughter in [place]. She works at [employer] in the third shift.  The patient was referred by Dr. Dwana Melena, her primary physician for further assessment of anxiety.  The patient states that she's been having problems with anxiety and some depression since approximately March 2015. She and her fianc been together for several years and they want to have a baby together. However she's had significant problems with heavy menstrual bleeding. She's been diagnosed with polycystic ovary disease. The bleeding has gotten so bad that her recent visit with her via gynecologist recommended hysterectomy. She's been dealing with all of this since March and is been very depressed and despondent about it. Her primary physician is started her on Wellbutrin which has not helped.  The patient is also under stress regarding her 84-year-old niece. She states that her 80 year old sister, the niece's mother, does not seem at all responsible for taking care of a child so she and her mother do all the childcare and also the financial responsibilities for the child. She feels overwhelmed by all of this plus her job. She does not sleep well and never has. She is also extremely anxious and when she has to do presentations at work she stutters and has panic attacks. She often has trouble  focusing and goes from thing to thing without finishing. She denies suicidal ideation and her energy is fairly good. She was starting treatment at resolution counseling but has not been able to get a return phone call for follow-up visits. She's had no prior psychiatric treatment and no history of substance abuse.  The patient returns after four-week's. Her mood is good and she is less anxious. We tried to use Vyvanse for her ADHD but her insurance would not cover it and she's taking Adderall XR 20 mg. Does not last very long and I suggested we switch to the regular Adderall 20 mg twice a day. 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 She is really not getting much sleep now but doesn't seem to mind it. She feels better since she and her fianc moved on their own Review of Systems  Constitutional: Negative.   HENT: Negative.   Eyes: Negative.   Respiratory: Negative.   Cardiovascular: Negative.   Gastrointestinal: Negative.   Endocrine: Negative.   Genitourinary: Positive for vaginal bleeding and pelvic pain.  Musculoskeletal: Positive for back pain.  Allergic/Immunologic: Negative.   Neurological: Negative.   Hematological: Negative.   Psychiatric/Behavioral: Positive for sleep disturbance, dysphoric mood and decreased concentration. The patient is nervous/anxious.    Physical Exam not done  Depressive Symptoms: depressed mood, anhedonia, insomnia, hopelessness, anxiety, panic attacks,  (Hypo) Manic Symptoms:   Elevated Mood:  No Irritable Mood:  Yes Grandiosity:  No Distractibility:  Yes Labiality of Mood:  No Delusions:  No Hallucinations:  No Impulsivity:  No Sexually Inappropriate Behavior:  No Financial Extravagance:  No Flight of Ideas:  No  Anxiety Symptoms: Excessive Worry:  Yes Panic Symptoms:  Yes Agoraphobia:  No Obsessive Compulsive: No  Symptoms: None, Specific Phobias:  No Social Anxiety:   Yes  Psychotic Symptoms:  Hallucinations: No None Delusions:  No Paranoia:  No   Ideas of Reference:  No  PTSD Symptoms: Ever had a traumatic exposure:  No Had a traumatic exposure in the last month:  No Re-experiencing: No None Hypervigilance:  No Hyperarousal: No None Avoidance: No None  Traumatic Brain Injury: No   Past Psychiatric History: Diagnosis: Generalized anxiety disorder   Hospitalizations: None   Outpatient Care: Has only seen the therapist a couple of times   Substance Abuse Care: none  Self-Mutilation: none  Suicidal Attempts: none  Violent Behaviors: none   Past Medical History:   Past Medical History  Diagnosis Date  . Abscess     right buttocks/thigh  . DUB (dysfunctional uterine bleeding) 05/25/2012  . Polycystic ovaries   . Hydradenitis    History of Loss of Consciousness:  No Seizure History:  No Cardiac History:  No Allergies:   Allergies  Allergen Reactions  . Bactrim Hives and Itching  . Ciprofloxacin Hives and Itching    CRNA Discontinued preop antibiotic  IVPB  Cipro in OR after developed itching and hives left arm.  Received Benadryl with relief noted. Dr.Gonzalez and Dr. Leticia PennaZiegler informed. DDallasRN  . Other Other (See Comments)    [place] causes a hives, swelling, itching, and anaphylaxis.   Marland Kitchen. Penicillins Hives and Itching  . Percocet [Oxycodone-Acetaminophen] Other (See Comments)    Face flushes   Current Medications:  Current Outpatient Prescriptions  Medication Sig Dispense Refill  . acetaminophen (TYLENOL) 500 MG tablet Take 1,000 mg by mouth every 6 (six) hours as needed for moderate pain.    . clonazePAM (KLONOPIN) 1 MG tablet Take 1 tablet (1 mg total) by mouth 3 (three) times daily as needed for anxiety. 90 tablet 2  . cyclobenzaprine (FLEXERIL) 10 MG tablet Take 1 tablet (10 mg total) by mouth 2 (two) times daily as needed for muscle spasms. 20 tablet 0  . escitalopram (LEXAPRO) 20 MG tablet Take 1 tablet (20 mg total) by  mouth daily. 30 tablet 2  . estradiol (ESTRACE) 1 MG tablet Take 1 tablet (1 mg total) by mouth 2 (two) times daily. On days of breakthrough bleeding, up to a week in a row (Patient taking differently: Take 1 mg by mouth 2 (two) times daily as needed. On days of breakthrough bleeding, up to a week in a row) 30 tablet 2  . etonogestrel-ethinyl estradiol (NUVARING) 0.12-0.015 MG/24HR vaginal ring Insert vaginally and leave in place for 3 consecutive weeks, then remove for 1 week. 1 each 12  . HYDROcodone-acetaminophen (NORCO/VICODIN) 5-325 MG per tablet Take one-two tabs po q 4-6 hrs prn pain 15 tablet 0  . ibuprofen (ADVIL,MOTRIN) 200 MG tablet Take 600 mg by mouth every 6 (six) hours as needed for moderate pain.    Marland Kitchen. amphetamine-dextroamphetamine (ADDERALL) 20 MG tablet Take 1 tablet (20 mg total) by mouth 2 (two) times daily. 60 tablet 0   No current facility-administered medications for this visit.    Previous Psychotropic Medications:  Medication Dose   Currently on Wellbutrin XL 150 g every morning and clonazepam 0.5 mg 3 times a day  Substance Abuse History in the last 12 months: Substance Age of 1st Use Last Use Amount Specific Type  Nicotine    smokes one half pack of cigarettes per day    Alcohol      Cannabis      Opiates      Cocaine      Methamphetamines      LSD      Ecstasy      Benzodiazepines      Caffeine      Inhalants      Others:                          Medical Consequences of Substance Abuse: none  Legal Consequences of Substance Abuse: none  Family Consequences of Substance Abuse: none  Blackouts:  No DT's:  No Withdrawal Symptoms:  No None  Social History: Current Place of Residence: [place] [address] of Birth: [place] Family Members: Mother sister and niece. Parents are divorced and father lives in [place] with his wife and her 68 year old brother Marital Status:  Single Children:    Sons:   Daughters:  Relationships: Engaged Education:  [employer] Problems/Performance:  Religious Beliefs/Practices: none History of Abuse: none Armed forces technical officer; Armed forces training and education officer History:  None. Legal History: none Hobbies/Interests: TV  Family History:   Family History  Problem Relation Age of Onset  . Diabetes Mother   . Hypertension Mother   . Hyperlipidemia Mother   . Neuropathy Mother   . Diabetes Maternal Grandmother   . Hypertension Maternal Grandmother   . Heart disease Maternal Grandmother     CHF  . COPD Maternal Grandmother   . Cirrhosis Maternal Grandmother   . Anxiety disorder Maternal Grandmother   . Cancer Maternal Grandfather     lung  . Diabetes Paternal Grandmother   . Diabetes Paternal Grandfather   . Aneurysm Paternal Grandfather   . Anxiety disorder Paternal Grandfather   . Stroke Other   . Drug abuse Father   . ADD / ADHD Brother   . Anxiety disorder Maternal Uncle   . Anxiety disorder Paternal Uncle   . Depression Paternal Uncle   . Anxiety disorder Maternal Aunt     Mental Status Examination/Evaluation: Objective:  Appearance: Casual and Well Groomed  Eye Contact::  Good  Speech:  Clear and Coherent  Volume:  Normal  Mood:good  Affect:  bright  Thought Process:  Goal Directed  Orientation:  Full (Time, Place, and Person)  Thought Content:  Rumination  Suicidal Thoughts:  No  Homicidal Thoughts:  No  Judgement:  Good  Insight:  Good  Psychomotor Activity:  Normal  Akathisia:  No  Handed:  Right  AIMS (if indicated):    Assets:  Communication Skills Desire for Improvement Resilience Social Support Talents/Skills Vocational/Educational    Laboratory/X-Ray Psychological Evaluation(s)   Reviewed in chart     Assessment:  Axis I: Generalized Anxiety Disorder  AXIS I Generalized Anxiety Disorder  AXIS II Deferred  AXIS III Past Medical History  Diagnosis Date  . Abscess      right buttocks/thigh  . DUB (dysfunctional uterine bleeding) 05/25/2012  . Polycystic ovaries   . Hydradenitis      AXIS IV other psychosocial or environmental problems  AXIS V 51-60 moderate symptoms   Treatment Plan/Recommendations:  Plan of Care: Medication management   Laboratory:    Psychotherapy: The patient will start therapy here  Medications: , the patient will continue to Lexapro 20 mg daily. She will also continue clonazepam to 1 mg 3 times a day as needed for anxiety symptoms she'll change to Adderall 20 mg twice a day to help with ADHD symptoms   Routine PRN Medications:  Yes  Consultations:   Safety Concerns:  She denies thoughts of hurting self or others   Other: She will return in 4 weeks     [name], MD 3/3/20161:20 PM

## 2014-03-16 ENCOUNTER — Ambulatory Visit (HOSPITAL_COMMUNITY): Payer: Self-pay | Admitting: Psychiatry

## 2014-03-30 ENCOUNTER — Ambulatory Visit (HOSPITAL_COMMUNITY): Payer: Self-pay | Admitting: Psychiatry

## 2014-04-12 ENCOUNTER — Ambulatory Visit (HOSPITAL_COMMUNITY): Payer: Self-pay | Admitting: Psychiatry

## 2014-04-13 ENCOUNTER — Encounter (HOSPITAL_COMMUNITY): Payer: Self-pay | Admitting: Psychiatry

## 2014-04-13 ENCOUNTER — Ambulatory Visit (INDEPENDENT_AMBULATORY_CARE_PROVIDER_SITE_OTHER): Payer: BLUE CROSS/BLUE SHIELD | Admitting: Psychiatry

## 2014-04-13 VITALS — BP 132/67 | HR 72 | Ht 64.0 in | Wt 220.2 lb

## 2014-04-13 DIAGNOSIS — F411 Generalized anxiety disorder: Secondary | ICD-10-CM | POA: Diagnosis not present

## 2014-04-13 MED ORDER — AMPHETAMINE-DEXTROAMPHETAMINE 20 MG PO TABS: 20.0000 mg | ORAL_TABLET | Freq: Two times a day (BID) | ORAL | Status: DC

## 2014-04-13 NOTE — Progress Notes (Signed)
Patient ID: Kathleen Velasquez, female   DOB: 19-Mar-1990, 24 y.o.   MRN: [id number] Patient ID: Kathleen Velasquez, female   DOB: 05-May-1990, 24 y.o.   MRN: [id number] Patient ID: Kathleen Velasquez, female   DOB: [birth year], 24 y.o.   MRN: [id number]  Psychiatric Assessment Adult  Patient Identification:  Kathleen Velasquez Date of Evaluation:  04/13/2014 Chief Complaint: Anxiety History of Chief Complaint:   Chief Complaint  Patient presents with  . Depression  . ADHD  . Follow-up    Anxiety Symptoms include decreased concentration and nervous/anxious behavior.     this patient is a 24 year old single white female who lives with her fianc,  She works at [employer] in the third shift and also has been working in a nursing home  The patient was referred by Dr. Dwana Melena, her primary physician for further assessment of anxiety.  The patient states that she's been having problems with anxiety and some depression since approximately March 2015. She and her fianc been together for several years and they want to have a baby together. However she's had significant problems with heavy menstrual bleeding. She's been diagnosed with polycystic ovary disease. The bleeding has gotten so bad that her recent visit with her via gynecologist recommended hysterectomy. She's been dealing with all of this since March and is been very depressed and despondent about it. Her primary physician is started her on Wellbutrin which has not helped.  The patient is also under stress regarding her 67-year-old niece. She states that her 57 year old sister, the niece's mother, does not seem at all responsible for taking care of a child so she and her mother do all the childcare and also the financial responsibilities for the child. She feels overwhelmed by all of this plus her job. She does not sleep well and never has. She is also extremely anxious and when she has to do presentations at work she stutters and has  panic attacks. She often has trouble focusing and goes from thing to thing without finishing. She denies suicidal ideation and her energy is fairly good. She was starting treatment at resolution counseling but has not been able to get a return phone call for follow-up visits. She's had no prior psychiatric treatment and no history of substance abuse.  The patient returns after four-week's. 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 Working at 2 jobs is been too much. The Adderall 20 mg twice a day is working well to help her stay focused and less distracted. Her mood and anxiety are well controlled also. She is making plans to return to school online to become an LPN. She is much less stressed than she has been in the past given that she's made good decisions. Moving away from her family and switching jobs have both been helpful for her. Review of Systems  Constitutional: Negative.   HENT: Negative.   Eyes: Negative.   Respiratory: Negative.   Cardiovascular: Negative.   Gastrointestinal: Negative.   Endocrine: Negative.   Genitourinary: Positive for vaginal bleeding and pelvic pain.  Musculoskeletal: Positive for back pain.  Allergic/Immunologic: Negative.   Neurological: Negative.   Hematological: Negative.   Psychiatric/Behavioral: Positive for sleep disturbance, dysphoric mood and decreased concentration. The patient is nervous/anxious.    Physical Exam not done  Depressive Symptoms: depressed mood, anhedonia, insomnia, hopelessness, anxiety, panic attacks,  (Hypo) Manic Symptoms:   Elevated Mood:  No Irritable  Mood:  Yes Grandiosity:  No Distractibility:  Yes Labiality of Mood:  No Delusions:  No Hallucinations:  No Impulsivity:  No Sexually Inappropriate Behavior:  No Financial Extravagance:  No Flight of Ideas:  No  Anxiety Symptoms: Excessive Worry:  Yes Panic Symptoms:  Yes Agoraphobia:  No Obsessive Compulsive:  No  Symptoms: None, Specific Phobias:  No Social Anxiety:  Yes  Psychotic Symptoms:  Hallucinations: No None Delusions:  No Paranoia:  No   Ideas of Reference:  No  PTSD Symptoms: Ever had a traumatic exposure:  No Had a traumatic exposure in the last month:  No Re-experiencing: No None Hypervigilance:  No Hyperarousal: No None Avoidance: No None  Traumatic Brain Injury: No   Past Psychiatric History: Diagnosis: Generalized anxiety disorder   Hospitalizations: None   Outpatient Care: Has only seen the therapist a couple of times   Substance Abuse Care: none  Self-Mutilation: none  Suicidal Attempts: none  Violent Behaviors: none   Past Medical History:   Past Medical History  Diagnosis Date  . Abscess     right buttocks/thigh  . DUB (dysfunctional uterine bleeding) 05/25/2012  . Polycystic ovaries   . Hydradenitis    History of Loss of Consciousness:  No Seizure History:  No Cardiac History:  No Allergies:   Allergies  Allergen Reactions  . Bactrim Hives and Itching  . Ciprofloxacin Hives and Itching    CR[facility] Discontinued preop antibiotic  IVPB  Cipro in OR after developed itching and hives left arm.  Received Benadryl with relief noted. Dr.Gonzalez and Dr. Leticia Penna informed. DDallasRN  . Other Other (See Comments)    [place] causes a hives, swelling, itching, and anaphylaxis.   Marland Kitchen Penicillins Hives and Itching  . Percocet [Oxycodone-Acetaminophen] Other (See Comments)    Face flushes   Current Medications:  Current Outpatient Prescriptions  Medication Sig Dispense Refill  . acetaminophen (TYLENOL) 500 MG tablet Take 1,000 mg by mouth every 6 (six) hours as needed for moderate pain.    Marland Kitchen amphetamine-dextroamphetamine (ADDERALL) 20 MG tablet Take 1 tablet (20 mg total) by mouth 2 (two) times daily. 60 tablet 0  . clonazePAM (KLONOPIN) 1 MG tablet Take 1 tablet (1 mg total) by mouth 3 (three) times daily as needed for anxiety. 90 tablet 2  . cyclobenzaprine  (FLEXERIL) 10 MG tablet Take 1 tablet (10 mg total) by mouth 2 (two) times daily as needed for muscle spasms. 20 tablet 0  . escitalopram (LEXAPRO) 20 MG tablet Take 1 tablet (20 mg total) by mouth daily. 30 tablet 2  . estradiol (ESTRACE) 1 MG tablet Take 1 tablet (1 mg total) by mouth 2 (two) times daily. On days of breakthrough bleeding, up to a week in a row (Patient taking differently: Take 1 mg by mouth 2 (two) times daily as needed. On days of breakthrough bleeding, up to a week in a row) 30 tablet 2  . etonogestrel-ethinyl estradiol (NUVARING) 0.12-0.015 MG/24HR vaginal ring Insert vaginally and leave in place for 3 consecutive weeks, then remove for 1 week. 1 each 12  . ibuprofen (ADVIL,MOTRIN) 200 MG tablet Take 600 mg by mouth every 6 (six) hours as needed for moderate pain.    Marland Kitchen amphetamine-dextroamphetamine (ADDERALL) 20 MG tablet Take 1 tablet (20 mg total) by mouth 2 (two) times daily. 60 tablet 0   No current facility-administered medications for this visit.    Previous Psychotropic Medications:  Medication Dose   Currently on Wellbutrin XL 150 g every morning and  clonazepam 0.5 mg 3 times a day                        Substance Abuse History in the last 12 months: Substance Age of 1st Use Last Use Amount Specific Type  Nicotine    smokes one half pack of cigarettes per day    Alcohol      Cannabis      Opiates      Cocaine      Methamphetamines      LSD      Ecstasy      Benzodiazepines      Caffeine      Inhalants      Others:                          Medical Consequences of Substance Abuse: none  Legal Consequences of Substance Abuse: none  Family Consequences of Substance Abuse: none  Blackouts:  No DT's:  No Withdrawal Symptoms:  No None  Social History: [place] [address] [place] Family Members: Mother sister and niece. 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 Legal History: none Hobbies/Interests: TV  Family History:   Family History  Problem Relation Age of Onset  . Diabetes Mother   . Hypertension Mother   . Hyperlipidemia Mother   . Neuropathy Mother   . Diabetes Maternal Grandmother   . Hypertension Maternal Grandmother   . Heart disease Maternal Grandmother     CHF  . COPD Maternal Grandmother   . Cirrhosis Maternal Grandmother   . Anxiety disorder Maternal Grandmother   . Cancer Maternal Grandfather     lung  . Diabetes Paternal Grandmother   . Diabetes Paternal Grandfather   . Aneurysm Paternal Grandfather   . Anxiety disorder Paternal Grandfather   . Stroke Other   . Drug abuse Father   . ADD / ADHD Brother   . Anxiety disorder Maternal Uncle   . Anxiety disorder Paternal Uncle   . Depression Paternal Uncle   . Anxiety disorder Maternal Aunt     Mental Status Examination/Evaluation: Objective:  Appearance: Casual and Well Groomed  Eye Contact::  Good  Speech:  Clear and Coherent  Volume:  Normal  Mood:good  Affect:  bright  Thought Process:  Goal Directed  Orientation:  Full (Time, Place, and Person)  Thought Content:  Rumination  Suicidal Thoughts:  No  Homicidal Thoughts:  No  Judgement:  Good  Insight:  Good  Psychomotor Activity:  Normal  Akathisia:  No  Handed:  Right  AIMS (if indicated):    Assets:  Communication Skills Desire for Improvement Resilience Social Support Talents/Skills Vocational/Educational    Laboratory/X-Ray Psychological Evaluation(s)   Reviewed in chart     Assessment:  Axis I: Generalized Anxiety Disorder  AXIS I Generalized Anxiety Disorder  AXIS  II Deferred  AXIS III Past Medical History  Diagnosis Date  . Abscess     right buttocks/thigh  . DUB (dysfunctional uterine bleeding) 05/25/2012  . Polycystic ovaries   . Hydradenitis      AXIS IV other psychosocial or environmental problems  AXIS V 51-60 moderate symptoms   Treatment Plan/Recommendations:  Plan of Care: Medication management   Laboratory:    Psychotherapy: The patient will start therapy here   Medications: , the patient will continue  Lexapro 20 mg daily. She will also continue clonazepam to 1 mg 3 times a day as needed for anxiety symptoms and Adderall 20 mg twice a day to help with ADHD symptoms   Routine PRN Medications:  Yes  Consultations:   Safety Concerns:  She denies thoughts of hurting self or others   Other: She will return in 2 months     [name], MD 4/1/20168:51 AM

## 2014-04-17 ENCOUNTER — Ambulatory Visit (HOSPITAL_COMMUNITY): Payer: Self-pay | Admitting: Psychiatry

## 2014-05-13 ENCOUNTER — Emergency Department (HOSPITAL_COMMUNITY)
Admission: EM | Admit: 2014-05-13 | Discharge: 2014-05-13 | Disposition: A | Discharge status: Home / Self Care | Payer: BLUE CROSS/BLUE SHIELD | Attending: Emergency Medicine | Admitting: Emergency Medicine

## 2014-05-13 ENCOUNTER — Encounter (HOSPITAL_COMMUNITY): Payer: Self-pay | Admitting: *Deleted

## 2014-05-13 DIAGNOSIS — Z8742 Personal history of other diseases of the female genital tract: Secondary | ICD-10-CM | POA: Diagnosis not present

## 2014-05-13 DIAGNOSIS — Z880 Allergy status to penicillin: Secondary | ICD-10-CM | POA: Diagnosis not present

## 2014-05-13 DIAGNOSIS — Z720 Tobacco use: Secondary | ICD-10-CM | POA: Insufficient documentation

## 2014-05-13 DIAGNOSIS — Z8639 Personal history of other endocrine, nutritional and metabolic disease: Secondary | ICD-10-CM | POA: Diagnosis not present

## 2014-05-13 DIAGNOSIS — L732 Hidradenitis suppurativa: Secondary | ICD-10-CM

## 2014-05-13 DIAGNOSIS — M25512 Pain in left shoulder: Secondary | ICD-10-CM | POA: Diagnosis present

## 2014-05-13 DIAGNOSIS — Z792 Long term (current) use of antibiotics: Secondary | ICD-10-CM | POA: Insufficient documentation

## 2014-05-13 DIAGNOSIS — Z79899 Other long term (current) drug therapy: Secondary | ICD-10-CM | POA: Diagnosis not present

## 2014-05-13 MED ORDER — HYDROCODONE-ACETAMINOPHEN 5-325 MG PO TABS: 1.0000 | ORAL_TABLET | ORAL | Status: DC | PRN

## 2014-05-13 MED ORDER — DOXYCYCLINE HYCLATE 100 MG PO CAPS: 100.0000 mg | ORAL_CAPSULE | Freq: Two times a day (BID) | ORAL | Status: DC

## 2014-05-13 NOTE — ED Provider Notes (Signed)
CSN: [id number]     Arrival date & time 05/13/14  0011 History   First MD Initiated Contact with Patient 05/13/14 0054     Chief Complaint  Patient presents with  . Abscess     (Consider location/radiation/quality/duration/timing/severity/associated sxs/prior Treatment) The history is provided by the patient.   Kathleen HawthorneBrittany R Velasquez is a 24 y.o. female presenting with left axilla pain secondary to chronic abscess formation with history of hidradenitis suppurativa. She has increased pain at the site and is requesting antibiotics.  She has had surgical excision of the right axilla by Dr. Suzette Velasquez with excellent results, but continues to have problems with the left axilla, despite surgical intervention by Dr. Malvin Velasquez.  She denies drainage from the site at this time.  She has had no fevers or chills. She has been using warm compresses without relief of pain.     Past Medical History  Diagnosis Date  . Abscess     right buttocks/thigh  . DUB (dysfunctional uterine bleeding) 05/25/2012  . Polycystic ovaries   . Hydradenitis    Past Surgical History  Procedure Laterality Date  . Hernia repair    . Tonsillectomy    . Adenoidectomy    . Hydradenitis excision  10/02/2011    Procedure: EXCISION HYDRADENITIS AXILLA;  Surgeon: Kathleen BeringBrent C Ziegler, MD;  Location: [facility];  Service: General;  Laterality: Right;  Right Axillary Dissection  . Cholecystectomy  01/27/2012    Procedure: LAPAROSCOPIC CHOLECYSTECTOMY;  Surgeon: Kathleen BeringBrent C Ziegler, MD;  Location: [facility];  Service: General;  Laterality: N/A;  . Hydradenitis excision Left 10/19/2013    Procedure: INCISION OF HIDRADENITIS SUPPURATIVA LEFT AXILLA;  Surgeon: Kathleen HatcherWilliam S Bradford, MD;  Location: [facility];  Service: General;  Laterality: Left;   Family History  Problem Relation Age of Onset  . Diabetes Mother   . Hypertension Mother   . Hyperlipidemia Mother   . Neuropathy Mother   . Diabetes Maternal Grandmother   . Hypertension Maternal Grandmother   .  Heart disease Maternal Grandmother     CHF  . COPD Maternal Grandmother   . Cirrhosis Maternal Grandmother   . Anxiety disorder Maternal Grandmother   . Cancer Maternal Grandfather     lung  . Diabetes Paternal Grandmother   . Diabetes Paternal Grandfather   . Aneurysm Paternal Grandfather   . Anxiety disorder Paternal Grandfather   . Stroke Other   . Drug abuse Father   . ADD / ADHD Brother   . Anxiety disorder Maternal Uncle   . Anxiety disorder Paternal Uncle   . Depression Paternal Uncle   . Anxiety disorder Maternal Aunt    History  Substance Use Topics  . Smoking status: Current Every Day Smoker -- 0.50 packs/day for 7 years    Types: Cigarettes  . Smokeless tobacco: Never Used  . Alcohol Use: No     Comment: social use . 01-19-14 per pt no   OB History    Gravida Para Term Preterm AB TAB SAB Ectopic Multiple Living   0              Review of Systems  Constitutional: Negative for fever and chills.  Respiratory: Negative for shortness of breath and wheezing.   Skin:       Negative except as mentioned in HPI.    Neurological: Negative for numbness.      Allergies  Bactrim; Ciprofloxacin; Other; Penicillins; and Percocet  Home Medications   Prior to Admission medications   Medication  Sig Start Date End Date Taking? Authorizing Provider  acetaminophen (TYLENOL) 500 MG tablet Take 1,000 mg by mouth every 6 (six) hours as needed for moderate pain.   Yes Historical Provider, MD  amphetamine-dextroamphetamine (ADDERALL) 20 MG tablet Take 1 tablet (20 mg total) by mouth 2 (two) times daily. 04/13/14 04/13/15 Yes Myrlene Broker, MD  amphetamine-dextroamphetamine (ADDERALL) 20 MG tablet Take 1 tablet (20 mg total) by mouth 2 (two) times daily. 04/13/14 04/13/15 Yes Myrlene Broker, MD  clonazePAM (KLONOPIN) 1 MG tablet Take 1 tablet (1 mg total) by mouth 3 (three) times daily as needed for anxiety. 03/15/14 03/15/15 Yes Myrlene Broker, MD  cyclobenzaprine (FLEXERIL) 10 MG tablet  Take 1 tablet (10 mg total) by mouth 2 (two) times daily as needed for muscle spasms. 01/05/14  Yes Donnetta Hutching, MD  escitalopram (LEXAPRO) 20 MG tablet Take 1 tablet (20 mg total) by mouth daily. 03/15/14 03/15/15 Yes Myrlene Broker, MD  estradiol (ESTRACE) 1 MG tablet Take 1 tablet (1 mg total) by mouth 2 (two) times daily. On days of breakthrough bleeding, up to a week in a row Patient taking differently: Take 1 mg by mouth 2 (two) times daily as needed. On days of breakthrough bleeding, up to a week in a row 12/29/13  Yes Kathleen Burrow, MD  etonogestrel-ethinyl estradiol (NUVARING) 0.12-0.015 MG/24HR vaginal ring Insert vaginally and leave in place for 3 consecutive weeks, then remove for 1 week. 09/27/13  Yes Kathleen Constant, MD  ibuprofen (ADVIL,MOTRIN) 200 MG tablet Take 600 mg by mouth every 6 (six) hours as needed for moderate pain.   Yes Historical Provider, MD  doxycycline (VIBRAMYCIN) 100 MG capsule Take 1 capsule (100 mg total) by mouth 2 (two) times daily. 05/13/14   Kathleen Amor, PA-C  HYDROcodone-acetaminophen (NORCO/VICODIN) 5-325 MG per tablet Take 1 tablet by mouth every 4 (four) hours as needed. 05/13/14   Kathleen Amor, PA-C   BP 131/80 mmHg  Pulse 72  Temp(Src) 98.1 F (36.7 C) (Oral)  Resp 18  Ht  (1.626 m)  Wt 220 lb (99.791 kg)  BMI 37.74 kg/m2  SpO2 100%  LMP 05/01/2014 Physical Exam  Constitutional: She appears well-developed and well-nourished. No distress.  HENT:  Head: Normocephalic.  Neck: Neck supple.  Cardiovascular: Normal rate.   Pulmonary/Chest: Effort normal. She has no wheezes.  Musculoskeletal: Normal range of motion. She exhibits no edema.  Skin:  Multiple areas of scarring and induration in the left axilla.  She has tenderness at a site of induration which feels very narrow and elongated in the axillary crease. No fluctuance, no erythema or red streaking.  No obvious palpable pus pocket.    ED Course  Procedures (including critical care time) Labs  Review Labs Reviewed - No data to display  Imaging Review No results found.   EKG Interpretation None      MDM   Final diagnoses:  Hidradenitis suppurativa of left axilla    Indurated area feels like post surgical scarring, although suspect there is a degree of infection present.  I do not feel I would benefit patient by I & D today.  She was placed on doxycycline and hydrocodone.  She was encouraged to follow up with pcp and/or surgeon for further care of this problem.  Pt understands and agrees with plan.    Kathleen Amor, PA-C 05/13/14 1424  Dione Booze, MD 05/14/14 [id number]

## 2014-05-13 NOTE — Discharge Instructions (Signed)
Hidradenitis Suppurativa, Sweat Gland Abscess Hidradenitis suppurativa is a long lasting (chronic), uncommon disease of the sweat glands. With this, boil-like lumps and scarring develop in the groin, some times under the arms (axillae), and under the breasts. It may also uncommonly occur behind the ears, in the crease of the buttocks, and around the genitals.  CAUSES  The cause is from a blocking of the sweat glands. They then become infected. It may cause drainage and odor. It is not contagious. So it cannot be given to someone else. It most often shows up in puberty (about 10 to 25 years of age). But it may happen much later. It is similar to acne which is a disease of the sweat glands. This condition is slightly more common in African-Americans and women. SYMPTOMS   Hidradenitis usually starts as one or more red, tender, swellings in the groin or under the arms (axilla).  Over a period of hours to days the lesions get larger. They often open to the skin surface, draining clear to yellow-colored fluid.  The infected area heals with scarring. DIAGNOSIS  Your caregiver makes this diagnosis by looking at you. Sometimes cultures (growing germs on plates in the lab) may be taken. This is to see what germ (bacterium) is causing the infection.  TREATMENT   Topical germ killing medicine applied to the skin (antibiotics) are the treatment of choice. Antibiotics taken by mouth (systemic) are sometimes needed when the condition is getting worse or is severe.  Avoid tight-fitting clothing which traps moisture in.  Dirt does not cause hidradenitis and it is not caused by poor hygiene.  Involved areas should be cleaned daily using an antibacterial soap. Some patients find that the liquid form of Lever 2000, applied to the involved areas as a lotion after bathing, can help reduce the odor related to this condition.  Sometimes surgery is needed to drain infected areas or remove scarred tissue. Removal of  large amounts of tissue is used only in severe cases.  Birth control pills may be helpful.  Oral retinoids (vitamin A derivatives) for 6 to 12 months which are effective for acne may also help this condition.  Weight loss will improve but not cure hidradenitis. It is made worse by being overweight. But the condition is not caused by being overweight.  This condition is more common in people who have had acne.  It may become worse under stress. There is no medical cure for hidradenitis. It can be controlled, but not cured. The condition usually continues for years with periods of getting worse and getting better (remission). Document Released: 08/13/2003 Document Revised: 03/23/2011 Document Reviewed: 03/31/2013 Exit[employer] Patient Information 2015 [employer]. This information is not intended to replace advice given to you by your health care provider. Make sure you discuss any questions you have with your health care provider.  

## 2014-05-13 NOTE — ED Notes (Signed)
Pt reports abscess that has become inflamed. Pt has had surgery in the past. Unable to see her PCP.

## 2014-06-07 ENCOUNTER — Emergency Department (HOSPITAL_COMMUNITY)
Admission: EM | Admit: 2014-06-07 | Discharge: 2014-06-07 | Disposition: A | Discharge status: Home / Self Care | Payer: BLUE CROSS/BLUE SHIELD | Attending: Emergency Medicine | Admitting: Emergency Medicine

## 2014-06-07 ENCOUNTER — Encounter (HOSPITAL_COMMUNITY): Payer: Self-pay

## 2014-06-07 ENCOUNTER — Emergency Department (HOSPITAL_COMMUNITY)
Admission: EM | Admit: 2014-06-07 | Discharge: 2014-06-08 | Disposition: A | Discharge status: Home / Self Care | Payer: BLUE CROSS/BLUE SHIELD | Attending: Emergency Medicine | Admitting: Emergency Medicine

## 2014-06-07 ENCOUNTER — Emergency Department (HOSPITAL_COMMUNITY): Payer: BLUE CROSS/BLUE SHIELD

## 2014-06-07 ENCOUNTER — Encounter (HOSPITAL_COMMUNITY): Payer: Self-pay | Admitting: *Deleted

## 2014-06-07 DIAGNOSIS — Z872 Personal history of diseases of the skin and subcutaneous tissue: Secondary | ICD-10-CM | POA: Insufficient documentation

## 2014-06-07 DIAGNOSIS — R51 Headache: Secondary | ICD-10-CM | POA: Diagnosis present

## 2014-06-07 DIAGNOSIS — Z79899 Other long term (current) drug therapy: Secondary | ICD-10-CM | POA: Insufficient documentation

## 2014-06-07 DIAGNOSIS — Z880 Allergy status to penicillin: Secondary | ICD-10-CM | POA: Insufficient documentation

## 2014-06-07 DIAGNOSIS — Z8639 Personal history of other endocrine, nutritional and metabolic disease: Secondary | ICD-10-CM | POA: Insufficient documentation

## 2014-06-07 DIAGNOSIS — R6884 Jaw pain: Secondary | ICD-10-CM | POA: Diagnosis present

## 2014-06-07 DIAGNOSIS — Y9289 Other specified places as the place of occurrence of the external cause: Secondary | ICD-10-CM | POA: Diagnosis not present

## 2014-06-07 DIAGNOSIS — Z720 Tobacco use: Secondary | ICD-10-CM | POA: Diagnosis not present

## 2014-06-07 DIAGNOSIS — Y998 Other external cause status: Secondary | ICD-10-CM | POA: Diagnosis not present

## 2014-06-07 DIAGNOSIS — H53149 Visual discomfort, unspecified: Secondary | ICD-10-CM | POA: Diagnosis not present

## 2014-06-07 DIAGNOSIS — N938 Other specified abnormal uterine and vaginal bleeding: Secondary | ICD-10-CM | POA: Insufficient documentation

## 2014-06-07 DIAGNOSIS — W57XXXA Bitten or stung by nonvenomous insect and other nonvenomous arthropods, initial encounter: Secondary | ICD-10-CM | POA: Insufficient documentation

## 2014-06-07 DIAGNOSIS — K029 Dental caries, unspecified: Secondary | ICD-10-CM | POA: Insufficient documentation

## 2014-06-07 DIAGNOSIS — Z8742 Personal history of other diseases of the female genital tract: Secondary | ICD-10-CM | POA: Insufficient documentation

## 2014-06-07 DIAGNOSIS — R112 Nausea with vomiting, unspecified: Secondary | ICD-10-CM | POA: Insufficient documentation

## 2014-06-07 DIAGNOSIS — R519 Headache, unspecified: Secondary | ICD-10-CM

## 2014-06-07 DIAGNOSIS — Z792 Long term (current) use of antibiotics: Secondary | ICD-10-CM | POA: Insufficient documentation

## 2014-06-07 DIAGNOSIS — Z793 Long term (current) use of hormonal contraceptives: Secondary | ICD-10-CM | POA: Insufficient documentation

## 2014-06-07 DIAGNOSIS — R42 Dizziness and giddiness: Secondary | ICD-10-CM | POA: Diagnosis not present

## 2014-06-07 DIAGNOSIS — Y9389 Activity, other specified: Secondary | ICD-10-CM | POA: Insufficient documentation

## 2014-06-07 DIAGNOSIS — S50861A Insect bite (nonvenomous) of right forearm, initial encounter: Secondary | ICD-10-CM | POA: Insufficient documentation

## 2014-06-07 MED ORDER — DIPHENHYDRAMINE HCL 50 MG/ML IJ SOLN
25.0000 mg | Freq: Once | INTRAMUSCULAR | Status: AC
  Administered 2014-06-08: 25 mg via INTRAMUSCULAR
  Filled 2014-06-07: qty 1

## 2014-06-07 MED ORDER — PROCHLORPERAZINE EDISYLATE 5 MG/ML IJ SOLN
10.0000 mg | Freq: Once | INTRAMUSCULAR | Status: AC
  Administered 2014-06-07: 10 mg via INTRAMUSCULAR
  Filled 2014-06-07: qty 2

## 2014-06-07 MED ORDER — KETOROLAC TROMETHAMINE 60 MG/2ML IM SOLN
60.0000 mg | Freq: Once | INTRAMUSCULAR | Status: AC
  Administered 2014-06-08: 60 mg via INTRAMUSCULAR
  Filled 2014-06-07: qty 2

## 2014-06-07 MED ORDER — DIPHENHYDRAMINE HCL 50 MG/ML IJ SOLN
25.0000 mg | Freq: Once | INTRAMUSCULAR | Status: AC
  Administered 2014-06-07: 25 mg via INTRAMUSCULAR
  Filled 2014-06-07: qty 1

## 2014-06-07 MED ORDER — DEXAMETHASONE SODIUM PHOSPHATE 10 MG/ML IJ SOLN
10.0000 mg | Freq: Once | INTRAMUSCULAR | Status: AC
  Administered 2014-06-07: 10 mg via INTRAMUSCULAR
  Filled 2014-06-07: qty 1

## 2014-06-07 MED ORDER — KETOROLAC TROMETHAMINE 60 MG/2ML IM SOLN
60.0000 mg | Freq: Once | INTRAMUSCULAR | Status: AC
  Administered 2014-06-07: 60 mg via INTRAMUSCULAR
  Filled 2014-06-07: qty 2

## 2014-06-07 MED ORDER — METOCLOPRAMIDE HCL 5 MG/ML IJ SOLN
10.0000 mg | Freq: Once | INTRAMUSCULAR | Status: AC
  Administered 2014-06-08: 10 mg via INTRAMUSCULAR
  Filled 2014-06-07: qty 2

## 2014-06-07 MED ORDER — DOXYCYCLINE HYCLATE 100 MG PO CAPS: 100.0000 mg | ORAL_CAPSULE | Freq: Two times a day (BID) | ORAL | Status: DC

## 2014-06-07 NOTE — ED Notes (Signed)
Pt reporting pain on right side of jaw, radiating into ear and behind eye. Woke up with pain yesterday and it got worse through the day.  Reporting some slight dizziness as well.  States that she has been taking Tylenol and Ibuprofen with no relief.

## 2014-06-07 NOTE — Discharge Instructions (Signed)
General Headache Without Cause A general headache is pain or discomfort felt around the head or neck area. The cause may not be found.  HOME CARE   Keep all doctor visits.  Only take medicines as told by your doctor.  Lie down in a dark, quiet room when you have a headache.  Keep a journal to find out if certain things bring on headaches. For example, write down:  What you eat and drink.  How much sleep you get.  Any change to your diet or medicines.  Relax by getting a massage or doing other relaxing activities.  Put ice or heat packs on the head and neck area as told by your doctor.  Lessen stress.  Sit up straight. Do not tighten (tense) your muscles.  Quit smoking if you smoke.  Lessen how much alcohol you drink.  Lessen how much caffeine you drink, or stop drinking caffeine.  Eat and sleep on a regular schedule.  Get 7 to 9 hours of sleep, or as told by your doctor.  Keep lights dim if bright lights bother you or make your headaches worse. GET HELP RIGHT AWAY IF:   Your headache becomes really bad.  You have a fever.  You have a stiff neck.  You have trouble seeing.  Your muscles are weak, or you lose muscle control.  You lose your balance or have trouble walking.  You feel like you will pass out (faint), or you pass out.  You have really bad symptoms that are different than your first symptoms.  You have problems with the medicines given to you by your doctor.  Your medicines do not work.  Your headache feels different than the other headaches.  You feel sick to your stomach (nauseous) or throw up (vomit). MAKE SURE YOU:  1. Understand these instructions. 2. Will watch your condition. 3. Will get help right away if you are not doing well or get worse. Document Released: 10/08/2007 Document Revised: 03/23/2011 Document Reviewed: 12/19/2010 [facility] Patient Information 2015 [place], [place]. This information is not intended to replace advice given  to you by your health care provider. Make sure you discuss any questions you have with your health care provider.  Tick Bite Information Ticks are insects that attach themselves to the skin and draw blood for food. There are various types of ticks. Common types include wood ticks and deer ticks. Most ticks live in shrubs and grassy areas. Ticks can climb onto your body when you make contact with leaves or grass where the tick is waiting. The most common places on the body for ticks to attach themselves are the scalp, neck, armpits, waist, and groin. Most tick bites are harmless, but sometimes ticks carry germs that cause diseases. These germs can be spread to a person during the tick's feeding process. The chance of a disease spreading through a tick bite depends on:   The type of tick.  Time of year.   How long the tick is attached.   Geographic location.  HOW CAN YOU PREVENT TICK BITES? Take these steps to help prevent tick bites when you are outdoors:  Wear protective clothing. Long sleeves and long pants are best.   Wear white clothes so you can see ticks more easily.  Tuck your pant legs into your socks.   If walking on a trail, stay in the middle of the trail to avoid brushing against bushes.  Avoid walking through areas with long grass.  Put insect repellent on  all exposed skin and along boot tops, pant legs, and sleeve cuffs.   Check clothing, hair, and skin repeatedly and before going inside.   Brush off any ticks that are not attached.  Take a shower or bath as soon as possible after being outdoors.  WHAT IS THE PROPER WAY TO REMOVE A TICK? Ticks should be removed as soon as possible to help prevent diseases caused by tick bites. 4. If latex gloves are available, put them on before trying to remove a tick.  5. Using fine-point tweezers, grasp the tick as close to the skin as possible. You may also use curved forceps or a tick removal tool. Grasp the tick as  close to its head as possible. Avoid grasping the tick on its body. 6. Pull gently with steady upward pressure until the tick lets go. Do not twist the tick or jerk it suddenly. This may break off the tick's head or mouth parts. 7. Do not squeeze or crush the tick's body. This could force disease-carrying fluids from the tick into your body.  8. After the tick is removed, wash the bite area and your hands with soap and water or other disinfectant such as alcohol. 9. Apply a small amount of antiseptic cream or ointment to the bite site.  10. Wash and disinfect any instruments that were used.  Do not try to remove a tick by applying a hot match, petroleum jelly, or fingernail polish to the tick. These methods do not work and may increase the chances of disease being spread from the tick bite.  WHEN SHOULD YOU SEEK MEDICAL CARE? Contact your health care provider if you are unable to remove a tick from your skin or if a part of the tick breaks off and is stuck in the skin.  After a tick bite, you need to be aware of signs and symptoms that could be related to diseases spread by ticks. Contact your health care provider if you develop any of the following in the days or weeks after the tick bite:  Unexplained fever.  Rash. A circular rash that appears days or weeks after the tick bite may indicate the possibility of Lyme disease. The rash may resemble a target with a bull's-eye and may occur at a different part of your body than the tick bite.  Redness and swelling in the area of the tick bite.   Tender, swollen lymph glands.   Diarrhea.   Weight loss.   Cough.   Fatigue.   Muscle, joint, or bone pain.   Abdominal pain.   Headache.   Lethargy or a change in your level of consciousness.  Difficulty walking or moving your legs.   Numbness in the legs.   Paralysis.  Shortness of breath.   Confusion.   Repeated vomiting.  Document Released: 12/27/1999 Document  Revised: 10/19/2012 Document Reviewed: 06/08/2012 [facility] Patient Information 2015 [place]. This information is not intended to replace advice given to you by your health care provider. Make sure you discuss any questions you have with your health care provider.

## 2014-06-07 NOTE — ED Provider Notes (Signed)
CSN: [id number]     Arrival date & time 06/07/14  1937 History  This chart was scribed for Gilda Crease, MD by Phillis Haggis, ED Scribe. This patient was seen in room APA07/APA07 and patient care was started at 11:07 PM.    Chief Complaint  Patient presents with  . Headache   The history is provided by the patient. A language interpreter was used.    HPI Comments: Kathleen Velasquez is a 24 y.o. female who presents to the Emergency Department complaining of headache onset one day ago. She states that she was seen here last night for the same symptoms. She states that she vomited 3 times today, the last time being at 7 PM. She states she had to leave work early due to worsening pain, dizziness, and nausea. She states that she has pulled 6 ticks off of her recently and wonders if her symptoms are attributed to these bites. She reports some redness to the bite areas but denies any spreading rash.  Past Medical History  Diagnosis Date  . Abscess     right buttocks/thigh  . DUB (dysfunctional uterine bleeding) 05/25/2012  . Polycystic ovaries   . Hydradenitis    Past Surgical History  Procedure Laterality Date  . Hernia repair    . Tonsillectomy    . Adenoidectomy    . Hydradenitis excision  10/02/2011    Procedure: EXCISION HYDRADENITIS AXILLA;  Surgeon: Fabio Bering, MD;  Location: [facility];  Service: General;  Laterality: Right;  Right Axillary Dissection  . Cholecystectomy  01/27/2012    Procedure: LAPAROSCOPIC CHOLECYSTECTOMY;  Surgeon: Fabio Bering, MD;  Location: [facility];  Service: General;  Laterality: N/A;  . Hydradenitis excision Left 10/19/2013    Procedure: INCISION OF HIDRADENITIS SUPPURATIVA LEFT AXILLA;  Surgeon: Marlane Hatcher, MD;  Location: [facility];  Service: General;  Laterality: Left;   Family History  Problem Relation Age of Onset  . Diabetes Mother   . Hypertension Mother   . Hyperlipidemia Mother   . Neuropathy Mother   . Diabetes Maternal Grandmother    . Hypertension Maternal Grandmother   . Heart disease Maternal Grandmother     CHF  . COPD Maternal Grandmother   . Cirrhosis Maternal Grandmother   . Anxiety disorder Maternal Grandmother   . Cancer Maternal Grandfather     lung  . Diabetes Paternal Grandmother   . Diabetes Paternal Grandfather   . Aneurysm Paternal Grandfather   . Anxiety disorder Paternal Grandfather   . Stroke Other   . Drug abuse Father   . ADD / ADHD Brother   . Anxiety disorder Maternal Uncle   . Anxiety disorder Paternal Uncle   . Depression Paternal Uncle   . Anxiety disorder Maternal Aunt    History  Substance Use Topics  . Smoking status: Current Every Day Smoker -- 0.50 packs/day for 7 years    Types: Cigarettes  . Smokeless tobacco: Never Used  . Alcohol Use: No     Comment: social use . 01-19-14 per pt no   OB History    Gravida Para Term Preterm AB TAB SAB Ectopic Multiple Living   0              Review of Systems  Gastrointestinal: Positive for nausea and vomiting.  Neurological: Positive for dizziness and headaches.  All other systems reviewed and are negative.  Allergies  Bactrim; Ciprofloxacin; Other; Penicillins; and Percocet  Home Medications   Prior to Admission  medications   Medication Sig Start Date End Date Taking? Authorizing Provider  acetaminophen (TYLENOL) 500 MG tablet Take 1,000 mg by mouth every 6 (six) hours as needed for moderate pain.    Historical Provider, MD  amphetamine-dextroamphetamine (ADDERALL) 20 MG tablet Take 1 tablet (20 mg total) by mouth 2 (two) times daily. 04/13/14 04/13/15  Myrlene Brokereborah R Ross, MD  amphetamine-dextroamphetamine (ADDERALL) 20 MG tablet Take 1 tablet (20 mg total) by mouth 2 (two) times daily. 04/13/14 04/13/15  Myrlene Brokereborah R Ross, MD  clonazePAM (KLONOPIN) 1 MG tablet Take 1 tablet (1 mg total) by mouth 3 (three) times daily as needed for anxiety. 03/15/14 03/15/15  Myrlene Brokereborah R Ross, MD  cyclobenzaprine (FLEXERIL) 10 MG tablet Take 1 tablet (10 mg total) by  mouth 2 (two) times daily as needed for muscle spasms. 01/05/14   Donnetta HutchingBrian Cook, MD  doxycycline (VIBRAMYCIN) 100 MG capsule Take 1 capsule (100 mg total) by mouth 2 (two) times daily. 05/13/14   Burgess AmorJulie Idol, PA-C  escitalopram (LEXAPRO) 20 MG tablet Take 1 tablet (20 mg total) by mouth daily. 03/15/14 03/15/15  Myrlene Brokereborah R Ross, MD  estradiol (ESTRACE) 1 MG tablet Take 1 tablet (1 mg total) by mouth 2 (two) times daily. On days of breakthrough bleeding, up to a week in a row Patient taking differently: Take 1 mg by mouth 2 (two) times daily as needed. On days of breakthrough bleeding, up to a week in a row 12/29/13   Tilda BurrowJohn Ferguson V, MD  etonogestrel-ethinyl estradiol (NUVARING) 0.12-0.015 MG/24HR vaginal ring Insert vaginally and leave in place for 3 consecutive weeks, then remove for 1 week. 09/27/13   Peggy Constant, MD  HYDROcodone-acetaminophen (NORCO/VICODIN) 5-325 MG per tablet Take 1 tablet by mouth every 4 (four) hours as needed. 05/13/14   Burgess AmorJulie Idol, PA-C  ibuprofen (ADVIL,MOTRIN) 200 MG tablet Take 600 mg by mouth every 6 (six) hours as needed for moderate pain.    Historical Provider, MD   BP 128/61 mmHg  Pulse 89  Temp(Src) 99.8 F (37.7 C) (Oral)  Resp 22  SpO2 99%  LMP 05/24/2014  Physical Exam  Constitutional: She is oriented to person, place, and time. She appears well-developed and well-nourished. No distress.  HENT:  Head: Normocephalic and atraumatic.  Right Ear: Hearing normal.  Left Ear: Hearing normal.  Nose: Nose normal.  Mouth/Throat: Oropharynx is clear and moist and mucous membranes are normal.  Eyes: Conjunctivae and EOM are normal. Pupils are equal, round, and reactive to light.  Neck: Normal range of motion. Neck supple.  Cardiovascular: Regular rhythm, S1 normal and S2 normal.  Exam reveals no gallop and no friction rub.   No murmur heard. Pulmonary/Chest: Effort normal and breath sounds normal. No respiratory distress. She exhibits no tenderness.  Abdominal: Soft.  Normal appearance and bowel sounds are normal. There is no hepatosplenomegaly. There is no tenderness. There is no rebound, no guarding, no tenderness at McBurney's point and negative Murphy's sign. No hernia.  Musculoskeletal: Normal range of motion.  Neurological: She is alert and oriented to person, place, and time. She has normal strength. No cranial nerve deficit or sensory deficit. Coordination normal. GCS eye subscore is 4. GCS verbal subscore is 5. GCS motor subscore is 6.  Skin: Skin is warm, dry and intact. No rash noted. No cyanosis.  Small, scattered scabs to right forearm  Psychiatric: She has a normal mood and affect. Her speech is normal and behavior is normal. Thought content normal.  Nursing note and vitals reviewed.  ED Course  Procedures (including critical care time) DIAGNOSTIC STUDIES: Oxygen Saturation is 99% on room air, normal by my interpretation.    COORDINATION OF CARE: 11:11 PM-Discussed treatment plan which includes follow up with PCP with pt at bedside and pt agreed to plan.   Labs Review Labs Reviewed - No data to display  Imaging Review Ct Head Wo Contrast  06/07/2014   CLINICAL DATA:  Headache on the right side.  EXAM: CT HEAD WITHOUT CONTRAST  TECHNIQUE: Contiguous axial images were obtained from the base of the skull through the vertex without intravenous contrast.  COMPARISON:  None  FINDINGS: Skull and Sinuses:Negative for fracture or destructive process. The mastoids, middle ears, and imaged paranasal sinuses are clear.  Orbits: No acute abnormality.  Brain: No evidence of acute infarction, hemorrhage, hydrocephalus, or mass lesion/mass effect.  IMPRESSION: Normal head CT.   Electronically Signed   By: Marnee Spring M.D.   On: 06/07/2014 05:21     EKG Interpretation None      MDM   Final diagnoses:  None   headache Tick bite  Patient returns for evaluation of headache. Patient has been seen several times in the ER for this. I saw her  myself last night. CT scan was performed, no acute abnormality. Patient continues to have no neurologic findings on examination. She tells me that she has taken 6 sticks off of her in the last several weeks and is concerned that this might be secondary to tick illness. She does not have fever. There are no rashes noted on her examination that would suggest tick illness. Patient to be treated empirically with doxycycline. Once again, she is asked to follow-up with her primary doctor.       Gilda Crease, MD 06/07/14 2340

## 2014-06-07 NOTE — ED Provider Notes (Signed)
CSN: [id number]     Arrival date & time 06/07/14  0341 History   First MD Initiated Contact with Patient 06/07/14 0401     Chief Complaint  Patient presents with  . Jaw Pain     (Consider location/radiation/quality/duration/timing/severity/associated sxs/prior Treatment) HPI Comments: Patient presents to the ER for evaluation of right-sided headache. Patient reports that symptoms began yesterday morning. She has been taking Tylenol and Motrin without relief. Initially the pain was on the right side of her face and she thought it might be from her teeth. She has not, however, identified any swelling in her mouth or teeth that are painful or tender. Pain radiates up into the ear now is the entire right side of her head. It seems centered behind her eye now. She has bilobar bright lights and loud noises. She has mild nausea, no vomiting. There has not been any fever.   Past Medical History  Diagnosis Date  . Abscess     right buttocks/thigh  . DUB (dysfunctional uterine bleeding) 05/25/2012  . Polycystic ovaries   . Hydradenitis    Past Surgical History  Procedure Laterality Date  . Hernia repair    . Tonsillectomy    . Adenoidectomy    . Hydradenitis excision  10/02/2011    Procedure: EXCISION HYDRADENITIS AXILLA;  Surgeon: Fabio BeringBrent C Ziegler, MD;  Location: [facility];  Service: General;  Laterality: Right;  Right Axillary Dissection  . Cholecystectomy  01/27/2012    Procedure: LAPAROSCOPIC CHOLECYSTECTOMY;  Surgeon: Fabio BeringBrent C Ziegler, MD;  Location: [facility];  Service: General;  Laterality: N/A;  . Hydradenitis excision Left 10/19/2013    Procedure: INCISION OF HIDRADENITIS SUPPURATIVA LEFT AXILLA;  Surgeon: Marlane HatcherWilliam S Bradford, MD;  Location: [facility];  Service: General;  Laterality: Left;   Family History  Problem Relation Age of Onset  . Diabetes Mother   . Hypertension Mother   . Hyperlipidemia Mother   . Neuropathy Mother   . Diabetes Maternal Grandmother   . Hypertension Maternal  Grandmother   . Heart disease Maternal Grandmother     CHF  . COPD Maternal Grandmother   . Cirrhosis Maternal Grandmother   . Anxiety disorder Maternal Grandmother   . Cancer Maternal Grandfather     lung  . Diabetes Paternal Grandmother   . Diabetes Paternal Grandfather   . Aneurysm Paternal Grandfather   . Anxiety disorder Paternal Grandfather   . Stroke Other   . Drug abuse Father   . ADD / ADHD Brother   . Anxiety disorder Maternal Uncle   . Anxiety disorder Paternal Uncle   . Depression Paternal Uncle   . Anxiety disorder Maternal Aunt    History  Substance Use Topics  . Smoking status: Current Every Day Smoker -- 0.50 packs/day for 7 years    Types: Cigarettes  . Smokeless tobacco: Never Used  . Alcohol Use: No     Comment: social use . 01-19-14 per pt no   OB History    Gravida Para Term Preterm AB TAB SAB Ectopic Multiple Living   0              Review of Systems  Neurological: Positive for headaches.  All other systems reviewed and are negative.     Allergies  Bactrim; Ciprofloxacin; Other; Penicillins; and Percocet  Home Medications   Prior to Admission medications   Medication Sig Start Date End Date Taking? Authorizing Provider  acetaminophen (TYLENOL) 500 MG tablet Take 1,000 mg by mouth every 6 (six)  hours as needed for moderate pain.    Historical Provider, MD  amphetamine-dextroamphetamine (ADDERALL) 20 MG tablet Take 1 tablet (20 mg total) by mouth 2 (two) times daily. 04/13/14 04/13/15  Myrlene Broker, MD  amphetamine-dextroamphetamine (ADDERALL) 20 MG tablet Take 1 tablet (20 mg total) by mouth 2 (two) times daily. 04/13/14 04/13/15  Myrlene Broker, MD  clonazePAM (KLONOPIN) 1 MG tablet Take 1 tablet (1 mg total) by mouth 3 (three) times daily as needed for anxiety. 03/15/14 03/15/15  Myrlene Broker, MD  cyclobenzaprine (FLEXERIL) 10 MG tablet Take 1 tablet (10 mg total) by mouth 2 (two) times daily as needed for muscle spasms. 01/05/14   Donnetta Hutching, MD   doxycycline (VIBRAMYCIN) 100 MG capsule Take 1 capsule (100 mg total) by mouth 2 (two) times daily. 05/13/14   Burgess Amor, PA-C  escitalopram (LEXAPRO) 20 MG tablet Take 1 tablet (20 mg total) by mouth daily. 03/15/14 03/15/15  Myrlene Broker, MD  estradiol (ESTRACE) 1 MG tablet Take 1 tablet (1 mg total) by mouth 2 (two) times daily. On days of breakthrough bleeding, up to a week in a row Patient taking differently: Take 1 mg by mouth 2 (two) times daily as needed. On days of breakthrough bleeding, up to a week in a row 12/29/13   Tilda Burrow, MD  etonogestrel-ethinyl estradiol (NUVARING) 0.12-0.015 MG/24HR vaginal ring Insert vaginally and leave in place for 3 consecutive weeks, then remove for 1 week. 09/27/13   Peggy Constant, MD  HYDROcodone-acetaminophen (NORCO/VICODIN) 5-325 MG per tablet Take 1 tablet by mouth every 4 (four) hours as needed. 05/13/14   Burgess Amor, PA-C  ibuprofen (ADVIL,MOTRIN) 200 MG tablet Take 600 mg by mouth every 6 (six) hours as needed for moderate pain.    Historical Provider, MD   BP 132/86 mmHg  Pulse 80  Temp(Src) 98.6 F (37 C) (Oral)  Resp 17  Ht  (1.626 m)  Wt 222 lb (100.699 kg)  BMI 38.09 kg/m2  SpO2 99%  LMP 05/24/2014 Physical Exam  Constitutional: She is oriented to person, place, and time. She appears well-developed and well-nourished. No distress.  HENT:  Head: Normocephalic and atraumatic.  Right Ear: Hearing normal.  Left Ear: Hearing normal.  Nose: Nose normal.  Mouth/Throat: Oropharynx is clear and moist and mucous membranes are normal.  Widespread dental decay, but no gingival swelling, no tenderness to percussion of any teeth.  Eyes: Conjunctivae and EOM are normal. Pupils are equal, round, and reactive to light.  Neck: Normal range of motion. Neck supple.  Cardiovascular: Regular rhythm, S1 normal and S2 normal.  Exam reveals no gallop and no friction rub.   No murmur heard. Pulmonary/Chest: Effort normal and breath sounds normal.  No respiratory distress. She exhibits no tenderness.  Abdominal: Soft. Normal appearance and bowel sounds are normal. There is no hepatosplenomegaly. There is no tenderness. There is no rebound, no guarding, no tenderness at McBurney's point and negative Murphy's sign. No hernia.  Musculoskeletal: Normal range of motion.  Neurological: She is alert and oriented to person, place, and time. She has normal strength. No cranial nerve deficit or sensory deficit. Coordination normal. GCS eye subscore is 4. GCS verbal subscore is 5. GCS motor subscore is 6.  Skin: Skin is warm, dry and intact. No rash noted. No cyanosis.  Psychiatric: She has a normal mood and affect. Her speech is normal and behavior is normal. Thought content normal.  Nursing note and vitals reviewed.   ED  Course  Procedures (including critical care time) Labs Review Labs Reviewed - No data to display  Imaging Review Ct Head Wo Contrast  06/07/2014   CLINICAL DATA:  Headache on the right side.  EXAM: CT HEAD WITHOUT CONTRAST  TECHNIQUE: Contiguous axial images were obtained from the base of the skull through the vertex without intravenous contrast.  COMPARISON:  None  FINDINGS: Skull and Sinuses:Negative for fracture or destructive process. The mastoids, middle ears, and imaged paranasal sinuses are clear.  Orbits: No acute abnormality.  Brain: No evidence of acute infarction, hemorrhage, hydrocephalus, or mass lesion/mass effect.  IMPRESSION: Normal head CT.   Electronically Signed   By: Marnee Spring M.D.   On: 06/07/2014 05:21     EKG Interpretation None      MDM   Final diagnoses:  Headache    Patient presents to the ER for evaluation of pain on the right side of her face and head. Patient initially thought she might be experiencing pain secondary to toothache because she has significant dental caries. I cannot, however, find any intraoral swelling and there are no teeth that are tender to percussion. Right tympanic  membrane is normal. Patient describing pain near her eye with photophobia and sound sensitivity. This sounds more consistent with migraine headache. She has not, however, have a history of migraines. Patient underwent CT scan of head to further evaluate. No acute abnormality is seen. Patient to be treated as a migraine, follow-up with primary doctor.    Gilda Crease, MD 06/07/14 [id number]

## 2014-06-07 NOTE — ED Notes (Signed)
Patient states she was seen earlier and treated and diagnosed with a migraine today. Patient states she went home and was able to sleep, now patient states her headache is coming back. Patient also states she has pulled 6 ticks off her in the past two weeks.

## 2014-06-07 NOTE — Discharge Instructions (Signed)

## 2014-06-08 NOTE — ED Notes (Signed)
Discharge instructions and prescription given and reviewed with patient.  Patient verbalized understanding to take all of antibiotic and to follow up with neurology as needed.  Patient ambulatory; discharged home in good condition.

## 2014-06-12 ENCOUNTER — Encounter (HOSPITAL_COMMUNITY): Payer: Self-pay | Admitting: Psychiatry

## 2014-06-12 ENCOUNTER — Ambulatory Visit (INDEPENDENT_AMBULATORY_CARE_PROVIDER_SITE_OTHER): Payer: BLUE CROSS/BLUE SHIELD | Admitting: Psychiatry

## 2014-06-12 VITALS — BP 126/66 | HR 89 | Ht 64.0 in | Wt 221.4 lb

## 2014-06-12 DIAGNOSIS — F411 Generalized anxiety disorder: Secondary | ICD-10-CM

## 2014-06-12 MED ORDER — CLONAZEPAM 1 MG PO TABS: 1.0000 mg | ORAL_TABLET | Freq: Three times a day (TID) | ORAL | Status: DC | PRN

## 2014-06-12 MED ORDER — AMPHETAMINE-DEXTROAMPHETAMINE 20 MG PO TABS: 20.0000 mg | ORAL_TABLET | Freq: Two times a day (BID) | ORAL | Status: DC

## 2014-06-12 MED ORDER — ESCITALOPRAM OXALATE 20 MG PO TABS: 20.0000 mg | ORAL_TABLET | Freq: Every day | ORAL | Status: DC

## 2014-06-12 NOTE — Progress Notes (Signed)
Patient ID: Kathleen Velasquez, female   DOB: Jan 02, 1991, 24 y.o.   MRN: [id number] Patient ID: Kathleen Velasquez, female   DOB: [birth year], 24 y.o.   MRN: [id number] Patient ID: Kathleen Velasquez, female   DOB: [birth year], 24 y.o.   MRN: [id number] Patient ID: Kathleen Velasquez, female   DOB: [birth year], 24 y.o.   MRN: [id number]  Psychiatric Assessment Adult  Patient Identification:  Kathleen Velasquez Date of Evaluation:  06/12/2014 Chief Complaint: Anxiety History of Chief Complaint:   Chief Complaint  Patient presents with  . ADHD  . Anxiety  . Follow-up    Anxiety Symptoms include decreased concentration and nervous/anxious behavior.     this patient is a 24 year old single white female who lives with her fianc,  She works at [employer] in the third shift and also has been working in a nursing home  The patient was referred by Dr. Dwana Melena, her primary physician for further assessment of anxiety.  The patient states that she's been having problems with anxiety and some depression since approximately March 2015. She and her fianc been together for several years and they want to have a baby together. However she's had significant problems with heavy menstrual bleeding. She's been diagnosed with polycystic ovary disease. The bleeding has gotten so bad that her recent visit with her via gynecologist recommended hysterectomy. She's been dealing with all of this since March and is been very depressed and despondent about it. Her primary physician is started her on Wellbutrin which has not helped.  The patient is also under stress regarding her 72-year-old niece. She states that her 27 year old sister, the niece's mother, does not seem at all responsible for taking care of a child so she and her mother do all the childcare and also the financial responsibilities for the child. She feels overwhelmed by all of this plus her job. She does not sleep well and never has. She is also  extremely anxious and when she has to do presentations at work she stutters and has panic attacks. She often has trouble focusing and goes from thing to thing without finishing. She denies suicidal ideation and her energy is fairly good. She was starting treatment at resolution counseling but has not been able to get a return phone call for follow-up visits. She's had no prior psychiatric treatment and no history of substance abuse.  The patient returns after 2 months. For the most part she is doing well and enjoying her job. However she had a bad episode of migraine headache the last few days. She is focusing well and doing well at her work. She states her anxiety is controlled and she is sleeping well at night and she denies being depressed Review of Systems  Constitutional: Negative.   HENT: Negative.   Eyes: Negative.   Respiratory: Negative.   Cardiovascular: Negative.   Gastrointestinal: Negative.   Endocrine: Negative.   Genitourinary: Positive for vaginal bleeding and pelvic pain.  Musculoskeletal: Positive for back pain.  Allergic/Immunologic: Negative.   Neurological: Negative.   Hematological: Negative.   Psychiatric/Behavioral: Positive for sleep disturbance, dysphoric mood and decreased concentration. The patient is nervous/anxious.    Physical Exam not done  Depressive Symptoms: depressed mood, anhedonia, insomnia, hopelessness, anxiety, panic attacks,  (Hypo) Manic Symptoms:   Elevated Mood:  No Irritable Mood:  Yes Grandiosity:  No Distractibility:  Yes Labiality of Mood:  No Delusions:  No Hallucinations:  No Impulsivity:  No Sexually Inappropriate Behavior:  No Financial Extravagance:  No Flight of Ideas:  No  Anxiety Symptoms: Excessive Worry:  Yes Panic Symptoms:  Yes Agoraphobia:  No Obsessive Compulsive: No  Symptoms: None, Specific Phobias:  No Social Anxiety:  Yes  Psychotic Symptoms:  Hallucinations: No None Delusions:  No Paranoia:  No    Ideas of Reference:  No  PTSD Symptoms: Ever had a traumatic exposure:  No Had a traumatic exposure in the last month:  No Re-experiencing: No None Hypervigilance:  No Hyperarousal: No None Avoidance: No None  Traumatic Brain Injury: No   Past Psychiatric History: Diagnosis: Generalized anxiety disorder   Hospitalizations: None   Outpatient Care: Has only seen the therapist a couple of times   Substance Abuse Care: none  Self-Mutilation: none  Suicidal Attempts: none  Violent Behaviors: none   Past Medical History:   Past Medical History  Diagnosis Date  . Abscess     right buttocks/thigh  . DUB (dysfunctional uterine bleeding) 05/25/2012  . Polycystic ovaries   . Hydradenitis    History of Loss of Consciousness:  No Seizure History:  No Cardiac History:  No Allergies:   Allergies  Allergen Reactions  . Bactrim Hives and Itching  . Ciprofloxacin Hives and Itching    CRNA Discontinued preop antibiotic  IVPB  Cipro in OR after developed itching and hives left arm.  Received Benadryl with relief noted. Dr.Gonzalez and Dr. Leticia Penna informed. DDallasRN  . Other Other (See Comments)    [place] causes a hives, swelling, itching, and anaphylaxis.   Marland Kitchen Penicillins Hives and Itching  . Percocet [Oxycodone-Acetaminophen] Other (See Comments)    Face flushes   Current Medications:  Current Outpatient Prescriptions  Medication Sig Dispense Refill  . acetaminophen (TYLENOL) 500 MG tablet Take 1,000 mg by mouth every 6 (six) hours as needed for moderate pain.    Marland Kitchen amphetamine-dextroamphetamine (ADDERALL) 20 MG tablet Take 1 tablet (20 mg total) by mouth 2 (two) times daily. 60 tablet 0  . amphetamine-dextroamphetamine (ADDERALL) 20 MG tablet Take 1 tablet (20 mg total) by mouth 2 (two) times daily. 60 tablet 0  . clonazePAM (KLONOPIN) 1 MG tablet Take 1 tablet (1 mg total) by mouth 3 (three) times daily as needed for anxiety. 90 tablet 2  . cyclobenzaprine (FLEXERIL) 10 MG tablet  Take 1 tablet (10 mg total) by mouth 2 (two) times daily as needed for muscle spasms. 20 tablet 0  . doxycycline (VIBRAMYCIN) 100 MG capsule Take 1 capsule (100 mg total) by mouth 2 (two) times daily. 20 capsule 0  . escitalopram (LEXAPRO) 20 MG tablet Take 1 tablet (20 mg total) by mouth daily. 30 tablet 2  . estradiol (ESTRACE) 1 MG tablet Take 1 tablet (1 mg total) by mouth 2 (two) times daily. On days of breakthrough bleeding, up to a week in a row (Patient taking differently: Take 1 mg by mouth 2 (two) times daily as needed. On days of breakthrough bleeding, up to a week in a row) 30 tablet 2  . etonogestrel-ethinyl estradiol (NUVARING) 0.12-0.015 MG/24HR vaginal ring Insert vaginally and leave in place for 3 consecutive weeks, then remove for 1 week. 1 each 12  . HYDROcodone-acetaminophen (NORCO/VICODIN) 5-325 MG per tablet Take 1 tablet by mouth every 4 (four) hours as needed. 20 tablet 0  . ibuprofen (ADVIL,MOTRIN) 200 MG tablet Take 600 mg by mouth every 6 (six) hours as needed for moderate pain.    Marland Kitchen amphetamine-dextroamphetamine (ADDERALL) 20 MG tablet Take 1 tablet (20 mg  total) by mouth 2 (two) times daily. 60 tablet 0   No current facility-administered medications for this visit.    Previous Psychotropic Medications:  Medication Dose   Currently on Wellbutrin XL 150 g every morning and clonazepam 0.5 mg 3 times a day                        Substance Abuse History in the last 12 months: Substance Age of 1st Use Last Use Amount Specific Type  Nicotine    smokes one half pack of cigarettes per day    Alcohol      Cannabis      Opiates      Cocaine      Methamphetamines      LSD      Ecstasy      Benzodiazepines      Caffeine      Inhalants      Others:                          Medical Consequences of Substance Abuse: none  Legal Consequences of Substance Abuse: none  Family Consequences of Substance Abuse: none  Blackouts:  No DT's:  No Withdrawal  Symptoms:  No None  Social History: Current [place] Residence: [place] [address] [place] Place of Birth: [place] Family Members: Mother sister and niece. 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 Legal History: none Hobbies/Interests: TV  Family History:   Family History  Problem Relation Age of Onset  . Diabetes Mother   . Hypertension Mother   . Hyperlipidemia Mother   . Neuropathy Mother   . Diabetes Maternal Grandmother   . Hypertension Maternal Grandmother   . Heart disease Maternal Grandmother     CHF  . COPD Maternal Grandmother   . Cirrhosis Maternal Grandmother   . Anxiety disorder Maternal Grandmother   . Cancer Maternal Grandfather     lung  . Diabetes Paternal Grandmother   . Diabetes Paternal Grandfather   . Aneurysm Paternal Grandfather   . Anxiety disorder Paternal Grandfather   . Stroke Other   . Drug abuse Father   . ADD / ADHD Brother   . Anxiety disorder Maternal Uncle   . Anxiety disorder Paternal Uncle   . Depression Paternal Uncle   . Anxiety disorder Maternal Aunt     Mental Status Examination/Evaluation: Objective:  Appearance: Casual and Well Groomed  Eye Contact::  Good  Speech:  Clear and Coherent  Volume:  Normal  Mood:good  Affect:  bright  Thought Process:  Goal Directed  Orientation:  Full (Time, Place, and Person)  Thought Content:  Rumination  Suicidal Thoughts:  No  Homicidal Thoughts:  No  Judgement:  Good  Insight:  Good  Psychomotor Activity:  Normal  Akathisia:  No  Handed:  Right  AIMS (if indicated):    Assets:  Communication Skills Desire for  Improvement Resilience Social Support Talents/Skills Vocational/Educational    Laboratory/X-Ray Psychological Evaluation(s)   Reviewed in chart     Assessment:  Axis I: Generalized Anxiety Disorder  AXIS I Generalized Anxiety Disorder  AXIS II Deferred  AXIS III Past Medical History  Diagnosis Date  . Abscess     right buttocks/thigh  . DUB (dysfunctional uterine bleeding) 05/25/2012  . Polycystic ovaries   . Hydradenitis      AXIS IV other psychosocial or environmental problems  AXIS V 51-60 moderate symptoms   Treatment Plan/Recommendations:  Plan of Care: Medication management   Laboratory:    Psychotherapy: The patient will start therapy here   Medications: , the patient will continue  Lexapro 20 mg daily for anxiety and depression She will also continue clonazepam to 1 mg 3 times a day as needed for anxiety symptoms and Adderall 20 mg twice a day to help with ADHD symptoms   Routine PRN Medications:  Yes  Consultations:   Safety Concerns:  She denies thoughts of hurting self or others   Other: She will return in 3 months     [name], MD 5/31/20163:58 PM

## 2014-06-13 ENCOUNTER — Ambulatory Visit (HOSPITAL_COMMUNITY): Payer: Self-pay | Admitting: Psychiatry

## 2014-08-29 ENCOUNTER — Emergency Department (HOSPITAL_COMMUNITY)
Admission: EM | Admit: 2014-08-29 | Discharge: 2014-08-29 | Disposition: A | Discharge status: Home / Self Care | Payer: BLUE CROSS/BLUE SHIELD | Attending: Emergency Medicine | Admitting: Emergency Medicine

## 2014-08-29 ENCOUNTER — Emergency Department (HOSPITAL_COMMUNITY)
Admission: EM | Admit: 2014-08-29 | Discharge: 2014-08-29 | Disposition: A | Discharge status: Home / Self Care | Payer: BLUE CROSS/BLUE SHIELD | Attending: Physician Assistant | Admitting: Physician Assistant

## 2014-08-29 ENCOUNTER — Encounter (HOSPITAL_COMMUNITY): Payer: Self-pay | Admitting: Emergency Medicine

## 2014-08-29 DIAGNOSIS — Z792 Long term (current) use of antibiotics: Secondary | ICD-10-CM | POA: Diagnosis not present

## 2014-08-29 DIAGNOSIS — Z720 Tobacco use: Secondary | ICD-10-CM | POA: Diagnosis not present

## 2014-08-29 DIAGNOSIS — Z793 Long term (current) use of hormonal contraceptives: Secondary | ICD-10-CM | POA: Insufficient documentation

## 2014-08-29 DIAGNOSIS — R112 Nausea with vomiting, unspecified: Secondary | ICD-10-CM | POA: Insufficient documentation

## 2014-08-29 DIAGNOSIS — Z8639 Personal history of other endocrine, nutritional and metabolic disease: Secondary | ICD-10-CM | POA: Insufficient documentation

## 2014-08-29 DIAGNOSIS — Z79899 Other long term (current) drug therapy: Secondary | ICD-10-CM | POA: Insufficient documentation

## 2014-08-29 DIAGNOSIS — Z8742 Personal history of other diseases of the female genital tract: Secondary | ICD-10-CM | POA: Insufficient documentation

## 2014-08-29 DIAGNOSIS — Z872 Personal history of diseases of the skin and subcutaneous tissue: Secondary | ICD-10-CM | POA: Insufficient documentation

## 2014-08-29 DIAGNOSIS — K088 Other specified disorders of teeth and supporting structures: Secondary | ICD-10-CM | POA: Insufficient documentation

## 2014-08-29 DIAGNOSIS — Z880 Allergy status to penicillin: Secondary | ICD-10-CM | POA: Insufficient documentation

## 2014-08-29 DIAGNOSIS — H9209 Otalgia, unspecified ear: Secondary | ICD-10-CM | POA: Diagnosis not present

## 2014-08-29 DIAGNOSIS — H65192 Other acute nonsuppurative otitis media, left ear: Secondary | ICD-10-CM | POA: Diagnosis not present

## 2014-08-29 DIAGNOSIS — H9202 Otalgia, left ear: Secondary | ICD-10-CM | POA: Diagnosis present

## 2014-08-29 MED ORDER — CLINDAMYCIN HCL 300 MG PO CAPS: 300.0000 mg | ORAL_CAPSULE | Freq: Four times a day (QID) | ORAL | Status: DC

## 2014-08-29 MED ORDER — TRAMADOL HCL 50 MG PO TABS: ORAL_TABLET | ORAL | Status: DC

## 2014-08-29 MED ORDER — ONDANSETRON HCL 4 MG PO TABS
4.0000 mg | ORAL_TABLET | Freq: Once | ORAL | Status: AC
  Administered 2014-08-29: 4 mg via ORAL
  Filled 2014-08-29: qty 1

## 2014-08-29 MED ORDER — CLINDAMYCIN HCL 150 MG PO CAPS
300.0000 mg | ORAL_CAPSULE | Freq: Once | ORAL | Status: AC
  Administered 2014-08-29: 300 mg via ORAL
  Filled 2014-08-29: qty 2

## 2014-08-29 MED ORDER — ONDANSETRON HCL 4 MG PO TABS: 4.0000 mg | ORAL_TABLET | Freq: Three times a day (TID) | ORAL | Status: DC | PRN

## 2014-08-29 MED ORDER — KETOROLAC TROMETHAMINE 30 MG/ML IJ SOLN
15.0000 mg | Freq: Once | INTRAMUSCULAR | Status: AC
  Administered 2014-08-29: 15 mg via INTRAMUSCULAR
  Filled 2014-08-29: qty 1

## 2014-08-29 MED ORDER — KETOROLAC TROMETHAMINE 60 MG/2ML IM SOLN
60.0000 mg | Freq: Once | INTRAMUSCULAR | Status: AC
  Administered 2014-08-29: 60 mg via INTRAMUSCULAR
  Filled 2014-08-29: qty 2

## 2014-08-29 MED ORDER — TRAMADOL HCL 50 MG PO TABS
100.0000 mg | ORAL_TABLET | Freq: Once | ORAL | Status: AC
  Administered 2014-08-29: 100 mg via ORAL
  Filled 2014-08-29: qty 2

## 2014-08-29 NOTE — Discharge Instructions (Signed)

## 2014-08-29 NOTE — Discharge Instructions (Signed)
Take the antibiotic until gone. This antibiotic will treat both your ear infection and also your teeth if there is an infection in your tooth roots. Take the tramadol with acetaminophen 1000 mg 4 times a day for pain. You can continue taking ibuprofen 600 mg 4 times a day for pain. You need to see a dentist about your dental problems. If you are still having ear pain after the next 2-3 days consider seeing a ear nose and throat specialist. Dr. Suszanne Conners comes to [facility] 1 daily week. Call his office to get an appointment.   Otitis Media Otitis media is redness, soreness, and inflammation of the middle ear. Otitis media may be caused by allergies or, most commonly, by infection. Often it occurs as a complication of the common cold. SIGNS AND SYMPTOMS Symptoms of otitis media may include:  Earache.  Fever.  Ringing in your ear.  Headache.  Leakage of fluid from the ear. DIAGNOSIS To diagnose otitis media, your health care provider will examine your ear with an otoscope. This is an instrument that allows your health care provider to see into your ear in order to examine your eardrum. Your health care provider also will ask you questions about your symptoms. TREATMENT  Typically, otitis media resolves on its own within 3-5 days. Your health care provider may prescribe medicine to ease your symptoms of pain. If otitis media does not resolve within 5 days or is recurrent, your health care provider may prescribe antibiotic medicines if he or she suspects that a bacterial infection is the cause. HOME CARE INSTRUCTIONS   If you were prescribed an antibiotic medicine, finish it all even if you start to feel better.  Take medicines only as directed by your health care provider.  Keep all follow-up visits as directed by your health care provider. SEEK MEDICAL CARE IF:  You have otitis media only in one ear, or bleeding from your nose, or both.  You notice a lump on your neck.  You are not getting  better in 3-5 days.  You feel worse instead of better. SEEK IMMEDIATE MEDICAL CARE IF:   You have pain that is not controlled with medicine.  You have swelling, redness, or pain around your ear or stiffness in your neck.  You notice that part of your face is paralyzed.  You notice that the bone behind your ear (mastoid) is tender when you touch it. MAKE SURE YOU:   Understand these instructions.  Will watch your condition.  Will get help right away if you are not doing well or get worse. Document Released: 10/04/2003 Document Revised: 05/15/2013 Document Reviewed: 07/26/2012 [facility] Patient Information 2015 [place], [place]. This information is not intended to replace advice given to you by your health care provider. Make sure you discuss any questions you have with your health care provider.

## 2014-08-29 NOTE — ED Provider Notes (Signed)
CSN: [id number]     Arrival date & time 08/29/14  0351 History   First MD Initiated Contact with Patient 08/29/14 0425     Chief Complaint  Patient presents with  . Dental Pain     (Consider location/radiation/quality/duration/timing/severity/associated sxs/prior Treatment) HPI  Patient states yesterday morning she was awakened about 5 AM and she had some pain in her left lower jaw that she assumed was her teeth because she has very bad teeth. She took ibuprofen and went back to sleep. When she woke up later in the day she was still having pain. She took ibuprofen again. She states she worked all day and after she got off work at [employer] she went home and took Tylenol PM and put ice on the area. She states she was awakened at 3:30 this morning with pain now in her jaw and in her left throat and in her left ear. She denies any fever, nausea, vomiting. She denies any pain in the teeth when eating, she denies cold air or cold liquids making the teeth hurt more. She states she does not have a dentist due to no dental insurance. She states she no she needs to have all of her teeth extracted surgically.  PCP Dr Margo Aye  Past Medical History  Diagnosis Date  . Abscess     right buttocks/thigh  . DUB (dysfunctional uterine bleeding) 05/25/2012  . Polycystic ovaries   . Hydradenitis    Past Surgical History  Procedure Laterality Date  . Hernia repair    . Tonsillectomy    . Adenoidectomy    . Hydradenitis excision  10/02/2011    Procedure: EXCISION HYDRADENITIS AXILLA;  Surgeon: Fabio Bering, MD;  Location: [facility];  Service: General;  Laterality: Right;  Right Axillary Dissection  . Cholecystectomy  01/27/2012    Procedure: LAPAROSCOPIC CHOLECYSTECTOMY;  Surgeon: Fabio Bering, MD;  Location: [facility];  Service: General;  Laterality: N/A;  . Hydradenitis excision Left 10/19/2013    Procedure: INCISION OF HIDRADENITIS SUPPURATIVA LEFT AXILLA;  Surgeon: Marlane Hatcher, MD;  Location: [facility];   Service: General;  Laterality: Left;   Family History  Problem Relation Age of Onset  . Diabetes Mother   . Hypertension Mother   . Hyperlipidemia Mother   . Neuropathy Mother   . Diabetes Maternal Grandmother   . Hypertension Maternal Grandmother   . Heart disease Maternal Grandmother     CHF  . COPD Maternal Grandmother   . Cirrhosis Maternal Grandmother   . Anxiety disorder Maternal Grandmother   . Cancer Maternal Grandfather     lung  . Diabetes Paternal Grandmother   . Diabetes Paternal Grandfather   . Aneurysm Paternal Grandfather   . Anxiety disorder Paternal Grandfather   . Stroke Other   . Drug abuse Father   . ADD / ADHD Brother   . Anxiety disorder Maternal Uncle   . Anxiety disorder Paternal Uncle   . Depression Paternal Uncle   . Anxiety disorder Maternal Aunt    Social History  Substance Use Topics  . Smoking status: Current Every Day Smoker -- 0.50 packs/day for 7 years    Types: Cigarettes  . Smokeless tobacco: Never Used  . Alcohol Use: No     Comment: social use . 01-19-14 per pt no  smokes up to 1 ppd Employed as a CNA  OB History    Gravida Para Term Preterm AB TAB SAB Ectopic Multiple Living   0  Review of Systems  All other systems reviewed and are negative.     Allergies  Bactrim; Ciprofloxacin; Other; Penicillins; and Percocet  Home Medications   Prior to Admission medications   Medication Sig Start Date End Date Taking? Authorizing Provider  acetaminophen (TYLENOL) 500 MG tablet Take 1,000 mg by mouth every 6 (six) hours as needed for moderate pain.   Yes Historical Provider, MD  amphetamine-dextroamphetamine (ADDERALL) 20 MG tablet Take 1 tablet (20 mg total) by mouth 2 (two) times daily. 06/12/14 06/12/15 Yes Myrlene Broker, MD  clonazePAM (KLONOPIN) 1 MG tablet Take 1 tablet (1 mg total) by mouth 3 (three) times daily as needed for anxiety. 06/12/14 06/12/15 Yes Myrlene Broker, MD  escitalopram (LEXAPRO) 20 MG tablet Take 1  tablet (20 mg total) by mouth daily. 06/12/14 06/12/15 Yes Myrlene Broker, MD  etonogestrel-ethinyl estradiol (NUVARING) 0.12-0.015 MG/24HR vaginal ring Insert vaginally and leave in place for 3 consecutive weeks, then remove for 1 week. 09/27/13  Yes Peggy Constant, MD  ibuprofen (ADVIL,MOTRIN) 200 MG tablet Take 600 mg by mouth every 6 (six) hours as needed for moderate pain.   Yes Historical Provider, MD  amphetamine-dextroamphetamine (ADDERALL) 20 MG tablet Take 1 tablet (20 mg total) by mouth 2 (two) times daily. 06/12/14 06/12/15  Myrlene Broker, MD  amphetamine-dextroamphetamine (ADDERALL) 20 MG tablet Take 1 tablet (20 mg total) by mouth 2 (two) times daily. 06/12/14 06/12/15  Myrlene Broker, MD  clindamycin (CLEOCIN) 300 MG capsule Take 1 capsule (300 mg total) by mouth 4 (four) times daily. 08/29/14   Devoria Albe, MD  cyclobenzaprine (FLEXERIL) 10 MG tablet Take 1 tablet (10 mg total) by mouth 2 (two) times daily as needed for muscle spasms. 01/05/14   Donnetta Hutching, MD  doxycycline (VIBRAMYCIN) 100 MG capsule Take 1 capsule (100 mg total) by mouth 2 (two) times daily. 06/07/14   Gilda Crease, MD  estradiol (ESTRACE) 1 MG tablet Take 1 tablet (1 mg total) by mouth 2 (two) times daily. On days of breakthrough bleeding, up to a week in a row Patient taking differently: Take 1 mg by mouth 2 (two) times daily as needed. On days of breakthrough bleeding, up to a week in a row 12/29/13   Tilda Burrow, MD  HYDROcodone-acetaminophen (NORCO/VICODIN) 5-325 MG per tablet Take 1 tablet by mouth every 4 (four) hours as needed. 05/13/14   Burgess Amor, PA-C  traMADol (ULTRAM) 50 MG tablet Take 1 or 2 po Q 6hrs for pain 08/29/14   Devoria Albe, MD   BP 140/79 mmHg  Pulse 71  Temp(Src) 98.4 F (36.9 C) (Oral)  Resp 18  Ht 5\' 4"  (1.626 m)  Wt 222 lb (100.699 kg)  BMI 38.09 kg/m2  SpO2 99%  LMP 08/06/2014  Vital signs normal   Physical Exam  Constitutional: She is oriented to person, place, and time. She  appears well-developed and well-nourished.  Non-toxic appearance. She does not appear ill. No distress.  HENT:  Head: Normocephalic and atraumatic.  Right Ear: Tympanic membrane, external ear and ear canal normal.  Left Ear: External ear and ear canal normal.  Nose: Nose normal. No mucosal edema or rhinorrhea.  Mouth/Throat: Oropharynx is clear and moist and mucous membranes are normal. No dental abscesses or uvula swelling.  Patient's left TM is red and opaque. Patient has diffuse dental disease. Her lower left molars are all rotted to the gumline. The gum does not appear swollen or red. When I percuss those  teeth they are not painful. There is no facial swelling seen. She does have some tenderness under her jaw in the area of the tonsillar pillars.  Eyes: Conjunctivae and EOM are normal. Pupils are equal, round, and reactive to light.  Neck: Normal range of motion and full passive range of motion without pain. Neck supple.  Pulmonary/Chest: Effort normal. No respiratory distress. She has no rhonchi. She exhibits no crepitus.  Abdominal: Normal appearance.  Musculoskeletal: Normal range of motion.  Moves all extremities well.   Neurological: She is alert and oriented to person, place, and time. She has normal strength. No cranial nerve deficit.  Skin: Skin is warm, dry and intact. No rash noted. No erythema. No pallor.  Psychiatric: She has a normal mood and affect. Her speech is normal and behavior is normal. Her mood appears not anxious.  Nursing note and vitals reviewed.   ED Course  Procedures (including critical care time) Medications  traMADol (ULTRAM) tablet 100 mg (not administered)  ketorolac (TORADOL) injection 60 mg (not administered)  clindamycin (CLEOCIN) capsule 300 mg (not administered)   We discussed that the throat and the ear share a nerve so sometimes the brain can't distinguish which area is the pain source. At first when she was talking I thought she had referred  pain in her ear from her teeth however she does have an acute otitis in her left ear and her teeth do not seem to be the source of her pain. However after reviewing treatment for  PCN allergic patients with OM, clindamycin was a choice and would treat both her ear and her teeth well.     MDM   Final diagnoses:  Acute nonsuppurative otitis media of left ear   New Prescriptions   CLINDAMYCIN (CLEOCIN) 300 MG CAPSULE    Take 1 capsule (300 mg total) by mouth 4 (four) times daily.   TRAMADOL (ULTRAM) 50 MG TABLET    Take 1 or 2 po Q 6hrs for pain    Plan discharge  Devoria Albe, MD, Concha Pyo, MD 08/29/14 [id number]

## 2014-08-29 NOTE — ED Notes (Signed)
Pt states she is being treated for ear infection and she cannot keep her medicines down.

## 2014-08-29 NOTE — ED Provider Notes (Signed)
CSN: [id number]     Arrival date & time 08/29/14  2204 History  This chart was scribed for Kathleen Merced Randall An, MD by Placido Sou, ED scribe. This patient was seen in room APA19/[facility]19 and the patient's care was started at 10:39 PM.   Chief Complaint  Patient presents with  . Emesis   The history is provided by the patient. No language interpreter was used.    HPI Comments: Kathleen Velasquez is a 24 y.o. female, who was recently dx with otitis of her left ear, presents to the Emergency Department complaining of intermittent nausea and vomiting with onset this morning s/p beginning her prescribed medications from her last visit. Pt notes that she was just dx with Velasquez ear infection and was prescribed tramadol and clindamycin with her symptoms following shortly thereafter. She notes not having been able to eat, drink or take any other medications today due to them exacerbating her current symptoms. Pt notes Velasquez allergy to penicillin which causes her to break out into hives. She denies any other associated symptoms.   Past Medical History  Diagnosis Date  . Abscess     right buttocks/thigh  . DUB (dysfunctional uterine bleeding) 05/25/2012  . Polycystic ovaries   . Hydradenitis    Past Surgical History  Procedure Laterality Date  . Hernia repair    . Tonsillectomy    . Adenoidectomy    . Hydradenitis excision  10/02/2011    Procedure: EXCISION HYDRADENITIS AXILLA;  Surgeon: Fabio Bering, MD;  Location: [facility];  Service: General;  Laterality: Right;  Right Axillary Dissection  . Cholecystectomy  01/27/2012    Procedure: LAPAROSCOPIC CHOLECYSTECTOMY;  Surgeon: Fabio Bering, MD;  Location: [facility];  Service: General;  Laterality: N/A;  . Hydradenitis excision Left 10/19/2013    Procedure: INCISION OF HIDRADENITIS SUPPURATIVA LEFT AXILLA;  Surgeon: Marlane Hatcher, MD;  Location: [facility];  Service: General;  Laterality: Left;   Family History  Problem Relation Age of Onset  . Diabetes  Mother   . Hypertension Mother   . Hyperlipidemia Mother   . Neuropathy Mother   . Diabetes Maternal Grandmother   . Hypertension Maternal Grandmother   . Heart disease Maternal Grandmother     CHF  . COPD Maternal Grandmother   . Cirrhosis Maternal Grandmother   . Anxiety disorder Maternal Grandmother   . Cancer Maternal Grandfather     lung  . Diabetes Paternal Grandmother   . Diabetes Paternal Grandfather   . Aneurysm Paternal Grandfather   . Anxiety disorder Paternal Grandfather   . Stroke Other   . Drug abuse Father   . ADD / ADHD Brother   . Anxiety disorder Maternal Uncle   . Anxiety disorder Paternal Uncle   . Depression Paternal Uncle   . Anxiety disorder Maternal Aunt    Social History  Substance Use Topics  . Smoking status: Current Every Day Smoker -- 0.50 packs/day for 7 years    Types: Cigarettes  . Smokeless tobacco: Never Used  . Alcohol Use: No     Comment: social use . 01-19-14 per pt no   OB History    Gravida Para Term Preterm AB TAB SAB Ectopic Multiple Living   0              Review of Systems  HENT: Positive for ear pain. Negative for sore throat.   Gastrointestinal: Positive for nausea and vomiting. Negative for diarrhea.  All other systems reviewed and are negative.  Allergies  Bactrim; Ciprofloxacin; Other; Penicillins; and Percocet  Home Medications   Prior to Admission medications   Medication Sig Start Date End Date Taking? Authorizing Provider  acetaminophen (TYLENOL) 500 MG tablet Take 1,000 mg by mouth every 6 (six) hours as needed for moderate pain.    Historical Provider, MD  amphetamine-dextroamphetamine (ADDERALL) 20 MG tablet Take 1 tablet (20 mg total) by mouth 2 (two) times daily. 06/12/14 06/12/15  Myrlene Broker, MD  amphetamine-dextroamphetamine (ADDERALL) 20 MG tablet Take 1 tablet (20 mg total) by mouth 2 (two) times daily. 06/12/14 06/12/15  Myrlene Broker, MD  amphetamine-dextroamphetamine (ADDERALL) 20 MG tablet Take 1  tablet (20 mg total) by mouth 2 (two) times daily. 06/12/14 06/12/15  Myrlene Broker, MD  clindamycin (CLEOCIN) 300 MG capsule Take 1 capsule (300 mg total) by mouth 4 (four) times daily. 08/29/14   Devoria Albe, MD  clonazePAM (KLONOPIN) 1 MG tablet Take 1 tablet (1 mg total) by mouth 3 (three) times daily as needed for anxiety. 06/12/14 06/12/15  Myrlene Broker, MD  cyclobenzaprine (FLEXERIL) 10 MG tablet Take 1 tablet (10 mg total) by mouth 2 (two) times daily as needed for muscle spasms. 01/05/14   Donnetta Hutching, MD  doxycycline (VIBRAMYCIN) 100 MG capsule Take 1 capsule (100 mg total) by mouth 2 (two) times daily. 06/07/14   Gilda Crease, MD  escitalopram (LEXAPRO) 20 MG tablet Take 1 tablet (20 mg total) by mouth daily. 06/12/14 06/12/15  Myrlene Broker, MD  estradiol (ESTRACE) 1 MG tablet Take 1 tablet (1 mg total) by mouth 2 (two) times daily. On days of breakthrough bleeding, up to a week in a row Patient taking differently: Take 1 mg by mouth 2 (two) times daily as needed. On days of breakthrough bleeding, up to a week in a row 12/29/13   Tilda Burrow, MD  etonogestrel-ethinyl estradiol (NUVARING) 0.12-0.015 MG/24HR vaginal ring Insert vaginally and leave in place for 3 consecutive weeks, then remove for 1 week. 09/27/13   Peggy Constant, MD  HYDROcodone-acetaminophen (NORCO/VICODIN) 5-325 MG per tablet Take 1 tablet by mouth every 4 (four) hours as needed. 05/13/14   Burgess Amor, PA-C  ibuprofen (ADVIL,MOTRIN) 200 MG tablet Take 600 mg by mouth every 6 (six) hours as needed for moderate pain.    Historical Provider, MD  traMADol (ULTRAM) 50 MG tablet Take 1 or 2 po Q 6hrs for pain 08/29/14   Devoria Albe, MD   BP 132/71 mmHg  Pulse 68  Temp(Src) 98.7 F (37.1 C)  Resp 18  Ht  (1.626 m)  Wt 222 lb (100.699 kg)  BMI 38.09 kg/m2  SpO2 100%  LMP 08/06/2014 Physical Exam  Constitutional: She is oriented to person, place, and time. She appears well-developed and well-nourished. No distress.   HENT:  Head: Normocephalic and atraumatic.  Right Ear: External ear normal.  Mouth/Throat: No oropharyngeal exudate.  Eyes: Right eye exhibits no discharge. Left eye exhibits no discharge.  Neck: Normal range of motion. No tracheal deviation present.  Cardiovascular: Normal rate.   Pulmonary/Chest: Effort normal. No respiratory distress.  Abdominal: Soft. There is no tenderness.  Musculoskeletal: Normal range of motion.  Neurological: She is alert and oriented to person, place, and time.  Skin: Skin is warm and dry. She is not diaphoretic.  Psychiatric: She has a normal mood and affect. Her behavior is normal.  Nursing note and vitals reviewed.  ED Course  Procedures  DIAGNOSTIC STUDIES: Oxygen Saturation is 100%  on RA, normal by my interpretation.    COORDINATION OF CARE: 10:44 PM Discussed treatment plan with pt at bedside including Velasquez additional rx for her nausea and pt agreed to plan.  Labs Review Labs Reviewed - No data to display  Imaging Review No results found. I have personally reviewed and evaluated these images and lab results as part of my medical decision-making.   EKG Interpretation None      MDM   Final diagnoses:  None    Patient is a 24 year old female diagnosed 2 days ago with otitis media of the left ear. She presents because she is unable take her medications due to nausea and vomiting. Every time she takes one that she vomits within the hour. She is no additional symptoms. No fever. Her ear actually feels better. She is just concerned if she is unable take her medication due to vomiting.  Given Zofran and discharged home with Zofran prescription. We'll have her return to PCP as needed.  I personally performed the services described in this documentation, which was scribed in my presence. The recorded information has been reviewed and is accurate.    Emagene Merfeld Randall An, MD 08/29/14 [phone]

## 2014-08-29 NOTE — ED Notes (Signed)
Pt c/o dental pain since yesterday.  

## 2014-09-12 ENCOUNTER — Ambulatory Visit (INDEPENDENT_AMBULATORY_CARE_PROVIDER_SITE_OTHER): Payer: BLUE CROSS/BLUE SHIELD | Admitting: Psychiatry

## 2014-09-12 ENCOUNTER — Encounter (HOSPITAL_COMMUNITY): Payer: Self-pay | Admitting: Psychiatry

## 2014-09-12 VITALS — BP 110/68 | Ht 64.0 in | Wt 221.0 lb

## 2014-09-12 DIAGNOSIS — F411 Generalized anxiety disorder: Secondary | ICD-10-CM

## 2014-09-12 MED ORDER — AMPHETAMINE-DEXTROAMPHETAMINE 20 MG PO TABS: 20.0000 mg | ORAL_TABLET | Freq: Two times a day (BID) | ORAL | Status: DC

## 2014-09-12 MED ORDER — ESCITALOPRAM OXALATE 20 MG PO TABS: 20.0000 mg | ORAL_TABLET | Freq: Every day | ORAL | Status: DC

## 2014-09-12 MED ORDER — CLONAZEPAM 1 MG PO TABS: 1.0000 mg | ORAL_TABLET | Freq: Three times a day (TID) | ORAL | Status: DC | PRN

## 2014-09-12 NOTE — Progress Notes (Signed)
Patient ID: Kathleen Velasquez, female   DOB: [birth year], 24 y.o.   MRN: [id number] Patient ID: Kathleen Velasquez, female   DOB: Nov 21, 1990, 24 y.o.   MRN: [id number] Patient ID: Kathleen Velasquez, female   DOB: [birth year], 24 y.o.   MRN: [id number] Patient ID: Kathleen Velasquez, female   DOB: [birth year], 24 y.o.   MRN: [id number] Patient ID: Kathleen Velasquez, female   DOB: 28-Oct-1990, 23 y.o.   MRN: [id number]  Psychiatric Assessment Adult  Patient Identification:  Kathleen Velasquez Date of Evaluation:  09/12/2014 Chief Complaint: Anxiety History of Chief Complaint:   Chief Complaint  Patient presents with  . Depression  . ADD  . Follow-up    Depression        Associated symptoms include decreased concentration.  Past medical history includes anxiety.   Anxiety Symptoms include decreased concentration and nervous/anxious behavior.     this patient is a 12 year old single white female who lives with a friend  She works at  in a nursing home  The patient was referred by Dr. Dwana Melena, her primary physician for further assessment of anxiety.  The patient states that she's been having problems with anxiety and some depression since approximately March 2015. She and her fianc been together for several years and they want to have a baby together. However she's had significant problems with heavy menstrual bleeding. She's been diagnosed with polycystic ovary disease. The bleeding has gotten so bad that her recent visit with her via gynecologist recommended hysterectomy. She's been dealing with all of this since March and is been very depressed and despondent about it. Her primary physician is started her on Wellbutrin which has not helped.  The patient is also under stress regarding her 17-year-old niece. She states that her 62 year old sister, the niece's mother, does not seem at all responsible for taking care of a child so she and her mother do all the childcare and also the financial responsibilities for the child.  She feels overwhelmed by all of this plus her job. She does not sleep well and never has. She is also extremely anxious and when she has to do presentations at work she stutters and has panic attacks. She often has trouble focusing and goes from thing to thing without finishing. She denies suicidal ideation and her energy is fairly good. She was starting treatment at [facility] counseling but has not been able to get a return phone call for follow-up visits. She's had no prior psychiatric treatment and no history of substance abuse.  The patient returns after 3 months. Since I last saw her she and her fianc broke up in July. She found out he was cheating on her and it had been going on for quite a while. She is now living with a friend and she seems to be doing okay about this. She spent a lot of time doing extra shifts at work to keep herself busy. 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 Her mood is been good she's had a couple of panic attacks but this is settling down now and she is well focused Review of Systems  Constitutional: Negative.   HENT: Negative.   Eyes: Negative.   Respiratory: Negative.   Cardiovascular: Negative.   Gastrointestinal: Negative.   Endocrine: Negative.   Genitourinary: Positive for vaginal bleeding and pelvic pain.  Musculoskeletal: Positive for back pain.  Allergic/Immunologic: Negative.   Neurological: Negative.  Hematological: Negative.   Psychiatric/Behavioral: Positive for depression, sleep disturbance, dysphoric mood and decreased concentration. The patient is nervous/anxious.    Physical Exam not done  Depressive Symptoms: depressed mood, anhedonia, insomnia, hopelessness, anxiety, panic attacks,  (Hypo) Manic Symptoms:   Elevated Mood:  No Irritable Mood:  Yes Grandiosity:  No Distractibility:  Yes Labiality of Mood:  No Delusions:  No Hallucinations:  No Impulsivity:  No Sexually Inappropriate  Behavior:  No Financial Extravagance:  No Flight of Ideas:  No  Anxiety Symptoms: Excessive Worry:  Yes Panic Symptoms:  Yes Agoraphobia:  No Obsessive Compulsive: No  Symptoms: None, Specific Phobias:  No Social Anxiety:  Yes  Psychotic Symptoms:  Hallucinations: No None Delusions:  No Paranoia:  No   Ideas of Reference:  No  PTSD Symptoms: Ever had a traumatic exposure:  No Had a traumatic exposure in the last month:  No Re-experiencing: No None Hypervigilance:  No Hyperarousal: No None Avoidance: No None  Traumatic Brain Injury: No   Past Psychiatric History: Diagnosis: Generalized anxiety disorder   Hospitalizations: None   Outpatient Care: Has only seen the therapist a couple of times   Substance Abuse Care: none  Self-Mutilation: none  Suicidal Attempts: none  Violent Behaviors: none   Past Medical History:   Past Medical History  Diagnosis Date  . Abscess     right buttocks/thigh  . DUB (dysfunctional uterine bleeding) 05/25/2012  . Polycystic ovaries   . Hydradenitis    History of Loss of Consciousness:  No Seizure History:  No Cardiac History:  No Allergies:   Allergies  Allergen Reactions  . Bactrim Hives and Itching  . Ciprofloxacin Hives and Itching    [facility] Discontinued preop antibiotic  IVPB  Cipro in OR after developed itching and hives left arm.  Received Benadryl with relief noted. Dr.Gonzalez and Dr. Leticia Penna informed. DDallasRN  . Other Other (See Comments)    [place] causes a hives, swelling, itching, and anaphylaxis.   Marland Kitchen Penicillins Hives and Itching  . Percocet [Oxycodone-Acetaminophen] Other (See Comments)    Face flushes   Current Medications:  Current Outpatient Prescriptions  Medication Sig Dispense Refill  . acetaminophen (TYLENOL) 500 MG tablet Take 1,000 mg by mouth every 6 (six) hours as needed for moderate pain.    Marland Kitchen amphetamine-dextroamphetamine (ADDERALL) 20 MG tablet Take 1 tablet (20 mg total) by mouth 2 (two) times  daily. 60 tablet 0  . amphetamine-dextroamphetamine (ADDERALL) 20 MG tablet Take 1 tablet (20 mg total) by mouth 2 (two) times daily. 60 tablet 0  . amphetamine-dextroamphetamine (ADDERALL) 20 MG tablet Take 1 tablet (20 mg total) by mouth 2 (two) times daily. 60 tablet 0  . clindamycin (CLEOCIN) 300 MG capsule Take 1 capsule (300 mg total) by mouth 4 (four) times daily. 40 capsule 0  . clonazePAM (KLONOPIN) 1 MG tablet Take 1 tablet (1 mg total) by mouth 3 (three) times daily as needed for anxiety. 90 tablet 2  . escitalopram (LEXAPRO) 20 MG tablet Take 1 tablet (20 mg total) by mouth daily. 30 tablet 2  . estradiol (ESTRACE) 1 MG tablet Take 1 tablet (1 mg total) by mouth 2 (two) times daily. On days of breakthrough bleeding, up to a week in a row (Patient taking differently: Take 1 mg by mouth 2 (two) times daily as needed. On days of breakthrough bleeding, up to a week in a row) 30 tablet 2  . etonogestrel-ethinyl estradiol (NUVARING) 0.12-0.015 MG/24HR vaginal ring Insert vaginally and leave in  place for 3 consecutive weeks, then remove for 1 week. 1 each 12  . ibuprofen (ADVIL,MOTRIN) 200 MG tablet Take 600 mg by mouth every 6 (six) hours as needed for moderate pain.    Marland Kitchen ondansetron (ZOFRAN) 4 MG tablet Take 1 tablet (4 mg total) by mouth every 8 (eight) hours as needed for nausea or vomiting. 11 tablet 0  . traMADol (ULTRAM) 50 MG tablet Take 1 or 2 po Q 6hrs for pain (Patient taking differently: Take 50-100 mg by mouth every 6 (six) hours as needed for moderate pain. Take 1 or 2 po Q 6hrs for pain) 16 tablet 0   No current facility-administered medications for this visit.    Previous Psychotropic Medications:  Medication Dose   Currently on Wellbutrin XL 150 g every morning and clonazepam 0.5 mg 3 times a day                        Substance Abuse History in the last 12 months: Substance Age of 1st Use Last Use Amount Specific Type  Nicotine    smokes one half pack of cigarettes  per day    Alcohol      Cannabis      Opiates      Cocaine      Methamphetamines      LSD      Ecstasy      Benzodiazepines      Caffeine      Inhalants      Others:                          Medical Consequences of Substance Abuse: none  Legal Consequences of Substance Abuse: none  Family Consequences of Substance Abuse: none  Blackouts:  No DT's:  No Withdrawal Symptoms:  No None  Social History: Current Place of Residence: [place] [address] of Birth: [place] Family Members: Mother sister and niece. Parents are divorced and father lives in [place] with his wife and her 82 year old brother Marital Status:  Single Children:   Sons:   Daughters:  Relationships: Engaged Education:  [employer] Problems/Performance:  Religious Beliefs/Practices: none History of Abuse: none Armed forces technical officer; Armed forces training and education officer History:  None. Legal History: none Hobbies/Interests: TV  Family History:   Family History  Problem Relation Age of Onset  . Diabetes Mother   . Hypertension Mother   . Hyperlipidemia Mother   . Neuropathy Mother   . Diabetes Maternal Grandmother   . Hypertension Maternal Grandmother   . Heart disease Maternal Grandmother     CHF  . COPD Maternal Grandmother   . Cirrhosis Maternal Grandmother   . Anxiety disorder Maternal Grandmother   . Cancer Maternal Grandfather     lung  . Diabetes Paternal Grandmother   . Diabetes Paternal Grandfather   . Aneurysm Paternal Grandfather   . Anxiety disorder Paternal Grandfather   . Stroke Other   . Drug abuse Father   . ADD / ADHD Brother   . Anxiety disorder Maternal Uncle   . Anxiety disorder Paternal Uncle   . Depression Paternal Uncle   . Anxiety disorder Maternal Aunt     Mental Status Examination/Evaluation: Objective:  Appearance: Casual and Well Groomed  Eye Contact::  Good  Speech:  Clear and Coherent  Volume:   Normal  Mood:good  Affect:  bright  Thought Process:  Goal Directed  Orientation:  Full (Time, Place, and Person)  Thought Content:  Rumination  Suicidal Thoughts:  No  Homicidal Thoughts:  No  Judgement:  Good  Insight:  Good  Psychomotor Activity:  Normal  Akathisia:  No  Handed:  Right  AIMS (if indicated):    Assets:  Communication Skills Desire for Improvement Resilience Social Support Talents/Skills Vocational/Educational    Laboratory/X-Ray Psychological Evaluation(s)   Reviewed in chart     Assessment:  Axis I: Generalized Anxiety Disorder  AXIS I Generalized Anxiety Disorder  AXIS II Deferred  AXIS III Past Medical History  Diagnosis Date  . Abscess     right buttocks/thigh  . DUB (dysfunctional uterine bleeding) 05/25/2012  . Polycystic ovaries   . Hydradenitis      AXIS IV other psychosocial or environmental problems  AXIS V 51-60 moderate symptoms   Treatment Plan/Recommendations:  Plan of Care: Medication management   Laboratory:    Psychotherapy: The patient will start therapy here   Medications: , the patient will continue  Lexapro 20 mg daily for anxiety and depression She will also continue clonazepam to 1 mg 3 times a day as needed for anxiety symptoms and Adderall 20 mg twice a day to help with ADHD symptoms   Routine PRN Medications:  Yes  Consultations:   Safety Concerns:  She denies thoughts of hurting self or others   Other: She will return in 3 months     [name], MD 8/31/20161:44 PM

## 2014-10-02 ENCOUNTER — Emergency Department (HOSPITAL_COMMUNITY)
Admission: EM | Admit: 2014-10-02 | Discharge: 2014-10-02 | Disposition: A | Discharge status: Home / Self Care | Payer: BLUE CROSS/BLUE SHIELD | Attending: Emergency Medicine | Admitting: Emergency Medicine

## 2014-10-02 ENCOUNTER — Encounter (HOSPITAL_COMMUNITY): Payer: Self-pay | Admitting: Emergency Medicine

## 2014-10-02 DIAGNOSIS — X58XXXA Exposure to other specified factors, initial encounter: Secondary | ICD-10-CM | POA: Diagnosis not present

## 2014-10-02 DIAGNOSIS — Z79899 Other long term (current) drug therapy: Secondary | ICD-10-CM | POA: Diagnosis not present

## 2014-10-02 DIAGNOSIS — Z8742 Personal history of other diseases of the female genital tract: Secondary | ICD-10-CM | POA: Diagnosis not present

## 2014-10-02 DIAGNOSIS — Y9339 Activity, other involving climbing, rappelling and jumping off: Secondary | ICD-10-CM | POA: Diagnosis not present

## 2014-10-02 DIAGNOSIS — S79921A Unspecified injury of right thigh, initial encounter: Secondary | ICD-10-CM | POA: Diagnosis present

## 2014-10-02 DIAGNOSIS — S76911A Strain of unspecified muscles, fascia and tendons at thigh level, right thigh, initial encounter: Secondary | ICD-10-CM | POA: Insufficient documentation

## 2014-10-02 DIAGNOSIS — Z720 Tobacco use: Secondary | ICD-10-CM | POA: Diagnosis not present

## 2014-10-02 DIAGNOSIS — Z872 Personal history of diseases of the skin and subcutaneous tissue: Secondary | ICD-10-CM | POA: Insufficient documentation

## 2014-10-02 DIAGNOSIS — Y998 Other external cause status: Secondary | ICD-10-CM | POA: Insufficient documentation

## 2014-10-02 DIAGNOSIS — Z880 Allergy status to penicillin: Secondary | ICD-10-CM | POA: Insufficient documentation

## 2014-10-02 DIAGNOSIS — Y9289 Other specified places as the place of occurrence of the external cause: Secondary | ICD-10-CM | POA: Diagnosis not present

## 2014-10-02 MED ORDER — HYDROCODONE-ACETAMINOPHEN 5-325 MG PO TABS: 1.0000 | ORAL_TABLET | ORAL | Status: DC | PRN

## 2014-10-02 NOTE — ED Provider Notes (Signed)
CSN: [id number]     Arrival date & time 10/02/14  1600 History   First MD Initiated Contact with Patient 10/02/14 1614     Chief Complaint  Patient presents with  . Leg Pain     (Consider location/radiation/quality/duration/timing/severity/associated sxs/prior Treatment) The history is provided by the patient.   Kathleen Velasquez is a 24 y.o. female with a history of chronic intermittent low back pain presenting with right anteriolateral thigh pain since moving furniture 2 days ago . She describes jumping up and down from a truck bed multiple times, denies specific injury but stressed her leg muscles with this increased activity.  She has used her home soma along with ibuprofen, ice and muscle rub without relief.    Past Medical History  Diagnosis Date  . Abscess     right buttocks/thigh  . DUB (dysfunctional uterine bleeding) 05/25/2012  . Polycystic ovaries   . Hydradenitis    Past Surgical History  Procedure Laterality Date  . Hernia repair    . Tonsillectomy    . Adenoidectomy    . Hydradenitis excision  10/02/2011    Procedure: EXCISION HYDRADENITIS AXILLA;  Surgeon: Fabio Bering, MD;  Location: [facility];  Service: General;  Laterality: Right;  Right Axillary Dissection  . Cholecystectomy  01/27/2012    Procedure: LAPAROSCOPIC CHOLECYSTECTOMY;  Surgeon: Fabio Bering, MD;  Location: [facility];  Service: General;  Laterality: N/A;  . Hydradenitis excision Left 10/19/2013    Procedure: INCISION OF HIDRADENITIS SUPPURATIVA LEFT AXILLA;  Surgeon: Marlane Hatcher, MD;  Location: [facility];  Service: General;  Laterality: Left;   Family History  Problem Relation Age of Onset  . Diabetes Mother   . Hypertension Mother   . Hyperlipidemia Mother   . Neuropathy Mother   . Diabetes Maternal Grandmother   . Hypertension Maternal Grandmother   . Heart disease Maternal Grandmother     CHF  . COPD Maternal Grandmother   . Cirrhosis Maternal Grandmother   . Anxiety disorder Maternal  Grandmother   . Cancer Maternal Grandfather     lung  . Diabetes Paternal Grandmother   . Diabetes Paternal Grandfather   . Aneurysm Paternal Grandfather   . Anxiety disorder Paternal Grandfather   . Stroke Other   . Drug abuse Father   . ADD / ADHD Brother   . Anxiety disorder Maternal Uncle   . Anxiety disorder Paternal Uncle   . Depression Paternal Uncle   . Anxiety disorder Maternal Aunt    Social History  Substance Use Topics  . Smoking status: Current Every Day Smoker -- 0.50 packs/day for 7 years    Types: Cigarettes  . Smokeless tobacco: Never Used  . Alcohol Use: No     Comment: social use . 01-19-14 per pt no   OB History    Gravida Para Term Preterm AB TAB SAB Ectopic Multiple Living   0              Review of Systems  Constitutional: Negative for fever.  Musculoskeletal: Positive for myalgias and arthralgias. Negative for joint swelling.  Skin: Negative for color change.  Neurological: Negative for weakness and numbness.      Allergies  Bactrim; Ciprofloxacin; Other; Penicillins; and Percocet  Home Medications   Prior to Admission medications   Medication Sig Start Date End Date Taking? Authorizing Provider  acetaminophen (TYLENOL) 500 MG tablet Take 1,000 mg by mouth every 6 (six) hours as needed for moderate pain.  Historical Provider, MD  amphetamine-dextroamphetamine (ADDERALL) 20 MG tablet Take 1 tablet (20 mg total) by mouth 2 (two) times daily. 09/12/14 09/12/15  Myrlene Broker, MD  amphetamine-dextroamphetamine (ADDERALL) 20 MG tablet Take 1 tablet (20 mg total) by mouth 2 (two) times daily. 09/12/14 09/12/15  Myrlene Broker, MD  amphetamine-dextroamphetamine (ADDERALL) 20 MG tablet Take 1 tablet (20 mg total) by mouth 2 (two) times daily. 09/12/14 09/12/15  Myrlene Broker, MD  clindamycin (CLEOCIN) 300 MG capsule Take 1 capsule (300 mg total) by mouth 4 (four) times daily. 08/29/14   Devoria Albe, MD  clonazePAM (KLONOPIN) 1 MG tablet Take 1 tablet (1 mg  total) by mouth 3 (three) times daily as needed for anxiety. 09/12/14 09/12/15  Myrlene Broker, MD  escitalopram (LEXAPRO) 20 MG tablet Take 1 tablet (20 mg total) by mouth daily. 09/12/14 09/12/15  Myrlene Broker, MD  estradiol (ESTRACE) 1 MG tablet Take 1 tablet (1 mg total) by mouth 2 (two) times daily. On days of breakthrough bleeding, up to a week in a row Patient taking differently: Take 1 mg by mouth 2 (two) times daily as needed. On days of breakthrough bleeding, up to a week in a row 12/29/13   Tilda Burrow, MD  etonogestrel-ethinyl estradiol (NUVARING) 0.12-0.015 MG/24HR vaginal ring Insert vaginally and leave in place for 3 consecutive weeks, then remove for 1 week. 09/27/13   Peggy Constant, MD  HYDROcodone-acetaminophen (NORCO/VICODIN) 5-325 MG per tablet Take 1 tablet by mouth every 4 (four) hours as needed. 10/02/14   Burgess Amor, PA-C  ibuprofen (ADVIL,MOTRIN) 200 MG tablet Take 600 mg by mouth every 6 (six) hours as needed for moderate pain.    Historical Provider, MD  ondansetron (ZOFRAN) 4 MG tablet Take 1 tablet (4 mg total) by mouth every 8 (eight) hours as needed for nausea or vomiting. 08/29/14   Courteney Lyn Mackuen, MD  traMADol (ULTRAM) 50 MG tablet Take 1 or 2 po Q 6hrs for pain Patient taking differently: Take 50-100 mg by mouth every 6 (six) hours as needed for moderate pain. Take 1 or 2 po Q 6hrs for pain 08/29/14   Devoria Albe, MD   BP 132/71 mmHg  Pulse 83  Temp(Src) 98.6 F (37 C) (Oral)  Resp 18  Ht  (1.626 m)  Wt 216 lb (97.977 kg)  BMI 37.06 kg/m2  SpO2 99%  LMP 09/13/2014 Physical Exam  Constitutional: She appears well-developed and well-nourished.  HENT:  Head: Atraumatic.  Neck: Normal range of motion.  Cardiovascular:  Pulses equal bilaterally  Musculoskeletal: She exhibits tenderness.  ttp right outer upper thigh. No edema, soft compartments, no visible trauma.  Pain with resisted abduction. No pain with knee flexion or adduction.  Dorsalis pedal  pulses intact.  No knee or calf ttp.    Neurological: She is alert. She has normal strength. She displays normal reflexes. No sensory deficit.  Skin: Skin is warm and dry.  Psychiatric: She has a normal mood and affect.    ED Course  Procedures (including critical care time) Labs Review Labs Reviewed - No data to display  Imaging Review No results found. I have personally reviewed and evaluated these images and lab results as part of my medical decision-making.   EKG Interpretation None      MDM   Final diagnoses:  Muscle strain of thigh, right, initial encounter    Continue heat tx, ibuprofen,  Hydrocodone, f/u with pcp if sx persist.  Burgess Amor, PA-C 10/02/14 1711  Mancel Bale, MD 10/02/14 [phone]

## 2014-10-02 NOTE — Discharge Instructions (Signed)
Muscle Strain A muscle strain is an injury that occurs when a muscle is stretched beyond its normal length. Usually a small number of muscle fibers are torn when this happens. Muscle strain is rated in degrees. First-degree strains have the least amount of muscle fiber tearing and pain. Second-degree and third-degree strains have increasingly more tearing and pain.  Usually, recovery from muscle strain takes 1-2 weeks. Complete healing takes 5-6 weeks.  CAUSES  Muscle strain happens when a sudden, violent force placed on a muscle stretches it too far. This may occur with lifting, sports, or a fall.  RISK FACTORS Muscle strain is especially common in athletes.  SIGNS AND SYMPTOMS At the site of the muscle strain, there may be:  Pain.  Bruising.  Swelling.  Difficulty using the muscle due to pain or lack of normal function. DIAGNOSIS  Your health care provider will perform a physical exam and ask about your medical history. TREATMENT  Often, the best treatment for a muscle strain is resting, icing, and applying cold compresses to the injured area.  HOME CARE INSTRUCTIONS   Use the PRICE method of treatment to promote muscle healing during the first 2-3 days after your injury. The PRICE method involves:  Protecting the muscle from being injured again.  Restricting your activity and resting the injured body part.  Icing your injury. To do this, put ice in a plastic bag. Place a towel between your skin and the bag. Then, apply the ice and leave it on from 15-20 minutes each hour. After the third day, switch to moist heat packs.  Apply compression to the injured area with a splint or elastic bandage. Be careful not to wrap it too tightly. This may interfere with blood circulation or increase swelling.  Elevate the injured body part above the level of your heart as often as you can.  Only take over-the-counter or prescription medicines for pain, discomfort, or fever as directed by your  health care provider.  Warming up prior to exercise helps to prevent future muscle strains. SEEK MEDICAL CARE IF:   You have increasing pain or swelling in the injured area.  You have numbness, tingling, or a significant loss of strength in the injured area. MAKE SURE YOU:   Understand these instructions.  Will watch your condition.  Will get help right away if you are not doing well or get worse. Document Released: 12/29/2004 Document Revised: 10/19/2012 Document Reviewed: 07/28/2012 [facility] Patient Information 2015 [place], [place]. This information is not intended to replace advice given to you by your health care provider. Make sure you discuss any questions you have with your health care provider.   Continue to use your ibuprofen (600 mg every 6 hours) along with your soma.  Apply a heating pad for 20 minutes 3 times daily.  See your doctor for a recheck if your symptoms are not improving over the next week.  You may take the hydrocodone prescribed for pain relief.  This will make you drowsy - do not drive within 4 hours of taking this medication.

## 2014-10-02 NOTE — ED Notes (Signed)
Pt c/o RT leg pain after moving furniture on Sunday. Pain increases with movement. Pt states pain has become increasingly worse. Pt ambulatory. Denies numbness/tingling.

## 2014-10-15 ENCOUNTER — Other Ambulatory Visit: Payer: Self-pay | Admitting: Obstetrics and Gynecology

## 2014-10-17 ENCOUNTER — Emergency Department (HOSPITAL_COMMUNITY)
Admission: EM | Admit: 2014-10-17 | Discharge: 2014-10-17 | Disposition: A | Discharge status: Home / Self Care | Payer: BLUE CROSS/BLUE SHIELD | Attending: Emergency Medicine | Admitting: Emergency Medicine

## 2014-10-17 ENCOUNTER — Encounter (HOSPITAL_COMMUNITY): Payer: Self-pay | Admitting: Emergency Medicine

## 2014-10-17 DIAGNOSIS — L02412 Cutaneous abscess of left axilla: Secondary | ICD-10-CM

## 2014-10-17 DIAGNOSIS — Z880 Allergy status to penicillin: Secondary | ICD-10-CM | POA: Diagnosis not present

## 2014-10-17 DIAGNOSIS — Z9889 Other specified postprocedural states: Secondary | ICD-10-CM | POA: Diagnosis not present

## 2014-10-17 DIAGNOSIS — Z8742 Personal history of other diseases of the female genital tract: Secondary | ICD-10-CM | POA: Insufficient documentation

## 2014-10-17 DIAGNOSIS — Z79899 Other long term (current) drug therapy: Secondary | ICD-10-CM | POA: Diagnosis not present

## 2014-10-17 DIAGNOSIS — Z720 Tobacco use: Secondary | ICD-10-CM | POA: Insufficient documentation

## 2014-10-17 DIAGNOSIS — Z8639 Personal history of other endocrine, nutritional and metabolic disease: Secondary | ICD-10-CM | POA: Diagnosis not present

## 2014-10-17 MED ORDER — LIDOCAINE HCL (PF) 1 % IJ SOLN
5.0000 mL | Freq: Once | INTRAMUSCULAR | Status: AC
  Administered 2014-10-17: 5 mL
  Filled 2014-10-17: qty 5

## 2014-10-17 MED ORDER — HYDROCODONE-ACETAMINOPHEN 5-325 MG PO TABS: 1.0000 | ORAL_TABLET | ORAL | Status: DC | PRN

## 2014-10-17 MED ORDER — DOXYCYCLINE HYCLATE 100 MG PO CAPS: 100.0000 mg | ORAL_CAPSULE | Freq: Two times a day (BID) | ORAL | Status: DC

## 2014-10-17 NOTE — ED Notes (Signed)
Pt states that she has chronic hidradenitis under left arm.  Had surgery 1 year ago.  Continues to have abscess formation.

## 2014-10-17 NOTE — Discharge Instructions (Signed)
Do not take the narcotic if driving or at work because it will make you sleepy. Follow up with Dr. Lovell Sheehan. Return here as needed. Take ibuprofen in addition to the other medications.

## 2014-10-17 NOTE — ED Provider Notes (Signed)
CSN: [id number]     Arrival date & time 10/17/14  1703 History   First MD Initiated Contact with Patient 10/17/14 1720     Chief Complaint  Patient presents with  . Abscess     (Consider location/radiation/quality/duration/timing/severity/associated sxs/prior Treatment) Patient is a 24 y.o. female presenting with abscess. The history is provided by the patient.  Abscess Location:  Shoulder/arm Shoulder/arm abscess location:  L axilla Abscess quality: fluctuance, painful, redness and warmth   Red streaking: no   Duration:  1 week Progression:  Worsening Pain details:    Quality:  Throbbing and sharp   Severity:  Moderate   Timing:  Constant   Progression:  Worsening Chronicity:  Recurrent Relieved by:  Nothing Worsened by:  Draining/squeezing Ineffective treatments:  Draining/squeezing Risk factors: prior abscess    Kathleen Velasquez is a 24 y.o. female who presents to the ED with swelling and tenderness to the left axilla. There are three different places that are tender.  Hx of surgery for chronic hidradenitis of left axilla but continues to get abscesses from time to time. 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 She denies other problems.   Past Medical History  Diagnosis Date  . Abscess     right buttocks/thigh  . DUB (dysfunctional uterine bleeding) 05/25/2012  . Polycystic ovaries   . Hydradenitis    Past Surgical History  Procedure Laterality Date  . Hernia repair    . Tonsillectomy    . Adenoidectomy    . Hydradenitis excision  10/02/2011    Procedure: EXCISION HYDRADENITIS AXILLA;  Surgeon: Fabio Bering, MD;  Location: [facility];  Service: General;  Laterality: Right;  Right Axillary Dissection  . Cholecystectomy  01/27/2012    Procedure: LAPAROSCOPIC CHOLECYSTECTOMY;  Surgeon: Fabio Bering, MD;  Location: [facility];  Service: General;  Laterality: N/A;  . Hydradenitis excision Left 10/19/2013   Procedure: INCISION OF HIDRADENITIS SUPPURATIVA LEFT AXILLA;  Surgeon: Marlane Hatcher, MD;  Location: [facility];  Service: General;  Laterality: Left;   Family History  Problem Relation Age of Onset  . Diabetes Mother   . Hypertension Mother   . Hyperlipidemia Mother   . Neuropathy Mother   . Diabetes Maternal Grandmother   . Hypertension Maternal Grandmother   . Heart disease Maternal Grandmother     CHF  . COPD Maternal Grandmother   . Cirrhosis Maternal Grandmother   . Anxiety disorder Maternal Grandmother   . Cancer Maternal Grandfather     lung  . Diabetes Paternal Grandmother   . Diabetes Paternal Grandfather   . Aneurysm Paternal Grandfather   . Anxiety disorder Paternal Grandfather   . Stroke Other   . Drug abuse Father   . ADD / ADHD Brother   . Anxiety disorder Maternal Uncle   . Anxiety disorder Paternal Uncle   . Depression Paternal Uncle   . Anxiety disorder Maternal Aunt    Social History  Substance Use Topics  . Smoking status: Current Every Day Smoker -- 0.50 packs/day for 7 years    Types: Cigarettes  . Smokeless tobacco: Never Used  . Alcohol Use: No     Comment: social use . 01-19-14 per pt no   OB History    Gravida Para Term Preterm AB TAB SAB Ectopic Multiple Living   0              Review  of Systems Negative except as stated in HPI   Allergies  Bactrim; Ciprofloxacin; Other; Penicillins; and Percocet  Home Medications   Prior to Admission medications   Medication Sig Start Date End Date Taking? Authorizing Provider  acetaminophen (TYLENOL) 500 MG tablet Take 1,000 mg by mouth every 6 (six) hours as needed for moderate pain.    Historical Provider, MD  amphetamine-dextroamphetamine (ADDERALL) 20 MG tablet Take 1 tablet (20 mg total) by mouth 2 (two) times daily. 09/12/14 09/12/15  Myrlene Broker, MD  amphetamine-dextroamphetamine (ADDERALL) 20 MG tablet Take 1 tablet (20 mg total) by mouth 2 (two) times daily. 09/12/14 09/12/15  Myrlene Broker,  MD  amphetamine-dextroamphetamine (ADDERALL) 20 MG tablet Take 1 tablet (20 mg total) by mouth 2 (two) times daily. 09/12/14 09/12/15  Myrlene Broker, MD  clonazePAM (KLONOPIN) 1 MG tablet Take 1 tablet (1 mg total) by mouth 3 (three) times daily as needed for anxiety. 09/12/14 09/12/15  Myrlene Broker, MD  doxycycline (VIBRAMYCIN) 100 MG capsule Take 1 capsule (100 mg total) by mouth 2 (two) times daily. 10/17/14   Hope Orlene Och, NP  escitalopram (LEXAPRO) 20 MG tablet Take 1 tablet (20 mg total) by mouth daily. 09/12/14 09/12/15  Myrlene Broker, MD  estradiol (ESTRACE) 1 MG tablet Take 1 tablet (1 mg total) by mouth 2 (two) times daily. On days of breakthrough bleeding, up to a week in a row Patient taking differently: Take 1 mg by mouth 2 (two) times daily as needed. On days of breakthrough bleeding, up to a week in a row 12/29/13   Tilda Burrow, MD  HYDROcodone-acetaminophen (NORCO/VICODIN) 5-325 MG tablet Take 1 tablet by mouth every 4 (four) hours as needed. 10/17/14   Hope Orlene Och, NP  ibuprofen (ADVIL,MOTRIN) 200 MG tablet Take 600 mg by mouth every 6 (six) hours as needed for moderate pain.    Historical Provider, MD  NUVARING 0.12-0.015 MG/24HR vaginal ring INSERT VAGINALLY AND LEAVE IN PLACE FOR 3 CONSECUTIVE WEEKS, THEN REMOVE FOR 1 WEEK. 10/15/14   Peggy Constant, MD  ondansetron (ZOFRAN) 4 MG tablet Take 1 tablet (4 mg total) by mouth every 8 (eight) hours as needed for nausea or vomiting. 08/29/14   Courteney Lyn Mackuen, MD  traMADol (ULTRAM) 50 MG tablet Take 1 or 2 po Q 6hrs for pain Patient taking differently: Take 50-100 mg by mouth every 6 (six) hours as needed for moderate pain. Take 1 or 2 po Q 6hrs for pain 08/29/14   Devoria Albe, MD   BP 130/79 mmHg  Pulse 78  Temp(Src) 98 F (36.7 C) (Oral)  Resp 18  Ht  (1.626 m)  Wt 216 lb (97.977 kg)  BMI 37.06 kg/m2  SpO2 100%  LMP 09/13/2014 Physical Exam  Constitutional: She is oriented to person, place, and time. She appears  well-developed and well-nourished. No distress.  HENT:  Head: Normocephalic.  Eyes: EOM are normal.  Neck: Neck supple.  Cardiovascular: Normal rate.   Pulmonary/Chest: Effort normal.  Abdominal: Soft. There is no tenderness.  Musculoskeletal: Normal range of motion.  Left axilla with three areas that are raised and tender. One area to the central axilla has small amount of drainage. No red streaking noted.   Neurological: She is alert and oriented to person, place, and time. No cranial nerve deficit.  Skin: Skin is warm and dry.  Psychiatric: She has a normal mood and affect. Her behavior is normal.  Nursing note and vitals reviewed.  ED Course  INCISION AND DRAINAGE Date/Time: 10/17/2014 5:59 PM Performed by: Janne Napoleon Authorized by: Janne Napoleon Consent: Verbal consent obtained. Risks and benefits: risks, benefits and alternatives were discussed Consent given by: patient Patient understanding: patient states understanding of the procedure being performed Required items: required blood products, implants, devices, and special equipment available Patient identity confirmed: verbally with patient Type: abscess Body area: upper extremity (left axilla) Anesthesia: local infiltration Local anesthetic: lidocaine 1% without epinephrine Anesthetic total: 5 ml Patient sedated: no Scalpel size: 11 Incision type: single straight Incision depth: subcutaneous Complexity: simple Drainage: purulent and  bloody Drainage amount: scant Wound treatment: wound left open Patient tolerance: Patient tolerated the procedure well with no immediate complications   Labs Review  MDM  24 y.o. female with pain and swelling to the left axilla stable for d/c without fever, red streaking and does not appear toxic. She will follow up with the general surgeon. Will start antibiotics and pain medication and she will continue to take ibuprofen. Discussed with the patient and all questioned fully  answered. She will return herer if any problems arise.   Final diagnoses:  Abscess of left axilla        Janne Napoleon, NP 10/17/14 1804  Benjiman Core, MD 10/17/14 [phone]

## 2014-10-17 NOTE — ED Notes (Signed)
Pt verbalized understanding of no driving and to use caution within 4 hours of taking pain meds due to meds cause drowsiness 

## 2014-12-04 ENCOUNTER — Ambulatory Visit (HOSPITAL_COMMUNITY): Payer: Self-pay | Admitting: Psychiatry

## 2015-01-16 ENCOUNTER — Encounter (HOSPITAL_COMMUNITY): Payer: Self-pay | Admitting: Psychiatry

## 2015-01-16 ENCOUNTER — Ambulatory Visit (INDEPENDENT_AMBULATORY_CARE_PROVIDER_SITE_OTHER): Payer: BLUE CROSS/BLUE SHIELD | Admitting: Psychiatry

## 2015-01-16 VITALS — BP 132/77 | HR 90 | Ht 64.0 in | Wt 224.2 lb

## 2015-01-16 DIAGNOSIS — F411 Generalized anxiety disorder: Secondary | ICD-10-CM

## 2015-01-16 MED ORDER — AMPHETAMINE-DEXTROAMPHETAMINE 20 MG PO TABS: 20.0000 mg | ORAL_TABLET | Freq: Two times a day (BID) | ORAL | Status: DC

## 2015-01-16 MED ORDER — ESCITALOPRAM OXALATE 20 MG PO TABS: 20.0000 mg | ORAL_TABLET | Freq: Every day | ORAL | Status: DC

## 2015-01-16 MED ORDER — CLONAZEPAM 1 MG PO TABS: 1.0000 mg | ORAL_TABLET | Freq: Three times a day (TID) | ORAL | Status: DC | PRN

## 2015-01-16 NOTE — Progress Notes (Signed)
Patient ID: Kathleen Velasquez, female   DOB: [birth year], 25 y.o.   MRN: [id number] Patient ID: Kathleen Velasquez, female   DOB: 1990-08-08, 25 y.o.   MRN: [id number] Patient ID: Kathleen Velasquez, female   DOB: 05/08/1990, 25 y.o.   MRN: [id number] Patient ID: Kathleen Velasquez, female   DOB: 1990/09/26, 25 y.o.   MRN: [id number] Patient ID: Kathleen Velasquez, female   DOB: December 07, 1990, 25 y.o.   MRN: [id number] Patient ID: Kathleen Velasquez, female   DOB: 18-Mar-1990, 25 y.o.   MRN: [id number]  Psychiatric Assessment Adult  Patient Identification:  Kathleen Velasquez Date of Evaluation:  01/16/2015 Chief Complaint: Anxiety History of Chief Complaint:   Chief Complaint  Patient presents with  . Depression  . ADHD  . Follow-up    Depression        Associated symptoms include decreased concentration.  Past medical history includes anxiety.   Anxiety Symptoms include decreased concentration and nervous/anxious behavior.     this patient is a 25 year old single white female who lives with a friend  She works at  in a nursing home  The patient was referred by Dr. Dwana Melena, her primary physician for further assessment of anxiety.  The patient states that she's been having problems with anxiety and some depression since approximately March 2015. She and her fianc been together for several years and they want to have a baby together. However she's had significant problems with heavy menstrual bleeding. She's been diagnosed with polycystic ovary disease. The bleeding has gotten so bad that her recent visit with her via gynecologist recommended hysterectomy. She's been dealing with all of this since March and is been very depressed and despondent about it. Her primary physician is started her on Wellbutrin which has not helped.  The patient is also under stress regarding her 34-year-old niece. She states that her 52 year old sister, the niece's mother, does not seem at all responsible for taking care of a child so she and her  mother do all the childcare and also the financial responsibilities for the child. She feels overwhelmed by all of this plus her job. She does not sleep well and never has. She is also extremely anxious and when she has to do presentations at work she stutters and has panic attacks. She often has trouble focusing and goes from thing to thing without finishing. She denies suicidal ideation and her energy is fairly good. She was starting treatment at [facility] but has not been able to get a return phone call for follow-up visits. She's had no prior psychiatric treatment and no history of substance abuse.  The patient returns after 4 months. Overall she is doing much better. She staying focused at work. She only uses the clonazepam as needed. Her mood is good. 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 Review of Systems  Constitutional: Negative.   HENT: Negative.   Eyes: Negative.   Respiratory: Negative.   Cardiovascular: Negative.   Gastrointestinal: Negative.   Endocrine: Negative.   Genitourinary: Positive for vaginal bleeding and pelvic pain.  Musculoskeletal: Positive for back pain.  Allergic/Immunologic: Negative.   Neurological: Negative.   Hematological: Negative.   Psychiatric/Behavioral: Positive for depression, sleep disturbance, dysphoric mood and decreased concentration. The patient is nervous/anxious.    Physical Exam not done  Depressive Symptoms: depressed mood, anhedonia, insomnia, hopelessness, anxiety, panic attacks,  (Hypo) Manic Symptoms:  Elevated Mood:  No Irritable Mood:  Yes Grandiosity:  No Distractibility:  Yes Labiality of Mood:  No Delusions:  No Hallucinations:  No Impulsivity:  No Sexually Inappropriate Behavior:  No Financial Extravagance:  No Flight of Ideas:  No  Anxiety Symptoms: Excessive Worry:  Yes Panic Symptoms:  Yes Agoraphobia:  No Obsessive Compulsive: No  Symptoms:  None, Specific Phobias:  No Social Anxiety:  Yes  Psychotic Symptoms:  Hallucinations: No None Delusions:  No Paranoia:  No   Ideas of Reference:  No  PTSD Symptoms: Ever had a traumatic exposure:  No Had a traumatic exposure in the last month:  No Re-experiencing: No None Hypervigilance:  No Hyperarousal: No None Avoidance: No None  Traumatic Brain Injury: No   Past Psychiatric History: Diagnosis: Generalized anxiety disorder   Hospitalizations: None   Outpatient Care: Has only seen the therapist a couple of times   Substance Abuse Care: none  Self-Mutilation: none  Suicidal Attempts: none  Violent Behaviors: none   Past Medical History:   Past Medical History  Diagnosis Date  . Abscess     right buttocks/thigh  . DUB (dysfunctional uterine bleeding) 05/25/2012  . Polycystic ovaries   . Hydradenitis    History of Loss of Consciousness:  No Seizure History:  No Cardiac History:  No Allergies:   Allergies  Allergen Reactions  . Bactrim Hives and Itching  . Ciprofloxacin Hives and Itching    [facility] Discontinued preop antibiotic  IVPB  Cipro in OR after developed itching and hives left arm.  Received Benadryl with relief noted. Dr.Gonzalez and Dr. Leticia PennaZiegler informed. DDallasRN  . Other Other (See Comments)    [place] causes a hives, swelling, itching, and anaphylaxis.   Marland Kitchen. Penicillins Hives and Itching  . Percocet [Oxycodone-Acetaminophen] Other (See Comments)    Face flushes   Current Medications:  Current Outpatient Prescriptions  Medication Sig Dispense Refill  . acetaminophen (TYLENOL) 500 MG tablet Take 1,000 mg by mouth every 6 (six) hours as needed for moderate pain.    Marland Kitchen. amphetamine-dextroamphetamine (ADDERALL) 20 MG tablet Take 1 tablet (20 mg total) by mouth 2 (two) times daily. 60 tablet 0  . clonazePAM (KLONOPIN) 1 MG tablet Take 1 tablet (1 mg total) by mouth 3 (three) times daily as needed for anxiety. 90 tablet 2  . escitalopram (LEXAPRO) 20 MG tablet  Take 1 tablet (20 mg total) by mouth daily. 30 tablet 2  . ibuprofen (ADVIL,MOTRIN) 200 MG tablet Take 600 mg by mouth every 6 (six) hours as needed for moderate pain.    Marland Kitchen. NUVARING 0.12-0.015 MG/24HR vaginal ring INSERT VAGINALLY AND LEAVE IN PLACE FOR 3 CONSECUTIVE WEEKS, THEN REMOVE FOR 1 WEEK. 1 each 11  . amphetamine-dextroamphetamine (ADDERALL) 20 MG tablet Take 1 tablet (20 mg total) by mouth 2 (two) times daily. 60 tablet 0  . amphetamine-dextroamphetamine (ADDERALL) 20 MG tablet Take 1 tablet (20 mg total) by mouth 2 (two) times daily. 60 tablet 0   No current facility-administered medications for this visit.    Previous Psychotropic Medications:  Medication Dose   Currently on Wellbutrin XL 150 g every morning and clonazepam 0.5 mg 3 times a day                        Substance Abuse History in the last 12 months: Substance Age of 1st Use Last Use Amount Specific Type  Nicotine    smokes one half pack of cigarettes per day  Alcohol      Cannabis      Opiates      Cocaine      Methamphetamines      LSD      Ecstasy      Benzodiazepines      Caffeine      Inhalants      Others:                          Medical Consequences of Substance Abuse: none  Legal Consequences of Substance Abuse: none  Family Consequences of Substance Abuse: none  Blackouts:  No DT's:  No Withdrawal Symptoms:  No None  Social History: Current Place of Residence: [place] [address] of Birth: [place] Family Members: Mother sister and niece. Parents are divorced and father lives in [place] with his wife and her 30 year old brother Marital Status:  Single Children:   Sons:   Daughters:  Relationships: Engaged Education:  [employer] Problems/Performance:  Religious Beliefs/Practices: none History of Abuse: none Armed forces technical officer; Armed forces training and education officer History:  None. Legal History:  none Hobbies/Interests: TV  Family History:   Family History  Problem Relation Age of Onset  . Diabetes Mother   . Hypertension Mother   . Hyperlipidemia Mother   . Neuropathy Mother   . Diabetes Maternal Grandmother   . Hypertension Maternal Grandmother   . Heart disease Maternal Grandmother     CHF  . COPD Maternal Grandmother   . Cirrhosis Maternal Grandmother   . Anxiety disorder Maternal Grandmother   . Cancer Maternal Grandfather     lung  . Diabetes Paternal Grandmother   . Diabetes Paternal Grandfather   . Aneurysm Paternal Grandfather   . Anxiety disorder Paternal Grandfather   . Stroke Other   . Drug abuse Father   . ADD / ADHD Brother   . Anxiety disorder Maternal Uncle   . Anxiety disorder Paternal Uncle   . Depression Paternal Uncle   . Anxiety disorder Maternal Aunt     Mental Status Examination/Evaluation: Objective:  Appearance: Casual and Well Groomed  Eye Contact::  Good  Speech:  Clear and Coherent  Volume:  Normal  Mood:good  Affect:  bright  Thought Process:  Goal Directed  Orientation:  Full (Time, Place, and Person)  Thought Content:  Rumination  Suicidal Thoughts:  No  Homicidal Thoughts:  No  Judgement:  Good  Insight:  Good  Psychomotor Activity:  Normal  Akathisia:  No  Handed:  Right  AIMS (if indicated):    Assets:  Communication Skills Desire for Improvement Resilience Social Support Talents/Skills Vocational/Educational    Laboratory/X-Ray Psychological Evaluation(s)   Reviewed in chart     Assessment:  Axis I: Generalized Anxiety Disorder  AXIS I Generalized Anxiety Disorder  AXIS II Deferred  AXIS III Past Medical History  Diagnosis Date  . Abscess     right buttocks/thigh  . DUB (dysfunctional uterine bleeding) 05/25/2012  . Polycystic ovaries   . Hydradenitis      AXIS IV other psychosocial or environmental problems  AXIS V 51-60 moderate symptoms   Treatment Plan/Recommendations:  Plan of Care:  Medication management   Laboratory:    Psychotherapy: The patient will start therapy here   Medications: , the patient will continue  Lexapro 20 mg daily for anxiety and depression She will also continue clonazepam to 1 mg 3 times a day as needed for anxiety symptoms and Adderall  20 mg twice a day to help with ADHD symptoms   Routine PRN Medications:  Yes  Consultations:   Safety Concerns:  She denies thoughts of hurting self or others   Other: She will return in 3 months     [name], MD 1/4/201711:13 AM

## 2015-02-18 ENCOUNTER — Telehealth (HOSPITAL_COMMUNITY): Payer: Self-pay | Admitting: *Deleted

## 2015-02-18 ENCOUNTER — Other Ambulatory Visit (HOSPITAL_COMMUNITY): Payer: Self-pay | Admitting: Psychiatry

## 2015-02-18 MED ORDER — ESCITALOPRAM OXALATE 20 MG PO TABS: 20.0000 mg | ORAL_TABLET | Freq: Every day | ORAL | Status: DC

## 2015-02-18 NOTE — Telephone Encounter (Signed)
Pt pharmacy requesting 90 days supply for pt Escitalopram 20 mg QD. Pt pharmacy is [facility] and number is [phone]. Pt medication was last filled for 30 days supply on 01-16-15 with 2 refills.

## 2015-02-18 NOTE — Telephone Encounter (Signed)
noted 

## 2015-02-18 NOTE — Telephone Encounter (Signed)
done

## 2015-03-02 ENCOUNTER — Emergency Department (HOSPITAL_COMMUNITY)
Admission: EM | Admit: 2015-03-02 | Discharge: 2015-03-02 | Disposition: A | Discharge status: Home / Self Care | Payer: BLUE CROSS/BLUE SHIELD | Attending: Emergency Medicine | Admitting: Emergency Medicine

## 2015-03-02 ENCOUNTER — Encounter (HOSPITAL_COMMUNITY): Payer: Self-pay

## 2015-03-02 DIAGNOSIS — Z79899 Other long term (current) drug therapy: Secondary | ICD-10-CM | POA: Diagnosis not present

## 2015-03-02 DIAGNOSIS — Z8742 Personal history of other diseases of the female genital tract: Secondary | ICD-10-CM | POA: Insufficient documentation

## 2015-03-02 DIAGNOSIS — Z880 Allergy status to penicillin: Secondary | ICD-10-CM | POA: Diagnosis not present

## 2015-03-02 DIAGNOSIS — F17210 Nicotine dependence, cigarettes, uncomplicated: Secondary | ICD-10-CM | POA: Diagnosis not present

## 2015-03-02 DIAGNOSIS — Z872 Personal history of diseases of the skin and subcutaneous tissue: Secondary | ICD-10-CM | POA: Insufficient documentation

## 2015-03-02 DIAGNOSIS — H109 Unspecified conjunctivitis: Secondary | ICD-10-CM | POA: Insufficient documentation

## 2015-03-02 DIAGNOSIS — Z8639 Personal history of other endocrine, nutritional and metabolic disease: Secondary | ICD-10-CM | POA: Diagnosis not present

## 2015-03-02 DIAGNOSIS — H5712 Ocular pain, left eye: Secondary | ICD-10-CM | POA: Diagnosis present

## 2015-03-02 MED ORDER — IBUPROFEN 800 MG PO TABS
800.0000 mg | ORAL_TABLET | Freq: Once | ORAL | Status: AC
  Administered 2015-03-02: 800 mg via ORAL
  Filled 2015-03-02: qty 1

## 2015-03-02 MED ORDER — TOBRAMYCIN 0.3 % OP SOLN
2.0000 [drp] | Freq: Once | OPHTHALMIC | Status: AC
  Administered 2015-03-02: 2 [drp] via OPHTHALMIC
  Filled 2015-03-02: qty 5

## 2015-03-02 NOTE — Discharge Instructions (Signed)
Please apply cool compress to the right eye 3 or 4 times daily. Use 2 tobramycin drops every 4 hours for 5 days. Wash hands frequently. Keep your distance from others.

## 2015-03-02 NOTE — ED Provider Notes (Signed)
CSN: [id number]     Arrival date & time 03/02/15  1421 History   First MD Initiated Contact with Patient 03/02/15 1606     Chief Complaint  Patient presents with  . Eye Pain     (Consider location/radiation/quality/duration/timing/severity/associated sxs/prior Treatment) Patient is a 25 y.o. female presenting with eye pain. The history is provided by the patient.  Eye Pain This is a new problem. The current episode started yesterday. The problem has been gradually worsening. Pertinent negatives include no fever, rash or vomiting. Nothing aggravates the symptoms. She has tried nothing for the symptoms. The treatment provided no relief.    Past Medical History  Diagnosis Date  . Abscess     right buttocks/thigh  . DUB (dysfunctional uterine bleeding) 05/25/2012  . Polycystic ovaries   . Hydradenitis    Past Surgical History  Procedure Laterality Date  . Hernia repair    . Tonsillectomy    . Adenoidectomy    . Hydradenitis excision  10/02/2011    Procedure: EXCISION HYDRADENITIS AXILLA;  Surgeon: Fabio Bering, MD;  Location: [facility];  Service: General;  Laterality: Right;  Right Axillary Dissection  . Cholecystectomy  01/27/2012    Procedure: LAPAROSCOPIC CHOLECYSTECTOMY;  Surgeon: Fabio Bering, MD;  Location: [facility];  Service: General;  Laterality: N/A;  . Hydradenitis excision Left 10/19/2013    Procedure: INCISION OF HIDRADENITIS SUPPURATIVA LEFT AXILLA;  Surgeon: Marlane Hatcher, MD;  Location: [facility];  Service: General;  Laterality: Left;   Family History  Problem Relation Age of Onset  . Diabetes Mother   . Hypertension Mother   . Hyperlipidemia Mother   . Neuropathy Mother   . Diabetes Maternal Grandmother   . Hypertension Maternal Grandmother   . Heart disease Maternal Grandmother     CHF  . COPD Maternal Grandmother   . Cirrhosis Maternal Grandmother   . Anxiety disorder Maternal Grandmother   . Cancer Maternal Grandfather     lung  . Diabetes Paternal  Grandmother   . Diabetes Paternal Grandfather   . Aneurysm Paternal Grandfather   . Anxiety disorder Paternal Grandfather   . Stroke Other   . Drug abuse Father   . ADD / ADHD Brother   . Anxiety disorder Maternal Uncle   . Anxiety disorder Paternal Uncle   . Depression Paternal Uncle   . Anxiety disorder Maternal Aunt    Social History  Substance Use Topics  . Smoking status: Current Every Day Smoker -- 0.50 packs/day for 7 years    Types: Cigarettes  . Smokeless tobacco: Never Used  . Alcohol Use: No     Comment: social use . 01-19-14 per pt no   OB History    Gravida Para Term Preterm AB TAB SAB Ectopic Multiple Living   0              Review of Systems  Constitutional: Negative for fever.  Eyes: Positive for pain, discharge and redness. Negative for photophobia.  Gastrointestinal: Negative for vomiting.  Skin: Negative for rash.  All other systems reviewed and are negative.     Allergies  Bactrim; Ciprofloxacin; Other; Penicillins; and Percocet  Home Medications   Prior to Admission medications   Medication Sig Start Date End Date Taking? Authorizing Provider  acetaminophen (TYLENOL) 500 MG tablet Take 1,000 mg by mouth every 6 (six) hours as needed for moderate pain.    Historical Provider, MD  amphetamine-dextroamphetamine (ADDERALL) 20 MG tablet Take 1 tablet (20 mg total)  by mouth 2 (two) times daily. 01/16/15 01/16/16  Myrlene Broker, MD  amphetamine-dextroamphetamine (ADDERALL) 20 MG tablet Take 1 tablet (20 mg total) by mouth 2 (two) times daily. 01/16/15 01/16/16  Myrlene Broker, MD  amphetamine-dextroamphetamine (ADDERALL) 20 MG tablet Take 1 tablet (20 mg total) by mouth 2 (two) times daily. 01/16/15 01/16/16  Myrlene Broker, MD  clonazePAM (KLONOPIN) 1 MG tablet Take 1 tablet (1 mg total) by mouth 3 (three) times daily as needed for anxiety. 01/16/15 01/16/16  Myrlene Broker, MD  escitalopram (LEXAPRO) 20 MG tablet Take 1 tablet (20 mg total) by mouth daily. 02/18/15 02/18/16   Myrlene Broker, MD  ibuprofen (ADVIL,MOTRIN) 200 MG tablet Take 600 mg by mouth every 6 (six) hours as needed for moderate pain.    Historical Provider, MD  NUVARING 0.12-0.015 MG/24HR vaginal ring INSERT VAGINALLY AND LEAVE IN PLACE FOR 3 CONSECUTIVE WEEKS, THEN REMOVE FOR 1 WEEK. 10/15/14   Peggy Constant, MD   BP 127/78 mmHg  Pulse 73  Temp(Src) 98.6 F (37 C) (Oral)  Resp 16  Ht  (1.626 m)  Wt 98.431 kg  BMI 37.23 kg/m2  SpO2 100%  LMP  Physical Exam  Constitutional: She is oriented to person, place, and time. She appears well-developed and well-nourished.  Non-toxic appearance.  HENT:  Head: Normocephalic.  Right Ear: Tympanic membrane and external ear normal.  Left Ear: Tympanic membrane and external ear normal.  Eyes: EOM and lids are normal. Pupils are equal, round, and reactive to light. Right eye exhibits no chemosis, no discharge and no hordeolum. No foreign body present in the right eye. Left eye exhibits no chemosis, no discharge and no hordeolum. No foreign body present in the left eye. Right conjunctiva is injected. Right conjunctiva has no hemorrhage. Left conjunctiva is not injected. Left conjunctiva has no hemorrhage. No scleral icterus.  Neck: Normal range of motion. Neck supple. Carotid bruit is not present.  Cardiovascular: Normal rate, regular rhythm, normal heart sounds, intact distal pulses and normal pulses.   Pulmonary/Chest: Breath sounds normal. No respiratory distress.  Abdominal: Soft. Bowel sounds are normal. There is no tenderness. There is no guarding.  Musculoskeletal: Normal range of motion.  Lymphadenopathy:       Head (right side): No submandibular adenopathy present.       Head (left side): No submandibular adenopathy present.    She has no cervical adenopathy.  Neurological: She is alert and oriented to person, place, and time. She has normal strength. No cranial nerve deficit or sensory deficit.  Skin: Skin is warm and dry.  Psychiatric: She  has a normal mood and affect. Her speech is normal.  Nursing note and vitals reviewed.   ED Course  Procedures (including critical care time) Labs Review Labs Reviewed - No data to display  Imaging Review No results found. I have personally reviewed and evaluated these images and lab results as part of my medical decision-making.   EKG Interpretation None      MDM The exam favors conjunctivitis. Pt treated with tobramycin and cool compress. Work note provided.   Final diagnoses:  Conjunctivitis of right eye    **I have reviewed nursing notes, vital signs, and all appropriate lab and imaging results for this patient.Ivery Quale, PA-C 03/02/15 1638  Donnetta Hutching, MD 03/02/15 [phone]

## 2015-03-02 NOTE — ED Notes (Signed)
Patient states she woke up yesterday and her right eye was crusted over. It was crusted over again this am and it became painful and started draining. Noticeable swelling to right eye.

## 2015-03-06 ENCOUNTER — Emergency Department (HOSPITAL_COMMUNITY): Payer: BLUE CROSS/BLUE SHIELD

## 2015-03-06 ENCOUNTER — Encounter (HOSPITAL_COMMUNITY): Payer: Self-pay | Admitting: Emergency Medicine

## 2015-03-06 ENCOUNTER — Emergency Department (HOSPITAL_COMMUNITY)
Admission: EM | Admit: 2015-03-06 | Discharge: 2015-03-06 | Disposition: A | Discharge status: Home / Self Care | Payer: BLUE CROSS/BLUE SHIELD | Attending: Emergency Medicine | Admitting: Emergency Medicine

## 2015-03-06 DIAGNOSIS — H15001 Unspecified scleritis, right eye: Secondary | ICD-10-CM | POA: Diagnosis not present

## 2015-03-06 DIAGNOSIS — F17210 Nicotine dependence, cigarettes, uncomplicated: Secondary | ICD-10-CM | POA: Insufficient documentation

## 2015-03-06 DIAGNOSIS — Z79899 Other long term (current) drug therapy: Secondary | ICD-10-CM | POA: Diagnosis not present

## 2015-03-06 DIAGNOSIS — H5711 Ocular pain, right eye: Secondary | ICD-10-CM | POA: Diagnosis present

## 2015-03-06 DIAGNOSIS — H578 Other specified disorders of eye and adnexa: Secondary | ICD-10-CM | POA: Insufficient documentation

## 2015-03-06 DIAGNOSIS — R51 Headache: Secondary | ICD-10-CM | POA: Diagnosis not present

## 2015-03-06 DIAGNOSIS — Z888 Allergy status to other drugs, medicaments and biological substances status: Secondary | ICD-10-CM | POA: Diagnosis not present

## 2015-03-06 DIAGNOSIS — H538 Other visual disturbances: Secondary | ICD-10-CM | POA: Diagnosis not present

## 2015-03-06 LAB — POC URINE PREG, ED: Preg Test, Ur: NEGATIVE

## 2015-03-06 MED ORDER — HYDROCODONE-ACETAMINOPHEN 5-325 MG PO TABS
1.0000 | ORAL_TABLET | Freq: Once | ORAL | Status: AC
  Administered 2015-03-06: 1 via ORAL
  Filled 2015-03-06: qty 1

## 2015-03-06 MED ORDER — FLUORESCEIN SODIUM 1 MG OP STRP
1.0000 | ORAL_STRIP | Freq: Once | OPHTHALMIC | Status: AC
  Administered 2015-03-06: 1 via OPHTHALMIC
  Filled 2015-03-06: qty 1

## 2015-03-06 MED ORDER — TROPICAMIDE 1 % OP SOLN
1.0000 [drp] | Freq: Once | OPHTHALMIC | Status: AC
  Administered 2015-03-06: 1 [drp] via OPHTHALMIC
  Filled 2015-03-06: qty 3

## 2015-03-06 MED ORDER — TETRACAINE HCL 0.5 % OP SOLN
1.0000 [drp] | Freq: Once | OPHTHALMIC | Status: AC
  Administered 2015-03-06: 1 [drp] via OPHTHALMIC
  Filled 2015-03-06: qty 4

## 2015-03-06 MED ORDER — IOHEXOL 300 MG/ML  SOLN
75.0000 mL | Freq: Once | INTRAMUSCULAR | Status: AC | PRN
  Administered 2015-03-06: 75 mL via INTRAVENOUS

## 2015-03-06 NOTE — ED Provider Notes (Signed)
CSN: [id number]     Arrival date & time 03/06/15  1513 History   First MD Initiated Contact with Patient 03/06/15 1752     Chief Complaint  Patient presents with  . Eye Pain     (Consider location/radiation/quality/duration/timing/severity/associated sxs/prior Treatment) Patient is a 25 y.o. female presenting with eye problem.  Eye Problem Location:  R eye Quality:  Pulsating, tearing and sharp Severity:  Mild Onset quality:  Gradual Duration:  5 days Timing:  Constant Progression:  Worsening Chronicity:  New Context: not burn and not foreign body   Ineffective treatments:  Eye drops Associated symptoms: blurred vision, decreased vision, discharge, facial rash, headaches, photophobia and redness     Past Medical History  Diagnosis Date  . Abscess     right buttocks/thigh  . DUB (dysfunctional uterine bleeding) 05/25/2012  . Polycystic ovaries   . Hydradenitis    Past Surgical History  Procedure Laterality Date  . Hernia repair    . Tonsillectomy    . Adenoidectomy    . Hydradenitis excision  10/02/2011    Procedure: EXCISION HYDRADENITIS AXILLA;  Surgeon: Fabio Bering, MD;  Location: [facility];  Service: General;  Laterality: Right;  Right Axillary Dissection  . Cholecystectomy  01/27/2012    Procedure: LAPAROSCOPIC CHOLECYSTECTOMY;  Surgeon: Fabio Bering, MD;  Location: [facility];  Service: General;  Laterality: N/A;  . Hydradenitis excision Left 10/19/2013    Procedure: INCISION OF HIDRADENITIS SUPPURATIVA LEFT AXILLA;  Surgeon: Marlane Hatcher, MD;  Location: [facility];  Service: General;  Laterality: Left;   Family History  Problem Relation Age of Onset  . Diabetes Mother   . Hypertension Mother   . Hyperlipidemia Mother   . Neuropathy Mother   . Diabetes Maternal Grandmother   . Hypertension Maternal Grandmother   . Heart disease Maternal Grandmother     CHF  . COPD Maternal Grandmother   . Cirrhosis Maternal Grandmother   . Anxiety disorder Maternal  Grandmother   . Cancer Maternal Grandfather     lung  . Diabetes Paternal Grandmother   . Diabetes Paternal Grandfather   . Aneurysm Paternal Grandfather   . Anxiety disorder Paternal Grandfather   . Stroke Other   . Drug abuse Father   . ADD / ADHD Brother   . Anxiety disorder Maternal Uncle   . Anxiety disorder Paternal Uncle   . Depression Paternal Uncle   . Anxiety disorder Maternal Aunt    Social History  Substance Use Topics  . Smoking status: Current Every Day Smoker -- 0.50 packs/day for 7 years    Types: Cigarettes  . Smokeless tobacco: Never Used  . Alcohol Use: No     Comment: social use . 01-19-14 per pt no   OB History    Gravida Para Term Preterm AB TAB SAB Ectopic Multiple Living   0              Review of Systems  Eyes: Positive for blurred vision, photophobia, discharge and redness.  Neurological: Positive for headaches.  All other systems reviewed and are negative.     Allergies  Bactrim; Ciprofloxacin; Other; Penicillins; and Percocet  Home Medications   Prior to Admission medications   Medication Sig Start Date End Date Taking? Authorizing Provider  acetaminophen (TYLENOL) 500 MG tablet Take 1,000 mg by mouth every 6 (six) hours as needed for moderate pain.   Yes Historical Provider, MD  amphetamine-dextroamphetamine (ADDERALL) 20 MG tablet Take 1 tablet (20 mg total)  by mouth 2 (two) times daily. 01/16/15 01/16/16 Yes Myrlene Broker, MD  clonazePAM (KLONOPIN) 1 MG tablet Take 1 tablet (1 mg total) by mouth 3 (three) times daily as needed for anxiety. 01/16/15 01/16/16 Yes Myrlene Broker, MD  escitalopram (LEXAPRO) 20 MG tablet Take 1 tablet (20 mg total) by mouth daily. 02/18/15 02/18/16 Yes Myrlene Broker, MD  fluorometholone (FML) 0.1 % ophthalmic suspension Place 1 drop into the right eye 2 (two) times daily. 03/04/15  Yes Historical Provider, MD  ibuprofen (ADVIL,MOTRIN) 200 MG tablet Take 600 mg by mouth every 6 (six) hours as needed for moderate pain.   Yes  Historical Provider, MD  NUVARING 0.12-0.015 MG/24HR vaginal ring INSERT VAGINALLY AND LEAVE IN PLACE FOR 3 CONSECUTIVE WEEKS, THEN REMOVE FOR 1 WEEK. 10/15/14  Yes Peggy Constant, MD  trimethoprim-polymyxin b (POLYTRIM) ophthalmic solution Place 1 drop into the right eye 2 (two) times daily. 03/04/15  Yes Historical Provider, MD  amphetamine-dextroamphetamine (ADDERALL) 20 MG tablet Take 1 tablet (20 mg total) by mouth 2 (two) times daily. Patient not taking: Reported on 03/06/2015 01/16/15 01/16/16  Myrlene Broker, MD  amphetamine-dextroamphetamine (ADDERALL) 20 MG tablet Take 1 tablet (20 mg total) by mouth 2 (two) times daily. Patient not taking: Reported on 03/06/2015 01/16/15 01/16/16  Myrlene Broker, MD   BP 126/78 mmHg  Pulse 61  Temp(Src) 99.7 F (37.6 C) (Oral)  Resp 14  Ht  (1.626 m)  Wt 217 lb (98.431 kg)  BMI 37.23 kg/m2  SpO2 100% Physical Exam  Constitutional: She appears well-developed and well-nourished.  HENT:  Head: Normocephalic and atraumatic.  Eyes: EOM are normal. Pupils are equal, round, and reactive to light. Lids are everted and swept, no foreign bodies found. Right eye exhibits discharge (clear watery). Left eye exhibits no discharge. Right conjunctiva is injected. Left conjunctiva is not injected.  Fundoscopic exam:      The right eye shows no exudate, no hemorrhage and no papilledema.  Slit lamp exam:      The right eye shows no corneal abrasion, no corneal flare, no corneal ulcer, no foreign body and no fluorescein uptake.       The left eye shows no fluorescein uptake.  R eye 20/200 L eye 20/70 Both uncorrected Similar pressures, not grossly over pressured in either eye  Neck: Normal range of motion.  Cardiovascular: Normal rate and regular rhythm.   Pulmonary/Chest: No stridor. No respiratory distress.  Abdominal: She exhibits no distension.  Neurological: She is alert.  Nursing note and vitals reviewed.   ED Course  Procedures (including critical care  time) Labs Review Labs Reviewed  POC URINE PREG, ED    Imaging Review Ct Orbits W/cm  03/06/2015  CLINICAL DATA:  Right eye pain, redness and blurred vision with a pressure sensation and headache. EXAM: CT ORBITS WITH CONTRAST TECHNIQUE: Multidetector CT imaging of the orbits was performed following the bolus administration of intravenous contrast. CONTRAST:  75mL OMNIPAQUE IOHEXOL 300 MG/ML  SOLN COMPARISON:  Head CT dated 06/07/2014 FINDINGS: The orbital contents and periorbital soft tissues have normal appearances bilaterally. No mass or abscess seen. Unremarkable bones. Periapical lucencies involving multiple upper teeth bilaterally. Mild mucosal thickening in the inferior aspects of both maxillary sinuses, right greater than left. IMPRESSION: 1. No evidence of orbital or periorbital abscess or cellulitis. 2. Multiple bilateral upper tooth periapical lucencies. These are concerning for periapical abscesses. 3. Mild chronic bilateral maxillary sinusitis. Electronically Signed   By: Viviann Spare  Azucena Kuba M.D.   On: 03/06/2015 19:21   I have personally reviewed and evaluated these images and lab results as part of my medical decision-making.   EKG Interpretation None      MDM   Final diagnoses:  Scleritis, right    25 yo dx with conjunctivitis on Saturday, started on topical abx, saw ophthalmologist on Monday, started on different abx and steroid drops. Worsening vision and surrounding discomfort since that time. Exam as above. Doubt glaucoma, open globe or endopthalmitis at this time.  D/w Dr. Lita Mains who thought it to be likely scleritis based on the description and that close follow up was appropriate, screening for connective tissue disorders done without any obvious red flags. Started on dilating agents, will fu w/ her ophthalmologist tomorrow or Dr. Lita Mains as appropriate.     Marily Memos, MD 03/07/15 [phone]

## 2015-03-06 NOTE — ED Notes (Signed)
Pt was seen in ED Saturday and dx with pink eye in right eye, no relief from eye drops. Pt seen by eye doctor on Monday and dx with a different bacterial infection to eye-started on antibacterial and steroid eye drops. Pt states her sx are getting worse and she could not get back in with her eye doctor today. Pt reports blurred vision/pain/redness to right eye with pressure and ha.

## 2015-04-08 ENCOUNTER — Telehealth (HOSPITAL_COMMUNITY): Payer: Self-pay | Admitting: *Deleted

## 2015-04-16 ENCOUNTER — Encounter (HOSPITAL_COMMUNITY): Payer: Self-pay | Admitting: Psychiatry

## 2015-04-16 ENCOUNTER — Ambulatory Visit (INDEPENDENT_AMBULATORY_CARE_PROVIDER_SITE_OTHER): Payer: BLUE CROSS/BLUE SHIELD | Admitting: Psychiatry

## 2015-04-16 VITALS — BP 130/77 | HR 75 | Ht 64.0 in | Wt 228.8 lb

## 2015-04-16 DIAGNOSIS — F909 Attention-deficit hyperactivity disorder, unspecified type: Secondary | ICD-10-CM

## 2015-04-16 DIAGNOSIS — F988 Other specified behavioral and emotional disorders with onset usually occurring in childhood and adolescence: Secondary | ICD-10-CM

## 2015-04-16 DIAGNOSIS — F411 Generalized anxiety disorder: Secondary | ICD-10-CM | POA: Diagnosis not present

## 2015-04-16 MED ORDER — ESCITALOPRAM OXALATE 20 MG PO TABS: 20.0000 mg | ORAL_TABLET | Freq: Every day | ORAL | Status: DC

## 2015-04-16 MED ORDER — AMPHETAMINE-DEXTROAMPHETAMINE 20 MG PO TABS: 20.0000 mg | ORAL_TABLET | Freq: Two times a day (BID) | ORAL | Status: DC

## 2015-04-16 MED ORDER — CLONAZEPAM 1 MG PO TABS: 1.0000 mg | ORAL_TABLET | Freq: Three times a day (TID) | ORAL | Status: DC | PRN

## 2015-04-16 NOTE — Progress Notes (Signed)
Patient ID: Kathleen Velasquez, female   DOB: [birth year], 25 y.o.   MRN: [id number] Patient ID: AMBERMARIE HONEYMAN, female   DOB: 01/04/1991, 25 y.o.   MRN: [id number] Patient ID: ELDRED LIEVANOS, female   DOB: [birth year], 25 y.o.   MRN: [id number] Patient ID: KANIESHA BARILE, female   DOB: [birth year], 25 y.o.   MRN: [id number] Patient ID: ARKIE TAGLIAFERRO, female   DOB: June 18, 1990, 25 y.o.   MRN: [id number] Patient ID: JENNI THEW, female   DOB: [birth year], 25 y.o.   MRN: [id number] Patient ID: PERRIE RAGIN, female   DOB: 1990/12/25, 25 y.o.   MRN: [id number]  Psychiatric Assessment Adult  Patient Identification:  Kathleen Velasquez Date of Evaluation:  04/16/2015 Chief Complaint: Anxiety History of Chief Complaint:   Chief Complaint  Patient presents with  . Depression  . ADD  . Follow-up    Depression        Associated symptoms include decreased concentration.  Past medical history includes anxiety.   Anxiety Symptoms include decreased concentration and nervous/anxious behavior.     this patient is a 2 year old single white female who lives with a friend  She works at  in a nursing home  The patient was referred by Dr. Dwana Melena, her primary physician for further assessment of anxiety.  The patient states that she's been having problems with anxiety and some depression since approximately March 2015. She and her fianc been together for several years and they want to have a baby together. However she's had significant problems with heavy menstrual bleeding. She's been diagnosed with polycystic ovary disease. The bleeding has gotten so bad that her recent visit with her via gynecologist recommended hysterectomy. She's been dealing with all of this since March and is been very depressed and despondent about it. Her primary physician is started her on Wellbutrin which has not helped.  The patient is also under stress regarding her 69-year-old niece. She states that her 36 year old sister, the niece's mother,  does not seem at all responsible for taking care of a child so she and her mother do all the childcare and also the financial responsibilities for the child. She feels overwhelmed by all of this plus her job. She does not sleep well and never has. She is also extremely anxious and when she has to do presentations at work she stutters and has panic attacks. She often has trouble focusing and goes from thing to thing without finishing. She denies suicidal ideation and her energy is fairly good. She was starting treatment at [facility] but has not been able to get a return phone call for follow-up visits. She's had no prior psychiatric treatment and no history of substance abuse.  The patient returns after 3 months. Overall she is doing much better. 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 She is going to take courses through [employer] to start the prerequisites for an LPN. She denies being depressed and rarely has panic attacks. She is very focused Review of Systems  Constitutional: Negative.   HENT: Negative.   Eyes: Negative.   Respiratory: Negative.   Cardiovascular: Negative.   Gastrointestinal: Negative.   Endocrine: Negative.   Genitourinary: Positive for vaginal bleeding and pelvic pain.  Musculoskeletal: Positive for back pain.  Allergic/Immunologic: Negative.   Neurological: Negative.   Hematological: Negative.   Psychiatric/Behavioral: Positive for depression, sleep disturbance, dysphoric mood and decreased concentration. The patient is nervous/anxious.  Physical Exam not done  Depressive Symptoms: depressed mood, anhedonia, insomnia, hopelessness, anxiety, panic attacks,  (Hypo) Manic Symptoms:   Elevated Mood:  No Irritable Mood:  Yes Grandiosity:  No Distractibility:  Yes Labiality of Mood:  No Delusions:  No Hallucinations:  No Impulsivity:  No Sexually Inappropriate Behavior:  No Financial Extravagance:  No Flight of Ideas:   No  Anxiety Symptoms: Excessive Worry:  Yes Panic Symptoms:  Yes Agoraphobia:  No Obsessive Compulsive: No  Symptoms: None, Specific Phobias:  No Social Anxiety:  Yes  Psychotic Symptoms:  Hallucinations: No None Delusions:  No Paranoia:  No   Ideas of Reference:  No  PTSD Symptoms: Ever had a traumatic exposure:  No Had a traumatic exposure in the last month:  No Re-experiencing: No None Hypervigilance:  No Hyperarousal: No None Avoidance: No None  Traumatic Brain Injury: No   Past Psychiatric History: Diagnosis: Generalized anxiety disorder   Hospitalizations: None   Outpatient Care: Has only seen the therapist a couple of times   Substance Abuse Care: none  Self-Mutilation: none  Suicidal Attempts: none  Violent Behaviors: none   Past Medical History:   Past Medical History  Diagnosis Date  . Abscess     right buttocks/thigh  . DUB (dysfunctional uterine bleeding) 05/25/2012  . Polycystic ovaries   . Hydradenitis    History of Loss of Consciousness:  No Seizure History:  No Cardiac History:  No Allergies:   Allergies  Allergen Reactions  . Bactrim Hives and Itching  . Ciprofloxacin Hives and Itching    [facility] Discontinued preop antibiotic  IVPB  Cipro in OR after developed itching and hives left arm.  Received Benadryl with relief noted. Dr.Gonzalez and Dr. Leticia Penna informed. DDallasRN  . Other Other (See Comments)    [place] causes a hives, swelling, itching, and anaphylaxis.   Marland Kitchen Penicillins Hives and Itching    Has patient had a PCN reaction causing immediate rash, facial/tongue/throat swelling, SOB or lightheadedness with hypotension: Yes Has patient had a PCN reaction causing severe rash involving mucus membranes or skin necrosis: No Has patient had a PCN reaction that required hospitalization No Has patient had a PCN reaction occurring within the last 10 years: No If all of the above answers are "NO", then may proceed with Cephalosporin use.   Marland Kitchen  Percocet [Oxycodone-Acetaminophen] Other (See Comments)    Face flushes   Current Medications:  Current Outpatient Prescriptions  Medication Sig Dispense Refill  . acetaminophen (TYLENOL) 500 MG tablet Take 1,000 mg by mouth every 6 (six) hours as needed for moderate pain.    Marland Kitchen amphetamine-dextroamphetamine (ADDERALL) 20 MG tablet Take 1 tablet (20 mg total) by mouth 2 (two) times daily. 60 tablet 0  . amphetamine-dextroamphetamine (ADDERALL) 20 MG tablet Take 1 tablet (20 mg total) by mouth 2 (two) times daily. 60 tablet 0  . amphetamine-dextroamphetamine (ADDERALL) 20 MG tablet Take 1 tablet (20 mg total) by mouth 2 (two) times daily. 60 tablet 0  . clonazePAM (KLONOPIN) 1 MG tablet Take 1 tablet (1 mg total) by mouth 3 (three) times daily as needed for anxiety. 90 tablet 2  . escitalopram (LEXAPRO) 20 MG tablet Take 1 tablet (20 mg total) by mouth daily. 90 tablet 2  . ibuprofen (ADVIL,MOTRIN) 200 MG tablet Take 600 mg by mouth every 6 (six) hours as needed for moderate pain.    Marland Kitchen NUVARING 0.12-0.015 MG/24HR vaginal ring INSERT VAGINALLY AND LEAVE IN PLACE FOR 3 CONSECUTIVE WEEKS, THEN REMOVE FOR 1  WEEK. 1 each 11   No current facility-administered medications for this visit.    Previous Psychotropic Medications:  Medication Dose   Currently on Wellbutrin XL 150 g every morning and clonazepam 0.5 mg 3 times a day                        Substance Abuse History in the last 12 months: Substance Age of 1st Use Last Use Amount Specific Type  Nicotine    smokes one half pack of cigarettes per day    Alcohol      Cannabis      Opiates      Cocaine      Methamphetamines      LSD      Ecstasy      Benzodiazepines      Caffeine      Inhalants      Others:                          Medical Consequences of Substance Abuse: none  Legal Consequences of Substance Abuse: none  Family Consequences of Substance Abuse: none  Blackouts:  No DT's:  No Withdrawal Symptoms:  No  None  Social History: Current Place of Residence: [place] [address] of Birth: [place] Family Members: Mother sister and niece. Parents are divorced and father lives in [place] with his wife and her 71 year old brother Marital Status:  Single Children:   Sons:   Daughters:  Relationships: Engaged Education:  [employer] Problems/Performance:  Religious Beliefs/Practices: none History of Abuse: none Armed forces technical officer; Armed forces training and education officer History:  None. Legal History: none Hobbies/Interests: TV  Family History:   Family History  Problem Relation Age of Onset  . Diabetes Mother   . Hypertension Mother   . Hyperlipidemia Mother   . Neuropathy Mother   . Diabetes Maternal Grandmother   . Hypertension Maternal Grandmother   . Heart disease Maternal Grandmother     CHF  . COPD Maternal Grandmother   . Cirrhosis Maternal Grandmother   . Anxiety disorder Maternal Grandmother   . Cancer Maternal Grandfather     lung  . Diabetes Paternal Grandmother   . Diabetes Paternal Grandfather   . Aneurysm Paternal Grandfather   . Anxiety disorder Paternal Grandfather   . Stroke Other   . Drug abuse Father   . ADD / ADHD Brother   . Anxiety disorder Maternal Uncle   . Anxiety disorder Paternal Uncle   . Depression Paternal Uncle   . Anxiety disorder Maternal Aunt     Mental Status Examination/Evaluation: Objective:  Appearance: Casual and Well Groomed  Eye Contact::  Good  Speech:  Clear and Coherent  Volume:  Normal  Mood:good  Affect:  bright  Thought Process:  Goal Directed  Orientation:  Full (Time, Place, and Person)  Thought Content:  Rumination  Suicidal Thoughts:  No  Homicidal Thoughts:  No  Judgement:  Good  Insight:  Good  Psychomotor Activity:  Normal  Akathisia:  No  Handed:  Right  AIMS (if indicated):    Assets:  Communication Skills Desire for Improvement Resilience Social  Support Talents/Skills Vocational/Educational    Laboratory/X-Ray Psychological Evaluation(s)   Reviewed in chart     Assessment:  Axis I: Generalized Anxiety Disorder  AXIS I Generalized Anxiety Disorder  AXIS II Deferred  AXIS III Past Medical History  Diagnosis Date  . Abscess  right buttocks/thigh  . DUB (dysfunctional uterine bleeding) 05/25/2012  . Polycystic ovaries   . Hydradenitis      AXIS IV other psychosocial or environmental problems  AXIS V 51-60 moderate symptoms   Treatment Plan/Recommendations:  Plan of Care: Medication management   Laboratory:    Psychotherapy: The patient will start therapy here   Medications: , the patient will continue  Lexapro 20 mg daily for anxiety and depression She will also continue clonazepam to 1 mg 3 times a day as needed for anxiety symptoms and Adderall 20 mg twice a day to help with ADHD symptoms   Routine PRN Medications:  Yes  Consultations:   Safety Concerns:  She denies thoughts of hurting self or others   Other: She will return in 3 months     Diannia RuderOSS, Ariadna Setter, MD 4/4/201711:31 AM

## 2015-06-07 DIAGNOSIS — R079 Chest pain, unspecified: Secondary | ICD-10-CM | POA: Diagnosis not present

## 2015-06-07 DIAGNOSIS — Z5321 Procedure and treatment not carried out due to patient leaving prior to being seen by health care provider: Secondary | ICD-10-CM | POA: Diagnosis not present

## 2015-06-07 DIAGNOSIS — R110 Nausea: Secondary | ICD-10-CM | POA: Diagnosis not present

## 2015-06-07 DIAGNOSIS — M549 Dorsalgia, unspecified: Secondary | ICD-10-CM | POA: Diagnosis not present

## 2015-06-07 DIAGNOSIS — M25519 Pain in unspecified shoulder: Secondary | ICD-10-CM | POA: Diagnosis not present

## 2015-07-15 ENCOUNTER — Telehealth (HOSPITAL_COMMUNITY): Payer: Self-pay | Admitting: *Deleted

## 2015-07-15 ENCOUNTER — Ambulatory Visit (HOSPITAL_COMMUNITY): Payer: Self-pay | Admitting: Psychiatry

## 2015-07-15 NOTE — Telephone Encounter (Signed)
left voice message, provider out of office. 

## 2015-07-15 NOTE — Telephone Encounter (Signed)
Called pt to cancel appt for 07-15-15 due to provider being out of office. lmtcb and number provided 

## 2015-07-15 NOTE — Telephone Encounter (Signed)
Pt was schedule for f/u today 07-15-15 but had to resch due to provider being out of office. Pt need refills for her Adderall. Medication last printed 06-15-15 with 0 refills. Pt f/u appt with provider is 08-12-15. Pt pharmacy number is [phone].

## 2015-07-15 NOTE — Telephone Encounter (Signed)
Left second voice message regarding provider out of office today.

## 2015-07-17 ENCOUNTER — Encounter (HOSPITAL_COMMUNITY): Payer: Self-pay | Admitting: *Deleted

## 2015-07-17 ENCOUNTER — Other Ambulatory Visit (HOSPITAL_COMMUNITY): Payer: Self-pay | Admitting: Psychiatry

## 2015-07-17 MED ORDER — AMPHETAMINE-DEXTROAMPHETAMINE 20 MG PO TABS: 20.0000 mg | ORAL_TABLET | Freq: Two times a day (BID) | ORAL | Status: DC

## 2015-07-17 NOTE — Progress Notes (Signed)
Pt came into office to pick up printed script that was requested. Pt picked up printed script for Adderall ID number [id number]. Pt D/L number is [id number] with expiration date of 01-17-2023. Pt showed understanding with printed script.

## 2015-07-17 NOTE — Telephone Encounter (Signed)
printed

## 2015-07-17 NOTE — Telephone Encounter (Signed)
Spoke with pt and informed her that printed script is ready for pick up. Pt verbalized understanding.

## 2015-07-24 ENCOUNTER — Telehealth (HOSPITAL_COMMUNITY): Payer: Self-pay | Admitting: *Deleted

## 2015-07-24 NOTE — Telephone Encounter (Signed)
phone call from patient.  she need refill of the clonazePAM.  [facility] [place].

## 2015-07-25 NOTE — Telephone Encounter (Signed)
Pt was schedule for f/u on 07-15-15 but was resch due to provider being out of office. Pt need refills for her Clonazepam. Medication last printed 04/16/2015 w/ 2 refills. Spoke w/ pt, pt states she has enough medication until Monday. Pt is aware provide will be out of office this week and states she can wait til Monday to pickup printed prescription for Clonazepam. Pt f/u appt with provider is 08/12/2015.Pt shows understanding.

## 2015-07-29 ENCOUNTER — Other Ambulatory Visit (HOSPITAL_COMMUNITY): Payer: Self-pay | Admitting: Psychiatry

## 2015-07-29 MED ORDER — CLONAZEPAM 1 MG PO TABS: 1.0000 mg | ORAL_TABLET | Freq: Three times a day (TID) | ORAL | Status: DC | PRN

## 2015-07-29 NOTE — Telephone Encounter (Signed)
lmtcb

## 2015-07-29 NOTE — Telephone Encounter (Signed)
printed

## 2015-07-30 NOTE — Telephone Encounter (Signed)
lmtcb

## 2015-07-31 ENCOUNTER — Encounter (HOSPITAL_COMMUNITY): Payer: Self-pay | Admitting: *Deleted

## 2015-07-31 NOTE — Telephone Encounter (Signed)
Pt walked into office and stated that she have been picking up extra shifts at work and that's what she and office have been missing each other calls. Pt did pick up printed script

## 2015-07-31 NOTE — Progress Notes (Signed)
Pt came into office to pick up her printed script for Klonopin 1 mg. Asked pt for her D/L and she stated that she do not have it due to it being at work. Pt verbalized her D/L number [id number] with expiration date of 01-17-2023. Pt also provided the last four of her ss number. Printed script ID number is [id number].

## 2015-08-04 DIAGNOSIS — M542 Cervicalgia: Secondary | ICD-10-CM | POA: Diagnosis not present

## 2015-08-04 DIAGNOSIS — S6991XA Unspecified injury of right wrist, hand and finger(s), initial encounter: Secondary | ICD-10-CM | POA: Diagnosis not present

## 2015-08-04 DIAGNOSIS — M25531 Pain in right wrist: Secondary | ICD-10-CM | POA: Diagnosis not present

## 2015-08-04 DIAGNOSIS — S161XXA Strain of muscle, fascia and tendon at neck level, initial encounter: Secondary | ICD-10-CM | POA: Diagnosis not present

## 2015-08-04 DIAGNOSIS — S66911A Strain of unspecified muscle, fascia and tendon at wrist and hand level, right hand, initial encounter: Secondary | ICD-10-CM | POA: Diagnosis not present

## 2015-08-04 DIAGNOSIS — F17200 Nicotine dependence, unspecified, uncomplicated: Secondary | ICD-10-CM | POA: Diagnosis not present

## 2015-08-04 DIAGNOSIS — S199XXA Unspecified injury of neck, initial encounter: Secondary | ICD-10-CM | POA: Diagnosis not present

## 2015-08-04 DIAGNOSIS — Z79899 Other long term (current) drug therapy: Secondary | ICD-10-CM | POA: Diagnosis not present

## 2015-08-12 ENCOUNTER — Encounter (HOSPITAL_COMMUNITY): Payer: Self-pay | Admitting: Psychiatry

## 2015-08-12 ENCOUNTER — Ambulatory Visit (INDEPENDENT_AMBULATORY_CARE_PROVIDER_SITE_OTHER): Payer: BLUE CROSS/BLUE SHIELD | Admitting: Psychiatry

## 2015-08-12 VITALS — BP 133/81 | HR 78 | Ht 64.0 in | Wt 238.6 lb

## 2015-08-12 DIAGNOSIS — F411 Generalized anxiety disorder: Secondary | ICD-10-CM | POA: Diagnosis not present

## 2015-08-12 DIAGNOSIS — F988 Other specified behavioral and emotional disorders with onset usually occurring in childhood and adolescence: Secondary | ICD-10-CM

## 2015-08-12 MED ORDER — AMPHETAMINE-DEXTROAMPHETAMINE 20 MG PO TABS: 20.0000 mg | ORAL_TABLET | Freq: Two times a day (BID) | ORAL | 0 refills | Status: DC

## 2015-08-12 MED ORDER — ESCITALOPRAM OXALATE 20 MG PO TABS: 20.0000 mg | ORAL_TABLET | Freq: Every day | ORAL | 2 refills | Status: DC

## 2015-08-12 MED ORDER — CLONAZEPAM 1 MG PO TABS: 1.0000 mg | ORAL_TABLET | Freq: Three times a day (TID) | ORAL | 2 refills | Status: DC | PRN
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
## 2015-08-13 DIAGNOSIS — S63501A Unspecified sprain of right wrist, initial encounter: Secondary | ICD-10-CM | POA: Diagnosis not present

## 2015-08-18 ENCOUNTER — Emergency Department (HOSPITAL_COMMUNITY)
Admission: EM | Admit: 2015-08-18 | Discharge: 2015-08-18 | Disposition: A | Discharge status: Home / Self Care | Payer: BLUE CROSS/BLUE SHIELD | Attending: Emergency Medicine | Admitting: Emergency Medicine

## 2015-08-18 ENCOUNTER — Encounter (HOSPITAL_COMMUNITY): Payer: Self-pay | Admitting: Emergency Medicine

## 2015-08-18 DIAGNOSIS — F17210 Nicotine dependence, cigarettes, uncomplicated: Secondary | ICD-10-CM | POA: Diagnosis not present

## 2015-08-18 DIAGNOSIS — Z79899 Other long term (current) drug therapy: Secondary | ICD-10-CM | POA: Insufficient documentation

## 2015-08-18 DIAGNOSIS — L732 Hidradenitis suppurativa: Secondary | ICD-10-CM | POA: Diagnosis not present

## 2015-08-18 DIAGNOSIS — Z791 Long term (current) use of non-steroidal anti-inflammatories (NSAID): Secondary | ICD-10-CM | POA: Diagnosis not present

## 2015-08-18 MED ORDER — CLINDAMYCIN HCL 150 MG PO CAPS
300.0000 mg | ORAL_CAPSULE | Freq: Once | ORAL | Status: AC
  Administered 2015-08-18: 300 mg via ORAL
  Filled 2015-08-18: qty 2

## 2015-08-18 MED ORDER — HYDROCODONE-ACETAMINOPHEN 5-325 MG PO TABS
1.0000 | ORAL_TABLET | Freq: Once | ORAL | Status: AC
  Administered 2015-08-18: 1 via ORAL
  Filled 2015-08-18: qty 1

## 2015-08-18 MED ORDER — CLINDAMYCIN HCL 150 MG PO CAPS: 300.0000 mg | ORAL_CAPSULE | Freq: Four times a day (QID) | ORAL | 0 refills | Status: DC

## 2015-08-18 MED ORDER — HYDROCODONE-ACETAMINOPHEN 5-325 MG PO TABS: ORAL_TABLET | ORAL | 0 refills | Status: DC

## 2015-08-18 NOTE — ED Triage Notes (Signed)
Cyst under left arm.  Rates pain 9/10.  History of cyst.

## 2015-08-18 NOTE — ED Provider Notes (Signed)
[facility] Provider Note   CSN: [id number] Arrival date & time: 08/18/15  4098  First Provider Contact:  First MD Initiated Contact with Patient 08/18/15 0932        History   Chief Complaint Chief Complaint  Patient presents with  . Cyst    under left arm    HPI Kathleen Velasquez is a 25 y.o. female.  HPI   Kathleen Velasquez is a 25 y.o. female who presents to the Emergency Department complaining of pain and recurrent "cysts" to her left underarm area.  She states that she had surgery to her left underarm for these "cysts" in 2013 that never resolved the problem.  She complains of worsening pain for 3-4 days that is associated with arm movement.  She states that she would like to have another surgeon evaluate at the area, but has been unable to arrange an appt   Past Medical History:  Diagnosis Date  . Abscess    right buttocks/thigh  . DUB (dysfunctional uterine bleeding) 05/25/2012  . Hydradenitis   . Polycystic ovaries     Patient Active Problem List   Diagnosis Date Noted  . Anovulatory (dysfunctional uterine) bleeding 06/07/2012  . DUB (dysfunctional uterine bleeding) 05/25/2012  . FOOT PAIN, CHRONIC 05/09/2007    Past Surgical History:  Procedure Laterality Date  . ADENOIDECTOMY    . CHOLECYSTECTOMY  01/27/2012   Procedure: LAPAROSCOPIC CHOLECYSTECTOMY;  Surgeon: Fabio Bering, MD;  Location: [facility];  Service: General;  Laterality: N/A;  . HERNIA REPAIR    . HYDRADENITIS EXCISION  10/02/2011   Procedure: EXCISION HYDRADENITIS AXILLA;  Surgeon: Fabio Bering, MD;  Location: [facility];  Service: General;  Laterality: Right;  Right Axillary Dissection  . HYDRADENITIS EXCISION Left 10/19/2013   Procedure: INCISION OF HIDRADENITIS SUPPURATIVA LEFT AXILLA;  Surgeon: Marlane Hatcher, MD;  Location: [facility];  Service: General;  Laterality: Left;  . TONSILLECTOMY      OB History    Gravida Para Term Preterm AB Living   0             SAB TAB Ectopic Multiple  Live Births                   Home Medications    Prior to Admission medications   Medication Sig Start Date End Date Taking? Authorizing Provider  acetaminophen (TYLENOL) 500 MG tablet Take 1,000 mg by mouth every 6 (six) hours as needed for moderate pain.    Historical Provider, MD  amphetamine-dextroamphetamine (ADDERALL) 20 MG tablet Take 1 tablet (20 mg total) by mouth 2 (two) times daily. 08/12/15 08/11/16  Myrlene Broker, MD  amphetamine-dextroamphetamine (ADDERALL) 20 MG tablet Take 1 tablet (20 mg total) by mouth 2 (two) times daily. 08/12/15 08/11/16  Myrlene Broker, MD  amphetamine-dextroamphetamine (ADDERALL) 20 MG tablet Take 1 tablet (20 mg total) by mouth 2 (two) times daily. 08/12/15 08/11/16  Myrlene Broker, MD  clonazePAM (KLONOPIN) 1 MG tablet Take 1 tablet (1 mg total) by mouth 3 (three) times daily as needed for anxiety. 08/12/15 08/11/16  Myrlene Broker, MD  escitalopram (LEXAPRO) 20 MG tablet Take 1 tablet (20 mg total) by mouth daily. 08/12/15 08/11/16  Myrlene Broker, MD  ibuprofen (ADVIL,MOTRIN) 200 MG tablet Take 600 mg by mouth every 6 (six) hours as needed for moderate pain.    Historical Provider, MD  NUVARING 0.12-0.015 MG/24HR vaginal ring INSERT VAGINALLY AND LEAVE IN PLACE FOR 3  CONSECUTIVE WEEKS, THEN REMOVE FOR 1 WEEK. 10/15/14   Catalina AntiguaPeggy Constant, MD    Family History Family History  Problem Relation Age of Onset  . Diabetes Mother   . Hypertension Mother   . Hyperlipidemia Mother   . Neuropathy Mother   . Diabetes Maternal Grandmother   . Hypertension Maternal Grandmother   . Heart disease Maternal Grandmother     CHF  . COPD Maternal Grandmother   . Cirrhosis Maternal Grandmother   . Anxiety disorder Maternal Grandmother   . Cancer Maternal Grandfather     lung  . Diabetes Paternal Grandmother   . Diabetes Paternal Grandfather   . Aneurysm Paternal Grandfather   . Anxiety disorder Paternal Grandfather   . Stroke Other   . Drug abuse Father   . ADD /  ADHD Brother   . Anxiety disorder Maternal Uncle   . Anxiety disorder Paternal Uncle   . Depression Paternal Uncle   . Anxiety disorder Maternal Aunt     Social History Social History  Substance Use Topics  . Smoking status: Current Every Day Smoker    Packs/day: 0.50    Years: 7.00    Types: Cigarettes  . Smokeless tobacco: Never Used  . Alcohol use No     Comment: social use . 01-19-14 per pt no     Allergies   Bactrim; Ciprofloxacin; Other; Penicillins; and Percocet [oxycodone-acetaminophen]   Review of Systems Review of Systems  Constitutional: Negative for chills and fever.  Respiratory: Negative for shortness of breath.   Cardiovascular: Negative for chest pain.  Gastrointestinal: Negative for nausea and vomiting.  Musculoskeletal: Negative for arthralgias and joint swelling.  Skin: Positive for color change.       Pain, redness of left axilla.   Hematological: Negative for adenopathy.  All other systems reviewed and are negative.    Physical Exam Updated Vital Signs BP 132/67 (BP Location: Right Arm)   Pulse 84   Temp 98.2 F (36.8 C) (Oral)   Resp 18   Ht 5\' 4"  (1.626 m)   Wt 100.7 kg   SpO2 98%   BMI 38.11 kg/m   Physical Exam  Constitutional: She is oriented to person, place, and time. She appears well-developed and well-nourished. No distress.  HENT:  Head: Normocephalic and atraumatic.  Cardiovascular: Normal rate and regular rhythm.   Pulmonary/Chest: Effort normal and breath sounds normal. No respiratory distress.  Neurological: She is alert and oriented to person, place, and time. She exhibits normal muscle tone. Coordination normal.  Skin: Skin is warm and dry. There is erythema.  3-4 chronic appearing areas of induration to the left axilla.  Mild surrounding erythema.  No fluctuance.   Nursing note and vitals reviewed.    ED Treatments / Results  Labs (all labs ordered are listed, but only abnormal results are displayed) Labs Reviewed  - No data to display  EKG  EKG Interpretation None       Radiology No results found.  Procedures Procedures (including critical care time)  Medications Ordered in ED Medications - No data to display   Initial Impression / Assessment and Plan / ED Course  I have reviewed the triage vital signs and the nursing notes.  Pertinent labs & imaging results that were available during my care of the patient were reviewed by me and considered in my medical decision making (see chart for details).  Clinical Course    Pt reviewed on the [place] narcotic database.  No recent narcotics filed.  Hx of hidradenitis to the bilateral axilla.  Mild drainage to the left with surrounding erythema. No drainable abscess noted on exam. Plan includes warm compresses, antibiotic and surgery f/u.  Pt agrees, stable for d/c  Final Clinical Impressions(s) / ED Diagnoses   Final diagnoses:  Hidradenitis suppurativa of left axilla    New Prescriptions New Prescriptions   No medications on file     TammyRosey Bath8/08/17 2101    Bethann Berkshire, MD 08/26/15 1226

## 2015-08-18 NOTE — Discharge Instructions (Signed)
Frequent, Warm wet compresses.  Call the surgeon listed to arrange a follow-up appt.

## 2015-08-19 DIAGNOSIS — S6991XA Unspecified injury of right wrist, hand and finger(s), initial encounter: Secondary | ICD-10-CM | POA: Diagnosis not present

## 2015-08-19 DIAGNOSIS — M25531 Pain in right wrist: Secondary | ICD-10-CM | POA: Diagnosis not present

## 2015-08-20 DIAGNOSIS — M25531 Pain in right wrist: Secondary | ICD-10-CM | POA: Diagnosis not present

## 2015-09-10 DIAGNOSIS — M25531 Pain in right wrist: Secondary | ICD-10-CM | POA: Diagnosis not present

## 2015-10-17 DIAGNOSIS — F17200 Nicotine dependence, unspecified, uncomplicated: Secondary | ICD-10-CM | POA: Diagnosis not present

## 2015-10-17 DIAGNOSIS — M79622 Pain in left upper arm: Secondary | ICD-10-CM | POA: Diagnosis not present

## 2015-10-17 DIAGNOSIS — L02412 Cutaneous abscess of left axilla: Secondary | ICD-10-CM | POA: Diagnosis not present

## 2015-10-17 DIAGNOSIS — Z79899 Other long term (current) drug therapy: Secondary | ICD-10-CM | POA: Diagnosis not present

## 2015-11-12 ENCOUNTER — Ambulatory Visit (INDEPENDENT_AMBULATORY_CARE_PROVIDER_SITE_OTHER): Payer: BLUE CROSS/BLUE SHIELD | Admitting: Psychiatry

## 2015-11-12 ENCOUNTER — Encounter (HOSPITAL_COMMUNITY): Payer: Self-pay | Admitting: Psychiatry

## 2015-11-12 VITALS — BP 126/74 | HR 76 | Ht 64.0 in | Wt 241.8 lb

## 2015-11-12 DIAGNOSIS — Z818 Family history of other mental and behavioral disorders: Secondary | ICD-10-CM

## 2015-11-12 DIAGNOSIS — Z8249 Family history of ischemic heart disease and other diseases of the circulatory system: Secondary | ICD-10-CM | POA: Diagnosis not present

## 2015-11-12 DIAGNOSIS — Z833 Family history of diabetes mellitus: Secondary | ICD-10-CM

## 2015-11-12 DIAGNOSIS — Z808 Family history of malignant neoplasm of other organs or systems: Secondary | ICD-10-CM

## 2015-11-12 DIAGNOSIS — F411 Generalized anxiety disorder: Secondary | ICD-10-CM | POA: Diagnosis not present

## 2015-11-12 DIAGNOSIS — F900 Attention-deficit hyperactivity disorder, predominantly inattentive type: Secondary | ICD-10-CM

## 2015-11-12 MED ORDER — AMPHETAMINE-DEXTROAMPHETAMINE 20 MG PO TABS: 20.0000 mg | ORAL_TABLET | Freq: Two times a day (BID) | ORAL | 0 refills | Status: DC

## 2015-11-12 MED ORDER — ESCITALOPRAM OXALATE 20 MG PO TABS: 20.0000 mg | ORAL_TABLET | Freq: Every day | ORAL | 2 refills | Status: DC

## 2015-11-12 MED ORDER — CLONAZEPAM 1 MG PO TABS: 1.0000 mg | ORAL_TABLET | Freq: Three times a day (TID) | ORAL | 2 refills | Status: DC | PRN

## 2015-11-12 NOTE — Progress Notes (Signed)
Patient ID: Kathleen Velasquez Declercq, female   DOB: 12/01/1990, 25 y.o.   MRN: [id number] Patient ID: Kathleen Velasquez [place], female   DOB: 05/15/1990, 25 y.o.   MRN: [id number] Patient ID: Kathleen Velasquez Azpeitia, female   DOB: 09/18/1990, 25 y.o.   MRN: [id number] Patient ID: Kathleen Velasquez Cadenas, female   DOB: 01/23/1990, 25 y.o.   MRN: [id number] Patient ID: Kathleen Velasquez Grefe, female   DOB: 04/25/1990, 25 y.o.   MRN: [id number] Patient ID: Kathleen Velasquez Oyen, female   DOB: 11/14/1990, 25 y.o.   MRN: [id number] Patient ID: Kathleen Velasquez Urschel, female   DOB: 10/19/1990, 25 y.o.   MRN: [id number]  Psychiatric Assessment Adult  Patient Identification:  Kathleen Velasquez Nokes Date of Evaluation:  11/12/2015 Chief Complaint: Anxiety History of Chief Complaint:   Chief Complaint  Patient presents with  . Depression  . Anxiety  . Follow-up    Depression         Associated symptoms include decreased concentration.  Past medical history includes anxiety.   Anxiety  Symptoms include decreased concentration and nervous/anxious behavior.     this patient is a 25 year old single white female who lives with a friend  She works at  in a nursing home  The patient was referred by Dr. Dwana MelenaZack Hall, her primary physician for further assessment of anxiety.  The patient states that she's been having problems with anxiety and some depression since approximately March 2015. She and her fianc been together for several years and they want to have a baby together. However she's had significant problems with heavy menstrual bleeding. She's been diagnosed with polycystic ovary disease. The bleeding has gotten so bad that her recent visit with her via gynecologist recommended hysterectomy. She's been dealing with all of this since March and is been very depressed and despondent about it. Her primary physician is started her on Wellbutrin which has not helped.  The patient is also under stress regarding her 26-year-old niece. She states that her 229 year old sister, the niece's  mother, does not seem at all responsible for taking care of a child so she and her mother do all the childcare and also the financial responsibilities for the child. She feels overwhelmed by all of this plus her job. She does not sleep well and never has. She is also extremely anxious and when she has to do presentations at work she stutters and has panic attacks. She often has trouble focusing and goes from thing to thing without finishing. She denies suicidal ideation and her energy is fairly good. She was starting treatment at [facility] counseling but has not been able to get a return phone call for follow-up visits. She's had no prior psychiatric treatment and no history of substance abuse.  The patient returns after 3 months. Overall she is doing well. 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 She thinks if she gets a job with her isn't so much lifting and movement and sweating that she will have less of these bouts. She had the same thing in the right arm and had surgery a few years ago and will probably have to have surgery on the left side but she is trying to put this off as long as possible. 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 Overall her mood is good her anxiety is under good  control and the Adderall continues to help her with focus Review of Systems  Constitutional: Negative.   HENT: Negative.   Eyes: Negative.   Respiratory: Negative.   Cardiovascular: Negative.   Gastrointestinal: Negative.   Endocrine: Negative.   Genitourinary: Positive for pelvic pain and vaginal bleeding.  Musculoskeletal: Positive for back pain.  Allergic/Immunologic: Negative.   Neurological: Negative.   Hematological: Negative.   Psychiatric/Behavioral: Positive for decreased concentration, depression, dysphoric mood and sleep disturbance. The patient is  nervous/anxious.    Physical Exam not done  Depressive Symptoms: depressed mood, anhedonia, insomnia, hopelessness, anxiety, panic attacks,  (Hypo) Manic Symptoms:   Elevated Mood:  No Irritable Mood:  Yes Grandiosity:  No Distractibility:  Yes Labiality of Mood:  No Delusions:  No Hallucinations:  No Impulsivity:  No Sexually Inappropriate Behavior:  No Financial Extravagance:  No Flight of Ideas:  No  Anxiety Symptoms: Excessive Worry:  Yes Panic Symptoms:  Yes Agoraphobia:  No Obsessive Compulsive: No  Symptoms: None, Specific Phobias:  No Social Anxiety:  Yes  Psychotic Symptoms:  Hallucinations: No None Delusions:  No Paranoia:  No   Ideas of Reference:  No  PTSD Symptoms: Ever had a traumatic exposure:  No Had a traumatic exposure in the last month:  No Re-experiencing: No None Hypervigilance:  No Hyperarousal: No None Avoidance: No None  Traumatic Brain Injury: No   Past Psychiatric History: Diagnosis: Generalized anxiety disorder   Hospitalizations: None   Outpatient Care: Has only seen the therapist a couple of times   Substance Abuse Care: none  Self-Mutilation: none  Suicidal Attempts: none  Violent Behaviors: none   Past Medical History:   Past Medical History:  Diagnosis Date  . Abscess    right buttocks/thigh  . DUB (dysfunctional uterine bleeding) 05/25/2012  . Hydradenitis   . Polycystic ovaries    History of Loss of Consciousness:  No Seizure History:  No Cardiac History:  No Allergies:   Allergies  Allergen Reactions  . Bactrim Hives and Itching  . Ciprofloxacin Hives and Itching    [facility] Discontinued preop antibiotic  IVPB  Cipro in OR after developed itching and hives left arm.  Received Benadryl with relief noted. Dr.Gonzalez and Dr. Leticia PennaZiegler informed. DDallasRN  . Other Other (See Comments)    [place] causes a hives, swelling, itching, and anaphylaxis.   Marland Kitchen. Penicillins Hives and Itching    Has patient had a PCN reaction  causing immediate rash, facial/tongue/throat swelling, SOB or lightheadedness with hypotension: Yes Has patient had a PCN reaction causing severe rash involving mucus membranes or skin necrosis: No Has patient had a PCN reaction that required hospitalization No Has patient had a PCN reaction occurring within the last 10 years: No If all of the above answers are "NO", then may proceed with Cephalosporin use.   Marland Kitchen. Percocet [Oxycodone-Acetaminophen] Other (See Comments)    Face flushes   Current Medications:  Current Outpatient Prescriptions  Medication Sig Dispense Refill  . acetaminophen (TYLENOL) 500 MG tablet Take 1,000 mg by mouth every 6 (six) hours as needed for moderate pain.    Marland Kitchen. amphetamine-dextroamphetamine (ADDERALL) 20 MG tablet Take 1 tablet (20 mg total) by mouth 2 (two) times daily. 60 tablet 0  . clonazePAM (KLONOPIN) 1 MG tablet Take 1 tablet (1 mg total) by mouth 3 (three) times daily as needed for anxiety. 90 tablet 2  . escitalopram (LEXAPRO) 20 MG tablet Take 1 tablet (20 mg total) by mouth daily. 90 tablet 2  .  ibuprofen (ADVIL,MOTRIN) 200 MG tablet Take 600 mg by mouth every 6 (six) hours as needed for moderate pain.    Marland Kitchen NUVARING 0.12-0.015 MG/24HR vaginal ring INSERT VAGINALLY AND LEAVE IN PLACE FOR 3 CONSECUTIVE WEEKS, THEN REMOVE FOR 1 WEEK. 1 each 11  . amphetamine-dextroamphetamine (ADDERALL) 20 MG tablet Take 1 tablet (20 mg total) by mouth 2 (two) times daily. 60 tablet 0  . amphetamine-dextroamphetamine (ADDERALL) 20 MG tablet Take 1 tablet (20 mg total) by mouth 2 (two) times daily. 60 tablet 0   No current facility-administered medications for this visit.     Previous Psychotropic Medications:  Medication Dose   Currently on Wellbutrin XL 150 g every morning and clonazepam 0.5 mg 3 times a day                        Substance Abuse History in the last 12 months: Substance Age of 1st Use Last Use Amount Specific Type  Nicotine    smokes one half pack  of cigarettes per day    Alcohol      Cannabis      Opiates      Cocaine      Methamphetamines      LSD      Ecstasy      Benzodiazepines      Caffeine      Inhalants      Others:                          Medical Consequences of Substance Abuse: none  Legal Consequences of Substance Abuse: none  Family Consequences of Substance Abuse: none  Blackouts:  No DT's:  No Withdrawal Symptoms:  No None  Social History: Current Place of Residence: [place] [address] of Birth: [place] Family Members: Mother sister and niece. Parents are divorced and father lives in [place] with his wife and her 69 year old brother Marital Status:  Single Children:   Sons:   Daughters:  Relationships: Engaged Education:  [employer] Problems/Performance:  Religious Beliefs/Practices: none History of Abuse: none Armed forces technical officer; Armed forces training and education officer History:  None. Legal History: none Hobbies/Interests: TV  Family History:   Family History  Problem Relation Age of Onset  . Diabetes Mother   . Hypertension Mother   . Hyperlipidemia Mother   . Neuropathy Mother   . Diabetes Maternal Grandmother   . Hypertension Maternal Grandmother   . Heart disease Maternal Grandmother     CHF  . COPD Maternal Grandmother   . Cirrhosis Maternal Grandmother   . Anxiety disorder Maternal Grandmother   . Cancer Maternal Grandfather     lung  . Diabetes Paternal Grandmother   . Diabetes Paternal Grandfather   . Aneurysm Paternal Grandfather   . Anxiety disorder Paternal Grandfather   . Drug abuse Father   . ADD / ADHD Brother   . Stroke Other   . Anxiety disorder Maternal Uncle   . Anxiety disorder Paternal Uncle   . Depression Paternal Uncle   . Anxiety disorder Maternal Aunt     Mental Status Examination/Evaluation: Objective:  Appearance: Casual and Well Groomed  Eye Contact::  Good  Speech:  Clear and Coherent   Volume:  Normal  Mood:good  Affect:  bright  Thought Process:  Goal Directed  Orientation:  Full (Time, Place, and Person)  Thought Content:  Rumination  Suicidal Thoughts:  No  Homicidal Thoughts:  No  Judgement:  Good  Insight:  Good  Psychomotor Activity:  Normal  Akathisia:  No  Handed:  Right  AIMS (if indicated):    Assets:  Communication Skills Desire for Improvement Resilience Social Support Talents/Skills Vocational/Educational    Laboratory/X-Ray Psychological Evaluation(s)   Reviewed in chart     Assessment:  Axis I: Generalized Anxiety Disorder  AXIS I Generalized Anxiety Disorder  AXIS II Deferred  AXIS III Past Medical History:  Diagnosis Date  . Abscess    right buttocks/thigh  . DUB (dysfunctional uterine bleeding) 05/25/2012  . Hydradenitis   . Polycystic ovaries      AXIS IV other psychosocial or environmental problems  AXIS V 51-60 moderate symptoms   Treatment Plan/Recommendations:  Plan of Care: Medication management   Laboratory:    Psychotherapy: The patient will start therapy here   Medications: , the patient will continue  Lexapro 20 mg daily for anxiety and depression She will also continue clonazepam to 1 mg 3 times a day as needed for anxiety symptoms and Adderall 20 mg twice a day to help with ADHD symptoms   Routine PRN Medications:  Yes  Consultations:   Safety Concerns:  She denies thoughts of hurting self or others   Other: She will return in 3 months     ROSS, Gavin Pound, MD 10/31/20179:31 AM

## 2015-11-21 ENCOUNTER — Other Ambulatory Visit: Payer: Self-pay | Admitting: Obstetrics and Gynecology

## 2016-02-12 ENCOUNTER — Ambulatory Visit (INDEPENDENT_AMBULATORY_CARE_PROVIDER_SITE_OTHER): Payer: BLUE CROSS/BLUE SHIELD | Admitting: Psychiatry

## 2016-02-12 ENCOUNTER — Encounter (HOSPITAL_COMMUNITY): Payer: Self-pay | Admitting: Psychiatry

## 2016-02-12 VITALS — BP 134/79 | HR 90 | Ht 64.0 in | Wt 250.2 lb

## 2016-02-12 DIAGNOSIS — F411 Generalized anxiety disorder: Secondary | ICD-10-CM | POA: Diagnosis not present

## 2016-02-12 DIAGNOSIS — Z880 Allergy status to penicillin: Secondary | ICD-10-CM | POA: Diagnosis not present

## 2016-02-12 DIAGNOSIS — F900 Attention-deficit hyperactivity disorder, predominantly inattentive type: Secondary | ICD-10-CM | POA: Diagnosis not present

## 2016-02-12 DIAGNOSIS — Z833 Family history of diabetes mellitus: Secondary | ICD-10-CM

## 2016-02-12 DIAGNOSIS — Z888 Allergy status to other drugs, medicaments and biological substances status: Secondary | ICD-10-CM

## 2016-02-12 DIAGNOSIS — Z79899 Other long term (current) drug therapy: Secondary | ICD-10-CM

## 2016-02-12 DIAGNOSIS — Z8249 Family history of ischemic heart disease and other diseases of the circulatory system: Secondary | ICD-10-CM

## 2016-02-12 DIAGNOSIS — Z813 Family history of other psychoactive substance abuse and dependence: Secondary | ICD-10-CM

## 2016-02-12 DIAGNOSIS — Z818 Family history of other mental and behavioral disorders: Secondary | ICD-10-CM

## 2016-02-12 MED ORDER — ESCITALOPRAM OXALATE 20 MG PO TABS: 20.0000 mg | ORAL_TABLET | Freq: Every day | ORAL | 2 refills | Status: DC

## 2016-02-12 MED ORDER — AMPHETAMINE-DEXTROAMPHETAMINE 20 MG PO TABS: 20.0000 mg | ORAL_TABLET | Freq: Two times a day (BID) | ORAL | 0 refills | Status: DC

## 2016-02-12 MED ORDER — CLONAZEPAM 1 MG PO TABS: 1.0000 mg | ORAL_TABLET | Freq: Three times a day (TID) | ORAL | 2 refills | Status: DC | PRN

## 2016-02-12 NOTE — Progress Notes (Signed)
Patient ID: Kathleen Velasquez, female   DOB: 1990-07-31, 26 y.o.   MRN: [id number] Patient ID: TOKIKO DIEFENDERFER, female   DOB: [birth year], 26 y.o.   MRN: [id number] Patient ID: DANAJA LASOTA, female   DOB: 1990-12-26, 26 y.o.   MRN: [id number] Patient ID: GORDANA KEWLEY, female   DOB: 05/15/1990, 26 y.o.   MRN: [id number] Patient ID: TORY MCKISSACK, female   DOB: Nov 15, 1990, 26 y.o.   MRN: [id number] Patient ID: AVION PATELLA, female   DOB: Feb 10, 1990, 26 y.o.   MRN: [id number] Patient ID: KHIANA CAMINO, female   DOB: 1990-04-19, 26 y.o.   MRN: [id number]  Psychiatric Assessment Adult  Patient Identification:  Kathleen Velasquez Date of Evaluation:  02/12/2016 Chief Complaint: Anxiety History of Chief Complaint:   Chief Complaint  Patient presents with  . Depression  . Anxiety  . Follow-up  . ADHD    Depression         Associated symptoms include decreased concentration.  Past medical history includes anxiety.   Anxiety  Symptoms include decreased concentration and nervous/anxious behavior.     this patient is a 63 year old single white female who lives with a friend  She works at  in a nursing home  The patient was referred by Dr. Dwana Melena, her primary physician for further assessment of anxiety.  The patient states that she's been having problems with anxiety and some depression since approximately March 2015. She and her fianc been together for several years and they want to have a baby together. However she's had significant problems with heavy menstrual bleeding. She's been diagnosed with polycystic ovary disease. The bleeding has gotten so bad that her recent visit with her via gynecologist recommended hysterectomy. She's been dealing with all of this since March and is been very depressed and despondent about it. Her primary physician is started her on Wellbutrin which has not helped.  The patient is also under stress regarding her 26-year-old niece. She states that her 31 year old sister, the  niece's mother, does not seem at all responsible for taking care of a child so she and her mother do all the childcare and also the financial responsibilities for the child. She feels overwhelmed by all of this plus her job. She does not sleep well and never has. She is also extremely anxious and when she has to do presentations at work she stutters and has panic attacks. She often has trouble focusing and goes from thing to thing without finishing. She denies suicidal ideation and her energy is fairly good. She was starting treatment at [facility] but has not been able to get a return phone call for follow-up visits. She's had no prior psychiatric treatment and no history of substance abuse.  The patient returns after 3 months. Overall she is doing well. 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 She is being paid more and she is really enjoying it. Her mood is good and her anxiety is under good control. Adderall  continues to help her focus Review of Systems  Constitutional: Negative.   HENT: Negative.   Eyes: Negative.   Respiratory: Negative.   Cardiovascular: Negative.   Gastrointestinal: Negative.   Endocrine: Negative.   Genitourinary: Positive for pelvic pain and vaginal bleeding.  Musculoskeletal: Positive for back pain.  Allergic/Immunologic: Negative.   Neurological: Negative.   Hematological: Negative.   Psychiatric/Behavioral:  Positive for decreased concentration, depression, dysphoric mood and sleep disturbance. The patient is nervous/anxious.    Physical Exam not done  Depressive Symptoms: depressed mood, anhedonia, insomnia, hopelessness, anxiety, panic attacks,  (Hypo) Manic Symptoms:   Elevated Mood:  No Irritable Mood:  Yes Grandiosity:  No Distractibility:  Yes Labiality of Mood:  No Delusions:  No Hallucinations:  No Impulsivity:  No Sexually Inappropriate  Behavior:  No Financial Extravagance:  No Flight of Ideas:  No  Anxiety Symptoms: Excessive Worry:  Yes Panic Symptoms:  Yes Agoraphobia:  No Obsessive Compulsive: No  Symptoms: None, Specific Phobias:  No Social Anxiety:  Yes  Psychotic Symptoms:  Hallucinations: No None Delusions:  No Paranoia:  No   Ideas of Reference:  No  PTSD Symptoms: Ever had a traumatic exposure:  No Had a traumatic exposure in the last month:  No Re-experiencing: No None Hypervigilance:  No Hyperarousal: No None Avoidance: No None  Traumatic Brain Injury: No   Past Psychiatric History: Diagnosis: Generalized anxiety disorder   Hospitalizations: None   Outpatient Care: Has only seen the therapist a couple of times   Substance Abuse Care: none  Self-Mutilation: none  Suicidal Attempts: none  Violent Behaviors: none   Past Medical History:   Past Medical History:  Diagnosis Date  . Abscess    right buttocks/thigh  . DUB (dysfunctional uterine bleeding) 05/25/2012  . Hydradenitis   . Polycystic ovaries    History of Loss of Consciousness:  No Seizure History:  No Cardiac History:  No Allergies:   Allergies  Allergen Reactions  . Bactrim Hives and Itching  . Ciprofloxacin Hives and Itching    CRNA Discontinued preop antibiotic  IVPB  Cipro in OR after developed itching and hives left arm.  Received Benadryl with relief noted. Dr.Gonzalez and Dr. Leticia PennaZiegler informed. DDallasRN  . Other Other (See Comments)    [place] causes a hives, swelling, itching, and anaphylaxis.   Marland Kitchen. Penicillins Hives and Itching    Has patient had a PCN reaction causing immediate rash, facial/tongue/throat swelling, SOB or lightheadedness with hypotension: Yes Has patient had a PCN reaction causing severe rash involving mucus membranes or skin necrosis: No Has patient had a PCN reaction that required hospitalization No Has patient had a PCN reaction occurring within the last 10 years: No If all of the above answers  are "NO", then may proceed with Cephalosporin use.   Marland Kitchen. Percocet [Oxycodone-Acetaminophen] Other (See Comments)    Face flushes   Current Medications:  Current Outpatient Prescriptions  Medication Sig Dispense Refill  . acetaminophen (TYLENOL) 500 MG tablet Take 1,000 mg by mouth every 6 (six) hours as needed for moderate pain.    Marland Kitchen. amphetamine-dextroamphetamine (ADDERALL) 20 MG tablet Take 1 tablet (20 mg total) by mouth 2 (two) times daily. 60 tablet 0  . amphetamine-dextroamphetamine (ADDERALL) 20 MG tablet Take 1 tablet (20 mg total) by mouth 2 (two) times daily. 60 tablet 0  . amphetamine-dextroamphetamine (ADDERALL) 20 MG tablet Take 1 tablet (20 mg total) by mouth 2 (two) times daily. 60 tablet 0  . clonazePAM (KLONOPIN) 1 MG tablet Take 1 tablet (1 mg total) by mouth 3 (three) times daily as needed for anxiety. 90 tablet 2  . escitalopram (LEXAPRO) 20 MG tablet Take 1 tablet (20 mg total) by mouth daily. 90 tablet 2  . ibuprofen (ADVIL,MOTRIN) 200 MG tablet Take 600 mg by mouth every 6 (six) hours as needed for moderate pain.    Marland Kitchen. NUVARING 0.12-0.015 MG/24HR  vaginal ring INSERT VAGINALLY AND LEAVE IN. PLACE FOR 3 CONSECUTIVE WEEKS, THEN REMOVE FOR 1 WEEK 1 each 10   No current facility-administered medications for this visit.     Previous Psychotropic Medications:  Medication Dose   Currently on Wellbutrin XL 150 g every morning and clonazepam 0.5 mg 3 times a day                        Substance Abuse History in the last 12 months: Substance Age of 1st Use Last Use Amount Specific Type  Nicotine    smokes one half pack of cigarettes per day    Alcohol      Cannabis      Opiates      Cocaine      Methamphetamines      LSD      Ecstasy      Benzodiazepines      Caffeine      Inhalants      Others:                          Medical Consequences of Substance Abuse: none  Legal Consequences of Substance Abuse: none  Family Consequences of Substance Abuse:  none  Blackouts:  No DT's:  No Withdrawal Symptoms:  No None  Social History: Current Place of Residence: [place] [address] of Birth: [place] Family Members: Mother sister and niece. Parents are divorced and father lives in [place] with his wife and her 49 year old brother Marital Status:  Single Children:   Sons:   Daughters:  Relationships: Engaged Education:  [employer] Problems/Performance:  Religious Beliefs/Practices: none History of Abuse: none Armed forces technical officer; Armed forces training and education officer History:  None. Legal History: none Hobbies/Interests: TV  Family History:   Family History  Problem Relation Age of Onset  . Diabetes Mother   . Hypertension Mother   . Hyperlipidemia Mother   . Neuropathy Mother   . Diabetes Maternal Grandmother   . Hypertension Maternal Grandmother   . Heart disease Maternal Grandmother     CHF  . COPD Maternal Grandmother   . Cirrhosis Maternal Grandmother   . Anxiety disorder Maternal Grandmother   . Cancer Maternal Grandfather     lung  . Diabetes Paternal Grandmother   . Diabetes Paternal Grandfather   . Aneurysm Paternal Grandfather   . Anxiety disorder Paternal Grandfather   . Drug abuse Father   . ADD / ADHD Brother   . Stroke Other   . Anxiety disorder Maternal Uncle   . Anxiety disorder Paternal Uncle   . Depression Paternal Uncle   . Anxiety disorder Maternal Aunt     Mental Status Examination/Evaluation: Objective:  Appearance: Casual and Well Groomed  Eye Contact::  Good  Speech:  Clear and Coherent  Volume:  Normal  Mood:good  Affect:  bright  Thought Process:  Goal Directed  Orientation:  Full (Time, Place, and Person)  Thought Content:  Rumination  Suicidal Thoughts:  No  Homicidal Thoughts:  No  Judgement:  Good  Insight:  Good  Psychomotor Activity:  Normal  Akathisia:  No  Handed:  Right  AIMS (if indicated):    Assets:   Communication Skills Desire for Improvement Resilience Social Support Talents/Skills Vocational/Educational    Laboratory/X-Ray Psychological Evaluation(s)   Reviewed in chart     Assessment:  Axis I: Generalized Anxiety Disorder  AXIS I Generalized Anxiety Disorder  AXIS II Deferred  AXIS III Past Medical History:  Diagnosis Date  . Abscess    right buttocks/thigh  . DUB (dysfunctional uterine bleeding) 05/25/2012  . Hydradenitis   . Polycystic ovaries      AXIS IV other psychosocial or environmental problems  AXIS V 51-60 moderate symptoms   Treatment Plan/Recommendations:  Plan of Care: Medication management   Laboratory:    Psychotherapy: The patient will start therapy here   Medications: , the patient will continue  Lexapro 20 mg daily for anxiety and depression She will also continue clonazepam to 1 mg 3 times a day as needed for anxiety symptoms and Adderall 20 mg twice a day to help with ADHD symptoms   Routine PRN Medications:  Yes  Consultations:   Safety Concerns:  She denies thoughts of hurting self or others   Other: She will return in 3 months     [name], MD 1/31/201810:43 AM

## 2016-05-04 ENCOUNTER — Ambulatory Visit (INDEPENDENT_AMBULATORY_CARE_PROVIDER_SITE_OTHER): Payer: BLUE CROSS/BLUE SHIELD | Admitting: Psychiatry

## 2016-05-04 ENCOUNTER — Encounter (HOSPITAL_COMMUNITY): Payer: Self-pay | Admitting: Psychiatry

## 2016-05-04 ENCOUNTER — Encounter (INDEPENDENT_AMBULATORY_CARE_PROVIDER_SITE_OTHER): Payer: Self-pay

## 2016-05-04 VITALS — BP 120/66 | HR 73 | Ht 64.0 in | Wt 256.0 lb

## 2016-05-04 DIAGNOSIS — Z818 Family history of other mental and behavioral disorders: Secondary | ICD-10-CM

## 2016-05-04 DIAGNOSIS — Z79899 Other long term (current) drug therapy: Secondary | ICD-10-CM

## 2016-05-04 DIAGNOSIS — F411 Generalized anxiety disorder: Secondary | ICD-10-CM | POA: Diagnosis not present

## 2016-05-04 DIAGNOSIS — Z811 Family history of alcohol abuse and dependence: Secondary | ICD-10-CM

## 2016-05-04 DIAGNOSIS — F900 Attention-deficit hyperactivity disorder, predominantly inattentive type: Secondary | ICD-10-CM

## 2016-05-04 MED ORDER — CLONAZEPAM 1 MG PO TABS: 1.0000 mg | ORAL_TABLET | Freq: Three times a day (TID) | ORAL | 2 refills | Status: DC | PRN

## 2016-05-04 MED ORDER — AMPHETAMINE-DEXTROAMPHETAMINE 20 MG PO TABS: 20.0000 mg | ORAL_TABLET | Freq: Two times a day (BID) | ORAL | 0 refills | Status: DC

## 2016-05-04 MED ORDER — ESCITALOPRAM OXALATE 20 MG PO TABS: 20.0000 mg | ORAL_TABLET | Freq: Every day | ORAL | 2 refills | Status: DC

## 2016-05-04 NOTE — Progress Notes (Signed)
Patient ID: Kathleen Velasquez, female   DOB: 17-Aug-1990, 26 y.o.   MRN: [id number] Patient ID: Kathleen Velasquez, female   DOB: 11-24-1990, 27 y.o.   MRN: [id number] Patient ID: Kathleen Velasquez, female   DOB: September 16, 1990, 26 y.o.   MRN: [id number] Patient ID: Kathleen Velasquez, female   DOB: 1990/01/28, 26 y.o.   MRN: [id number] Patient ID: Kathleen Velasquez, female   DOB: 1990/09/30, 26 y.o.   MRN: [id number] Patient ID: Kathleen Velasquez, female   DOB: Jun 09, 1990, 26 y.o.   MRN: [id number] Patient ID: Kathleen Velasquez, female   DOB: 09/09/1990, 26 y.o.   MRN: [id number]  Psychiatric Assessment Adult  Patient Identification:  Kathleen Velasquez Date of Evaluation:  05/04/2016 Chief Complaint: Anxiety History of Chief Complaint:   No chief complaint on file.   Depression         Associated symptoms include decreased concentration.  Past medical history includes anxiety.   Anxiety  Symptoms include decreased concentration and nervous/anxious behavior.     this patient is a 26 year old single white female who lives with a friend  She works at  in a nursing home  The patient was referred by Dr. Dwana Melena, her primary physician for further assessment of anxiety.  The patient states that she's been having problems with anxiety and some depression since approximately March 2015. She and her fianc been together for several years and they want to have a baby together. However she's had significant problems with heavy menstrual bleeding. She's been diagnosed with polycystic ovary disease. The bleeding has gotten so bad that her recent visit with her via gynecologist recommended hysterectomy. She's been dealing with all of this since March and is been very depressed and despondent about it. Her primary physician is started her on Wellbutrin which has not helped.  The patient is also under stress regarding her 45-year-old niece. She states that her 53 year old sister, the niece's mother, does not seem at all responsible for taking care  of a child so she and her mother do all the childcare and also the financial responsibilities for the child. She feels overwhelmed by all of this plus her job. She does not sleep well and never has. She is also extremely anxious and when she has to do presentations at work she stutters and has panic attacks. She often has trouble focusing and goes from thing to thing without finishing. She denies suicidal ideation and her energy is fairly good. She was starting treatment at [facility] but has not been able to get a return phone call for follow-up visits. She's had no prior psychiatric treatment and no history of substance abuse.  The patient returns after 3 months. She states that her mother had a heart attack 2 weeks ago and she is doing everything she can to help her. She somewhat more stressed but is handling it. She is doing well in her new job and she and her aunt are thinking of buying a house across the street from her mother so they can be more available to her. She states that her mood is stable and her anxiety is under good control despite the added stress. She is staying well focused at work Review of Systems  Constitutional: Negative.   HENT: Negative.   Eyes: Negative.   Respiratory: Negative.   Cardiovascular: Negative.   Gastrointestinal: Negative.   Endocrine: Negative.   Genitourinary: Positive for pelvic pain and vaginal bleeding.  Musculoskeletal: Positive for back pain.  Allergic/Immunologic: Negative.   Neurological: Negative.   Hematological: Negative.   Psychiatric/Behavioral: Positive for decreased concentration, depression, dysphoric mood and sleep disturbance. The patient is nervous/anxious.    Physical Exam not done  Depressive Symptoms: depressed mood, anhedonia, insomnia, hopelessness, anxiety, panic attacks,  (Hypo) Manic Symptoms:   Elevated Mood:  No Irritable Mood:  Yes Grandiosity:  No Distractibility:  Yes Labiality of Mood:  No Delusions:   No Hallucinations:  No Impulsivity:  No Sexually Inappropriate Behavior:  No Financial Extravagance:  No Flight of Ideas:  No  Anxiety Symptoms: Excessive Worry:  Yes Panic Symptoms:  Yes Agoraphobia:  No Obsessive Compulsive: No  Symptoms: None, Specific Phobias:  No Social Anxiety:  Yes  Psychotic Symptoms:  Hallucinations: No None Delusions:  No Paranoia:  No   Ideas of Reference:  No  PTSD Symptoms: Ever had a traumatic exposure:  No Had a traumatic exposure in the last month:  No Re-experiencing: No None Hypervigilance:  No Hyperarousal: No None Avoidance: No None  Traumatic Brain Injury: No   Past Psychiatric History: Diagnosis: Generalized anxiety disorder   Hospitalizations: None   Outpatient Care: Has only seen the therapist a couple of times   Substance Abuse Care: none  Self-Mutilation: none  Suicidal Attempts: none  Violent Behaviors: none   Past Medical History:   Past Medical History:  Diagnosis Date  . Abscess    right buttocks/thigh  . DUB (dysfunctional uterine bleeding) 05/25/2012  . Hydradenitis   . Polycystic ovaries    History of Loss of Consciousness:  No Seizure History:  No Cardiac History:  No Allergies:   Allergies  Allergen Reactions  . Bactrim Hives and Itching  . Ciprofloxacin Hives and Itching    CRNA Discontinued preop antibiotic  IVPB  Cipro in OR after developed itching and hives left arm.  Received Benadryl with relief noted. Dr.Gonzalez and Dr. Leticia Penna informed. DDallasRN  . Other Other (See Comments)    [place] causes a hives, swelling, itching, and anaphylaxis.   Marland Kitchen Penicillins Hives and Itching    Has patient had a PCN reaction causing immediate rash, facial/tongue/throat swelling, SOB or lightheadedness with hypotension: Yes Has patient had a PCN reaction causing severe rash involving mucus membranes or skin necrosis: No Has patient had a PCN reaction that required hospitalization No Has patient had a PCN reaction  occurring within the last 10 years: No If all of the above answers are "NO", then may proceed with Cephalosporin use.   Marland Kitchen Percocet [Oxycodone-Acetaminophen] Other (See Comments)    Face flushes   Current Medications:  Current Outpatient Prescriptions  Medication Sig Dispense Refill  . acetaminophen (TYLENOL) 500 MG tablet Take 1,000 mg by mouth every 6 (six) hours as needed for moderate pain.    Marland Kitchen amphetamine-dextroamphetamine (ADDERALL) 20 MG tablet Take 1 tablet (20 mg total) by mouth 2 (two) times daily. 60 tablet 0  . clonazePAM (KLONOPIN) 1 MG tablet Take 1 tablet (1 mg total) by mouth 3 (three) times daily as needed for anxiety. 90 tablet 2  . escitalopram (LEXAPRO) 20 MG tablet Take 1 tablet (20 mg total) by mouth daily. 90 tablet 2  . ibuprofen (ADVIL,MOTRIN) 200 MG tablet Take 600 mg by mouth every 6 (six) hours as needed for moderate pain.    Marland Kitchen NUVARING 0.12-0.015 MG/24HR vaginal ring INSERT VAGINALLY AND LEAVE IN. PLACE FOR 3 CONSECUTIVE WEEKS, THEN REMOVE FOR 1 WEEK 1 each 10  . amphetamine-dextroamphetamine (ADDERALL) 20 MG tablet Take 1 tablet (20  mg total) by mouth 2 (two) times daily. 60 tablet 0  . amphetamine-dextroamphetamine (ADDERALL) 20 MG tablet Take 1 tablet (20 mg total) by mouth 2 (two) times daily. 60 tablet 0   No current facility-administered medications for this visit.     Previous Psychotropic Medications:  Medication Dose   Currently on Wellbutrin XL 150 g every morning and clonazepam 0.5 mg 3 times a day                        Substance Abuse History in the last 12 months: Substance Age of 1st Use Last Use Amount Specific Type  Nicotine    smokes one half pack of cigarettes per day    Alcohol      Cannabis      Opiates      Cocaine      Methamphetamines      LSD      Ecstasy      Benzodiazepines      Caffeine      Inhalants      Others:                          Medical Consequences of Substance Abuse: none  Legal Consequences of  Substance Abuse: none  Family Consequences of Substance Abuse: none  Blackouts:  No DT's:  No Withdrawal Symptoms:  No None  Social History: Current Place of Residence: [place] [address] of Birth: [place] Family Members: Mother sister and niece. Parents are divorced and father lives in [place] with his wife and her 90 year old brother Marital Status:  Single Children:   Sons:   Daughters:  Relationships: Engaged Education:  [employer] Problems/Performance:  Religious Beliefs/Practices: none History of Abuse: none Armed forces technical officer; Armed forces training and education officer History:  None. Legal History: none Hobbies/Interests: TV  Family History:   Family History  Problem Relation Age of Onset  . Diabetes Mother   . Hypertension Mother   . Hyperlipidemia Mother   . Neuropathy Mother   . Diabetes Maternal Grandmother   . Hypertension Maternal Grandmother   . Heart disease Maternal Grandmother     CHF  . COPD Maternal Grandmother   . Cirrhosis Maternal Grandmother   . Anxiety disorder Maternal Grandmother   . Cancer Maternal Grandfather     lung  . Diabetes Paternal Grandmother   . Diabetes Paternal Grandfather   . Aneurysm Paternal Grandfather   . Anxiety disorder Paternal Grandfather   . Drug abuse Father   . ADD / ADHD Brother   . Stroke Other   . Anxiety disorder Maternal Uncle   . Anxiety disorder Paternal Uncle   . Depression Paternal Uncle   . Anxiety disorder Maternal Aunt     Mental Status Examination/Evaluation: Objective:  Appearance: Casual and Well Groomed  Eye Contact::  Good  Speech:  Clear and Coherent  Volume:  Normal  Mood:good,Somewhat anxious   Affect:  bright  Thought Process:  Goal Directed  Orientation:  Full (Time, Place, and Person)  Thought Content:  Rumination  Suicidal Thoughts:  No  Homicidal Thoughts:  No  Judgement:  Good  Insight:  Good  Psychomotor Activity:   Normal  Akathisia:  No  Handed:  Right  AIMS (if indicated):    Assets:  Communication Skills Desire for Improvement Resilience Social Support Talents/Skills Vocational/Educational    Laboratory/X-Ray Psychological Evaluation(s)   Reviewed in chart  Assessment:  Axis I: Generalized Anxiety Disorder  AXIS I Generalized Anxiety Disorder  AXIS II Deferred  AXIS III Past Medical History:  Diagnosis Date  . Abscess    right buttocks/thigh  . DUB (dysfunctional uterine bleeding) 05/25/2012  . Hydradenitis   . Polycystic ovaries      AXIS IV other psychosocial or environmental problems  AXIS V 51-60 moderate symptoms   Treatment Plan/Recommendations:  Plan of Care: Medication management   Laboratory:    Psychotherapy: The patient will start therapy here   Medications: , the patient will continue  Lexapro 20 mg daily for anxiety and depression She will also continue clonazepam to 1 mg 3 times a day as needed for anxiety symptoms and Adderall 20 mg twice a day to help with ADHD symptoms   Routine PRN Medications:  Yes  Consultations:   Safety Concerns:  She denies thoughts of hurting self or others   Other: She will return in 3 months     [name], MD 4/23/201811:06 AM

## 2016-07-24 ENCOUNTER — Telehealth (HOSPITAL_COMMUNITY): Payer: Self-pay | Admitting: *Deleted

## 2016-07-24 NOTE — Telephone Encounter (Signed)
left voice message, provider out of office 07/27/16.

## 2016-07-27 ENCOUNTER — Ambulatory Visit (HOSPITAL_COMMUNITY): Payer: Self-pay | Admitting: Psychiatry

## 2016-08-11 ENCOUNTER — Encounter (HOSPITAL_COMMUNITY): Payer: Self-pay | Admitting: Psychiatry

## 2016-08-11 ENCOUNTER — Encounter (INDEPENDENT_AMBULATORY_CARE_PROVIDER_SITE_OTHER): Payer: Self-pay

## 2016-08-11 ENCOUNTER — Ambulatory Visit (INDEPENDENT_AMBULATORY_CARE_PROVIDER_SITE_OTHER): Payer: Self-pay | Admitting: Psychiatry

## 2016-08-11 VITALS — BP 118/86 | HR 101 | Ht 64.0 in | Wt 255.0 lb

## 2016-08-11 DIAGNOSIS — F900 Attention-deficit hyperactivity disorder, predominantly inattentive type: Secondary | ICD-10-CM

## 2016-08-11 DIAGNOSIS — F411 Generalized anxiety disorder: Secondary | ICD-10-CM

## 2016-08-11 DIAGNOSIS — Z818 Family history of other mental and behavioral disorders: Secondary | ICD-10-CM

## 2016-08-11 MED ORDER — AMPHETAMINE-DEXTROAMPHETAMINE 20 MG PO TABS: 20.0000 mg | ORAL_TABLET | Freq: Two times a day (BID) | ORAL | 0 refills | Status: DC

## 2016-08-11 MED ORDER — ESCITALOPRAM OXALATE 20 MG PO TABS: 20.0000 mg | ORAL_TABLET | Freq: Every day | ORAL | 2 refills | Status: DC

## 2016-08-11 MED ORDER — CLONAZEPAM 1 MG PO TABS: 1.0000 mg | ORAL_TABLET | Freq: Three times a day (TID) | ORAL | 2 refills | Status: DC | PRN

## 2016-08-11 NOTE — Progress Notes (Signed)
Patient ID: Kathleen HawthorneBrittany R Velasquez, female   DOB: 03/07/1990, 26 y.o.   MRN: [id number] Patient ID: Kathleen HawthorneBrittany R Velasquez, female   DOB: 09/18/1990, 26 y.o.   MRN: [id number] Patient ID: Kathleen HawthorneBrittany R Velasquez, female   DOB: 08/03/1990, 26 y.o.   MRN: [id number] Patient ID: Kathleen HawthorneBrittany R Velasquez, female   DOB: 11/24/1990, 26 y.o.   MRN: [id number] Patient ID: Kathleen HawthorneBrittany R Velasquez, female   DOB: 06/13/1990, 26 y.o.   MRN: [id number] Patient ID: Kathleen HawthorneBrittany R Velasquez, female   DOB: 05/15/1990, 26 y.o.   MRN: [id number] Patient ID: Kathleen HawthorneBrittany R Velasquez, female   DOB: 09/27/1990, 26 y.o.   MRN: [id number]  Psychiatric Assessment Adult  Patient Identification:  Kathleen Velasquez Date of Evaluation:  08/11/2016 Chief Complaint: Anxiety History of Chief Complaint:   Chief Complaint  Patient presents with  . Depression  . Anxiety  . Follow-up    Depression         Associated symptoms include decreased concentration.  Past medical history includes anxiety.   Anxiety  Symptoms include decreased concentration and nervous/anxious behavior.     this patient is a 89 year old single white female who lives with a friend  She works at  in a nursing home  The patient was referred by Dr. Dwana MelenaZack Hall, her primary physician for further assessment of anxiety.  The patient states that she's been having problems with anxiety and some depression since approximately March 2015. She and her fianc been together for several years and they want to have a baby together. However she's had significant problems with heavy menstrual bleeding. She's been diagnosed with polycystic ovary disease. The bleeding has gotten so bad that her recent visit with her via gynecologist recommended hysterectomy. She's been dealing with all of this since March and is been very depressed and despondent about it. Her primary physician is started her on Wellbutrin which has not helped.  The patient is also under stress regarding her 27-year-old niece. She states that her 27 year old sister, the niece's  mother, does not seem at all responsible for taking care of a child so she and her mother do all the childcare and also the financial responsibilities for the child. She feels overwhelmed by all of this plus her job. She does not sleep well and never has. She is also extremely anxious and when she has to do presentations at work she stutters and has panic attacks. She often has trouble focusing and goes from thing to thing without finishing. She denies suicidal ideation and her energy is fairly good. She was starting treatment at [facility] but has not been able to get a return phone call for follow-up visits. She's had no prior psychiatric treatment and no history of substance abuse.  The patient returns after 3 months. She states that she and her aunt are just about to close on a house across the street from her mother's house. She is excited about it but has been through a lot of stress lately. Her 542 year old roommate has ovarian cancer and has been going through chemotherapy. The roommate's brother also recently overdosed on heroin. She's been trying to be supportive for her roommate and is also working full time and in the process of buying a house. Her mood has been stable and her medications are still helping her anxiety and focus Review of Systems  Constitutional: Negative.   HENT: Negative.   Eyes: Negative.   Respiratory: Negative.   Cardiovascular: Negative.   Gastrointestinal: Negative.   Endocrine:  Negative.   Genitourinary: Positive for pelvic pain and vaginal bleeding.  Musculoskeletal: Positive for back pain.  Allergic/Immunologic: Negative.   Neurological: Negative.   Hematological: Negative.   Psychiatric/Behavioral: Positive for decreased concentration, depression, dysphoric mood and sleep disturbance. The patient is nervous/anxious.    Physical Exam not done  Depressive Symptoms: depressed mood, anhedonia, insomnia, hopelessness, anxiety, panic  attacks,  (Hypo) Manic Symptoms:   Elevated Mood:  No Irritable Mood:  Yes Grandiosity:  No Distractibility:  Yes Labiality of Mood:  No Delusions:  No Hallucinations:  No Impulsivity:  No Sexually Inappropriate Behavior:  No Financial Extravagance:  No Flight of Ideas:  No  Anxiety Symptoms: Excessive Worry:  Yes Panic Symptoms:  Yes Agoraphobia:  No Obsessive Compulsive: No  Symptoms: None, Specific Phobias:  No Social Anxiety:  Yes  Psychotic Symptoms:  Hallucinations: No None Delusions:  No Paranoia:  No   Ideas of Reference:  No  PTSD Symptoms: Ever had a traumatic exposure:  No Had a traumatic exposure in the last month:  No Re-experiencing: No None Hypervigilance:  No Hyperarousal: No None Avoidance: No None  Traumatic Brain Injury: No   Past Psychiatric History: Diagnosis: Generalized anxiety disorder   Hospitalizations: None   Outpatient Care: Has only seen the therapist a couple of times   Substance Abuse Care: none  Self-Mutilation: none  Suicidal Attempts: none  Violent Behaviors: none   Past Medical History:   Past Medical History:  Diagnosis Date  . Abscess    right buttocks/thigh  . DUB (dysfunctional uterine bleeding) 05/25/2012  . Hydradenitis   . Polycystic ovaries    History of Loss of Consciousness:  No Seizure History:  No Cardiac History:  No Allergies:   Allergies  Allergen Reactions  . Bactrim Hives and Itching  . Ciprofloxacin Hives and Itching    [facility] Discontinued preop antibiotic  IVPB  Cipro in OR after developed itching and hives left arm.  Received Benadryl with relief noted. Dr.Gonzalez and Dr. Leticia PennaZiegler informed. DDallasRN  . Other Other (See Comments)    [place] causes a hives, swelling, itching, and anaphylaxis.   Marland Kitchen. Penicillins Hives and Itching    Has patient had a PCN reaction causing immediate rash, facial/tongue/throat swelling, SOB or lightheadedness with hypotension: Yes Has patient had a PCN reaction causing  severe rash involving mucus membranes or skin necrosis: No Has patient had a PCN reaction that required hospitalization No Has patient had a PCN reaction occurring within the last 10 years: No If all of the above answers are "NO", then may proceed with Cephalosporin use.   Marland Kitchen. Percocet [Oxycodone-Acetaminophen] Other (See Comments)    Face flushes   Current Medications:  Current Outpatient Prescriptions  Medication Sig Dispense Refill  . acetaminophen (TYLENOL) 500 MG tablet Take 1,000 mg by mouth every 6 (six) hours as needed for moderate pain.    Marland Kitchen. amphetamine-dextroamphetamine (ADDERALL) 20 MG tablet Take 1 tablet (20 mg total) by mouth 2 (two) times daily. 60 tablet 0  . clonazePAM (KLONOPIN) 1 MG tablet Take 1 tablet (1 mg total) by mouth 3 (three) times daily as needed for anxiety. 90 tablet 2  . escitalopram (LEXAPRO) 20 MG tablet Take 1 tablet (20 mg total) by mouth daily. 90 tablet 2  . ibuprofen (ADVIL,MOTRIN) 200 MG tablet Take 600 mg by mouth every 6 (six) hours as needed for moderate pain.    Marland Kitchen. NUVARING 0.12-0.015 MG/24HR vaginal ring INSERT VAGINALLY AND LEAVE IN. PLACE FOR 3 CONSECUTIVE WEEKS, THEN  REMOVE FOR 1 WEEK 1 each 10  . amphetamine-dextroamphetamine (ADDERALL) 20 MG tablet Take 1 tablet (20 mg total) by mouth 2 (two) times daily. 60 tablet 0  . amphetamine-dextroamphetamine (ADDERALL) 20 MG tablet Take 1 tablet (20 mg total) by mouth 2 (two) times daily. 60 tablet 0   No current facility-administered medications for this visit.     Previous Psychotropic Medications:  Medication Dose   Currently on Wellbutrin XL 150 g every morning and clonazepam 0.5 mg 3 times a day                        Substance Abuse History in the last 12 months: Substance Age of 1st Use Last Use Amount Specific Type  Nicotine    smokes one half pack of cigarettes per day    Alcohol      Cannabis      Opiates      Cocaine      Methamphetamines      LSD      Ecstasy       Benzodiazepines      Caffeine      Inhalants      Others:                          Medical Consequences of Substance Abuse: none  Legal Consequences of Substance Abuse: none  Family Consequences of Substance Abuse: none  Blackouts:  No DT's:  No Withdrawal Symptoms:  No None  Social History: Current Place of Residence: [place] [address] of Birth: [place] Family Members: Mother sister and niece. Parents are divorced and father lives in [place] with his wife and her 24 year old brother Marital Status:  Single Children:   Sons:   Daughters:  Relationships: Engaged Education:  [employer] Problems/Performance:  Religious Beliefs/Practices: none History of Abuse: none Armed forces technical officer; Armed forces training and education officer History:  None. Legal History: none Hobbies/Interests: TV  Family History:   Family History  Problem Relation Age of Onset  . Diabetes Mother   . Hypertension Mother   . Hyperlipidemia Mother   . Neuropathy Mother   . Diabetes Maternal Grandmother   . Hypertension Maternal Grandmother   . Heart disease Maternal Grandmother        CHF  . COPD Maternal Grandmother   . Cirrhosis Maternal Grandmother   . Anxiety disorder Maternal Grandmother   . Cancer Maternal Grandfather        lung  . Diabetes Paternal Grandmother   . Diabetes Paternal Grandfather   . Aneurysm Paternal Grandfather   . Anxiety disorder Paternal Grandfather   . Drug abuse Father   . ADD / ADHD Brother   . Stroke Other   . Anxiety disorder Maternal Uncle   . Anxiety disorder Paternal Uncle   . Depression Paternal Uncle   . Anxiety disorder Maternal Aunt     Mental Status Examination/Evaluation: Objective:  Appearance: Casual and Well Groomed  Eye Contact::  Good  Speech:  Clear and Coherent  Volume:  Normal  Mood:good,   Affect:  bright  Thought Process:  Goal Directed  Orientation:  Full (Time, Place, and  Person)  Thought Content:  Rumination  Suicidal Thoughts:  No  Homicidal Thoughts:  No  Judgement:  Good  Insight:  Good  Psychomotor Activity:  Normal  Akathisia:  No  Handed:  Right  AIMS (if indicated):    Assets:  Communication Skills Desire for Improvement Resilience Social Support Talents/Skills Vocational/Educational    Laboratory/X-Ray Psychological Evaluation(s)   Reviewed in chart     Assessment:  Axis I: Generalized Anxiety Disorder  AXIS I Generalized Anxiety Disorder  AXIS II Deferred  AXIS III Past Medical History:  Diagnosis Date  . Abscess    right buttocks/thigh  . DUB (dysfunctional uterine bleeding) 05/25/2012  . Hydradenitis   . Polycystic ovaries      AXIS IV other psychosocial or environmental problems  AXIS V 51-60 moderate symptoms   Treatment Plan/Recommendations:  Plan of Care: Medication management   Laboratory:    Psychotherapy:    Medications: , the patient will continue  Lexapro 20 mg daily for anxiety and depression She will also continue clonazepam to 1 mg 3 times a day as needed for anxiety symptoms and Adderall 20 mg twice a day to help with ADHD symptoms   Routine PRN Medications:  Yes  Consultations:   Safety Concerns:  She denies thoughts of hurting self or others   Other: She will return in 3 months     [name], MD 7/31/20183:26 PM

## 2016-10-14 ENCOUNTER — Emergency Department (HOSPITAL_COMMUNITY): Payer: Self-pay

## 2016-10-14 ENCOUNTER — Emergency Department (HOSPITAL_COMMUNITY)
Admission: EM | Admit: 2016-10-14 | Discharge: 2016-10-14 | Disposition: A | Discharge status: Home / Self Care | Payer: Self-pay | Attending: Emergency Medicine | Admitting: Emergency Medicine

## 2016-10-14 ENCOUNTER — Encounter (HOSPITAL_COMMUNITY): Payer: Self-pay | Admitting: Emergency Medicine

## 2016-10-14 DIAGNOSIS — J069 Acute upper respiratory infection, unspecified: Secondary | ICD-10-CM | POA: Insufficient documentation

## 2016-10-14 DIAGNOSIS — F17210 Nicotine dependence, cigarettes, uncomplicated: Secondary | ICD-10-CM | POA: Insufficient documentation

## 2016-10-14 DIAGNOSIS — Z79899 Other long term (current) drug therapy: Secondary | ICD-10-CM | POA: Insufficient documentation

## 2016-10-14 DIAGNOSIS — B9789 Other viral agents as the cause of diseases classified elsewhere: Secondary | ICD-10-CM | POA: Insufficient documentation

## 2016-10-14 MED ORDER — PROMETHAZINE-DM 6.25-15 MG/5ML PO SYRP: 5.0000 mL | ORAL_SOLUTION | Freq: Four times a day (QID) | ORAL | 0 refills | Status: DC | PRN

## 2016-10-14 MED ORDER — BENZONATATE 100 MG PO CAPS: 100.0000 mg | ORAL_CAPSULE | Freq: Three times a day (TID) | ORAL | 0 refills | Status: DC

## 2016-10-14 NOTE — ED Provider Notes (Signed)
[facility]PT Provider Note   CSN: [id number] Arrival date & time: 10/14/16  1249     History   Chief Complaint Chief Complaint  Patient presents with  . Cough    HPI Kathleen Velasquez is a 26 y.o. female.  HPI   26 year old female presenting with complaints of cold symptoms.for the past 4 days patient has had persistent nonproductive cough, nasal congestion, fever, chills, joint pain, decreased appetite and mild nausea. Symptoms not improving despite using over-the-counter medication such as Mucinex, and Robitussin. She works at a nursing facility. She did not have a flu shot but did had a pneumonia shot. She is a smoker and smoked about half a pack a day. She denies vomiting or diarrhea, or having shortness of breath. No history of COPD or asthma.  Past Medical History:  Diagnosis Date  . Abscess    right buttocks/thigh  . DUB (dysfunctional uterine bleeding) 05/25/2012  . Hydradenitis   . Polycystic ovaries     Patient Active Problem List   Diagnosis Date Noted  . Anovulatory (dysfunctional uterine) bleeding 06/07/2012  . DUB (dysfunctional uterine bleeding) 05/25/2012  . FOOT PAIN, CHRONIC 05/09/2007    Past Surgical History:  Procedure Laterality Date  . ADENOIDECTOMY    . CHOLECYSTECTOMY  01/27/2012   Procedure: LAPAROSCOPIC CHOLECYSTECTOMY;  Surgeon: Fabio Bering, MD;  Location: [facility];  Service: General;  Laterality: N/A;  . HERNIA REPAIR    . HYDRADENITIS EXCISION  10/02/2011   Procedure: EXCISION HYDRADENITIS AXILLA;  Surgeon: Fabio Bering, MD;  Location: [facility];  Service: General;  Laterality: Right;  Right Axillary Dissection  . HYDRADENITIS EXCISION Left 10/19/2013   Procedure: INCISION OF HIDRADENITIS SUPPURATIVA LEFT AXILLA;  Surgeon: Marlane Hatcher, MD;  Location: [facility];  Service: General;  Laterality: Left;  . TONSILLECTOMY      OB History    Gravida Para Term Preterm AB Living   0         0   SAB TAB Ectopic Multiple Live Births                Home Medications    Prior to Admission medications   Medication Sig Start Date End Date Taking? Authorizing Provider  acetaminophen (TYLENOL) 500 MG tablet Take 1,000 mg by mouth every 6 (six) hours as needed for moderate pain.    [provider]  amphetamine-dextroamphetamine (ADDERALL) 20 MG tablet Take 1 tablet (20 mg total) by mouth 2 (two) times daily. 08/11/16 08/11/17  Myrlene Broker, MD  amphetamine-dextroamphetamine (ADDERALL) 20 MG tablet Take 1 tablet (20 mg total) by mouth 2 (two) times daily. 08/11/16 08/11/17  Myrlene Broker, MD  amphetamine-dextroamphetamine (ADDERALL) 20 MG tablet Take 1 tablet (20 mg total) by mouth 2 (two) times daily. 08/11/16 08/11/17  Myrlene Broker, MD  clonazePAM (KLONOPIN) 1 MG tablet Take 1 tablet (1 mg total) by mouth 3 (three) times daily as needed for anxiety. 08/11/16 08/11/17  Myrlene Broker, MD  escitalopram (LEXAPRO) 20 MG tablet Take 1 tablet (20 mg total) by mouth daily. 08/11/16 08/11/17  Myrlene Broker, MD  ibuprofen (ADVIL,MOTRIN) 200 MG tablet Take 600 mg by mouth every 6 (six) hours as needed for moderate pain.    [provider]  NUVARING 0.12-0.015 MG/24HR vaginal ring INSERT VAGINALLY AND LEAVE IN. PLACE FOR 3 CONSECUTIVE WEEKS, THEN REMOVE FOR 1 WEEK 11/28/15   Constant, Peggy, MD    Family History Family History  Problem Relation Age  of Onset  . Diabetes Mother   . Hypertension Mother   . Hyperlipidemia Mother   . Neuropathy Mother   . Diabetes Maternal Grandmother   . Hypertension Maternal Grandmother   . Heart disease Maternal Grandmother        CHF  . COPD Maternal Grandmother   . Cirrhosis Maternal Grandmother   . Anxiety disorder Maternal Grandmother   . Cancer Maternal Grandfather        lung  . Diabetes Paternal Grandmother   . Diabetes Paternal Grandfather   . Aneurysm Paternal Grandfather   . Anxiety disorder Paternal Grandfather   . Drug abuse Father   . ADD / ADHD Brother   .  Stroke Other   . Anxiety disorder Maternal Uncle   . Anxiety disorder Paternal Uncle   . Depression Paternal Uncle   . Anxiety disorder Maternal Aunt     Social History Social History  Substance Use Topics  . Smoking status: Current Every Day Smoker    Packs/day: 0.50    Years: 7.00    Types: Cigarettes  . Smokeless tobacco: Never Used  . Alcohol use No     Comment: social use . 01-19-14 per pt no     Allergies   Bactrim; Ciprofloxacin; Other; Penicillins; and Percocet [oxycodone-acetaminophen]   Review of Systems Review of Systems  All other systems reviewed and are negative.    Physical Exam Updated Vital Signs BP (!) 148/76 (BP Location: Right Arm)   Pulse 90   Temp 98.4 F (36.9 C) (Oral)   Resp 18   Ht  (1.626 m)   Wt 98.9 kg (218 lb)   LMP 09/27/2016   SpO2 99%   BMI 37.42 kg/m   Physical Exam  Constitutional: She is oriented to person, place, and time. She appears well-developed and well-nourished. No distress.  HENT:  Head: Atraumatic.  Right Ear: External ear normal.  Left Ear: External ear normal.  Mouth/Throat: Oropharynx is clear and moist.  Eyes: Conjunctivae are normal.  Neck: Neck supple.  No nuchal rigidity  Cardiovascular: Normal rate and regular rhythm.   Pulmonary/Chest: Effort normal.  Abdominal: Soft.  Neurological: She is alert and oriented to person, place, and time.  Skin: No rash noted.  Psychiatric: She has a normal mood and affect.  Nursing note and vitals reviewed.    ED Treatments / Results  Labs (all labs ordered are listed, but only abnormal results are displayed) Labs Reviewed - No data to display  EKG  EKG Interpretation None       Radiology Dg Chest 2 View  Result Date: 10/14/2016 CLINICAL DATA:  Cough with mid upper back and mid chest discomfort for the past week. Current smoker. EXAM: CHEST  2 VIEW COMPARISON:  Chest x-ray report of Jun 07, 2015 FINDINGS: The lungs are adequately inflated. There is  no focal infiltrate. The heart and pulmonary vascularity are normal. The mediastinum is normal in width. There is no pleural effusion. The bony thorax is unremarkable. IMPRESSION: There is no active cardiopulmonary disease. Electronically Signed   By: David  [place] M.D.   On: 10/14/2016 14:52    Procedures Procedures (including critical care time)  Medications Ordered in ED Medications - No data to display   Initial Impression / Assessment and Plan / ED Course  I have reviewed the triage vital signs and the nursing notes.  Pertinent labs & imaging results that were available during my care of the patient were reviewed by me and  considered in my medical decision making (see chart for details).     BP (!) 148/76 (BP Location: Right Arm)   Pulse 90   Temp 98.4 F (36.9 C) (Oral)   Resp 18   Ht  (1.626 m)   Wt 98.9 kg (218 lb)   LMP 09/27/2016   SpO2 99%   BMI 37.42 kg/m    Final Clinical Impressions(s) / ED Diagnoses   Final diagnoses:  Viral URI with cough    New Prescriptions New Prescriptions   No medications on file   Pt symptoms consistent with URI. CXR negative for acute infiltrate. Pt will be discharged with symptomatic treatment.  Discussed return precautions.  Pt is hemodynamically stable & in NAD prior to discharge.    Fayrene Helper, PA-C 10/14/16 1523    Donnetta Hutching, MD 10/17/16 [phone]

## 2016-10-14 NOTE — ED Triage Notes (Signed)
PT c/o painful cough non-productive and sinus pressure/congestion unrelieved by OTC cough medications and mucinex x3 days. 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

## 2016-11-11 ENCOUNTER — Ambulatory Visit (HOSPITAL_COMMUNITY): Payer: Self-pay | Admitting: Psychiatry

## 2016-11-19 ENCOUNTER — Ambulatory Visit (HOSPITAL_COMMUNITY): Payer: Self-pay | Admitting: Psychiatry

## 2016-11-19 ENCOUNTER — Encounter (HOSPITAL_COMMUNITY): Payer: Self-pay | Admitting: Psychiatry

## 2016-11-19 VITALS — BP 127/81 | HR 88 | Ht 64.0 in | Wt 251.0 lb

## 2016-11-19 DIAGNOSIS — F17210 Nicotine dependence, cigarettes, uncomplicated: Secondary | ICD-10-CM

## 2016-11-19 DIAGNOSIS — F332 Major depressive disorder, recurrent severe without psychotic features: Secondary | ICD-10-CM

## 2016-11-19 DIAGNOSIS — Z818 Family history of other mental and behavioral disorders: Secondary | ICD-10-CM

## 2016-11-19 DIAGNOSIS — F411 Generalized anxiety disorder: Secondary | ICD-10-CM

## 2016-11-19 DIAGNOSIS — Z813 Family history of other psychoactive substance abuse and dependence: Secondary | ICD-10-CM

## 2016-11-19 DIAGNOSIS — F900 Attention-deficit hyperactivity disorder, predominantly inattentive type: Secondary | ICD-10-CM

## 2016-11-19 DIAGNOSIS — Z6379 Other stressful life events affecting family and household: Secondary | ICD-10-CM

## 2016-11-19 MED ORDER — AMPHETAMINE-DEXTROAMPHETAMINE 20 MG PO TABS: 20.0000 mg | ORAL_TABLET | Freq: Two times a day (BID) | ORAL | 0 refills | Status: DC

## 2016-11-19 MED ORDER — CLONAZEPAM 1 MG PO TABS: 1.0000 mg | ORAL_TABLET | Freq: Three times a day (TID) | ORAL | 2 refills | Status: DC | PRN

## 2016-11-19 MED ORDER — ESCITALOPRAM OXALATE 20 MG PO TABS: 20.0000 mg | ORAL_TABLET | Freq: Every day | ORAL | 2 refills | Status: DC

## 2016-11-19 NOTE — Progress Notes (Signed)
BH MD/PA/NP OP Progress Note  11/19/2016 2:14 PM Kathleen Velasquez  MRN:  [id number]  Chief Complaint:  Chief Complaint    Depression; Anxiety; Follow-up     HPI:  this patient is a 26 year old single white female who lives with a friend  She works at  in a nursing home  The patient was referred by Dr. Dwana MelenaZack Hall, her primary physician for further assessment of anxiety.  The patient states that she's been having problems with anxiety and some depression since approximately March 2015. She and her fianc been together for several years and they want to have a baby together. However she's had significant problems with heavy menstrual bleeding. She's been diagnosed with polycystic ovary disease. The bleeding has gotten so bad that her recent visit with her via gynecologist recommended hysterectomy. She's been dealing with all of this since March and is been very depressed and despondent about it. Her primary physician is started her on Wellbutrin which has not helped.  The patient is also under stress regarding her 27-year-old niece. She states that her 27 year old sister, the niece's mother, does not seem at all responsible for taking care of a child so she and her mother do all the childcare and also the financial responsibilities for the child. She feels overwhelmed by all of this plus her job. She does not sleep well and never has. She is also extremely anxious and when she has to do presentations at work she stutters and has panic attacks. She often has trouble focusing and goes from thing to thing without finishing. She denies suicidal ideation and her energy is fairly good. She was starting treatment at resolution counseling but has not been able to get a return phone call for follow-up visits. She's had no prior psychiatric treatment and no history of substance abuse.  Patient returns after 3 months.  For the most part she has been doing well.  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   She is moved across the street from her mother because the mother had a heart attack last April and she is helping her out and checking on her.  She states that in general her energy and mood have been good and she is sleeping well.  She is staying well focused.  The clonazepam is helping her anxiety and panic attacks.  She has been handling the stress of caring for her mother and also dealing with her roommate who recently had cancer treatment. Visit Diagnosis:    ICD-10-CM   1. Generalized anxiety disorder F41.1   2. Attention deficit hyperactivity disorder (ADHD), predominantly inattentive type F90.0     Past Psychiatric History: none  Past Medical History:  Past Medical History:  Diagnosis Date  . Abscess    right buttocks/thigh  . DUB (dysfunctional uterine bleeding) 05/25/2012  . Hydradenitis   . Polycystic ovaries     Past Surgical History:  Procedure Laterality Date  . ADENOIDECTOMY    . HERNIA REPAIR    . TONSILLECTOMY      Family Psychiatric History: See below  Family History:  Family History  Problem Relation Age of Onset  . Diabetes Mother   . Hypertension Mother   . Hyperlipidemia Mother   . Neuropathy Mother   . Diabetes Maternal Grandmother   . Hypertension Maternal Grandmother   . Heart disease Maternal Grandmother        CHF  . COPD Maternal Grandmother   . Cirrhosis Maternal Grandmother   .  Anxiety disorder Maternal Grandmother   . Cancer Maternal Grandfather        lung  . Diabetes Paternal Grandmother   . Diabetes Paternal Grandfather   . Aneurysm Paternal Grandfather   . Anxiety disorder Paternal Grandfather   . Drug abuse Father   . ADD / ADHD Brother   . Stroke Other   . Anxiety disorder Maternal Uncle   . Anxiety disorder Paternal Uncle   . Depression Paternal Uncle   . Anxiety disorder Maternal Aunt     Social History:  Social History   Socioeconomic History  . Marital status: Single    Spouse name: None  . Number of children: None   . Years of education: None  . Highest education level: None  Social Needs  . Financial resource strain: None  . Food insecurity - worry: None  . Food insecurity - inability: None  . Transportation needs - medical: None  . Transportation needs - non-medical: None  Occupational History  . None  Tobacco Use  . Smoking status: Current Every Day Smoker    Packs/day: 0.50    Years: 7.00    Pack years: 3.50    Types: Cigarettes  . Smokeless tobacco: Never Used  Substance and Sexual Activity  . Alcohol use: No    Comment: social use . 01-19-14 per pt no  . Drug use: No  . Sexual activity: Yes    Birth control/protection: None  Other Topics Concern  . None  Social History Narrative  . None    Allergies:  Allergies  Allergen Reactions  . Bactrim Hives and Itching  . Ciprofloxacin Hives and Itching    CRNA Discontinued preop antibiotic  IVPB  Cipro in OR after developed itching and hives left arm.  Received Benadryl with relief noted. Dr.Gonzalez and Dr. Leticia Penna informed. DDallasRN  . Other Other (See Comments)    [place] causes a hives, swelling, itching, and anaphylaxis.   Marland Kitchen Penicillins Hives and Itching    Has patient had a PCN reaction causing immediate rash, facial/tongue/throat swelling, SOB or lightheadedness with hypotension: Yes Has patient had a PCN reaction causing severe rash involving mucus membranes or skin necrosis: No Has patient had a PCN reaction that required hospitalization No Has patient had a PCN reaction occurring within the last 10 years: No If all of the above answers are "NO", then may proceed with Cephalosporin use.   Marland Kitchen Percocet [Oxycodone-Acetaminophen] Other (See Comments)    Face flushes    Metabolic Disorder Labs: Lab Results  Component Value Date   HGBA1C 5.7 (H) 05/25/2012   MPG 117 (H) 05/25/2012   No results found for: PROLACTIN No results found for: CHOL, TRIG, HDL, CHOLHDL, VLDL, LDLCALC Lab Results  Component Value Date   TSH 2.515  05/25/2012    Therapeutic Level Labs: No results found for: LITHIUM No results found for: VALPROATE No components found for:  CBMZ  Current Medications: Current Outpatient Medications  Medication Sig Dispense Refill  . acetaminophen (TYLENOL) 500 MG tablet Take 1,000 mg by mouth every 6 (six) hours as needed for moderate pain.    Marland Kitchen amphetamine-dextroamphetamine (ADDERALL) 20 MG tablet Take 1 tablet (20 mg total) 2 (two) times daily by mouth. 60 tablet 0  . benzonatate (TESSALON) 100 MG capsule Take 1 capsule (100 mg total) by mouth every 8 (eight) hours. 21 capsule 0  . clonazePAM (KLONOPIN) 1 MG tablet Take 1 tablet (1 mg total) 3 (three) times daily as needed by mouth for  anxiety. 90 tablet 2  . escitalopram (LEXAPRO) 20 MG tablet Take 1 tablet (20 mg total) daily by mouth. 90 tablet 2  . ibuprofen (ADVIL,MOTRIN) 200 MG tablet Take 600 mg by mouth every 6 (six) hours as needed for moderate pain.    Marland Kitchen. NUVARING 0.12-0.015 MG/24HR vaginal ring INSERT VAGINALLY AND LEAVE IN. PLACE FOR 3 CONSECUTIVE WEEKS, THEN REMOVE FOR 1 WEEK 1 each 10  . amphetamine-dextroamphetamine (ADDERALL) 20 MG tablet Take 1 tablet (20 mg total) 2 (two) times daily by mouth. 60 tablet 0  . amphetamine-dextroamphetamine (ADDERALL) 20 MG tablet Take 1 tablet (20 mg total) 2 (two) times daily by mouth. 60 tablet 0   No current facility-administered medications for this visit.      Musculoskeletal: Strength & Muscle Tone: within normal limits Gait & Station: normal Patient leans: N/A  Psychiatric Specialty Exam: Review of Systems  All other systems reviewed and are negative.   Blood pressure 127/81, pulse 88, height 5\' 4"  (1.626 m), weight 251 lb (113.9 kg).Body mass index is 43.08 kg/m.  General Appearance: Casual and Fairly Groomed  Eye Contact:  Good  Speech:  Clear and Coherent  Volume:  Normal  Mood:  Euthymic  Affect:  Appropriate  Thought Process:  Goal Directed  Orientation:  Full (Time, Place,  and Person)  Thought Content: WDL   Suicidal Thoughts:  No  Homicidal Thoughts:  No  Memory:  Immediate;   Good Recent;   Good Remote;   Good  Judgement:  Good  Insight:  Good  Psychomotor Activity:  Normal  Concentration:  Concentration: Good and Attention Span: Good  Recall:  Good  Fund of Knowledge: Good  Language: Good  Akathisia:  No  Handed:  Right  AIMS (if indicated): not done  Assets:  Communication Skills Desire for Improvement Physical Health Resilience Social Support Talents/Skills  ADL's:  Intact  Cognition: WNL  Sleep:  Good   Screenings:   Assessment and Plan: Patient is a 26 year old female with a history of depression anxiety and ADD.  She is doing better on her current regimen.  Lexapro 20 mg daily continues to help her depression, clonazepam 1 mg 3 times daily as needed for anxiety has diminished her panic attacks.  Adderall 20 mg twice daily is working well for her ADHD symptoms.  She will continue these medications and return to see me in 3 months   Diannia RuderOSS, Aspynn Clover, MD 11/19/2016, 2:14 PM

## 2017-02-11 DIAGNOSIS — E6609 Other obesity due to excess calories: Secondary | ICD-10-CM | POA: Diagnosis not present

## 2017-02-11 DIAGNOSIS — R030 Elevated blood-pressure reading, without diagnosis of hypertension: Secondary | ICD-10-CM | POA: Diagnosis not present

## 2017-02-11 DIAGNOSIS — R5383 Other fatigue: Secondary | ICD-10-CM | POA: Diagnosis not present

## 2017-02-15 DIAGNOSIS — Z0001 Encounter for general adult medical examination with abnormal findings: Secondary | ICD-10-CM | POA: Diagnosis not present

## 2017-02-19 ENCOUNTER — Ambulatory Visit (HOSPITAL_COMMUNITY): Payer: Self-pay | Admitting: Psychiatry

## 2017-02-19 ENCOUNTER — Ambulatory Visit (HOSPITAL_COMMUNITY): Payer: BLUE CROSS/BLUE SHIELD | Admitting: Psychiatry

## 2017-02-19 ENCOUNTER — Encounter (HOSPITAL_COMMUNITY): Payer: Self-pay | Admitting: Psychiatry

## 2017-02-19 VITALS — BP 125/84 | HR 81 | Ht 64.0 in | Wt 255.0 lb

## 2017-02-19 DIAGNOSIS — F17210 Nicotine dependence, cigarettes, uncomplicated: Secondary | ICD-10-CM | POA: Diagnosis not present

## 2017-02-19 DIAGNOSIS — F411 Generalized anxiety disorder: Secondary | ICD-10-CM | POA: Diagnosis not present

## 2017-02-19 DIAGNOSIS — F900 Attention-deficit hyperactivity disorder, predominantly inattentive type: Secondary | ICD-10-CM

## 2017-02-19 DIAGNOSIS — Z813 Family history of other psychoactive substance abuse and dependence: Secondary | ICD-10-CM | POA: Diagnosis not present

## 2017-02-19 DIAGNOSIS — E039 Hypothyroidism, unspecified: Secondary | ICD-10-CM | POA: Diagnosis not present

## 2017-02-19 DIAGNOSIS — Z818 Family history of other mental and behavioral disorders: Secondary | ICD-10-CM

## 2017-02-19 MED ORDER — AMPHETAMINE-DEXTROAMPHETAMINE 20 MG PO TABS: 20.0000 mg | ORAL_TABLET | Freq: Two times a day (BID) | ORAL | 0 refills | Status: DC

## 2017-02-19 MED ORDER — ESCITALOPRAM OXALATE 20 MG PO TABS: 20.0000 mg | ORAL_TABLET | Freq: Every day | ORAL | 2 refills | Status: DC

## 2017-02-19 MED ORDER — CLONAZEPAM 1 MG PO TABS: 1.0000 mg | ORAL_TABLET | Freq: Three times a day (TID) | ORAL | 2 refills | Status: DC | PRN

## 2017-02-19 NOTE — Progress Notes (Signed)
BH MD/PA/NP OP Progress Note  02/19/2017 8:48 AM Kathleen Velasquez  MRN:  [id number]  Chief Complaint:  Chief Complaint    Depression; Anxiety; Follow-up     HPI: This patient is a 27 year old single white female who lives with her friend.  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  She states that she has been exceedingly tired and yesterday she had some blood work done at her primary care office.  They called her back to let her know that her TSH is high and she is probably suffering from hypothyroidism.  She states this makes sense because she is tired all the time no matter how much sleep she gets.  She is going to be starting on medication for this.  She is still trying to help care for her mother and now her uncle who recently stroke as well as work full-time.  Overall her mood is pretty good the clonazepam continues to help anxiety and Adderall helps with focus.  She denies serious symptoms of depression or suicidal ideation. Visit Diagnosis:    ICD-10-CM   1. Generalized anxiety disorder F41.1   2. Attention deficit hyperactivity disorder (ADHD), predominantly inattentive type F90.0     Past Psychiatric History: none  Past Medical History:  Past Medical History:  Diagnosis Date  . Abscess    right buttocks/thigh  . DUB (dysfunctional uterine bleeding) 05/25/2012  . Hydradenitis   . Polycystic ovaries     Past Surgical History:  Procedure Laterality Date  . ADENOIDECTOMY    . CHOLECYSTECTOMY  01/27/2012   Procedure: LAPAROSCOPIC CHOLECYSTECTOMY;  Surgeon: Fabio BeringBrent C Ziegler, MD;  Location: [facility];  Service: General;  Laterality: N/A;  . HERNIA REPAIR    . HYDRADENITIS EXCISION  10/02/2011   Procedure: EXCISION HYDRADENITIS AXILLA;  Surgeon: Fabio BeringBrent C Ziegler, MD;  Location: [facility];  Service: General;  Laterality: Right;  Right Axillary Dissection  . HYDRADENITIS EXCISION Left 10/19/2013   Procedure: INCISION OF  HIDRADENITIS SUPPURATIVA LEFT AXILLA;  Surgeon: Marlane HatcherWilliam S Bradford, MD;  Location: [facility];  Service: General;  Laterality: Left;  . TONSILLECTOMY      Family Psychiatric History: See below  Family History:  Family History  Problem Relation Age of Onset  . Diabetes Mother   . Hypertension Mother   . Hyperlipidemia Mother   . Neuropathy Mother   . Diabetes Maternal Grandmother   . Hypertension Maternal Grandmother   . Heart disease Maternal Grandmother        CHF  . COPD Maternal Grandmother   . Cirrhosis Maternal Grandmother   . Anxiety disorder Maternal Grandmother   . Cancer Maternal Grandfather        lung  . Diabetes Paternal Grandmother   . Diabetes Paternal Grandfather   . Aneurysm Paternal Grandfather   . Anxiety disorder Paternal Grandfather   . Drug abuse Father   . ADD / ADHD Brother   . Stroke Other   . Anxiety disorder Maternal Uncle   . Anxiety disorder Paternal Uncle   . Depression Paternal Uncle   . Anxiety disorder Maternal Aunt     Social History:  Social History   Socioeconomic History  . Marital status: Single    Spouse name: None  . Number of children: None  . Years of education: None  . Highest education level: None  Social Needs  . Financial resource strain: None  . Food insecurity -  worry: None  . Food insecurity - inability: None  . Transportation needs - medical: None  . Transportation needs - non-medical: None  Occupational History  . None  Tobacco Use  . Smoking status: Current Every Day Smoker    Packs/day: 0.50    Years: 7.00    Pack years: 3.50    Types: Cigarettes  . Smokeless tobacco: Never Used  Substance and Sexual Activity  . Alcohol use: No    Comment: social use . 01-19-14 per pt no  . Drug use: No  . Sexual activity: Yes    Birth control/protection: None  Other Topics Concern  . None  Social History Narrative  . None    Allergies:  Allergies  Allergen Reactions  . Bactrim Hives and Itching  . Ciprofloxacin  Hives and Itching    CRNA Discontinued preop antibiotic  IVPB  Cipro in OR after developed itching and hives left arm.  Received Benadryl with relief noted. Dr.Gonzalez and Dr. Leticia Penna informed. DDallasRN  . Other Other (See Comments)    [place] causes a hives, swelling, itching, and anaphylaxis.   Marland Kitchen Penicillins Hives and Itching    Has patient had a PCN reaction causing immediate rash, facial/tongue/throat swelling, SOB or lightheadedness with hypotension: Yes Has patient had a PCN reaction causing severe rash involving mucus membranes or skin necrosis: No Has patient had a PCN reaction that required hospitalization No Has patient had a PCN reaction occurring within the last 10 years: No If all of the above answers are "NO", then may proceed with Cephalosporin use.   Marland Kitchen Percocet [Oxycodone-Acetaminophen] Other (See Comments)    Face flushes    Metabolic Disorder Labs: Lab Results  Component Value Date   HGBA1C 5.7 (H) 05/25/2012   MPG 117 (H) 05/25/2012   No results found for: PROLACTIN No results found for: CHOL, TRIG, HDL, CHOLHDL, VLDL, LDLCALC Lab Results  Component Value Date   TSH 2.515 05/25/2012    Therapeutic Level Labs: No results found for: LITHIUM No results found for: VALPROATE No components found for:  CBMZ  Current Medications: Current Outpatient Medications  Medication Sig Dispense Refill  . acetaminophen (TYLENOL) 500 MG tablet Take 1,000 mg by mouth every 6 (six) hours as needed for moderate pain.    Marland Kitchen amphetamine-dextroamphetamine (ADDERALL) 20 MG tablet Take 1 tablet (20 mg total) by mouth 2 (two) times daily. 60 tablet 0  . amphetamine-dextroamphetamine (ADDERALL) 20 MG tablet Take 1 tablet (20 mg total) by mouth 2 (two) times daily. 60 tablet 0  . amphetamine-dextroamphetamine (ADDERALL) 20 MG tablet Take 1 tablet (20 mg total) by mouth 2 (two) times daily. 60 tablet 0  . benzonatate (TESSALON) 100 MG capsule Take 1 capsule (100 mg total) by mouth every 8  (eight) hours. 21 capsule 0  . clonazePAM (KLONOPIN) 1 MG tablet Take 1 tablet (1 mg total) by mouth 3 (three) times daily as needed for anxiety. 90 tablet 2  . escitalopram (LEXAPRO) 20 MG tablet Take 1 tablet (20 mg total) by mouth daily. 90 tablet 2  . ibuprofen (ADVIL,MOTRIN) 200 MG tablet Take 600 mg by mouth every 6 (six) hours as needed for moderate pain.    Marland Kitchen NUVARING 0.12-0.015 MG/24HR vaginal ring INSERT VAGINALLY AND LEAVE IN. PLACE FOR 3 CONSECUTIVE WEEKS, THEN REMOVE FOR 1 WEEK 1 each 10   No current facility-administered medications for this visit.      Musculoskeletal: Strength & Muscle Tone: within normal limits Gait & Station: normal Patient leans:  N/A  Psychiatric Specialty Exam: Review of Systems  Constitutional: Positive for malaise/fatigue.  All other systems reviewed and are negative.   Blood pressure 125/84, pulse 81, height 5\' 4"  (1.626 m), weight 255 lb (115.7 kg), SpO2 98 %.Body mass index is 43.77 kg/m.  General Appearance: Casual and Fairly Groomed  Eye Contact:  Good  Speech:  Clear and Coherent  Volume:  Normal  Mood:  Euthymic  Affect:  Congruent  Thought Process:  Goal Directed  Orientation:  Full (Time, Place, and Person)  Thought Content: WDL   Suicidal Thoughts:  No  Homicidal Thoughts:  No  Memory:  Immediate;   Good Recent;   Good Remote;   Good  Judgement:  Good  Insight:  Fair  Psychomotor Activity:  Decreased  Concentration:  Concentration: Good and Attention Span: Good  Recall:  Good  Fund of Knowledge: Good  Language: Good  Akathisia:  No  Handed:  Right  AIMS (if indicated): not done  Assets:  Communication Skills Desire for Improvement Physical Health Resilience Social Support Talents/Skills  ADL's:  Intact  Cognition: WNL  Sleep:  Good   Screenings:   Assessment and Plan: Patient is a 27 year old female with a history of depression anxiety ADHD and now hypothyroidism.  I am sure that she will feel better once her  thyroid hormones are adjusted.  For now she will continue Lexapro 20 mg daily for depression, clonazepam 1 mg 3 times daily as needed for anxiety and Adderall 20 mg twice a day for focus.  She will return to see me in 3 months   [name], MD 02/19/2017, 8:48 AM

## 2017-02-26 DIAGNOSIS — R112 Nausea with vomiting, unspecified: Secondary | ICD-10-CM | POA: Diagnosis not present

## 2017-03-18 DIAGNOSIS — R946 Abnormal results of thyroid function studies: Secondary | ICD-10-CM | POA: Diagnosis not present

## 2017-03-22 DIAGNOSIS — E039 Hypothyroidism, unspecified: Secondary | ICD-10-CM | POA: Diagnosis not present

## 2017-03-22 DIAGNOSIS — R5383 Other fatigue: Secondary | ICD-10-CM | POA: Diagnosis not present

## 2017-03-22 DIAGNOSIS — R0683 Snoring: Secondary | ICD-10-CM | POA: Diagnosis not present

## 2017-03-22 DIAGNOSIS — J00 Acute nasopharyngitis [common cold]: Secondary | ICD-10-CM | POA: Diagnosis not present

## 2017-03-24 ENCOUNTER — Other Ambulatory Visit (HOSPITAL_BASED_OUTPATIENT_CLINIC_OR_DEPARTMENT_OTHER): Payer: Self-pay

## 2017-03-24 DIAGNOSIS — R0683 Snoring: Secondary | ICD-10-CM

## 2017-03-28 ENCOUNTER — Ambulatory Visit: Payer: BLUE CROSS/BLUE SHIELD | Attending: Internal Medicine | Admitting: Neurology

## 2017-03-28 DIAGNOSIS — G4733 Obstructive sleep apnea (adult) (pediatric): Secondary | ICD-10-CM | POA: Diagnosis not present

## 2017-03-28 DIAGNOSIS — R0683 Snoring: Secondary | ICD-10-CM | POA: Diagnosis not present

## 2017-04-02 DIAGNOSIS — J060 Acute laryngopharyngitis: Secondary | ICD-10-CM | POA: Diagnosis not present

## 2017-04-03 NOTE — Procedures (Signed)
   [facility] Aleina Burgio A. Gerilyn Pilgrimoonquah, MD     [email or web address]             HOME SLEEP STUDY  LOCATION: [facility]   Patient Name: Kathleen Velasquez, Kathleen Velasquez Study Date: 03/28/2017 Gender: Female D.O.B: 01/14/1990 Age (years): 27 Referring Provider: Catalina PizzaZach Hall Height (inches): 65 Interpreting Physician: Beryle BeamsKofi Gage Treiber MD, ABSM Weight (lbs): 257 RPSGT: Alfonso EllisHedrick, Debra BMI: 43 MRN: [id number] Neck Size: CLINICAL INFORMATION Sleep Study Type: HST     Indication for sleep study: Snoring     Epworth Sleepiness Score: NA  SLEEP STUDY TECHNIQUE A multi-channel overnight portable sleep study was performed. The channels recorded were: nasal airflow, thoracic respiratory movement, and oxygen saturation with a pulse oximetry. Snoring was also monitored.  MEDICATIONS Patient self administered medications include: N/A.  Current Outpatient Medications:  .  acetaminophen (TYLENOL) 500 MG tablet, Take 1,000 mg by mouth every 6 (six) hours as needed for moderate pain., Disp: , Rfl:  .  amphetamine-dextroamphetamine (ADDERALL) 20 MG tablet, Take 1 tablet (20 mg total) by mouth 2 (two) times daily., Disp: 60 tablet, Rfl: 0 .  amphetamine-dextroamphetamine (ADDERALL) 20 MG tablet, Take 1 tablet (20 mg total) by mouth 2 (two) times daily., Disp: 60 tablet, Rfl: 0 .  amphetamine-dextroamphetamine (ADDERALL) 20 MG tablet, Take 1 tablet (20 mg total) by mouth 2 (two) times daily., Disp: 60 tablet, Rfl: 0 .  benzonatate (TESSALON) 100 MG capsule, Take 1 capsule (100 mg total) by mouth every 8 (eight) hours., Disp: 21 capsule, Rfl: 0 .  clonazePAM (KLONOPIN) 1 MG tablet, Take 1 tablet (1 mg total) by mouth 3 (three) times daily as needed for anxiety., Disp: 90 tablet, Rfl: 2 .  escitalopram (LEXAPRO) 20 MG tablet, Take 1 tablet (20 mg total) by mouth daily., Disp: 90 tablet, Rfl: 2 .  ibuprofen (ADVIL,MOTRIN) 200 MG tablet, Take 600 mg by mouth every 6 (six) hours as needed for moderate pain., Disp:  , Rfl:  .  NUVARING 0.12-0.015 MG/24HR vaginal ring, INSERT VAGINALLY AND LEAVE IN. PLACE FOR 3 CONSECUTIVE WEEKS, THEN REMOVE FOR 1 WEEK, Disp: 1 each, Rfl: 10   SLEEP ARCHITECTURE Patient was studied for 395.9 minutes. The sleep efficiency was 82.5 % and the patient was supine for 43.9%. The arousal index was 0.0 per hour.  RESPIRATORY PARAMETERS The overall AHI was 3.0 per hour, with a central apnea index of 0.6 per hour.  The oxygen nadir was 89% during sleep.     CARDIAC DATA Mean heart rate during sleep was 74.3 bpm.  IMPRESSIONS No significant obstructive sleep apnea occurred during this study (AHI = 3.0/h).    Argie RammingKofi A [place] Canty, MD Diplomate, American Board of Sleep Medicine.  ELECTRONICALLY SIGNED ON:  04/03/2017, 11:44 AM [facility] PH: [phone]   FX: [phone] ACCREDITED BY THE AMERICAN ACADEMY OF SLEEP MEDICINE

## 2017-04-09 ENCOUNTER — Other Ambulatory Visit: Payer: Self-pay

## 2017-04-09 ENCOUNTER — Emergency Department (HOSPITAL_COMMUNITY)
Admission: EM | Admit: 2017-04-09 | Discharge: 2017-04-09 | Disposition: A | Discharge status: Home / Self Care | Payer: BLUE CROSS/BLUE SHIELD | Attending: Emergency Medicine | Admitting: Emergency Medicine

## 2017-04-09 ENCOUNTER — Encounter (HOSPITAL_COMMUNITY): Payer: Self-pay | Admitting: *Deleted

## 2017-04-09 DIAGNOSIS — Z79899 Other long term (current) drug therapy: Secondary | ICD-10-CM | POA: Insufficient documentation

## 2017-04-09 DIAGNOSIS — M5417 Radiculopathy, lumbosacral region: Secondary | ICD-10-CM | POA: Diagnosis not present

## 2017-04-09 DIAGNOSIS — F17210 Nicotine dependence, cigarettes, uncomplicated: Secondary | ICD-10-CM | POA: Insufficient documentation

## 2017-04-09 DIAGNOSIS — Z9049 Acquired absence of other specified parts of digestive tract: Secondary | ICD-10-CM | POA: Insufficient documentation

## 2017-04-09 DIAGNOSIS — E039 Hypothyroidism, unspecified: Secondary | ICD-10-CM | POA: Insufficient documentation

## 2017-04-09 DIAGNOSIS — M545 Low back pain: Secondary | ICD-10-CM | POA: Diagnosis not present

## 2017-04-09 DIAGNOSIS — R200 Anesthesia of skin: Secondary | ICD-10-CM | POA: Diagnosis not present

## 2017-04-09 HISTORY — DX: Disorder of thyroid, unspecified: E07.9

## 2017-04-09 MED ORDER — IBUPROFEN 400 MG PO TABS: 400.0000 mg | ORAL_TABLET | Freq: Three times a day (TID) | ORAL | 0 refills | Status: DC | PRN

## 2017-04-09 MED ORDER — IBUPROFEN 400 MG PO TABS
400.0000 mg | ORAL_TABLET | Freq: Once | ORAL | Status: AC
  Administered 2017-04-09: 400 mg via ORAL
  Filled 2017-04-09: qty 1

## 2017-04-09 MED ORDER — CYCLOBENZAPRINE HCL 10 MG PO TABS: 10.0000 mg | ORAL_TABLET | Freq: Two times a day (BID) | ORAL | 0 refills | Status: DC | PRN

## 2017-04-09 NOTE — ED Provider Notes (Signed)
[facility] EMERGENCY DEPARTMENT Provider Note   CSN: [id number] Arrival date & time: 04/09/17  0024     History   Chief Complaint Chief Complaint  Patient presents with  . Back Pain    HPI Kathleen Velasquez is a 27 y.o. female.  The history is provided by the patient.  Back Pain   This is a new problem. The current episode started 1 to 2 hours ago. The problem occurs constantly. The problem has not changed since onset.The pain is associated with lifting heavy objects. The pain is present in the gluteal region. The quality of the pain is described as shooting. The pain radiates to the right thigh. The pain is severe. The symptoms are aggravated by certain positions and twisting. The pain is the same all the time. Associated symptoms include numbness. Pertinent negatives include no chest pain, no fever, no abdominal pain, no bladder incontinence and no paresis. She has tried nothing for the symptoms.  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  She reports feeling a pop and then had pain going from her back into her right leg  she reports numbness in the right leg. She reports she can ambulate Past Medical History:  Diagnosis Date  . Abscess    right buttocks/thigh  . DUB (dysfunctional uterine bleeding) 05/25/2012  . Hydradenitis   . Polycystic ovaries   . Thyroid disease    hypothyroidism    Patient Active Problem List   Diagnosis Date Noted  . Anovulatory (dysfunctional uterine) bleeding 06/07/2012  . DUB (dysfunctional uterine bleeding) 05/25/2012  . FOOT PAIN, CHRONIC 05/09/2007    Past Surgical History:  Procedure Laterality Date  . ADENOIDECTOMY    . CHOLECYSTECTOMY  01/27/2012   Procedure: LAPAROSCOPIC CHOLECYSTECTOMY;  Surgeon: Fabio Bering, MD;  Location: [facility];  Service: General;  Laterality: N/A;  . HERNIA REPAIR    . HYDRADENITIS EXCISION  10/02/2011   Procedure: EXCISION HYDRADENITIS AXILLA;   Surgeon: Fabio Bering, MD;  Location: [facility];  Service: General;  Laterality: Right;  Right Axillary Dissection  . HYDRADENITIS EXCISION Left 10/19/2013   Procedure: INCISION OF HIDRADENITIS SUPPURATIVA LEFT AXILLA;  Surgeon: Marlane Hatcher, MD;  Location: [facility];  Service: General;  Laterality: Left;  . TONSILLECTOMY       OB History    Gravida  0   Para      Term      Preterm      AB      Living  0     SAB      TAB      Ectopic      Multiple      Live Births               Home Medications    Prior to Admission medications   Medication Sig Start Date End Date Taking? Authorizing Provider  acetaminophen (TYLENOL) 500 MG tablet Take 1,000 mg by mouth every 6 (six) hours as needed for moderate pain.    [provider]  amphetamine-dextroamphetamine (ADDERALL) 20 MG tablet Take 1 tablet (20 mg total) by mouth 2 (two) times daily. 02/19/17 02/19/18  Myrlene Broker, MD  amphetamine-dextroamphetamine (ADDERALL) 20 MG tablet Take 1 tablet (20 mg total) by mouth 2 (two) times daily. 02/19/17 02/19/18  Myrlene Broker, MD  amphetamine-dextroamphetamine (ADDERALL) 20 MG tablet Take 1 tablet (20 mg total) by mouth  2 (two) times daily. 02/19/17 02/19/18  Myrlene Brokeross, Deborah R, MD  clonazePAM (KLONOPIN) 1 MG tablet Take 1 tablet (1 mg total) by mouth 3 (three) times daily as needed for anxiety. 02/19/17 02/19/18  Myrlene Brokeross, Deborah R, MD  cyclobenzaprine (FLEXERIL) 10 MG tablet Take 1 tablet (10 mg total) by mouth 2 (two) times daily as needed for muscle spasms. 04/09/17   Zadie RhineWickline, Antawn Sison, MD  escitalopram (LEXAPRO) 20 MG tablet Take 1 tablet (20 mg total) by mouth daily. 02/19/17 02/19/18  Myrlene Brokeross, Deborah R, MD  ibuprofen (ADVIL,MOTRIN) 400 MG tablet Take 1 tablet (400 mg total) by mouth every 8 (eight) hours as needed for moderate pain. 04/09/17   Zadie RhineWickline, Nefertari Rebman, MD  NUVARING 0.12-0.015 MG/24HR vaginal ring INSERT VAGINALLY AND LEAVE IN. PLACE FOR 3 CONSECUTIVE WEEKS, THEN REMOVE FOR 1 WEEK  11/28/15   Constant, Peggy, MD    Family History Family History  Problem Relation Age of Onset  . Diabetes Mother   . Hypertension Mother   . Hyperlipidemia Mother   . Neuropathy Mother   . Diabetes Maternal Grandmother   . Hypertension Maternal Grandmother   . Heart disease Maternal Grandmother        CHF  . COPD Maternal Grandmother   . Cirrhosis Maternal Grandmother   . Anxiety disorder Maternal Grandmother   . Cancer Maternal Grandfather        lung  . Diabetes Paternal Grandmother   . Diabetes Paternal Grandfather   . Aneurysm Paternal Grandfather   . Anxiety disorder Paternal Grandfather   . Drug abuse Father   . ADD / ADHD Brother   . Stroke Other   . Anxiety disorder Maternal Uncle   . Anxiety disorder Paternal Uncle   . Depression Paternal Uncle   . Anxiety disorder Maternal Aunt     Social History Social History   Tobacco Use  . Smoking status: Current Every Day Smoker    Packs/day: 0.50    Years: 7.00    Pack years: 3.50    Types: Cigarettes  . Smokeless tobacco: Never Used  Substance Use Topics  . Alcohol use: No    Comment: social use . 01-19-14 per pt no  . Drug use: No     Allergies   Bactrim; Ciprofloxacin; Other; Penicillins; and Percocet [oxycodone-acetaminophen]   Review of Systems Review of Systems  Constitutional: Negative for fever.  Cardiovascular: Negative for chest pain.  Gastrointestinal: Negative for abdominal pain.  Genitourinary: Negative for bladder incontinence and difficulty urinating.  Musculoskeletal: Positive for back pain.  Neurological: Positive for numbness.  All other systems reviewed and are negative.    Physical Exam Updated Vital Signs BP 132/90 (BP Location: Left Arm)   Pulse 90   Temp 98.8 F (37.1 C) (Oral)   Resp 18   Ht 1.626 m (5\' 4" )   Wt 100.7 kg (222 lb)   SpO2 99%   BMI 38.11 kg/m   Physical Exam CONSTITUTIONAL: Well developed/well nourished HEAD: Normocephalic/atraumatic EYES:  EOMI/PERRL ENMT: Mucous membranes moist NECK: supple no meningeal signs SPINE/BACK:entire spine nontender , lumbar paraspinal tenderness CV: S1/S2 noted, no murmurs/rubs/gallops noted LUNGS: Lungs are clear to auscultation bilaterally, no apparent distress ABDOMEN: soft, nontender, no rebound or guarding GU:no cva tenderness NEURO: Awake/alert, equal motor 5/5 strength noted with the following: hip flexion/knee flexion/extension,   great toe extension intact bilaterally, no clonus bilaterally, plantar reflex appropriate (toes downgoing), no sensory deficit in any dermatome.  Equal patellar/achilles reflex noted (2+) in bilateral lower extremities.  Pt with mild weakness right plantar flexion EXTREMITIES: pulses normal, full ROM SKIN: warm, color normal PSYCH: no abnormalities of mood noted, alert and oriented to situation    ED Treatments / Results  Labs (all labs ordered are listed, but only abnormal results are displayed) Labs Reviewed - No data to display  EKG None  Radiology No results found.  Procedures Procedures (including critical care time)  Medications Ordered in ED Medications  ibuprofen (ADVIL,MOTRIN) tablet 400 mg (400 mg Oral Given 04/09/17 0143)     Initial Impression / Assessment and Plan / ED Course  I have reviewed the triage vital signs and the nursing notes.   Suspicion for lumbosacral radiculopathy, will treat with conservative measures.  Will refer to PCP.  We discussed strict return precautions  Final Clinical Impressions(s) / ED Diagnoses   Final diagnoses:  Lumbosacral radiculopathy    ED Discharge Orders        Ordered    ibuprofen (ADVIL,MOTRIN) 400 MG tablet  Every 8 hours PRN     04/09/17 0127    cyclobenzaprine (FLEXERIL) 10 MG tablet  2 times daily PRN     04/09/17 0127       Zadie Rhine, MD 04/09/17 [phone]

## 2017-04-09 NOTE — ED Triage Notes (Signed)
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

## 2017-04-09 NOTE — Discharge Instructions (Addendum)

## 2017-04-16 DIAGNOSIS — Z6841 Body Mass Index (BMI) 40.0 and over, adult: Secondary | ICD-10-CM | POA: Diagnosis not present

## 2017-04-16 DIAGNOSIS — M79604 Pain in right leg: Secondary | ICD-10-CM | POA: Diagnosis not present

## 2017-04-16 DIAGNOSIS — M545 Low back pain: Secondary | ICD-10-CM | POA: Diagnosis not present

## 2017-04-16 DIAGNOSIS — R202 Paresthesia of skin: Secondary | ICD-10-CM | POA: Diagnosis not present

## 2017-04-19 ENCOUNTER — Telehealth (HOSPITAL_COMMUNITY): Payer: Self-pay | Admitting: *Deleted

## 2017-04-19 ENCOUNTER — Other Ambulatory Visit (HOSPITAL_COMMUNITY): Payer: Self-pay | Admitting: Psychiatry

## 2017-04-19 MED ORDER — AMPHETAMINE-DEXTROAMPHETAMINE 20 MG PO TABS: 20.0000 mg | ORAL_TABLET | Freq: Two times a day (BID) | ORAL | 0 refills | Status: DC

## 2017-04-19 NOTE — Telephone Encounter (Signed)
Dr Tenny Crawoss  Pharmacy called & one of the 3 Adderall was mis dated . Please resend one for a 04/19/17

## 2017-04-19 NOTE — Telephone Encounter (Signed)
sent 

## 2017-04-22 ENCOUNTER — Emergency Department (HOSPITAL_COMMUNITY)
Admission: EM | Admit: 2017-04-22 | Discharge: 2017-04-22 | Disposition: A | Discharge status: Home / Self Care | Payer: BLUE CROSS/BLUE SHIELD | Attending: Emergency Medicine | Admitting: Emergency Medicine

## 2017-04-22 ENCOUNTER — Other Ambulatory Visit: Payer: Self-pay

## 2017-04-22 ENCOUNTER — Encounter (HOSPITAL_COMMUNITY): Payer: Self-pay | Admitting: Emergency Medicine

## 2017-04-22 ENCOUNTER — Emergency Department (HOSPITAL_COMMUNITY): Payer: BLUE CROSS/BLUE SHIELD

## 2017-04-22 DIAGNOSIS — S9031XA Contusion of right foot, initial encounter: Secondary | ICD-10-CM | POA: Diagnosis not present

## 2017-04-22 DIAGNOSIS — F17210 Nicotine dependence, cigarettes, uncomplicated: Secondary | ICD-10-CM | POA: Diagnosis not present

## 2017-04-22 DIAGNOSIS — E039 Hypothyroidism, unspecified: Secondary | ICD-10-CM | POA: Diagnosis not present

## 2017-04-22 DIAGNOSIS — M79674 Pain in right toe(s): Secondary | ICD-10-CM | POA: Insufficient documentation

## 2017-04-22 DIAGNOSIS — W2203XA Walked into furniture, initial encounter: Secondary | ICD-10-CM | POA: Insufficient documentation

## 2017-04-22 DIAGNOSIS — S99921A Unspecified injury of right foot, initial encounter: Secondary | ICD-10-CM | POA: Diagnosis not present

## 2017-04-22 DIAGNOSIS — Y999 Unspecified external cause status: Secondary | ICD-10-CM | POA: Diagnosis not present

## 2017-04-22 DIAGNOSIS — Z79899 Other long term (current) drug therapy: Secondary | ICD-10-CM | POA: Diagnosis not present

## 2017-04-22 DIAGNOSIS — Y929 Unspecified place or not applicable: Secondary | ICD-10-CM | POA: Diagnosis not present

## 2017-04-22 DIAGNOSIS — Y939 Activity, unspecified: Secondary | ICD-10-CM | POA: Diagnosis not present

## 2017-04-22 DIAGNOSIS — M79671 Pain in right foot: Secondary | ICD-10-CM | POA: Diagnosis not present

## 2017-04-22 MED ORDER — IBUPROFEN 600 MG PO TABS: 600.0000 mg | ORAL_TABLET | Freq: Four times a day (QID) | ORAL | 0 refills | Status: DC

## 2017-04-22 NOTE — ED Provider Notes (Signed)
[facility] EMERGENCY DEPARTMENT Provider Note   CSN: [id number] Arrival date & time: 04/22/17  1122     History   Chief Complaint Chief Complaint  Patient presents with  . Foot Pain    HPI Kathleen Velasquez is a 27 y.o. female.  Pt hit the right foot on the bed 1 week ago. Injured the right 5th toe yesterday. She does a lot of standing and walking at her job. Pain and swelling getting worse.  The history is provided by the patient.  Foot Pain  The current episode started yesterday. The problem occurs constantly. The problem has been gradually worsening. Pertinent negatives include no chest pain, no abdominal pain and no shortness of breath. Associated symptoms comments: Right foot swelling and pain. . The symptoms are aggravated by walking and standing. Nothing relieves the symptoms. She has tried acetaminophen for the symptoms. The treatment provided no relief.    Past Medical History:  Diagnosis Date  . Abscess    right buttocks/thigh  . DUB (dysfunctional uterine bleeding) 05/25/2012  . Hydradenitis   . Polycystic ovaries   . Thyroid disease    hypothyroidism    Patient Active Problem List   Diagnosis Date Noted  . Anovulatory (dysfunctional uterine) bleeding 06/07/2012  . DUB (dysfunctional uterine bleeding) 05/25/2012  . FOOT PAIN, CHRONIC 05/09/2007    Past Surgical History:  Procedure Laterality Date  . ADENOIDECTOMY    . CHOLECYSTECTOMY  01/27/2012   Procedure: LAPAROSCOPIC CHOLECYSTECTOMY;  Surgeon: Fabio Bering, MD;  Location: [facility];  Service: General;  Laterality: N/A;  . HERNIA REPAIR    . HYDRADENITIS EXCISION  10/02/2011   Procedure: EXCISION HYDRADENITIS AXILLA;  Surgeon: Fabio Bering, MD;  Location: [facility];  Service: General;  Laterality: Right;  Right Axillary Dissection  . HYDRADENITIS EXCISION Left 10/19/2013   Procedure: INCISION OF HIDRADENITIS SUPPURATIVA LEFT AXILLA;  Surgeon: Marlane Hatcher, MD;  Location: [facility];  Service: General;   Laterality: Left;  . TONSILLECTOMY       OB History    Gravida  0   Para      Term      Preterm      AB      Living  0     SAB      TAB      Ectopic      Multiple      Live Births               Home Medications    Prior to Admission medications   Medication Sig Start Date End Date Taking? Authorizing Provider  acetaminophen (TYLENOL) 500 MG tablet Take 1,000 mg by mouth every 6 (six) hours as needed for moderate pain.   Yes [provider]  amphetamine-dextroamphetamine (ADDERALL) 20 MG tablet Take 1 tablet (20 mg total) by mouth 2 (two) times daily. 02/19/17 02/19/18 Yes Myrlene Broker, MD  clonazePAM (KLONOPIN) 1 MG tablet Take 1 tablet (1 mg total) by mouth 3 (three) times daily as needed for anxiety. 02/19/17 02/19/18 Yes Myrlene Broker, MD  escitalopram (LEXAPRO) 20 MG tablet Take 1 tablet (20 mg total) by mouth daily. 02/19/17 02/19/18 Yes Myrlene Broker, MD  NUVARING 0.12-0.015 MG/24HR vaginal ring INSERT VAGINALLY AND LEAVE IN. PLACE FOR 3 CONSECUTIVE WEEKS, THEN REMOVE FOR 1 WEEK 11/28/15  Yes Constant, Peggy, MD  cyclobenzaprine (FLEXERIL) 10 MG tablet Take 1 tablet (10 mg total) by mouth 2 (two) times daily as needed for  muscle spasms. Patient not taking: Reported on 04/22/2017 04/09/17   Zadie Rhine, MD  ibuprofen (ADVIL,MOTRIN) 400 MG tablet Take 1 tablet (400 mg total) by mouth every 8 (eight) hours as needed for moderate pain. Patient not taking: Reported on 04/22/2017 04/09/17   Zadie Rhine, MD    Family History Family History  Problem Relation Age of Onset  . Diabetes Mother   . Hypertension Mother   . Hyperlipidemia Mother   . Neuropathy Mother   . Diabetes Maternal Grandmother   . Hypertension Maternal Grandmother   . Heart disease Maternal Grandmother        CHF  . COPD Maternal Grandmother   . Cirrhosis Maternal Grandmother   . Anxiety disorder Maternal Grandmother   . Cancer Maternal Grandfather        lung  . Diabetes  Paternal Grandmother   . Diabetes Paternal Grandfather   . Aneurysm Paternal Grandfather   . Anxiety disorder Paternal Grandfather   . Drug abuse Father   . ADD / ADHD Brother   . Stroke Other   . Anxiety disorder Maternal Uncle   . Anxiety disorder Paternal Uncle   . Depression Paternal Uncle   . Anxiety disorder Maternal Aunt     Social History Social History   Tobacco Use  . Smoking status: Current Every Day Smoker    Packs/day: 0.50    Years: 7.00    Pack years: 3.50    Types: Cigarettes  . Smokeless tobacco: Never Used  Substance Use Topics  . Alcohol use: No    Comment: social use . 01-19-14 per pt no  . Drug use: No     Allergies   Bactrim; Ciprofloxacin; Other; Penicillins; and Percocet [oxycodone-acetaminophen]   Review of Systems Review of Systems  Constitutional: Negative for activity change.       All ROS Neg except as noted in HPI  HENT: Negative for nosebleeds.   Eyes: Negative for photophobia and discharge.  Respiratory: Negative for cough, shortness of breath and wheezing.   Cardiovascular: Negative for chest pain and palpitations.  Gastrointestinal: Negative for abdominal pain and blood in stool.  Genitourinary: Negative for dysuria, frequency and hematuria.  Musculoskeletal: Negative for arthralgias, back pain and neck pain.       Foot pain  Skin: Negative.   Neurological: Negative for dizziness, seizures and speech difficulty.  Psychiatric/Behavioral: Negative for confusion and hallucinations.     Physical Exam Updated Vital Signs BP 133/79 (BP Location: Left Arm)   Pulse 80   Temp 98.7 F (37.1 C) (Oral)   Resp 18   Ht 5\' 4"  (1.626 m)   Wt 100.7 kg (222 lb)   SpO2 99%   BMI 38.11 kg/m   Physical Exam  Constitutional: She is oriented to person, place, and time. She appears well-developed and well-nourished.  Non-toxic appearance.  HENT:  Head: Normocephalic.  Right Ear: Tympanic membrane and external ear normal.  Left Ear:  Tympanic membrane and external ear normal.  Eyes: Pupils are equal, round, and reactive to light. EOM and lids are normal.  Neck: Normal range of motion. Neck supple. Carotid bruit is not present.  Cardiovascular: Normal rate, regular rhythm, normal heart sounds, intact distal pulses and normal pulses.  Pulmonary/Chest: Breath sounds normal. No respiratory distress.  Abdominal: Soft. Bowel sounds are normal. There is no tenderness. There is no guarding.  Musculoskeletal: Normal range of motion.       Right foot: There is tenderness and swelling. There is no  deformity.       Feet:  Lymphadenopathy:       Head (right side): No submandibular adenopathy present.       Head (left side): No submandibular adenopathy present.    She has no cervical adenopathy.  Neurological: She is alert and oriented to person, place, and time. She has normal strength. No cranial nerve deficit or sensory deficit.  Skin: Skin is warm and dry.  Psychiatric: She has a normal mood and affect. Her speech is normal.  Nursing note and vitals reviewed.    ED Treatments / Results  Labs (all labs ordered are listed, but only abnormal results are displayed) Labs Reviewed - No data to display  EKG None  Radiology Dg Foot Complete Right  Result Date: 04/22/2017 CLINICAL DATA:  Patient complains of right foot pain states she broke her toe a week ago. She states she hit her right 5th toe yesterday while vacuuming at home and may have broken it as well. EXAM: RIGHT FOOT COMPLETE - 3+ VIEW COMPARISON:  09/14/2011 FINDINGS: No fracture.  No bone lesion. Joints are normally spaced aligned. Moderate-sized plantar and dorsal calcaneal spurs. Mild soft tissue swelling of the fifth toe. IMPRESSION: 1. No fracture or dislocation Electronically Signed   By: Amie Portlandavid  Ormond M.D.   On: 04/22/2017 12:17    Procedures Procedures (including critical care time)  Medications Ordered in ED Medications - No data to display   Initial  Impression / Assessment and Plan / ED Course  I have reviewed the triage vital signs and the nursing notes.  Pertinent labs & imaging results that were available during my care of the patient were reviewed by me and considered in my medical decision making (see chart for details).       Final Clinical Impressions(s) / ED Diagnoses  MDm Patient injured the right foot when she struck bed accidentally approximately 5 days ago.  Most recently she injured the right fifth toe.  She has some bruising of the dorsum of the foot, she has swelling of the second and third toe.  She has some tenderness to the fifth toe.  X-ray of the foot is negative for fracture or dislocation.  There are no neurovascular deficits appreciated. I have asked the patient to keep the foot elevated above the waist when sitting and above the heart when lying down.  Prescription for ibuprofen given.  The patient is to follow-up with orthopedics if not improving.  Patient is in agreement with this plan.    Final diagnoses:  Contusion of right foot, initial encounter    ED Discharge Orders    None       Ivery QualeBryant, Layni Kreamer, PA-C 04/22/17 1400    Mesner, Barbara CowerJason, MD 04/22/17 1614

## 2017-04-22 NOTE — ED Notes (Signed)
Patient transported to X-ray 

## 2017-04-22 NOTE — ED Triage Notes (Signed)
Patient complains of right foot pain states she broke her toe a week ago. She states she hit her right 5th toe yesterday while vacuuming at home and may have broken it as well.

## 2017-04-22 NOTE — Discharge Instructions (Addendum)
The x-ray of your foot is negative for fracture or dislocation.  Your vital signs are within normal limits.  Please keep your foot elevated above your waist when you are sitting and above your heart when you are lying down as much as possible.  Use ibuprofen with breakfast, lunch, dinner, and at bedtime for swelling and pain.  Please see Dr. Romeo AppleHarrison for orthopedic evaluation if this pain and swelling continues.

## 2017-05-12 ENCOUNTER — Other Ambulatory Visit (HOSPITAL_COMMUNITY): Payer: Self-pay | Admitting: Psychiatry

## 2017-05-19 ENCOUNTER — Encounter (HOSPITAL_COMMUNITY): Payer: Self-pay | Admitting: Psychiatry

## 2017-05-19 ENCOUNTER — Ambulatory Visit (HOSPITAL_COMMUNITY): Payer: BLUE CROSS/BLUE SHIELD | Admitting: Psychiatry

## 2017-05-19 VITALS — BP 125/81 | HR 79 | Ht 64.0 in | Wt 255.0 lb

## 2017-05-19 DIAGNOSIS — Z818 Family history of other mental and behavioral disorders: Secondary | ICD-10-CM

## 2017-05-19 DIAGNOSIS — Z813 Family history of other psychoactive substance abuse and dependence: Secondary | ICD-10-CM | POA: Diagnosis not present

## 2017-05-19 DIAGNOSIS — F17210 Nicotine dependence, cigarettes, uncomplicated: Secondary | ICD-10-CM

## 2017-05-19 DIAGNOSIS — F900 Attention-deficit hyperactivity disorder, predominantly inattentive type: Secondary | ICD-10-CM

## 2017-05-19 DIAGNOSIS — F411 Generalized anxiety disorder: Secondary | ICD-10-CM

## 2017-05-19 MED ORDER — AMPHETAMINE-DEXTROAMPHETAMINE 20 MG PO TABS: 20.0000 mg | ORAL_TABLET | Freq: Two times a day (BID) | ORAL | 0 refills | Status: DC

## 2017-05-19 MED ORDER — ESCITALOPRAM OXALATE 20 MG PO TABS: 20.0000 mg | ORAL_TABLET | Freq: Every day | ORAL | 2 refills | Status: DC

## 2017-05-19 MED ORDER — CLONAZEPAM 1 MG PO TABS: 1.0000 mg | ORAL_TABLET | Freq: Three times a day (TID) | ORAL | 2 refills | Status: DC | PRN

## 2017-05-19 NOTE — Progress Notes (Signed)
BH MD/PA/NP OP Progress Note  05/19/2017 1:24 PM Kathleen Velasquez  MRN:  [id number]  Chief Complaint:  Chief Complaint    Depression; Anxiety; Follow-up     HPI: This patient is a 27 year old single white female who lives with a roommate.  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  The patient returns after 3 months for follow-up regarding depression anxiety and ADHD.  For the most part she is doing well.  She is transitioning to a new job in [place] at a facility where she will not have as many patients at one time and have more opportunities to advance her education.  She is really excited about it.  She still stays tired all the time.  At one point her primary provider thought she was hypothyroid and put her on Synthroid but then took her off after a month.  This does not make a lot of sense and I urged her to get a referral to an endocrinologist.  In general her mood is good his anxiety is under good control and she is sleeping well.  She is staying focused with the Adderall Visit Diagnosis:    ICD-10-CM   1. Generalized anxiety disorder F41.1   2. Attention deficit hyperactivity disorder (ADHD), predominantly inattentive type F90.0     Past Psychiatric History: none  Past Medical History:  Past Medical History:  Diagnosis Date  . Abscess    right buttocks/thigh  . DUB (dysfunctional uterine bleeding) 05/25/2012  . Hydradenitis   . Polycystic ovaries   . Thyroid disease    hypothyroidism    Past Surgical History:  Procedure Laterality Date  . ADENOIDECTOMY    . CHOLECYSTECTOMY  01/27/2012   Procedure: LAPAROSCOPIC CHOLECYSTECTOMY;  Surgeon: Fabio Bering, MD;  Location: [facility];  Service: General;  Laterality: N/A;  . HERNIA REPAIR    . HYDRADENITIS EXCISION  10/02/2011   Procedure: EXCISION HYDRADENITIS AXILLA;  Surgeon: Fabio Bering, MD;  Location: [facility];  Service: General;  Laterality: Right;  Right Axillary Dissection  . HYDRADENITIS EXCISION Left 10/19/2013   Procedure: INCISION OF HIDRADENITIS SUPPURATIVA LEFT AXILLA;  Surgeon: Marlane Hatcher, MD;  Location: [facility];  Service: General;  Laterality: Left;  . TONSILLECTOMY      Family Psychiatric History: See below  Family History:  Family History  Problem Relation Age of Onset  . Diabetes Mother   . Hypertension Mother   . Hyperlipidemia Mother   . Neuropathy Mother   . Diabetes Maternal Grandmother   . Hypertension Maternal Grandmother   . Heart disease Maternal Grandmother        CHF  . COPD Maternal Grandmother   . Cirrhosis Maternal Grandmother   . Anxiety disorder Maternal Grandmother   . Cancer Maternal Grandfather        lung  . Diabetes Paternal Grandmother   . Diabetes Paternal Grandfather   . Aneurysm Paternal Grandfather   . Anxiety disorder Paternal Grandfather   . Drug abuse Father   . ADD / ADHD Brother   . Stroke Other   . Anxiety disorder Maternal Uncle   . Anxiety disorder Paternal Uncle   . Depression Paternal Uncle   . Anxiety disorder Maternal Aunt     Social History:  Social History   Socioeconomic History  . Marital status: Single    Spouse name: Not on file  . Number of children: Not on file  . Years of education: Not on file  . Highest  education level: Not on file  Occupational History  . Not on file  Social Needs  . Financial resource strain: Not on file  . Food insecurity:    Worry: Not on file    Inability: Not on file  . Transportation needs:    Medical: Not on file    Non-medical: Not on file  Tobacco Use  . Smoking status: Current Every Day Smoker    Packs/day: 0.50    Years: 7.00    Pack years: 3.50    Types: Cigarettes  . Smokeless tobacco: Never Used  Substance and Sexual Activity  . Alcohol use: No    Comment: social use . 01-19-14 per pt no  . Drug use: No  . Sexual activity: Yes    Birth control/protection: None  Lifestyle  . Physical activity:    Days per week: Not on file    Minutes per session: Not on file  .  Stress: Not on file  Relationships  . Social connections:    Talks on phone: Not on file    Gets together: Not on file    Attends religious service: Not on file    Active member of club or organization: Not on file    Attends meetings of clubs or organizations: Not on file    Relationship status: Not on file  Other Topics Concern  . Not on file  Social History Narrative  . Not on file    Allergies:  Allergies  Allergen Reactions  . Bactrim Hives and Itching  . Ciprofloxacin Hives and Itching    [facility] Discontinued preop antibiotic  IVPB  Cipro in OR after developed itching and hives left arm.  Received Benadryl with relief noted. Dr.Gonzalez and Dr. Leticia Penna informed. DDallasRN  . Other Other (See Comments)    [place] causes a hives, swelling, itching, and anaphylaxis.   Marland Kitchen Penicillins Hives and Itching    Has patient had a PCN reaction causing immediate rash, facial/tongue/throat swelling, SOB or lightheadedness with hypotension: Yes Has patient had a PCN reaction causing severe rash involving mucus membranes or skin necrosis: No Has patient had a PCN reaction that required hospitalization No Has patient had a PCN reaction occurring within the last 10 years: No If all of the above answers are "NO", then may proceed with Cephalosporin use.   Marland Kitchen Percocet [Oxycodone-Acetaminophen] Other (See Comments)    Face flushes    Metabolic Disorder Labs: Lab Results  Component Value Date   HGBA1C 5.7 (H) 05/25/2012   MPG 117 (H) 05/25/2012   No results found for: PROLACTIN No results found for: CHOL, TRIG, HDL, CHOLHDL, VLDL, LDLCALC Lab Results  Component Value Date   TSH 2.515 05/25/2012    Therapeutic Level Labs: No results found for: LITHIUM No results found for: VALPROATE No components found for:  CBMZ  Current Medications: Current Outpatient Medications  Medication Sig Dispense Refill  . acetaminophen (TYLENOL) 500 MG tablet Take 1,000 mg by mouth every 6 (six) hours as  needed for moderate pain.    Marland Kitchen amphetamine-dextroamphetamine (ADDERALL) 20 MG tablet Take 1 tablet (20 mg total) by mouth 2 (two) times daily. 60 tablet 0  . clonazePAM (KLONOPIN) 1 MG tablet Take 1 tablet (1 mg total) by mouth 3 (three) times daily as needed for anxiety. 90 tablet 2  . cyclobenzaprine (FLEXERIL) 10 MG tablet Take 1 tablet (10 mg total) by mouth 2 (two) times daily as needed for muscle spasms. 20 tablet 0  . escitalopram (LEXAPRO) 20  MG tablet Take 1 tablet (20 mg total) by mouth daily. 90 tablet 2  . ibuprofen (ADVIL,MOTRIN) 600 MG tablet Take 1 tablet (600 mg total) by mouth 4 (four) times daily. 30 tablet 0  . NUVARING 0.12-0.015 MG/24HR vaginal ring INSERT VAGINALLY AND LEAVE IN. PLACE FOR 3 CONSECUTIVE WEEKS, THEN REMOVE FOR 1 WEEK 1 each 10  . amphetamine-dextroamphetamine (ADDERALL) 20 MG tablet Take 1 tablet (20 mg total) by mouth 2 (two) times daily. 60 tablet 0  . amphetamine-dextroamphetamine (ADDERALL) 20 MG tablet Take 1 tablet (20 mg total) by mouth 2 (two) times daily. 60 tablet 0   No current facility-administered medications for this visit.      Musculoskeletal: Strength & Muscle Tone: within normal limits Gait & Station: normal Patient leans: N/A  Psychiatric Specialty Exam: Review of Systems  Constitutional: Positive for malaise/fatigue.    Blood pressure 125/81, pulse 79, height  (1.626 m), weight 255 lb (115.7 kg), SpO2 97 %.Body mass index is 43.77 kg/m.  General Appearance: Casual and Fairly Groomed  Eye Contact:  Good  Speech:  Clear and Coherent  Volume:  Normal  Mood:  Euthymic  Affect:  Appropriate  Thought Process:  Goal Directed  Orientation:  Full (Time, Place, and Person)  Thought Content: WDL   Suicidal Thoughts:  No  Homicidal Thoughts:  No  Memory:  Immediate;   Good Recent;   Good Remote;   Good  Judgement:  Good  Insight:  Good  Psychomotor Activity:  Normal  Concentration:  Concentration: Good and Attention Span:  Good  Recall:  Good  Fund of Knowledge: Good  Language: Good  Akathisia:  No  Handed:  Right  AIMS (if indicated): not done  Assets:  Communication Skills Desire for Improvement Physical Health Resilience Social Support Talents/Skills  ADL's:  Intact  Cognition: WNL  Sleep:  Good   Screenings:   Assessment and Plan: This patient is a 27 year old female with a history of depression anxiety and ADHD.  She is doing very well on her current regimen.  She will continue Lexapro 20 mg daily for depression, clonazepam 1 mg 3 times daily as needed for anxiety and Adderall 20 mg twice a day for ADHD.  She will return to see me in 3 months   [name], MD 05/19/2017, 1:24 PM

## 2017-06-24 DIAGNOSIS — L732 Hidradenitis suppurativa: Secondary | ICD-10-CM | POA: Diagnosis not present

## 2017-07-09 ENCOUNTER — Ambulatory Visit: Payer: Self-pay | Admitting: Surgery

## 2017-07-09 DIAGNOSIS — L732 Hidradenitis suppurativa: Secondary | ICD-10-CM | POA: Diagnosis not present

## 2017-07-09 NOTE — H&P (Signed)
CC: Here for evaluation of possible bilateral hidradenitis  HPI: Kathleen Velasquez is a pleasant 27yoF with hx of ADHD here for evaluation of possible bilateral hidradenitis to both axillas. She has had these for many years. She has intermittent pain which she previously was presenting to the hospital for evaluation of but drainage procedures were too painful social in managing this on her own at home. She does report a history of a somewhat limited excision of some disease in her left axilla as well as an extensive excision of her right axilla in the past. She states that she had about 6 years of relief out of the procedure on her right axilla but her symptoms have pretty much persisted on her left side. Today she complains of discomfort in both axillas. She denies complaints of hidradenitis other portions her body including neck gluteal fold or groins. She denies any fever or chills. She denies any active drainage. She is not currently taking any antibiotics.  PMH: ADHD managed with Adderall  PSH: Small excision, left axilla hidradenitis and reasonable. Extensive excision of right axilla many years ago.  FHx: Denies FHx of malignancy  Social: Smokes 1/2 PPD; EtOH socially; denies illicit drug use. 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  ROS: A comprehensive 10 system review of systems was completed with the patient and pertinent findings as noted above.  The patient is a 27 year old female.   Past Surgical History (Kathleen Velasquez, RMA; 07/09/2017 11:12 AM) Gallbladder Surgery - Laparoscopic  Tonsillectomy   Diagnostic Studies History (Kathleen Velasquez, RMA; 07/09/2017 11:12 AM) Colonoscopy  never Mammogram  never Pap Smear  1-5 years ago  Allergies (Kathleen Velasquez, RMA; 07/09/2017 11:14 AM) Penicillins  Percocet *ANALGESICS - OPIOID*  Bactrim *ANTI-INFECTIVE AGENTS - MISC.*  Cipro *FLUOROQUINOLONES*  Allergies Reconciled   Medication History (Kathleen Velasquez,  RMA; 07/09/2017 11:14 AM) No Current Medications Medications Reconciled  Social History (Kathleen Velasquez, RMA; 07/09/2017 11:12 AM) Alcohol use  Occasional alcohol use. Caffeine use  Carbonated beverages. No drug use  Tobacco use  Current every day smoker.  Family History (Kathleen Velasquez, RMA; 07/09/2017 11:12 AM) Arthritis  Family Members In General, Mother. Diabetes Mellitus  Family Members In General, Mother. Heart Disease  Family Members In General, Mother. Heart disease in female family member before age 16  Hypertension  Family Members In General, Mother.  Pregnancy / Birth History (Kathleen Velasquez, RMA; 07/09/2017 11:12 AM) Age at menarche  9 years. Gravida  0 Irregular periods  Para  0  Other Problems (Kathleen Velasquez, RMA; 07/09/2017 11:12 AM) Back Pain  Thyroid Disease     Review of Systems Kathleen Deer M. Patrisia Faeth MD; 07/09/2017 11:18 AM) General Present- Fatigue and Night Sweats. Not Present- Appetite Loss, Chills, Fever, Weight Gain and Weight Loss. Skin Present- Non-Healing Wounds. Not Present- Change in Wart/Mole, Dryness, Hives, Jaundice, New Lesions, Rash and Ulcer. HEENT Present- Wears glasses/contact lenses. Not Present- Earache, Hearing Loss, Hoarseness, Nose Bleed, Oral Ulcers, Ringing in the Ears, Seasonal Allergies, Sinus Pain, Sore Throat, Visual Disturbances and Yellow Eyes. Cardiovascular Present- Palpitations. Not Present- Chest Pain, Difficulty Breathing Lying Down, Leg Cramps, Rapid Heart Rate, Shortness of Breath and Swelling of Extremities. Gastrointestinal Not Present- Abdominal Pain, Nausea and Vomiting. Musculoskeletal Present- Back Pain. Not Present- Joint Pain, Joint Stiffness, Muscle Pain, Muscle Weakness and Swelling of Extremities. Neurological Not Present- Decreased Memory and Difficulty Speaking. Psychiatric Not Present- Fearful and Hallucinations. Endocrine Not Present-  Appetite Changes and Cold Intolerance. Hematology Not  Present- Abnormal Bleeding, Blood Clots and Blood Thinners.  Vitals (Kathleen Velasquez RMA; 07/09/2017 11:13 AM) 07/09/2017 11:13 AM Weight: 257.5 lb Height: 64in Body Surface Area: 2.18 m Body Mass Index: 44.2 kg/m  Temp.: 98.86F  Pulse: 97 (Regular)  BP: 126/84 (Sitting, Left Arm, Standard)       Physical Exam Kathleen Deer(Briseyda Fehr M. Rajohn Henery MD; 07/09/2017 11:30 AM) The physical exam findings are as follows: Note:Constitutional: No acute distress; conversant; no deformities Eyes: Moist conjunctiva; no lid lag; anicteric sclerae; pupils equal round and reactive to light Neck: Trachea midline; no palpable thyromegaly Lungs: Normal respiratory effort; no tactile fremitus CV: Regular rate and rhythm; no palpable thrill; no pitting edema GI: Abdomen soft, nontender, nondistended; no palpable hepatosplenomegaly Skin: LEft axilla with changes consistent with chronic hidradenitis. No palpable abscess or fluctuance. No erythema. Area of involvement ~10x5cm. Right axilla-changes consistent with chronic hidradenitis. No palpable abscess or fluctuance. No erythema. Area of involvement ~10x5cm. MSK: Normal gait; no clubbing/cyanosis Psychiatric: Appropriate affect; alert and oriented 3 Lymphatic: No palpable cervical or axillary lymphadenopathy **A chaperone, Kathleen Velasquez, was present for the entire physical exam    Assessment & Plan Kathleen Deer(Marshal Schrecengost M. Necie Wilcoxson MD; 07/09/2017 11:30 AM) HIDRADENITIS (L73.2) Story: Ms. Waldo LaineWare is a pleasant 27yoF with hx ADHD here today with hidradenitis of both axilla Impression: -We discussed the anatomy and physiology of the skin and pathophysiology of hidradenitis with associated pictures and handouts. We discussed the treatment approach and options available issue like to pursue further management including excision of the diseased areas. She has requested to pursue surgical excision. She has requested that we do one side of the time and start with the  left. -We'll plan excision of hidradenitis of the left axilla. The planned procedure, material risks (including, but not limited to, chronic pain, bleeding, infection, damage to surrounding structures including nerves and blood vessels, need for additional procedures, recurrence of hidradenitis, chronic wounds and needing skin grafts, anesthetic complications including pneumonia/heart attack/death). We discussed that given her smoking history which I have advised she stop, our plan would be to leave the wound open and allowed to heal by secondary intention. We discussed that this would involve a couple of months at least of open wounds which required daily or twice daily dressing changes. All of her questions are answered to her satisfaction, she voiced understanding, and elected to proceed  Signed electronically by Andria Meusehristopher M Tryniti Laatsch, MD (07/09/2017 11:30 AM)

## 2017-08-04 ENCOUNTER — Encounter (HOSPITAL_COMMUNITY): Payer: Self-pay | Admitting: *Deleted

## 2017-08-04 ENCOUNTER — Emergency Department (HOSPITAL_COMMUNITY)
Admission: EM | Admit: 2017-08-04 | Discharge: 2017-08-05 | Disposition: A | Discharge status: Home / Self Care | Payer: BLUE CROSS/BLUE SHIELD | Attending: Emergency Medicine | Admitting: Emergency Medicine

## 2017-08-04 ENCOUNTER — Other Ambulatory Visit: Payer: Self-pay

## 2017-08-04 DIAGNOSIS — Z79899 Other long term (current) drug therapy: Secondary | ICD-10-CM | POA: Diagnosis not present

## 2017-08-04 DIAGNOSIS — M79602 Pain in left arm: Secondary | ICD-10-CM | POA: Diagnosis not present

## 2017-08-04 DIAGNOSIS — F17210 Nicotine dependence, cigarettes, uncomplicated: Secondary | ICD-10-CM | POA: Diagnosis not present

## 2017-08-04 DIAGNOSIS — L732 Hidradenitis suppurativa: Secondary | ICD-10-CM | POA: Diagnosis not present

## 2017-08-04 DIAGNOSIS — E039 Hypothyroidism, unspecified: Secondary | ICD-10-CM | POA: Insufficient documentation

## 2017-08-04 MED ORDER — VANCOMYCIN HCL IN DEXTROSE 1-5 GM/200ML-% IV SOLN
1000.0000 mg | Freq: Once | INTRAVENOUS | Status: AC
  Administered 2017-08-05: 1000 mg via INTRAVENOUS
  Filled 2017-08-04: qty 200

## 2017-08-04 NOTE — ED Triage Notes (Addendum)
Pt states that she is currently getting treated for a skin condition called hydradenitis,   Has been on doxycyline 100 mg bid for the past week, before that pt was on keflex for two weeks, pt reports that she has had no improvement in the redness of left axilla area, spoke with her surgeon today who advised that she be seen in the er if she was not getting any better,

## 2017-08-04 NOTE — ED Notes (Signed)
Received report on pt, update given,  
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
## 2017-08-05 MED ORDER — DIPHENHYDRAMINE HCL 50 MG/ML IJ SOLN
50.0000 mg | Freq: Once | INTRAMUSCULAR | Status: AC
  Administered 2017-08-05: 50 mg via INTRAVENOUS
  Filled 2017-08-05: qty 1

## 2017-08-05 NOTE — ED Notes (Signed)
Pt called Rn into room, c/o burning to face and itching to hair, red marks noted to neck and face area from pt scratching, Dr Lynelle DoctorKnapp notified, additional orders given,

## 2017-08-05 NOTE — ED Notes (Signed)
ED Provider at bedside. 

## 2017-08-05 NOTE — Discharge Instructions (Addendum)
Please call Dr Scharlene GlossHall's office in the morning to discuss getting IV antibiotics as an outpatient. We gave you vancomycin in the ED which made you have itching. Follow up with Dr Cliffton AstersWhite about your surgery.  Return to the ED if you have a high fever, vomiting, or you get increasing swelling and redness in your armpit.

## 2017-08-11 DIAGNOSIS — L03112 Cellulitis of left axilla: Secondary | ICD-10-CM | POA: Diagnosis not present

## 2017-08-11 DIAGNOSIS — L732 Hidradenitis suppurativa: Secondary | ICD-10-CM | POA: Diagnosis not present

## 2017-08-19 ENCOUNTER — Encounter (HOSPITAL_COMMUNITY): Payer: Self-pay | Admitting: Psychiatry

## 2017-08-19 ENCOUNTER — Ambulatory Visit (HOSPITAL_COMMUNITY): Payer: BLUE CROSS/BLUE SHIELD | Admitting: Psychiatry

## 2017-08-19 VITALS — BP 115/72 | HR 70 | Ht 64.0 in | Wt 257.0 lb

## 2017-08-19 DIAGNOSIS — F900 Attention-deficit hyperactivity disorder, predominantly inattentive type: Secondary | ICD-10-CM | POA: Diagnosis not present

## 2017-08-19 DIAGNOSIS — F411 Generalized anxiety disorder: Secondary | ICD-10-CM

## 2017-08-19 MED ORDER — AMPHETAMINE-DEXTROAMPHETAMINE 20 MG PO TABS: 20.0000 mg | ORAL_TABLET | Freq: Two times a day (BID) | ORAL | 0 refills | Status: DC

## 2017-08-19 MED ORDER — CLONAZEPAM 1 MG PO TABS: 1.0000 mg | ORAL_TABLET | Freq: Three times a day (TID) | ORAL | 2 refills | Status: DC | PRN

## 2017-08-19 MED ORDER — ESCITALOPRAM OXALATE 20 MG PO TABS: 20.0000 mg | ORAL_TABLET | Freq: Every day | ORAL | 2 refills | Status: DC

## 2017-08-19 NOTE — Progress Notes (Signed)
[facility] MD/PA/NP OP Progress Note  08/19/2017 11:14 AM Kathleen Velasquez  MRN:  [id number]  Chief Complaint:  Chief Complaint    Depression; Anxiety; ADHD; Follow-up     HPI: This patient is a 27 year old single white female who lives with a roommate.  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  The patient returns after 3 months for follow-up regarding depression anxiety and ADHD.  She continues to do well.  She likes her job at the nursing home in [place].  Her mood is good and she is sleeping well and focus is good as well.  She really does not have any specific complaints today.  She does have hidradenitis in her left axilla which is not responding well to oral antibiotics and she may need surgery. Visit Diagnosis:    ICD-10-CM   1. Generalized anxiety disorder F41.1   2. Attention deficit hyperactivity disorder (ADHD), predominantly inattentive type F90.0     Past Psychiatric History:none   Past Medical History:  Past Medical History:  Diagnosis Date  . Abscess    right buttocks/thigh  . DUB (dysfunctional uterine bleeding) 05/25/2012  . Hydradenitis   . Polycystic ovaries   . Thyroid disease    hypothyroidism    Past Surgical History:  Procedure Laterality Date  . ADENOIDECTOMY    . CHOLECYSTECTOMY  01/27/2012   Procedure: LAPAROSCOPIC CHOLECYSTECTOMY;  Surgeon: Fabio Bering, MD;  Location: [facility];  Service: General;  Laterality: N/A;  . HERNIA REPAIR    . HYDRADENITIS EXCISION  10/02/2011   Procedure: EXCISION HYDRADENITIS AXILLA;  Surgeon: Fabio Bering, MD;  Location: [facility];  Service: General;  Laterality: Right;  Right Axillary Dissection  . HYDRADENITIS EXCISION Left 10/19/2013   Procedure: INCISION OF HIDRADENITIS SUPPURATIVA LEFT AXILLA;  Surgeon: Marlane Hatcher, MD;  Location: [facility];  Service: General;  Laterality: Left;  . TONSILLECTOMY      Family Psychiatric History: See below  Family History:  Family History  Problem Relation Age of Onset  .  Diabetes Mother   . Hypertension Mother   . Hyperlipidemia Mother   . Neuropathy Mother   . Diabetes Maternal Grandmother   . Hypertension Maternal Grandmother   . Heart disease Maternal Grandmother        CHF  . COPD Maternal Grandmother   . Cirrhosis Maternal Grandmother   . Anxiety disorder Maternal Grandmother   . Cancer Maternal Grandfather        lung  . Diabetes Paternal Grandmother   . Diabetes Paternal Grandfather   . Aneurysm Paternal Grandfather   . Anxiety disorder Paternal Grandfather   . Drug abuse Father   . ADD / ADHD Brother   . Stroke Other   . Anxiety disorder Maternal Uncle   . Anxiety disorder Paternal Uncle   . Depression Paternal Uncle   . Anxiety disorder Maternal Aunt     Social History:  Social History   Socioeconomic History  . Marital status: Single    Spouse name: Not on file  . Number of children: Not on file  . Years of education: Not on file  . Highest education level: Not on file  Occupational History  . Not on file  Social Needs  . Financial resource strain: Not on file  . Food insecurity:    Worry: Not on file    Inability: Not on file  . Transportation needs:    Medical: Not on file    Non-medical: Not on  file  Tobacco Use  . Smoking status: Current Every Day Smoker    Packs/day: 0.50    Years: 7.00    Pack years: 3.50    Types: Cigarettes  . Smokeless tobacco: Never Used  Substance and Sexual Activity  . Alcohol use: No    Comment: social use . 01-19-14 per pt no  . Drug use: No  . Sexual activity: Yes    Birth control/protection: None  Lifestyle  . Physical activity:    Days per week: Not on file    Minutes per session: Not on file  . Stress: Not on file  Relationships  . Social connections:    Talks on phone: Not on file    Gets together: Not on file    Attends religious service: Not on file    Active member of club or organization: Not on file    Attends meetings of clubs or organizations: Not on file     Relationship status: Not on file  Other Topics Concern  . Not on file  Social History Narrative  . Not on file    Allergies:  Allergies  Allergen Reactions  . Bactrim Hives and Itching  . Ciprofloxacin Hives and Itching    CRNA Discontinued preop antibiotic  IVPB  Cipro in OR after developed itching and hives left arm.  Received Benadryl with relief noted. Dr.Gonzalez and Dr. Leticia Penna informed. DDallasRN  . Other Other (See Comments)    [place] causes a hives, swelling, itching, and anaphylaxis.   Marland Kitchen Penicillins Hives and Itching    Has patient had a PCN reaction causing immediate rash, facial/tongue/throat swelling, SOB or lightheadedness with hypotension: Yes Has patient had a PCN reaction causing severe rash involving mucus membranes or skin necrosis: No Has patient had a PCN reaction that required hospitalization No Has patient had a PCN reaction occurring within the last 10 years: No If all of the above answers are "NO", then may proceed with Cephalosporin use.   Marland Kitchen Percocet [Oxycodone-Acetaminophen] Other (See Comments)    Face flushes  . Vancomycin     Itching,     Metabolic Disorder Labs: Lab Results  Component Value Date   HGBA1C 5.7 (H) 05/25/2012   MPG 117 (H) 05/25/2012   No results found for: PROLACTIN No results found for: CHOL, TRIG, HDL, CHOLHDL, VLDL, LDLCALC Lab Results  Component Value Date   TSH 2.515 05/25/2012    Therapeutic Level Labs: No results found for: LITHIUM No results found for: VALPROATE No components found for:  CBMZ  Current Medications: Current Outpatient Medications  Medication Sig Dispense Refill  . acetaminophen (TYLENOL) 500 MG tablet Take 1,000 mg by mouth every 6 (six) hours as needed for moderate pain.    Marland Kitchen amphetamine-dextroamphetamine (ADDERALL) 20 MG tablet Take 1 tablet (20 mg total) by mouth 2 (two) times daily. 60 tablet 0  . amphetamine-dextroamphetamine (ADDERALL) 20 MG tablet Take 1 tablet (20 mg total) by mouth 2 (two)  times daily. 60 tablet 0  . amphetamine-dextroamphetamine (ADDERALL) 20 MG tablet Take 1 tablet (20 mg total) by mouth 2 (two) times daily. 60 tablet 0  . clonazePAM (KLONOPIN) 1 MG tablet Take 1 tablet (1 mg total) by mouth 3 (three) times daily as needed for anxiety. 90 tablet 2  . escitalopram (LEXAPRO) 20 MG tablet Take 1 tablet (20 mg total) by mouth daily. 90 tablet 2  . NUVARING 0.12-0.015 MG/24HR vaginal ring INSERT VAGINALLY AND LEAVE IN. PLACE FOR 3 CONSECUTIVE WEEKS, THEN REMOVE  FOR 1 WEEK 1 each 10   No current facility-administered medications for this visit.      Musculoskeletal: Strength & Muscle Tone: within normal limits Gait & Station: normal Patient leans: N/A  Psychiatric Specialty Exam: Review of Systems  Skin:       hidradenitis    Blood pressure 115/72, pulse 70, height 5\' 4"  (1.626 m), weight 257 lb (116.6 kg), SpO2 96 %.Body mass index is 44.11 kg/m.  General Appearance: Casual and Fairly Groomed  Eye Contact:  Good  Speech:  Blocked  Volume:  Normal  Mood:  Euthymic  Affect:  Congruent  Thought Process:  Goal Directed  Orientation:  Full (Time, Place, and Person)  Thought Content: WDL   Suicidal Thoughts:  No  Homicidal Thoughts:  No  Memory:  Immediate;   Good Recent;   Good Remote;   Good  Judgement:  Good  Insight:  Fair  Psychomotor Activity:  Normal  Concentration:  Concentration: Good and Attention Span: Good  Recall:  Good  Fund of Knowledge: Good  Language: Good  Akathisia:  No  Handed:  Right  AIMS (if indicated): not done  Assets:  Communication Skills Desire for Improvement Physical Health Resilience Social Support Talents/Skills  ADL's:  Intact  Cognition: WNL  Sleep:  Good   Screenings:   Assessment and Plan: This patient is a 27 year old female with a history of anxiety depression and ADHD.  She is doing well on her current regimen.  She will continue Lexapro 20 mg daily for depression, clonazepam 1 mg 3 times daily as  needed for anxiety and Adderall 20 mg twice daily for ADHD.  She will return to see me in 3 months   Diannia Rudereborah Ross, MD 08/19/2017, 11:14 AM

## 2017-08-23 DIAGNOSIS — L732 Hidradenitis suppurativa: Secondary | ICD-10-CM | POA: Diagnosis not present

## 2017-09-03 DIAGNOSIS — Z6841 Body Mass Index (BMI) 40.0 and over, adult: Secondary | ICD-10-CM | POA: Diagnosis not present

## 2017-09-03 DIAGNOSIS — L03112 Cellulitis of left axilla: Secondary | ICD-10-CM | POA: Diagnosis not present

## 2017-09-03 DIAGNOSIS — L732 Hidradenitis suppurativa: Secondary | ICD-10-CM | POA: Diagnosis not present

## 2017-09-07 ENCOUNTER — Emergency Department (HOSPITAL_COMMUNITY)
Admission: EM | Admit: 2017-09-07 | Discharge: 2017-09-07 | Disposition: A | Discharge status: Home / Self Care | Payer: BLUE CROSS/BLUE SHIELD | Attending: Emergency Medicine | Admitting: Emergency Medicine

## 2017-09-07 ENCOUNTER — Encounter (HOSPITAL_COMMUNITY): Payer: Self-pay | Admitting: Emergency Medicine

## 2017-09-07 ENCOUNTER — Emergency Department (HOSPITAL_COMMUNITY): Payer: BLUE CROSS/BLUE SHIELD

## 2017-09-07 ENCOUNTER — Other Ambulatory Visit: Payer: Self-pay

## 2017-09-07 DIAGNOSIS — Z79899 Other long term (current) drug therapy: Secondary | ICD-10-CM | POA: Diagnosis not present

## 2017-09-07 DIAGNOSIS — F17210 Nicotine dependence, cigarettes, uncomplicated: Secondary | ICD-10-CM | POA: Insufficient documentation

## 2017-09-07 DIAGNOSIS — R319 Hematuria, unspecified: Secondary | ICD-10-CM | POA: Diagnosis not present

## 2017-09-07 DIAGNOSIS — R079 Chest pain, unspecified: Secondary | ICD-10-CM | POA: Diagnosis not present

## 2017-09-07 DIAGNOSIS — E039 Hypothyroidism, unspecified: Secondary | ICD-10-CM | POA: Insufficient documentation

## 2017-09-07 DIAGNOSIS — R1011 Right upper quadrant pain: Secondary | ICD-10-CM | POA: Insufficient documentation

## 2017-09-07 DIAGNOSIS — R109 Unspecified abdominal pain: Secondary | ICD-10-CM | POA: Diagnosis not present

## 2017-09-07 LAB — URINALYSIS, ROUTINE W REFLEX MICROSCOPIC
BILIRUBIN URINE: NEGATIVE
Glucose, UA: NEGATIVE mg/dL
KETONES UR: NEGATIVE mg/dL
Nitrite: NEGATIVE
PH: 6 (ref 5.0–8.0)
Protein, ur: NEGATIVE mg/dL
Specific Gravity, Urine: 1.018 (ref 1.005–1.030)

## 2017-09-07 LAB — COMPREHENSIVE METABOLIC PANEL
ALT: 15 U/L (ref 0–44)
AST: 19 U/L (ref 15–41)
Albumin: 3.6 g/dL (ref 3.5–5.0)
Alkaline Phosphatase: 71 U/L (ref 38–126)
Anion gap: 8 (ref 5–15)
BUN: 10 mg/dL (ref 6–20)
CHLORIDE: 105 mmol/L (ref 98–111)
CO2: 23 mmol/L (ref 22–32)
Calcium: 8.8 mg/dL — ABNORMAL LOW (ref 8.9–10.3)
Creatinine, Ser: 0.6 mg/dL (ref 0.44–1.00)
GFR calc Af Amer: >60 mL/min (ref >60)
GFR calc non Af Amer: >60 mL/min (ref >60)
GLUCOSE: 117 mg/dL — AB (ref 70–99)
Potassium: 3.6 mmol/L (ref 3.5–5.1)
SODIUM: 136 mmol/L (ref 135–145)
Total Bilirubin: 0.2 mg/dL — ABNORMAL LOW (ref 0.3–1.2)
Total Protein: 7.2 g/dL (ref 6.5–8.1)

## 2017-09-07 LAB — D-DIMER, QUANTITATIVE (NOT AT ARMC): D DIMER QUANT: 0.27 ug{FEU}/mL (ref 0.00–0.50)

## 2017-09-07 LAB — CBC WITH DIFFERENTIAL/PLATELET
BASOS PCT: 0 %
Basophils Absolute: 0 10*3/uL (ref 0.0–0.1)
EOS ABS: 0.4 10*3/uL (ref 0.0–0.7)
Eosinophils Relative: 3 %
HCT: 42 % (ref 36.0–46.0)
Hemoglobin: 13.9 g/dL (ref 12.0–15.0)
LYMPHS ABS: 6.4 10*3/uL (ref 0.7–4.0)
Lymphocytes Relative: 46 %
MCH: 30.6 pg (ref 26.0–34.0)
MCHC: 33.1 g/dL (ref 30.0–36.0)
MCV: 92.5 fL (ref 78.0–100.0)
Monocytes Absolute: 0.8 10*3/uL (ref 0.1–1.0)
Monocytes Relative: 6 %
NEUTROS ABS: 6.2 10*3/uL (ref 1.7–7.7)
NEUTROS PCT: 45 %
PLATELETS: 319 10*3/uL (ref 150–400)
RBC: 4.54 MIL/uL (ref 3.87–5.11)
RDW: 13.2 % (ref 11.5–15.5)
WBC: 13.9 10*3/uL — AB (ref 4.0–10.5)

## 2017-09-07 LAB — LIPASE, BLOOD: Lipase: 32 U/L (ref 11–51)

## 2017-09-07 LAB — PREGNANCY, URINE: Preg Test, Ur: NEGATIVE

## 2017-09-07 MED ORDER — ONDANSETRON 4 MG PO TBDP
4.0000 mg | ORAL_TABLET | Freq: Once | ORAL | Status: AC
  Administered 2017-09-07: 4 mg via ORAL
  Filled 2017-09-07: qty 1

## 2017-09-07 MED ORDER — HYDROCODONE-ACETAMINOPHEN 5-325 MG PO TABS
1.0000 | ORAL_TABLET | Freq: Once | ORAL | Status: AC
  Administered 2017-09-07: 1 via ORAL
  Filled 2017-09-07: qty 1

## 2017-09-07 MED ORDER — OMEPRAZOLE 20 MG PO CPDR: 20.0000 mg | DELAYED_RELEASE_CAPSULE | Freq: Every day | ORAL | 0 refills | Status: DC

## 2017-09-07 NOTE — ED Provider Notes (Signed)
[facility]Y [facility]PART[facility] Provider Note   CSN: [id number] Arrival date & time: 09/07/17  0244     History   Chief Complaint Chief Complaint  Patient presents with  . Abdominal Pain    HPI Gaetano HawthorneBrittany R Koslow is a 27 y.o. female.  Patient with right-sided mid back pain that radiates across her ribs to her abdomen.  His pain is been constant since about 11 PM when she was trying to get ready for bed.  She reports this pain reminds her of when she needed her gallbladder taken out in 2014.  She is had nausea but no vomiting.  No diarrhea or fever.  She has some urgency and frequency.  Denies any blood in the urine.  Denies any vaginal bleeding or discharge.  The pain is somewhat worse with deep breathing.  She denies any cough, chest pain or shortness of breath.  She is not taking any at home for the pain.  The history is provided by the patient.  Abdominal Pain   Associated symptoms include nausea. Pertinent negatives include fever, vomiting, dysuria, hematuria, headaches, arthralgias and myalgias.    Past Medical History:  Diagnosis Date  . Abscess    right buttocks/thigh  . DUB (dysfunctional uterine bleeding) 05/25/2012  . Hydradenitis   . Polycystic ovaries   . Thyroid disease    hypothyroidism    Patient Active Problem List   Diagnosis Date Noted  . Anovulatory (dysfunctional uterine) bleeding 06/07/2012  . DUB (dysfunctional uterine bleeding) 05/25/2012  . FOOT PAIN, CHRONIC 05/09/2007    Past Surgical History:  Procedure Laterality Date  . ADENOIDECTOMY    . CHOLECYSTECTOMY  01/27/2012   Procedure: LAPAROSCOPIC CHOLECYSTECTOMY;  Surgeon: Fabio BeringBrent C Ziegler, MD;  Location: [facility];  Service: General;  Laterality: N/A;  . HERNIA REPAIR    . HYDRADENITIS EXCISION  10/02/2011   Procedure: EXCISION HYDRADENITIS AXILLA;  Surgeon: Fabio BeringBrent C Ziegler, MD;  Location: [facility];  Service: General;  Laterality: Right;  Right Axillary Dissection  . HYDRADENITIS EXCISION Left  10/19/2013   Procedure: INCISION OF HIDRADENITIS SUPPURATIVA LEFT AXILLA;  Surgeon: Marlane HatcherWilliam S Bradford, MD;  Location: [facility];  Service: General;  Laterality: Left;  . TONSILLECTOMY       OB History    Gravida  0   Para      Term      Preterm      AB      Living  0     SAB      TAB      Ectopic      Multiple      Live Births               Home Medications    Prior to Admission medications   Medication Sig Start Date End Date Taking? Authorizing Provider  acetaminophen (TYLENOL) 500 MG tablet Take 1,000 mg by mouth every 6 (six) hours as needed for moderate pain.   Yes [provider]  amphetamine-dextroamphetamine (ADDERALL) 20 MG tablet Take 1 tablet (20 mg total) by mouth 2 (two) times daily. 08/19/17 08/19/18 Yes Myrlene Brokeross, Deborah R, MD  amphetamine-dextroamphetamine (ADDERALL) 20 MG tablet Take 1 tablet (20 mg total) by mouth 2 (two) times daily. 08/19/17 08/19/18 Yes Myrlene Brokeross, Deborah R, MD  amphetamine-dextroamphetamine (ADDERALL) 20 MG tablet Take 1 tablet (20 mg total) by mouth 2 (two) times daily. 08/19/17 08/19/18 Yes Myrlene Brokeross, Deborah R, MD  clonazePAM (KLONOPIN) 1 MG tablet Take 1 tablet (1 mg total) by  mouth 3 (three) times daily as needed for anxiety. 08/19/17 08/19/18 Yes Myrlene Broker, MD  escitalopram (LEXAPRO) 20 MG tablet Take 1 tablet (20 mg total) by mouth daily. 08/19/17 08/19/18 Yes Myrlene Broker, MD  NUVARING 0.12-0.015 MG/24HR vaginal ring INSERT VAGINALLY AND LEAVE IN. PLACE FOR 3 CONSECUTIVE WEEKS, THEN REMOVE FOR 1 WEEK 11/28/15  Yes Constant, Peggy, MD    Family History Family History  Problem Relation Age of Onset  . Diabetes Mother   . Hypertension Mother   . Hyperlipidemia Mother   . Neuropathy Mother   . Diabetes Maternal Grandmother   . Hypertension Maternal Grandmother   . Heart disease Maternal Grandmother        CHF  . COPD Maternal Grandmother   . Cirrhosis Maternal Grandmother   . Anxiety disorder Maternal Grandmother   . Cancer  Maternal Grandfather        lung  . Diabetes Paternal Grandmother   . Diabetes Paternal Grandfather   . Aneurysm Paternal Grandfather   . Anxiety disorder Paternal Grandfather   . Drug abuse Father   . ADD / ADHD Brother   . Stroke Other   . Anxiety disorder Maternal Uncle   . Anxiety disorder Paternal Uncle   . Depression Paternal Uncle   . Anxiety disorder Maternal Aunt     Social History Social History   Tobacco Use  . Smoking status: Current Every Day Smoker    Packs/day: 0.50    Years: 7.00    Pack years: 3.50    Types: Cigarettes  . Smokeless tobacco: Never Used  Substance Use Topics  . Alcohol use: No    Comment: social use . 01-19-14 per pt no  . Drug use: No     Allergies   Bactrim; Ciprofloxacin; Other; Penicillins; Percocet [oxycodone-acetaminophen]; and Vancomycin   Review of Systems Review of Systems  Constitutional: Negative for activity change, appetite change and fever.  HENT: Negative for congestion and rhinorrhea.   Eyes: Negative for visual disturbance.  Respiratory: Negative for cough, chest tightness and shortness of breath.   Gastrointestinal: Positive for abdominal pain and nausea. Negative for vomiting.  Genitourinary: Negative for dysuria, hematuria, vaginal bleeding and vaginal discharge.  Musculoskeletal: Positive for back pain. Negative for arthralgias and myalgias.  Skin: Negative for rash.  Neurological: Negative for dizziness, weakness and headaches.   all other systems are negative except as noted in the HPI and PMH.    Physical Exam Updated Vital Signs BP (!) 151/97   Pulse 98   Temp 98.2 F (36.8 C) (Oral)   Resp 20   Ht 5\' 4"  (1.626 m)   Wt 113.4 kg   SpO2 99%   BMI 42.91 kg/m   Physical Exam  Constitutional: She is oriented to person, place, and time. She appears well-developed and well-nourished. No distress.  HENT:  Head: Normocephalic and atraumatic.  Mouth/Throat: Oropharynx is clear and moist. No oropharyngeal  exudate.  Eyes: Pupils are equal, round, and reactive to light. Conjunctivae and EOM are normal.  Neck: Normal range of motion. Neck supple.  No meningismus.  Cardiovascular: Normal rate, regular rhythm, normal heart sounds and intact distal pulses.  No murmur heard. Pulmonary/Chest: Effort normal and breath sounds normal. No respiratory distress. She exhibits no tenderness.  Abdominal: Soft. There is tenderness. There is no rebound and no guarding.  Pain is underneath right breast along area ribs but ribs are nontender.  There is no rash.  Mild right upper quadrant epigastric tenderness,  no guarding or rebound  Musculoskeletal: Normal range of motion. She exhibits tenderness. She exhibits no edema.  Right paraspinal lumbar tenderness, no CVA tenderness  Neurological: She is alert and oriented to person, place, and time. No cranial nerve deficit. She exhibits normal muscle tone. Coordination normal.  No ataxia on finger to nose bilaterally. No pronator drift. 5/5 strength throughout. CN 2-12 intact.Equal grip strength. Sensation intact.   Skin: Skin is warm.  Psychiatric: She has a normal mood and affect. Her behavior is normal.  Nursing note and vitals reviewed.    ED Treatments / Results  Labs (all labs ordered are listed, but only abnormal results are displayed) Labs Reviewed  CBC WITH DIFFERENTIAL/PLATELET - Abnormal; Notable for the following components:      Result Value   WBC 13.9 (*)    All other components within normal limits  COMPREHENSIVE METABOLIC PANEL - Abnormal; Notable for the following components:   Glucose, Bld 117 (*)    Calcium 8.8 (*)    Total Bilirubin 0.2 (*)    All other components within normal limits  URINALYSIS, ROUTINE W REFLEX MICROSCOPIC - Abnormal; Notable for the following components:   APPearance HAZY (*)    Hgb urine dipstick MODERATE (*)    Leukocytes, UA SMALL (*)    Bacteria, UA RARE (*)    All other components within normal limits    PREGNANCY, URINE  LIPASE, BLOOD  D-DIMER, QUANTITATIVE (NOT AT [facility])    EKG None  Radiology Dg Chest 2 View  Result Date: 09/07/2017 CLINICAL DATA:  Right chest pain EXAM: CHEST - 2 VIEW COMPARISON:  10/14/2016 FINDINGS: The heart size and mediastinal contours are within normal limits. Both lungs are clear. The visualized skeletal structures are unremarkable. IMPRESSION: No active cardiopulmonary disease. Electronically Signed   By: Marlan Palau M.D.   On: 09/07/2017 07:07   Ct Renal Stone Study  Result Date: 09/07/2017 CLINICAL DATA:  Flank pain, rule out kidney stone EXAM: CT ABDOMEN AND PELVIS WITHOUT CONTRAST TECHNIQUE: Multidetector CT imaging of the abdomen and pelvis was performed following the standard protocol without IV contrast. COMPARISON:  CT abdomen pelvis 01/23/2012 FINDINGS: Lower chest: Lung bases clear bilaterally. Small Bochdalek's hernia on the left. Hepatobiliary: Postop cholecystectomy. No biliary dilatation. Normal liver. Pancreas: Negative Spleen: Negative Adrenals/Urinary Tract: Normal kidneys ureter and bladder. No renal stone, mass, or hydronephrosis. Empty urinary bladder. Stomach/Bowel: Negative for bowel obstruction. No bowel mass or edema. Normal appendix. Vascular/Lymphatic: Negative Reproductive: Normal uterus and ovaries.  No free fluid Other: None Musculoskeletal: Negative IMPRESSION: No cause for acute abdominal pain.  No urinary tract calculi. Postop cholecystectomy Normal appendix Electronically Signed   By: Marlan Palau M.D.   On: 09/07/2017 07:31    Procedures Procedures (including critical care time)  Medications Ordered in ED Medications  HYDROcodone-acetaminophen (NORCO/VICODIN) 5-325 MG per tablet 1 tablet (1 tablet Oral Given 09/07/17 0509)  ondansetron (ZOFRAN-ODT) disintegrating tablet 4 mg (4 mg Oral Given 09/07/17 0509)     Initial Impression / Assessment and Plan / ED Course  I have reviewed the triage vital signs and the nursing  notes.  Pertinent labs & imaging results that were available during my care of the patient were reviewed by me and considered in my medical decision making (see chart for details).    Mid abdominal and back pain similar to previous episodes of biliary colic but no longer has gallbladder.  Labs with leukocytosis, atypical lymphocytes. HCG negative.  LFTs and lipase normal. UA  with hematuria.  D-dimer negative, CXR negative. Doubt PE, ACS, aortic dissection.  Mid back and upper abdominal pain similar to previous biliary colic but no longer has gallbladder. Labs reassuring. CT obtained to evaluate for possible retained stone. CT negative. No biliary or kidney stone.  Start PPI. Avoid alcohol, NSAIDs, caffeine, spicy foods. Watch for possible shingles, followup with PCP. Return precautions discussed.   Final Clinical Impressions(s) / ED Diagnoses   Final diagnoses:  Flank pain  Hematuria, unspecified type    ED Discharge Orders    None       Khara Renaud, Jeannett Senior, MD 09/07/17 [phone]

## 2017-09-07 NOTE — ED Triage Notes (Signed)
Pt states she is having pain that radiates from between shoulder blades around to below R breast. Describes pain as feeling just like a previous gallbladder attack but states she no longer has her gallbladder. Pt nauseated but no vomiting.

## 2017-09-07 NOTE — Discharge Instructions (Addendum)
Your work-up today is reassuring.  There is no evidence of urinary tract infection.  Avoid alcohol, caffeine, spicy foods, NSAID medications such as ibuprofen as they all irritate the stomach.  Keep an eye out for possible developing rash in the next several days as sometimes pain precedes rash in the case of shingles.  Follow-up with your primary doctor.  Return to the ED if you develop new or worsening symptoms.

## 2017-09-14 DIAGNOSIS — S80862A Insect bite (nonvenomous), left lower leg, initial encounter: Secondary | ICD-10-CM | POA: Diagnosis not present

## 2017-09-14 DIAGNOSIS — R21 Rash and other nonspecific skin eruption: Secondary | ICD-10-CM | POA: Diagnosis not present

## 2017-09-16 DIAGNOSIS — L732 Hidradenitis suppurativa: Secondary | ICD-10-CM | POA: Diagnosis not present

## 2017-09-20 ENCOUNTER — Telehealth (HOSPITAL_COMMUNITY): Payer: Self-pay | Admitting: *Deleted

## 2017-09-20 ENCOUNTER — Other Ambulatory Visit (HOSPITAL_COMMUNITY): Payer: Self-pay | Admitting: Psychiatry

## 2017-09-20 DIAGNOSIS — L732 Hidradenitis suppurativa: Secondary | ICD-10-CM | POA: Diagnosis not present

## 2017-09-20 MED ORDER — AMPHETAMINE-DEXTROAMPHETAMINE 20 MG PO TABS: 20.0000 mg | ORAL_TABLET | Freq: Two times a day (BID) | ORAL | 0 refills | Status: DC

## 2017-09-20 NOTE — Telephone Encounter (Signed)
Dr Tenny Craw Patient called [facility] has [facility] on back[facility]. She called around & [facility] has in sock. Requesting Rx sent to [facility] updated in system.

## 2017-09-20 NOTE — Telephone Encounter (Signed)
Adderall medication on Backorder will call other Rx to check availability

## 2017-09-20 NOTE — Telephone Encounter (Signed)
sent 

## 2017-10-04 DIAGNOSIS — L732 Hidradenitis suppurativa: Secondary | ICD-10-CM | POA: Diagnosis not present

## 2017-10-28 DIAGNOSIS — M25571 Pain in right ankle and joints of right foot: Secondary | ICD-10-CM | POA: Diagnosis not present

## 2017-10-28 DIAGNOSIS — Z6841 Body Mass Index (BMI) 40.0 and over, adult: Secondary | ICD-10-CM | POA: Diagnosis not present

## 2017-11-22 ENCOUNTER — Encounter (HOSPITAL_COMMUNITY): Payer: Self-pay | Admitting: Psychiatry

## 2017-11-22 ENCOUNTER — Ambulatory Visit (INDEPENDENT_AMBULATORY_CARE_PROVIDER_SITE_OTHER): Payer: BLUE CROSS/BLUE SHIELD | Admitting: Psychiatry

## 2017-11-22 VITALS — BP 142/94 | HR 84 | Ht 64.0 in | Wt 261.0 lb

## 2017-11-22 DIAGNOSIS — F411 Generalized anxiety disorder: Secondary | ICD-10-CM

## 2017-11-22 DIAGNOSIS — F900 Attention-deficit hyperactivity disorder, predominantly inattentive type: Secondary | ICD-10-CM

## 2017-11-22 MED ORDER — AMPHETAMINE-DEXTROAMPHETAMINE 20 MG PO TABS: 20.0000 mg | ORAL_TABLET | Freq: Two times a day (BID) | ORAL | 0 refills | Status: DC

## 2017-11-22 MED ORDER — CLONAZEPAM 1 MG PO TABS: 1.0000 mg | ORAL_TABLET | Freq: Three times a day (TID) | ORAL | 2 refills | Status: DC | PRN

## 2017-11-22 MED ORDER — ESCITALOPRAM OXALATE 20 MG PO TABS: 20.0000 mg | ORAL_TABLET | Freq: Every day | ORAL | 2 refills | Status: DC

## 2017-11-22 NOTE — Progress Notes (Signed)
BH MD/PA/NP OP Progress Note  11/22/2017 11:10 AM Kathleen Velasquez  MRN:  [id number]  Chief Complaint:  Chief Complaint    Depression; Anxiety; Follow-up     HPI: This patient is a 27 year old single white female who lives alone.  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  The patient returns for follow-up after 3 months for treatment of depression anxiety and ADHD.  For the most part she is doing well.  Her roommate got pregnant and moved out so she has to shoulder all the bills on her own.  She is making it but just barely.  She is trying to find another responsible person to be a roommate.  She still caring for her mother who has heart disease.  And also trying to help her financially.  For the most part however she is doing well.  Her mood is been good her anxiety is under good control she is sleeping well.  She is staying focused with the Adderall.  She is now on Humira for her hydradenitis and its working well Visit Diagnosis:    ICD-10-CM   1. Generalized anxiety disorder F41.1   2. Attention deficit hyperactivity disorder (ADHD), predominantly inattentive type F90.0     Past Psychiatric History: None  Past Medical History:  Past Medical History:  Diagnosis Date  . Abscess    right buttocks/thigh  . DUB (dysfunctional uterine bleeding) 05/25/2012  . Hydradenitis   . Polycystic ovaries   . Thyroid disease    hypothyroidism    Past Surgical History:  Procedure Laterality Date  . ADENOIDECTOMY    . CHOLECYSTECTOMY  01/27/2012   Procedure: LAPAROSCOPIC CHOLECYSTECTOMY;  Surgeon: Fabio Bering, MD;  Location: [facility];  Service: General;  Laterality: N/A;  . HERNIA REPAIR    . HYDRADENITIS EXCISION  10/02/2011   Procedure: EXCISION HYDRADENITIS AXILLA;  Surgeon: Fabio Bering, MD;  Location: [facility];  Service: General;  Laterality: Right;  Right Axillary Dissection  . HYDRADENITIS EXCISION Left 10/19/2013   Procedure: INCISION OF HIDRADENITIS SUPPURATIVA LEFT AXILLA;  Surgeon:  Marlane Hatcher, MD;  Location: [facility];  Service: General;  Laterality: Left;  . TONSILLECTOMY      Family Psychiatric History: See below  Family History:  Family History  Problem Relation Age of Onset  . Diabetes Mother   . Hypertension Mother   . Hyperlipidemia Mother   . Neuropathy Mother   . Diabetes Maternal Grandmother   . Hypertension Maternal Grandmother   . Heart disease Maternal Grandmother        CHF  . COPD Maternal Grandmother   . Cirrhosis Maternal Grandmother   . Anxiety disorder Maternal Grandmother   . Cancer Maternal Grandfather        lung  . Diabetes Paternal Grandmother   . Diabetes Paternal Grandfather   . Aneurysm Paternal Grandfather   . Anxiety disorder Paternal Grandfather   . Drug abuse Father   . ADD / ADHD Brother   . Stroke Other   . Anxiety disorder Maternal Uncle   . Anxiety disorder Paternal Uncle   . Depression Paternal Uncle   . Anxiety disorder Maternal Aunt     Social History:  Social History   Socioeconomic History  . Marital status: Single    Spouse name: Not on file  . Number of children: Not on file  . Years of education: Not on file  . Highest education level: Not on file  Occupational History  .  Not on file  Social Needs  . Financial resource strain: Not on file  . Food insecurity:    Worry: Not on file    Inability: Not on file  . Transportation needs:    Medical: Not on file    Non-medical: Not on file  Tobacco Use  . Smoking status: Current Every Day Smoker    Packs/day: 0.50    Years: 7.00    Pack years: 3.50    Types: Cigarettes  . Smokeless tobacco: Never Used  Substance and Sexual Activity  . Alcohol use: No    Comment: social use . 01-19-14 per pt no  . Drug use: No  . Sexual activity: Yes    Birth control/protection: None  Lifestyle  . Physical activity:    Days per week: Not on file    Minutes per session: Not on file  . Stress: Not on file  Relationships  . Social connections:    Talks on  phone: Not on file    Gets together: Not on file    Attends religious service: Not on file    Active member of club or organization: Not on file    Attends meetings of clubs or organizations: Not on file    Relationship status: Not on file  Other Topics Concern  . Not on file  Social History Narrative  . Not on file    Allergies:  Allergies  Allergen Reactions  . Bactrim Hives and Itching  . Ciprofloxacin Hives and Itching    CRNA Discontinued preop antibiotic  IVPB  Cipro in OR after developed itching and hives left arm.  Received Benadryl with relief noted. Dr.Gonzalez and Dr. Leticia Penna informed. DDallasRN  . Other Other (See Comments)    [place] causes a hives, swelling, itching, and anaphylaxis.   Marland Kitchen Penicillins Hives and Itching    Has patient had a PCN reaction causing immediate rash, facial/tongue/throat swelling, SOB or lightheadedness with hypotension: Yes Has patient had a PCN reaction causing severe rash involving mucus membranes or skin necrosis: No Has patient had a PCN reaction that required hospitalization No Has patient had a PCN reaction occurring within the last 10 years: No If all of the above answers are "NO", then may proceed with Cephalosporin use.   Marland Kitchen Percocet [Oxycodone-Acetaminophen] Other (See Comments)    Face flushes  . Vancomycin     Itching,     Metabolic Disorder Labs: Lab Results  Component Value Date   HGBA1C 5.7 (H) 05/25/2012   MPG 117 (H) 05/25/2012   No results found for: PROLACTIN No results found for: CHOL, TRIG, HDL, CHOLHDL, VLDL, LDLCALC Lab Results  Component Value Date   TSH 2.515 05/25/2012    Therapeutic Level Labs: No results found for: LITHIUM No results found for: VALPROATE No components found for:  CBMZ  Current Medications: Current Outpatient Medications  Medication Sig Dispense Refill  . acetaminophen (TYLENOL) 500 MG tablet Take 1,000 mg by mouth every 6 (six) hours as needed for moderate pain.    Marland Kitchen  amphetamine-dextroamphetamine (ADDERALL) 20 MG tablet Take 1 tablet (20 mg total) by mouth 2 (two) times daily. 60 tablet 0  . amphetamine-dextroamphetamine (ADDERALL) 20 MG tablet Take 1 tablet (20 mg total) by mouth 2 (two) times daily. 60 tablet 0  . amphetamine-dextroamphetamine (ADDERALL) 20 MG tablet Take 1 tablet (20 mg total) by mouth 2 (two) times daily. 60 tablet 0  . clonazePAM (KLONOPIN) 1 MG tablet Take 1 tablet (1 mg total) by mouth  3 (three) times daily as needed for anxiety. 90 tablet 2  . escitalopram (LEXAPRO) 20 MG tablet Take 1 tablet (20 mg total) by mouth daily. 90 tablet 2  . NUVARING 0.12-0.015 MG/24HR vaginal ring INSERT VAGINALLY AND LEAVE IN. PLACE FOR 3 CONSECUTIVE WEEKS, THEN REMOVE FOR 1 WEEK 1 each 10  . omeprazole (PRILOSEC) 20 MG capsule Take 1 capsule (20 mg total) by mouth daily. 30 capsule 0   No current facility-administered medications for this visit.      Musculoskeletal: Strength & Muscle Tone: within normal limits Gait & Station: normal Patient leans: N/A  Psychiatric Specialty Exam: Review of Systems  All other systems reviewed and are negative.   Blood pressure (!) 142/94, pulse 84, height 5\' 4"  (1.626 m), weight 261 lb (118.4 kg), SpO2 96 %.Body mass index is 44.8 kg/m.  General Appearance: Casual and Fairly Groomed  Eye Contact:  Good  Speech:  Clear and Coherent  Volume:  Normal  Mood:  Euthymic  Affect:  Congruent  Thought Process:  Goal Directed  Orientation:  Full (Time, Place, and Person)  Thought Content: WDL   Suicidal Thoughts:  No  Homicidal Thoughts:  No  Memory:  Immediate;   Good Recent;   Good Remote;   Good  Judgement:  Good  Insight:  Fair  Psychomotor Activity:  Normal  Concentration:  Concentration: Good and Attention Span: Good  Recall:  Good  Fund of Knowledge: Good  Language: Good  Akathisia:  No  Handed:  Right  AIMS (if indicated): not done  Assets:  Communication Skills Desire for Improvement Physical  Health Resilience Social Support Talents/Skills  ADL's:  Intact  Cognition: WNL  Sleep:  Good   Screenings:   Assessment and Plan: This patient is a 27 year old female with a history of depression anxiety and ADHD.  She is doing well on her current regimen.  She will continue Lexapro 20 mg daily for depression, clonazepam 1 mg 3 times daily as needed for anxiety and Adderall 20 mg twice daily for focus.  She will return to see me in 3 months   [name], MD 11/22/2017, 11:10 AM

## 2018-02-17 DIAGNOSIS — E6609 Other obesity due to excess calories: Secondary | ICD-10-CM | POA: Diagnosis not present

## 2018-02-17 DIAGNOSIS — M5430 Sciatica, unspecified side: Secondary | ICD-10-CM | POA: Diagnosis not present

## 2018-02-17 DIAGNOSIS — M25561 Pain in right knee: Secondary | ICD-10-CM | POA: Diagnosis not present

## 2018-02-22 ENCOUNTER — Encounter (HOSPITAL_COMMUNITY): Payer: Self-pay | Admitting: Psychiatry

## 2018-02-22 ENCOUNTER — Ambulatory Visit (INDEPENDENT_AMBULATORY_CARE_PROVIDER_SITE_OTHER): Payer: BLUE CROSS/BLUE SHIELD | Admitting: Psychiatry

## 2018-02-22 VITALS — BP 124/80 | HR 84 | Ht 64.0 in | Wt 262.0 lb

## 2018-02-22 DIAGNOSIS — F411 Generalized anxiety disorder: Secondary | ICD-10-CM | POA: Diagnosis not present

## 2018-02-22 DIAGNOSIS — F900 Attention-deficit hyperactivity disorder, predominantly inattentive type: Secondary | ICD-10-CM

## 2018-02-22 MED ORDER — AMPHETAMINE-DEXTROAMPHETAMINE 20 MG PO TABS: 20.0000 mg | ORAL_TABLET | Freq: Two times a day (BID) | ORAL | 0 refills | Status: DC

## 2018-02-22 MED ORDER — ESCITALOPRAM OXALATE 20 MG PO TABS: 20.0000 mg | ORAL_TABLET | Freq: Every day | ORAL | 2 refills | Status: DC

## 2018-02-22 MED ORDER — CLONAZEPAM 1 MG PO TABS: 1.0000 mg | ORAL_TABLET | Freq: Two times a day (BID) | ORAL | 2 refills | Status: DC | PRN

## 2018-02-22 NOTE — Progress Notes (Signed)
BH MD/PA/NP OP Progress Note  02/22/2018 11:16 AM Kathleen Velasquez  MRN:  [id number]  Chief Complaint:  Chief Complaint    Depression; Anxiety; ADHD     HPI: This patient is a 28 year old single white female who lives alone.  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  The patient returns for follow-up after 3 months.  For the most part she is doing well.  She thinks she has torn some cartilage in her right knee and is going to be seeing an orthopedic surgeon.  She is working a lot of extra shifts which is not helped.  Overall however her mood has been good her anxiety is under good control and she is sleeping well.  She is staying focused with the Adderall.  She is on Humira for hydradenitis but she wonders if it is causing more of the joint pain. Visit Diagnosis:    ICD-10-CM   1. Generalized anxiety disorder F41.1   2. Attention deficit hyperactivity disorder (ADHD), predominantly inattentive type F90.0     Past Psychiatric History: none  Past Medical History:  Past Medical History:  Diagnosis Date  . Abscess    right buttocks/thigh  . DUB (dysfunctional uterine bleeding) 05/25/2012  . Hydradenitis   . Polycystic ovaries   . Thyroid disease    hypothyroidism    Past Surgical History:  Procedure Laterality Date  . ADENOIDECTOMY    . CHOLECYSTECTOMY  01/27/2012   Procedure: LAPAROSCOPIC CHOLECYSTECTOMY;  Surgeon: Fabio Bering, MD;  Location: [facility];  Service: General;  Laterality: N/A;  . HERNIA REPAIR    . HYDRADENITIS EXCISION  10/02/2011   Procedure: EXCISION HYDRADENITIS AXILLA;  Surgeon: Fabio Bering, MD;  Location: [facility];  Service: General;  Laterality: Right;  Right Axillary Dissection  . HYDRADENITIS EXCISION Left 10/19/2013   Procedure: INCISION OF HIDRADENITIS SUPPURATIVA LEFT AXILLA;  Surgeon: Marlane Hatcher, MD;  Location: [facility];  Service: General;  Laterality: Left;  . TONSILLECTOMY      Family Psychiatric History: See below  Family History:  Family  History  Problem Relation Age of Onset  . Diabetes Mother   . Hypertension Mother   . Hyperlipidemia Mother   . Neuropathy Mother   . Diabetes Maternal Grandmother   . Hypertension Maternal Grandmother   . Heart disease Maternal Grandmother        CHF  . COPD Maternal Grandmother   . Cirrhosis Maternal Grandmother   . Anxiety disorder Maternal Grandmother   . Cancer Maternal Grandfather        lung  . Diabetes Paternal Grandmother   . Diabetes Paternal Grandfather   . Aneurysm Paternal Grandfather   . Anxiety disorder Paternal Grandfather   . Drug abuse Father   . ADD / ADHD Brother   . Stroke Other   . Anxiety disorder Maternal Uncle   . Anxiety disorder Paternal Uncle   . Depression Paternal Uncle   . Anxiety disorder Maternal Aunt     Social History:  Social History   Socioeconomic History  . Marital status: Single    Spouse name: Not on file  . Number of children: Not on file  . Years of education: Not on file  . Highest education level: Not on file  Occupational History  . Not on file  Social Needs  . Financial resource strain: Not on file  . Food insecurity:    Worry: Not on file    Inability: Not on file  .  Transportation needs:    Medical: Not on file    Non-medical: Not on file  Tobacco Use  . Smoking status: Current Every Day Smoker    Packs/day: 0.50    Years: 7.00    Pack years: 3.50    Types: Cigarettes  . Smokeless tobacco: Never Used  Substance and Sexual Activity  . Alcohol use: No    Comment: social use . 01-19-14 per pt no  . Drug use: No  . Sexual activity: Yes    Birth control/protection: None  Lifestyle  . Physical activity:    Days per week: Not on file    Minutes per session: Not on file  . Stress: Not on file  Relationships  . Social connections:    Talks on phone: Not on file    Gets together: Not on file    Attends religious service: Not on file    Active member of club or organization: Not on file    Attends meetings of  clubs or organizations: Not on file    Relationship status: Not on file  Other Topics Concern  . Not on file  Social History Narrative  . Not on file    Allergies:  Allergies  Allergen Reactions  . Bactrim Hives and Itching  . Ciprofloxacin Hives and Itching    CRNA Discontinued preop antibiotic  IVPB  Cipro in OR after developed itching and hives left arm.  Received Benadryl with relief noted. Dr.Gonzalez and Dr. Leticia Penna informed. DDallasRN  . Other Other (See Comments)    [place] causes a hives, swelling, itching, and anaphylaxis.   Marland Kitchen Penicillins Hives and Itching    Has patient had a PCN reaction causing immediate rash, facial/tongue/throat swelling, SOB or lightheadedness with hypotension: Yes Has patient had a PCN reaction causing severe rash involving mucus membranes or skin necrosis: No Has patient had a PCN reaction that required hospitalization No Has patient had a PCN reaction occurring within the last 10 years: No If all of the above answers are "NO", then may proceed with Cephalosporin use.   Marland Kitchen Percocet [Oxycodone-Acetaminophen] Other (See Comments)    Face flushes  . Vancomycin     Itching,     Metabolic Disorder Labs: Lab Results  Component Value Date   HGBA1C 5.7 (H) 05/25/2012   MPG 117 (H) 05/25/2012   No results found for: PROLACTIN No results found for: CHOL, TRIG, HDL, CHOLHDL, VLDL, LDLCALC Lab Results  Component Value Date   TSH 2.515 05/25/2012    Therapeutic Level Labs: No results found for: LITHIUM No results found for: VALPROATE No components found for:  CBMZ  Current Medications: Current Outpatient Medications  Medication Sig Dispense Refill  . acetaminophen (TYLENOL) 500 MG tablet Take 1,000 mg by mouth every 6 (six) hours as needed for moderate pain.    Marland Kitchen amphetamine-dextroamphetamine (ADDERALL) 20 MG tablet Take 1 tablet (20 mg total) by mouth 2 (two) times daily. 60 tablet 0  . amphetamine-dextroamphetamine (ADDERALL) 20 MG tablet Take  1 tablet (20 mg total) by mouth 2 (two) times daily. 60 tablet 0  . amphetamine-dextroamphetamine (ADDERALL) 20 MG tablet Take 1 tablet (20 mg total) by mouth 2 (two) times daily. 60 tablet 0  . clonazePAM (KLONOPIN) 1 MG tablet Take 1 tablet (1 mg total) by mouth 2 (two) times daily as needed for anxiety. 60 tablet 2  . escitalopram (LEXAPRO) 20 MG tablet Take 1 tablet (20 mg total) by mouth daily. 90 tablet 2  . NUVARING 0.12-0.015  MG/24HR vaginal ring INSERT VAGINALLY AND LEAVE IN. PLACE FOR 3 CONSECUTIVE WEEKS, THEN REMOVE FOR 1 WEEK 1 each 10  . omeprazole (PRILOSEC) 20 MG capsule Take 1 capsule (20 mg total) by mouth daily. 30 capsule 0   No current facility-administered medications for this visit.      Musculoskeletal: Strength & Muscle Tone: within normal limits Gait & Station: normal Patient leans: N/A  Psychiatric Specialty Exam: Review of Systems  Musculoskeletal: Positive for joint pain.  All other systems reviewed and are negative.   Blood pressure 124/80, pulse 84, height 5\' 4"  (1.626 m), weight 262 lb (118.8 kg), SpO2 96 %.Body mass index is 44.97 kg/m.  General Appearance: Casual and Fairly Groomed  Eye Contact:  Good  Speech:  Clear and Coherent  Volume:  Normal  Mood:  Euthymic  Affect:  Congruent  Thought Process:  Goal Directed  Orientation:  Full (Time, Place, and Person)  Thought Content: WDL   Suicidal Thoughts:  No  Homicidal Thoughts:  No  Memory:  Immediate;   Good Recent;   Good Remote;   Good  Judgement:  Good  Insight:  Fair  Psychomotor Activity:  Normal  Concentration:  Concentration: Good and Attention Span: Good  Recall:  Good  Fund of Knowledge: Good  Language: Good  Akathisia:  No  Handed:  Right  AIMS (if indicated): not done  Assets:  Communication Skills Desire for Improvement Physical Health Resilience Social Support Talents/Skills  ADL's:  Intact  Cognition: WNL  Sleep:  Good   Screenings:   Assessment and Plan: This  patient is a 28 year old female with a history of ADHD depression and anxiety.  She continues to do well on her regimen.  She will continue Lexapro 20 mg daily for depression, clonazepam 1 mg twice daily as needed for anxiety and Adderall 20 mg twice daily for focus.  She will return to see me in 3 months   Diannia Rudereborah Channah Godeaux, MD 02/22/2018, 11:16 AM

## 2018-03-02 ENCOUNTER — Ambulatory Visit (INDEPENDENT_AMBULATORY_CARE_PROVIDER_SITE_OTHER): Payer: BLUE CROSS/BLUE SHIELD

## 2018-03-02 ENCOUNTER — Ambulatory Visit (INDEPENDENT_AMBULATORY_CARE_PROVIDER_SITE_OTHER): Payer: BLUE CROSS/BLUE SHIELD | Admitting: Orthopaedic Surgery

## 2018-03-02 ENCOUNTER — Encounter: Payer: Self-pay | Admitting: Orthopaedic Surgery

## 2018-03-02 VITALS — BP 140/88 | HR 80 | Ht 64.0 in | Wt 265.0 lb

## 2018-03-02 DIAGNOSIS — M25561 Pain in right knee: Secondary | ICD-10-CM

## 2018-03-02 NOTE — Progress Notes (Signed)
Subjective:    Patient ID: Kathleen Velasquez, female    DOB: 22-Aug-1990, 28 y.o.   MRN: [id number]  HPI She has pain in the right knee for about a month.  It is getting worse.  She has popping and swelling.  She has begun to have giving way.  She has seen her family doctor and has been on Mobic.  She uses a knee sleeve.  She has used ice and Tylenol with little help. The giving way bothers her.  She has no trauma, no redness.  She has had some lower back problems with some mild right sided sciatica but she wants to be evaluated only for her knee today.     Review of Systems  Constitutional: Positive for activity change.  Musculoskeletal: Positive for arthralgias, gait problem and joint swelling.  All other systems reviewed and are negative.  For Review of Systems, all other systems reviewed and are negative.  The following is a summary of the past history medically, past history surgically, known current medicines, social history and family history.  This information is gathered electronically by the computer from prior information and documentation.  I review this each visit and have found including this information at this point in the chart is beneficial and informative.   Past Medical History:  Diagnosis Date  . Abscess    right buttocks/thigh  . DUB (dysfunctional uterine bleeding) 05/25/2012  . Hydradenitis   . Polycystic ovaries   . Thyroid disease    hypothyroidism    Past Surgical History:  Procedure Laterality Date  . ADENOIDECTOMY    . CHOLECYSTECTOMY  01/27/2012   Procedure: LAPAROSCOPIC CHOLECYSTECTOMY;  Surgeon: Fabio Bering, MD;  Location: [facility];  Service: General;  Laterality: N/A;  . HERNIA REPAIR    . HYDRADENITIS EXCISION  10/02/2011   Procedure: EXCISION HYDRADENITIS AXILLA;  Surgeon: Fabio Bering, MD;  Location: [facility];  Service: General;  Laterality: Right;  Right Axillary Dissection  . HYDRADENITIS EXCISION Left 10/19/2013   Procedure: INCISION OF  HIDRADENITIS SUPPURATIVA LEFT AXILLA;  Surgeon: Marlane Hatcher, MD;  Location: [facility];  Service: General;  Laterality: Left;  . TONSILLECTOMY      Current Outpatient Medications on File Prior to Visit  Medication Sig Dispense Refill  . acetaminophen (TYLENOL) 500 MG tablet Take 1,000 mg by mouth every 6 (six) hours as needed for moderate pain.    Marland Kitchen amphetamine-dextroamphetamine (ADDERALL) 20 MG tablet Take 1 tablet (20 mg total) by mouth 2 (two) times daily. 60 tablet 0  . amphetamine-dextroamphetamine (ADDERALL) 20 MG tablet Take 1 tablet (20 mg total) by mouth 2 (two) times daily. 60 tablet 0  . amphetamine-dextroamphetamine (ADDERALL) 20 MG tablet Take 1 tablet (20 mg total) by mouth 2 (two) times daily. 60 tablet 0  . clonazePAM (KLONOPIN) 1 MG tablet Take 1 tablet (1 mg total) by mouth 2 (two) times daily as needed for anxiety. 60 tablet 2  . escitalopram (LEXAPRO) 20 MG tablet Take 1 tablet (20 mg total) by mouth daily. 90 tablet 2  . NUVARING 0.12-0.015 MG/24HR vaginal ring INSERT VAGINALLY AND LEAVE IN. PLACE FOR 3 CONSECUTIVE WEEKS, THEN REMOVE FOR 1 WEEK 1 each 10  . omeprazole (PRILOSEC) 20 MG capsule Take 1 capsule (20 mg total) by mouth daily. 30 capsule 0   No current facility-administered medications on file prior to visit.     Social History   Socioeconomic History  . Marital status: Single    Spouse  name: Not on file  . Number of children: Not on file  . Years of education: Not on file  . Highest education level: Not on file  Occupational History  . Not on file  Social Needs  . Financial resource strain: Not on file  . Food insecurity:    Worry: Not on file    Inability: Not on file  . Transportation needs:    Medical: Not on file    Non-medical: Not on file  Tobacco Use  . Smoking status: Current Every Day Smoker    Packs/day: 0.50    Years: 7.00    Pack years: 3.50    Types: Cigarettes  . Smokeless tobacco: Never Used  Substance and Sexual Activity    . Alcohol use: No    Comment: social use . 01-19-14 per pt no  . Drug use: No  . Sexual activity: Yes    Birth control/protection: None  Lifestyle  . Physical activity:    Days per week: Not on file    Minutes per session: Not on file  . Stress: Not on file  Relationships  . Social connections:    Talks on phone: Not on file    Gets together: Not on file    Attends religious service: Not on file    Active member of club or organization: Not on file    Attends meetings of clubs or organizations: Not on file    Relationship status: Not on file  . Intimate partner violence:    Fear of current or ex partner: Not on file    Emotionally abused: Not on file    Physically abused: Not on file    Forced sexual activity: Not on file  Other Topics Concern  . Not on file  Social History Narrative  . Not on file    Family History  Problem Relation Age of Onset  . Diabetes Mother   . Hypertension Mother   . Hyperlipidemia Mother   . Neuropathy Mother   . Diabetes Maternal Grandmother   . Hypertension Maternal Grandmother   . Heart disease Maternal Grandmother        CHF  . COPD Maternal Grandmother   . Cirrhosis Maternal Grandmother   . Anxiety disorder Maternal Grandmother   . Cancer Maternal Grandfather        lung  . Diabetes Paternal Grandmother   . Diabetes Paternal Grandfather   . Aneurysm Paternal Grandfather   . Anxiety disorder Paternal Grandfather   . Drug abuse Father   . ADD / ADHD Brother   . Stroke Other   . Anxiety disorder Maternal Uncle   . Anxiety disorder Paternal Uncle   . Depression Paternal Uncle   . Anxiety disorder Maternal Aunt     BP 140/88   Pulse 80   Ht 5\' 4"  (1.626 m)   Wt 265 lb (120.2 kg)   BMI 45.49 kg/m   Body mass index is 45.49 kg/m.     Objective:   Physical Exam Constitutional:      Appearance: She is well-developed.  HENT:     Head: Normocephalic and atraumatic.  Eyes:     Conjunctiva/sclera: Conjunctivae normal.      Pupils: Pupils are equal, round, and reactive to light.  Neck:     Musculoskeletal: Normal range of motion and neck supple.  Cardiovascular:     Rate and Rhythm: Normal rate and regular rhythm.  Pulmonary:     Effort: Pulmonary effort is normal.  Abdominal:     Palpations: Abdomen is soft.  Musculoskeletal:     Right knee: Tenderness found. Medial joint line tenderness noted.       Legs:  Skin:    General: Skin is warm and dry.  Neurological:     Mental Status: She is alert and oriented to person, place, and time.     Cranial Nerves: No cranial nerve deficit.     Motor: No abnormal muscle tone.     Coordination: Coordination normal.     Deep Tendon Reflexes: Reflexes are normal and symmetric. Reflexes normal.  Psychiatric:        Behavior: Behavior normal.        Thought Content: Thought content normal.        Judgment: Judgment normal.     X-rays were done of the right knee, reported separately.      Assessment & Plan:   Encounter Diagnosis  Name Primary?  . Acute pain of right knee Yes   I feel she has a medial meniscus tear.  I have told her of my diagnosis.  She has to wait six weeks from when first seen for her insurance to pay for the MRI.    I have gone over precautions with her.  She should continue the Mobic and the brace.  Return in three weeks.  Call if any problem.  Precautions discussed.   Electronically Signed Darreld McleanWayne Zenna Traister, MD 2/19/20209:01 AM

## 2018-03-09 ENCOUNTER — Emergency Department (HOSPITAL_COMMUNITY)
Admission: EM | Admit: 2018-03-09 | Discharge: 2018-03-10 | Disposition: A | Discharge status: Home / Self Care | Payer: BLUE CROSS/BLUE SHIELD | Attending: Emergency Medicine | Admitting: Emergency Medicine

## 2018-03-09 ENCOUNTER — Emergency Department (HOSPITAL_COMMUNITY): Payer: BLUE CROSS/BLUE SHIELD

## 2018-03-09 ENCOUNTER — Other Ambulatory Visit: Payer: Self-pay

## 2018-03-09 ENCOUNTER — Encounter (HOSPITAL_COMMUNITY): Payer: Self-pay | Admitting: Emergency Medicine

## 2018-03-09 DIAGNOSIS — Z5321 Procedure and treatment not carried out due to patient leaving prior to being seen by health care provider: Secondary | ICD-10-CM | POA: Diagnosis not present

## 2018-03-09 DIAGNOSIS — Z720 Tobacco use: Secondary | ICD-10-CM | POA: Diagnosis not present

## 2018-03-09 DIAGNOSIS — F17200 Nicotine dependence, unspecified, uncomplicated: Secondary | ICD-10-CM | POA: Diagnosis not present

## 2018-03-09 DIAGNOSIS — R509 Fever, unspecified: Secondary | ICD-10-CM | POA: Insufficient documentation

## 2018-03-09 DIAGNOSIS — J069 Acute upper respiratory infection, unspecified: Secondary | ICD-10-CM | POA: Diagnosis not present

## 2018-03-09 DIAGNOSIS — Z79899 Other long term (current) drug therapy: Secondary | ICD-10-CM | POA: Diagnosis not present

## 2018-03-09 NOTE — ED Triage Notes (Addendum)
Pt c/o nasal congestion, fever, body aches, sore throat, and headache since Friday.

## 2018-03-12 DIAGNOSIS — R05 Cough: Secondary | ICD-10-CM | POA: Diagnosis not present

## 2018-03-12 DIAGNOSIS — J069 Acute upper respiratory infection, unspecified: Secondary | ICD-10-CM | POA: Diagnosis not present

## 2018-03-12 DIAGNOSIS — R030 Elevated blood-pressure reading, without diagnosis of hypertension: Secondary | ICD-10-CM | POA: Diagnosis not present

## 2018-03-12 DIAGNOSIS — Z716 Tobacco abuse counseling: Secondary | ICD-10-CM | POA: Diagnosis not present

## 2018-03-12 DIAGNOSIS — F17218 Nicotine dependence, cigarettes, with other nicotine-induced disorders: Secondary | ICD-10-CM | POA: Diagnosis not present

## 2018-03-23 ENCOUNTER — Ambulatory Visit: Payer: BLUE CROSS/BLUE SHIELD | Admitting: Orthopaedic Surgery

## 2018-03-26 DIAGNOSIS — J209 Acute bronchitis, unspecified: Secondary | ICD-10-CM | POA: Diagnosis not present

## 2018-03-30 ENCOUNTER — Other Ambulatory Visit: Payer: Self-pay

## 2018-03-30 DIAGNOSIS — R6889 Other general symptoms and signs: Secondary | ICD-10-CM

## 2018-04-06 LAB — NOVEL CORONAVIRUS, NAA: SARS-CoV-2, NAA: NOT DETECTED

## 2018-04-15 DIAGNOSIS — E039 Hypothyroidism, unspecified: Secondary | ICD-10-CM | POA: Diagnosis not present

## 2018-04-15 DIAGNOSIS — Z3044 Encounter for surveillance of vaginal ring hormonal contraceptive device: Secondary | ICD-10-CM | POA: Diagnosis not present

## 2018-04-15 DIAGNOSIS — E669 Obesity, unspecified: Secondary | ICD-10-CM | POA: Diagnosis not present

## 2018-04-21 DIAGNOSIS — R05 Cough: Secondary | ICD-10-CM | POA: Diagnosis not present

## 2018-04-21 DIAGNOSIS — J069 Acute upper respiratory infection, unspecified: Secondary | ICD-10-CM | POA: Diagnosis not present

## 2018-05-02 DIAGNOSIS — R079 Chest pain, unspecified: Secondary | ICD-10-CM | POA: Diagnosis not present

## 2018-05-23 ENCOUNTER — Other Ambulatory Visit (HOSPITAL_COMMUNITY): Payer: Self-pay | Admitting: Internal Medicine

## 2018-05-23 ENCOUNTER — Other Ambulatory Visit: Payer: Self-pay

## 2018-05-23 ENCOUNTER — Ambulatory Visit (HOSPITAL_COMMUNITY)
Admission: RE | Admit: 2018-05-23 | Discharge: 2018-05-23 | Disposition: A | Discharge status: Home / Self Care | Payer: BLUE CROSS/BLUE SHIELD | Source: Ambulatory Visit | Attending: Internal Medicine | Admitting: Internal Medicine

## 2018-05-23 DIAGNOSIS — M25511 Pain in right shoulder: Secondary | ICD-10-CM | POA: Diagnosis not present

## 2018-05-24 ENCOUNTER — Other Ambulatory Visit: Payer: Self-pay | Admitting: Internal Medicine

## 2018-05-24 ENCOUNTER — Ambulatory Visit (INDEPENDENT_AMBULATORY_CARE_PROVIDER_SITE_OTHER): Payer: BLUE CROSS/BLUE SHIELD | Admitting: Psychiatry

## 2018-05-24 ENCOUNTER — Encounter (HOSPITAL_COMMUNITY): Payer: Self-pay | Admitting: Psychiatry

## 2018-05-24 DIAGNOSIS — F411 Generalized anxiety disorder: Secondary | ICD-10-CM

## 2018-05-24 DIAGNOSIS — M25511 Pain in right shoulder: Secondary | ICD-10-CM

## 2018-05-24 DIAGNOSIS — M542 Cervicalgia: Secondary | ICD-10-CM

## 2018-05-24 DIAGNOSIS — F900 Attention-deficit hyperactivity disorder, predominantly inattentive type: Secondary | ICD-10-CM

## 2018-05-24 MED ORDER — AMPHETAMINE-DEXTROAMPHETAMINE 20 MG PO TABS: 20.0000 mg | ORAL_TABLET | Freq: Two times a day (BID) | ORAL | 0 refills | Status: DC

## 2018-05-24 MED ORDER — ESCITALOPRAM OXALATE 20 MG PO TABS: 20.0000 mg | ORAL_TABLET | Freq: Every day | ORAL | 2 refills | Status: DC

## 2018-05-24 MED ORDER — CLONAZEPAM 1 MG PO TABS: 1.0000 mg | ORAL_TABLET | Freq: Two times a day (BID) | ORAL | 2 refills | Status: DC | PRN
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
## 2018-06-10 ENCOUNTER — Other Ambulatory Visit: Payer: Self-pay | Admitting: Internal Medicine

## 2018-06-10 DIAGNOSIS — M542 Cervicalgia: Secondary | ICD-10-CM

## 2018-06-20 ENCOUNTER — Ambulatory Visit (HOSPITAL_COMMUNITY)
Admission: RE | Admit: 2018-06-20 | Discharge: 2018-06-20 | Disposition: A | Discharge status: Home / Self Care | Payer: BC Managed Care – PPO | Source: Ambulatory Visit | Attending: Internal Medicine | Admitting: Internal Medicine

## 2018-06-20 ENCOUNTER — Ambulatory Visit (HOSPITAL_COMMUNITY): Payer: BC Managed Care – PPO

## 2018-06-20 ENCOUNTER — Other Ambulatory Visit: Payer: Self-pay

## 2018-06-20 ENCOUNTER — Other Ambulatory Visit (HOSPITAL_COMMUNITY): Payer: BLUE CROSS/BLUE SHIELD

## 2018-06-20 DIAGNOSIS — M25511 Pain in right shoulder: Secondary | ICD-10-CM

## 2018-06-20 DIAGNOSIS — M542 Cervicalgia: Secondary | ICD-10-CM | POA: Insufficient documentation

## 2018-07-06 DIAGNOSIS — M542 Cervicalgia: Secondary | ICD-10-CM | POA: Diagnosis not present

## 2018-07-06 DIAGNOSIS — M25511 Pain in right shoulder: Secondary | ICD-10-CM | POA: Diagnosis not present

## 2018-07-20 DIAGNOSIS — M7541 Impingement syndrome of right shoulder: Secondary | ICD-10-CM | POA: Diagnosis not present

## 2018-07-20 DIAGNOSIS — M25511 Pain in right shoulder: Secondary | ICD-10-CM | POA: Diagnosis not present

## 2018-07-28 DIAGNOSIS — M7541 Impingement syndrome of right shoulder: Secondary | ICD-10-CM | POA: Diagnosis not present

## 2018-08-02 DIAGNOSIS — M7541 Impingement syndrome of right shoulder: Secondary | ICD-10-CM | POA: Diagnosis not present

## 2018-08-04 DIAGNOSIS — M7541 Impingement syndrome of right shoulder: Secondary | ICD-10-CM | POA: Diagnosis not present

## 2018-08-08 DIAGNOSIS — L732 Hidradenitis suppurativa: Secondary | ICD-10-CM | POA: Diagnosis not present

## 2018-08-09 ENCOUNTER — Other Ambulatory Visit: Payer: Self-pay

## 2018-08-09 DIAGNOSIS — E039 Hypothyroidism, unspecified: Secondary | ICD-10-CM | POA: Insufficient documentation

## 2018-08-09 DIAGNOSIS — L732 Hidradenitis suppurativa: Secondary | ICD-10-CM | POA: Diagnosis not present

## 2018-08-09 DIAGNOSIS — F17210 Nicotine dependence, cigarettes, uncomplicated: Secondary | ICD-10-CM | POA: Diagnosis not present

## 2018-08-10 ENCOUNTER — Other Ambulatory Visit: Payer: Self-pay

## 2018-08-10 ENCOUNTER — Encounter (HOSPITAL_COMMUNITY): Payer: Self-pay | Admitting: Emergency Medicine

## 2018-08-10 ENCOUNTER — Emergency Department (HOSPITAL_COMMUNITY)
Admission: EM | Admit: 2018-08-10 | Discharge: 2018-08-10 | Disposition: A | Discharge status: Home / Self Care | Payer: BC Managed Care – PPO | Attending: Emergency Medicine | Admitting: Emergency Medicine

## 2018-08-10 DIAGNOSIS — L732 Hidradenitis suppurativa: Secondary | ICD-10-CM

## 2018-08-10 MED ORDER — PROMETHAZINE HCL 12.5 MG PO TABS: 12.5000 mg | ORAL_TABLET | Freq: Four times a day (QID) | ORAL | 0 refills | Status: DC | PRN

## 2018-08-10 MED ORDER — PROCHLORPERAZINE EDISYLATE 10 MG/2ML IJ SOLN
5.0000 mg | Freq: Once | INTRAMUSCULAR | Status: AC
  Administered 2018-08-10: 5 mg via INTRAVENOUS
  Filled 2018-08-10: qty 2

## 2018-08-10 MED ORDER — HYDROMORPHONE HCL 1 MG/ML IJ SOLN
0.5000 mg | Freq: Once | INTRAMUSCULAR | Status: AC
  Administered 2018-08-10: 0.5 mg via INTRAVENOUS
  Filled 2018-08-10: qty 1

## 2018-08-10 MED ORDER — CLINDAMYCIN PHOSPHATE 600 MG/50ML IV SOLN
600.0000 mg | Freq: Once | INTRAVENOUS | Status: AC
  Administered 2018-08-10: 600 mg via INTRAVENOUS
  Filled 2018-08-10: qty 50

## 2018-08-10 NOTE — ED Triage Notes (Signed)
Pt stating that she has abscesses under bilateral axilla. Pt stating she has had this problem for "a long time." Pt states she has now started vomiting and cold chills. Pt states she is prescribed 2 antibiotics at this time for the issue.

## 2018-08-10 NOTE — ED Provider Notes (Signed)
[facility] EMERGENCY DEPARTMENT Provider Note   CSN: [id number] Arrival date & time: 08/09/18  2337     History   Chief Complaint Chief Complaint  Patient presents with  . Abscess    HPI Kathleen Velasquez is a 28 y.o. female.     Patient is a 28 year old female who presents to the emergency department with a complaint of abscess areas under both arms.  The patient states she has a history of hidradenitis.  She has required surgery under both arms in the past.  She is having another bout with the hidradenitis.  She says that she has been on doxycycline for the past 9 days.  On yesterday July 27 she was placed on clindamycin and Toradol.  She advised her physician that the Toradol was only helping for a couple of hours.  She was then given Ultram to use.  No fever reported.  Today before taking her medication she became nauseated.  She felt as though she was having some chills and she became concerned that perhaps she was developing sepsis.  She came to the emergency department tonight for an additional evaluation.  It is of note that the patient has an appointment with dermatology on tomorrow, she has an appointment with general surgery on August 20.   Abscess Associated symptoms: no nausea and no vomiting     Past Medical History:  Diagnosis Date  . Abscess    right buttocks/thigh  . DUB (dysfunctional uterine bleeding) 05/25/2012  . Hydradenitis   . Polycystic ovaries   . Thyroid disease    hypothyroidism    Patient Active Problem List   Diagnosis Date Noted  . Anovulatory (dysfunctional uterine) bleeding 06/07/2012  . DUB (dysfunctional uterine bleeding) 05/25/2012  . FOOT PAIN, CHRONIC 05/09/2007    Past Surgical History:  Procedure Laterality Date  . ADENOIDECTOMY    . CHOLECYSTECTOMY  01/27/2012   Procedure: LAPAROSCOPIC CHOLECYSTECTOMY;  Surgeon: Donato Heinz, MD;  Location: [facility];  Service: General;  Laterality: N/A;  . HERNIA REPAIR    . HYDRADENITIS  EXCISION  10/02/2011   Procedure: EXCISION HYDRADENITIS AXILLA;  Surgeon: Donato Heinz, MD;  Location: [facility];  Service: General;  Laterality: Right;  Right Axillary Dissection  . HYDRADENITIS EXCISION Left 10/19/2013   Procedure: INCISION OF HIDRADENITIS SUPPURATIVA LEFT AXILLA;  Surgeon: Scherry Ran, MD;  Location: [facility];  Service: General;  Laterality: Left;  . TONSILLECTOMY       OB History    Gravida  0   Para      Term      Preterm      AB      Living  0     SAB      TAB      Ectopic      Multiple      Live Births               Home Medications    Prior to Admission medications   Medication Sig Start Date End Date Taking? Authorizing Provider  acetaminophen (TYLENOL) 500 MG tablet Take 1,000 mg by mouth every 6 (six) hours as needed for moderate pain.    [provider]  amphetamine-dextroamphetamine (ADDERALL) 20 MG tablet Take 1 tablet (20 mg total) by mouth 2 (two) times daily. 05/24/18 05/24/19  Cloria Spring, MD  amphetamine-dextroamphetamine (ADDERALL) 20 MG tablet Take 1 tablet (20 mg total) by mouth 2 (two) times daily. 05/24/18 05/24/19  Levonne Spiller  R, MD  amphetamine-dextroamphetamine (ADDERALL) 20 MG tablet Take 1 tablet (20 mg total) by mouth 2 (two) times daily. 05/24/18 05/24/19  Myrlene Brokeross, Deborah R, MD  clonazePAM (KLONOPIN) 1 MG tablet Take 1 tablet (1 mg total) by mouth 2 (two) times daily as needed for anxiety. 05/24/18 05/24/19  Myrlene Brokeross, Deborah R, MD  escitalopram (LEXAPRO) 20 MG tablet Take 1 tablet (20 mg total) by mouth daily. 05/24/18 05/24/19  Myrlene Brokeross, Deborah R, MD  NUVARING 0.12-0.015 MG/24HR vaginal ring INSERT VAGINALLY AND LEAVE IN. PLACE FOR 3 CONSECUTIVE WEEKS, THEN REMOVE FOR 1 WEEK 11/28/15   Constant, Peggy, MD  omeprazole (PRILOSEC) 20 MG capsule Take 1 capsule (20 mg total) by mouth daily. 09/07/17   Glynn Octaveancour, Stephen, MD    Family History Family History  Problem Relation Age of Onset  . Diabetes Mother   . Hypertension  Mother   . Hyperlipidemia Mother   . Neuropathy Mother   . Diabetes Maternal Grandmother   . Hypertension Maternal Grandmother   . Heart disease Maternal Grandmother        CHF  . COPD Maternal Grandmother   . Cirrhosis Maternal Grandmother   . Anxiety disorder Maternal Grandmother   . Cancer Maternal Grandfather        lung  . Diabetes Paternal Grandmother   . Diabetes Paternal Grandfather   . Aneurysm Paternal Grandfather   . Anxiety disorder Paternal Grandfather   . Drug abuse Father   . ADD / ADHD Brother   . Stroke Other   . Anxiety disorder Maternal Uncle   . Anxiety disorder Paternal Uncle   . Depression Paternal Uncle   . Anxiety disorder Maternal Aunt     Social History Social History   Tobacco Use  . Smoking status: Current Every Day Smoker    Packs/day: 0.50    Years: 7.00    Pack years: 3.50    Types: Cigarettes  . Smokeless tobacco: Never Used  Substance Use Topics  . Alcohol use: No    Comment: social use . 01-19-14 per pt no  . Drug use: No     Allergies   Bactrim, Ciprofloxacin, Other, Penicillins, Percocet [oxycodone-acetaminophen], and Vancomycin   Review of Systems Review of Systems  Constitutional: Negative for activity change and appetite change.  HENT: Negative for congestion, ear discharge, ear pain, facial swelling, nosebleeds, rhinorrhea, sneezing and tinnitus.   Eyes: Negative for photophobia, pain and discharge.  Respiratory: Negative for cough, choking, shortness of breath and wheezing.   Cardiovascular: Negative for chest pain, palpitations and leg swelling.  Gastrointestinal: Negative for abdominal pain, blood in stool, constipation, diarrhea, nausea and vomiting.  Genitourinary: Negative for difficulty urinating, dysuria, flank pain, frequency and hematuria.  Musculoskeletal: Negative for back pain, gait problem, myalgias and neck pain.  Skin: Negative for color change.       Recurrent abscesses  Neurological: Negative for  dizziness, seizures, syncope, facial asymmetry, speech difficulty, weakness and numbness.  Hematological: Negative for adenopathy. Does not bruise/bleed easily.  Psychiatric/Behavioral: Negative for agitation, confusion, hallucinations, self-injury and suicidal ideas. The patient is not nervous/anxious.      Physical Exam Updated Vital Signs BP (!) 150/99 (BP Location: Left Arm)   Pulse 81   Temp 98.1 F (36.7 C) (Oral)   Resp 17   SpO2 100%   Physical Exam Vitals signs and nursing note reviewed.  Constitutional:      Appearance: She is well-developed. She is not toxic-appearing.  HENT:     Head: Normocephalic.  Right Ear: Tympanic membrane and external ear normal.     Left Ear: Tympanic membrane and external ear normal.  Eyes:     General: Lids are normal.     Pupils: Pupils are equal, round, and reactive to light.  Neck:     Musculoskeletal: Normal range of motion and neck supple.     Vascular: No carotid bruit.  Cardiovascular:     Rate and Rhythm: Normal rate and regular rhythm.     Pulses: Normal pulses.     Heart sounds: Normal heart sounds.  Pulmonary:     Effort: No respiratory distress.     Breath sounds: Normal breath sounds.  Abdominal:     General: Bowel sounds are normal.     Palpations: Abdomen is soft.     Tenderness: There is no abdominal tenderness. There is no guarding.  Musculoskeletal: Normal range of motion.  Lymphadenopathy:     Head:     Right side of head: No submandibular adenopathy.     Left side of head: No submandibular adenopathy.     Cervical: No cervical adenopathy.  Skin:    General: Skin is warm and dry.     Comments: Patient has well-healed scar tissue of the right axilla.  There is tenderness to palpation along the surgical line.  There is minimal amount of redness present, the axilla is not hot to touch.  There is well-healed surgical scars of the left axilla.  There is a small red raised abscess area without fluctuance present.   It is tender to touch.  It is warm, but not hot.  The axilla is not hot.  There are no draining areas appreciated.  Neurological:     Mental Status: She is alert and oriented to person, place, and time.     Cranial Nerves: No cranial nerve deficit.     Sensory: No sensory deficit.  Psychiatric:        Speech: Speech normal.      ED Treatments / Results  Labs (all labs ordered are listed, but only abnormal results are displayed) Labs Reviewed - No data to display  EKG None  Radiology No results found.  Procedures Procedures (including critical care time)  Medications Ordered in ED Medications  HYDROmorphone (DILAUDID) injection 0.5 mg (has no administration in time range)  prochlorperazine (COMPAZINE) injection 5 mg (has no administration in time range)  clindamycin (CLEOCIN) IVPB 600 mg (has no administration in time range)     Initial Impression / Assessment and Plan / ED Course  I have reviewed the triage vital signs and the nursing notes.  Pertinent labs & imaging results that were available during my care of the patient were reviewed by me and considered in my medical decision making (see chart for details).          Final Clinical Impressions(s) / ED Diagnoses MDM Vital signs reviewed.  The blood pressure is elevated at 150/99, otherwise the vital signs are within normal limits.  There is been no vomiting since the patient has been here in the emergency department.  The patient however continues to be in pain.  Examination of the affected areas shows evidence of previous surgery for hidradenitis.  There is a small abscess noted on the left axilla.  There are no red streaks of the right or left axilla.  The right nor the left axilla is hot to touch.  There is no temperature elevation, no elevation in heart rate, no changes in blood  pressure.  I have reassured the patient that no signs of sepsis noted at this time.  The plan at this time will be for the patient  to receive IV pain medication, nausea medication, and antibiotic.  The patient will be given a prescription for nausea medicine in the event that the nausea continues so that she will be able to continue her current regiment.  I also encouraged the patient to continue the plans for her dermatology evaluation on tomorrow, and her surgical evaluation in a couple of weeks.  The patient is in agreement with this plan.   Final diagnoses:  Hidradenitis suppurativa of left axilla  Hidradenitis suppurativa of right axilla    ED Discharge Orders    None       Ivery QualeBryant, Phinehas Grounds, PA-C 08/10/18 1022    Rancour, Jeannett SeniorStephen, MD 08/11/18 [phone]

## 2018-08-10 NOTE — Discharge Instructions (Addendum)
Your vital signs are within normal limits with the exception of your blood pressure being elevated at 150/99.  Your examination does not favor sepsis at this time.  You were treated in the emergency department for pain.  You were given an IV dose of your clindamycin.  You were also given medication for nausea.  Please keep your appointment with the dermatologist on tomorrow, and with your general surgeon as scheduled.  Please see your primary physician or return to the emergency department if any high fevers, nausea/vomiting that is not controlled by the promethazine that has been ordered, worsening of your condition, changes in your symptoms, problems, or concerns.

## 2018-08-13 HISTORY — PX: SHOULDER SURGERY: SHX246

## 2018-08-20 ENCOUNTER — Other Ambulatory Visit (HOSPITAL_COMMUNITY): Payer: Self-pay | Admitting: Psychiatry

## 2018-08-24 ENCOUNTER — Encounter (HOSPITAL_COMMUNITY): Payer: Self-pay | Admitting: Psychiatry

## 2018-08-24 ENCOUNTER — Other Ambulatory Visit: Payer: Self-pay

## 2018-08-24 ENCOUNTER — Ambulatory Visit (INDEPENDENT_AMBULATORY_CARE_PROVIDER_SITE_OTHER): Payer: BC Managed Care – PPO | Admitting: Psychiatry

## 2018-08-24 DIAGNOSIS — F411 Generalized anxiety disorder: Secondary | ICD-10-CM

## 2018-08-24 DIAGNOSIS — F900 Attention-deficit hyperactivity disorder, predominantly inattentive type: Secondary | ICD-10-CM

## 2018-08-24 MED ORDER — AMPHETAMINE-DEXTROAMPHETAMINE 20 MG PO TABS: 20.0000 mg | ORAL_TABLET | Freq: Two times a day (BID) | ORAL | 0 refills | Status: DC

## 2018-08-24 MED ORDER — ESCITALOPRAM OXALATE 20 MG PO TABS: 20.0000 mg | ORAL_TABLET | Freq: Every day | ORAL | 2 refills | Status: DC

## 2018-08-24 MED ORDER — CLONAZEPAM 1 MG PO TABS: ORAL_TABLET | ORAL | 2 refills | Status: DC

## 2018-08-24 NOTE — Progress Notes (Signed)
Virtual Visit via Video Note  I connected with Kathleen Velasquez on 08/24/18 at 11:00 AM EDT by a video enabled telemedicine application and verified that I am speaking with the correct person using two identifiers.   I discussed the limitations of evaluation and management by telemedicine and the availability of in person appointments. The patient expressed understanding and agreed to proceed.     I discussed the assessment and treatment plan with the patient. The patient was provided an opportunity to ask questions and all were answered. The patient agreed with the plan and demonstrated an understanding of the instructions.   The patient was advised to call back or seek an in-person evaluation if the symptoms worsen or if the condition fails to improve as anticipated.  I provided 15 minutes of non-face-to-face time during this encounter.   Diannia Rudereborah Ross, MD  [facility] MD/PA/NP OP Progress Note  08/24/2018 11:16 AM Kathleen Velasquez  MRN:  [id number]  Chief Complaint:  Chief Complaint    Depression; Anxiety; Follow-up     HPI: This patient is a 28 year old single white female who lives alone.  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  The patient returns for follow-up after 3 months.  She is still having difficulties with her shoulder is is getting physical therapy.  She is going to be reevaluated soon for return to work.  She states that she is returning from a beach trip and had a great time.  Her mood has been good and she denies any significant symptoms of depression or anxiety.  She is worried about finances because she is currently on short-term disability.  She is sleeping well and her focus is good.  She really does not have any specific complaints. Visit Diagnosis:    ICD-10-CM   1. Generalized anxiety disorder  F41.1   2. Attention deficit hyperactivity disorder (ADHD), predominantly inattentive type  F90.0     Past Psychiatric History: none  Past Medical History:  Past Medical  History:  Diagnosis Date  . Abscess    right buttocks/thigh  . DUB (dysfunctional uterine bleeding) 05/25/2012  . Hydradenitis   . Polycystic ovaries   . Thyroid disease    hypothyroidism    Past Surgical History:  Procedure Laterality Date  . ADENOIDECTOMY    . CHOLECYSTECTOMY  01/27/2012   Procedure: LAPAROSCOPIC CHOLECYSTECTOMY;  Surgeon: Fabio BeringBrent C Ziegler, MD;  Location: [facility];  Service: General;  Laterality: N/A;  . HERNIA REPAIR    . HYDRADENITIS EXCISION  10/02/2011   Procedure: EXCISION HYDRADENITIS AXILLA;  Surgeon: Fabio BeringBrent C Ziegler, MD;  Location: [facility];  Service: General;  Laterality: Right;  Right Axillary Dissection  . HYDRADENITIS EXCISION Left 10/19/2013   Procedure: INCISION OF HIDRADENITIS SUPPURATIVA LEFT AXILLA;  Surgeon: Marlane HatcherWilliam S Bradford, MD;  Location: [facility];  Service: General;  Laterality: Left;  . TONSILLECTOMY      Family Psychiatric History: see below  Family History:  Family History  Problem Relation Age of Onset  . Diabetes Mother   . Hypertension Mother   . Hyperlipidemia Mother   . Neuropathy Mother   . Diabetes Maternal Grandmother   . Hypertension Maternal Grandmother   . Heart disease Maternal Grandmother        CHF  . COPD Maternal Grandmother   . Cirrhosis Maternal Grandmother   . Anxiety disorder Maternal Grandmother   . Cancer Maternal Grandfather        lung  . Diabetes Paternal Grandmother   .  Diabetes Paternal Grandfather   . Aneurysm Paternal Grandfather   . Anxiety disorder Paternal Grandfather   . Drug abuse Father   . ADD / ADHD Brother   . Stroke Other   . Anxiety disorder Maternal Uncle   . Anxiety disorder Paternal Uncle   . Depression Paternal Uncle   . Anxiety disorder Maternal Aunt     Social History:  Social History   Socioeconomic History  . Marital status: Single    Spouse name: Not on file  . Number of children: Not on file  . Years of education: Not on file  . Highest education level: Not on file   Occupational History  . Not on file  Social Needs  . Financial resource strain: Not on file  . Food insecurity    Worry: Not on file    Inability: Not on file  . Transportation needs    Medical: Not on file    Non-medical: Not on file  Tobacco Use  . Smoking status: Current Every Day Smoker    Packs/day: 0.50    Years: 7.00    Pack years: 3.50    Types: Cigarettes  . Smokeless tobacco: Never Used  Substance and Sexual Activity  . Alcohol use: No    Comment: social use . 01-19-14 per pt no  . Drug use: No  . Sexual activity: Yes    Birth control/protection: None  Lifestyle  . Physical activity    Days per week: Not on file    Minutes per session: Not on file  . Stress: Not on file  Relationships  . Social Herbalist on phone: Not on file    Gets together: Not on file    Attends religious service: Not on file    Active member of club or organization: Not on file    Attends meetings of clubs or organizations: Not on file    Relationship status: Not on file  Other Topics Concern  . Not on file  Social History Narrative  . Not on file    Allergies:  Allergies  Allergen Reactions  . Bactrim Hives and Itching  . Ciprofloxacin Hives and Itching    CRNA Discontinued preop antibiotic  IVPB  Cipro in OR after developed itching and hives left arm.  Received Benadryl with relief noted. Dr.Gonzalez and Dr. Geroge Baseman informed. DDallasRN  . Other Other (See Comments)    [place] causes a hives, swelling, itching, and anaphylaxis.   Marland Kitchen Penicillins Hives and Itching    Has patient had a PCN reaction causing immediate rash, facial/tongue/throat swelling, SOB or lightheadedness with hypotension: Yes Has patient had a PCN reaction causing severe rash involving mucus membranes or skin necrosis: No Has patient had a PCN reaction that required hospitalization No Has patient had a PCN reaction occurring within the last 10 years: No If all of the above answers are "NO", then may  proceed with Cephalosporin use.   Marland Kitchen Percocet [Oxycodone-Acetaminophen] Other (See Comments)    Face flushes  . Vancomycin     Itching,     Metabolic Disorder Labs: Lab Results  Component Value Date   HGBA1C 5.7 (H) 05/25/2012   MPG 117 (H) 05/25/2012   No results found for: PROLACTIN No results found for: CHOL, TRIG, HDL, CHOLHDL, VLDL, LDLCALC Lab Results  Component Value Date   TSH 2.515 05/25/2012    Therapeutic Level Labs: No results found for: LITHIUM No results found for: VALPROATE No components found for:  CBMZ  Current Medications: Current Outpatient Medications  Medication Sig Dispense Refill  . acetaminophen (TYLENOL) 500 MG tablet Take 1,000 mg by mouth every 6 (six) hours as needed for moderate pain.    Marland Kitchen. amphetamine-dextroamphetamine (ADDERALL) 20 MG tablet Take 1 tablet (20 mg total) by mouth 2 (two) times daily. 60 tablet 0  . amphetamine-dextroamphetamine (ADDERALL) 20 MG tablet Take 1 tablet (20 mg total) by mouth 2 (two) times daily. 60 tablet 0  . amphetamine-dextroamphetamine (ADDERALL) 20 MG tablet Take 1 tablet (20 mg total) by mouth 2 (two) times daily. 60 tablet 0  . clonazePAM (KLONOPIN) 1 MG tablet TAKE 1 TABLET(1 MG) BY MOUTH TWICE DAILY AS NEEDED FOR ANXIETY 60 tablet 2  . escitalopram (LEXAPRO) 20 MG tablet Take 1 tablet (20 mg total) by mouth daily. 90 tablet 2  . NUVARING 0.12-0.015 MG/24HR vaginal ring INSERT VAGINALLY AND LEAVE IN. PLACE FOR 3 CONSECUTIVE WEEKS, THEN REMOVE FOR 1 WEEK 1 each 10  . omeprazole (PRILOSEC) 20 MG capsule Take 1 capsule (20 mg total) by mouth daily. 30 capsule 0  . promethazine (PHENERGAN) 12.5 MG tablet Take 1 tablet (12.5 mg total) by mouth every 6 (six) hours as needed. 12 tablet 0   No current facility-administered medications for this visit.      Musculoskeletal: Strength & Muscle Tone: within normal limits Gait & Station: normal Patient leans: N/A  Psychiatric Specialty Exam: Review of Systems   Musculoskeletal: Positive for joint pain.  All other systems reviewed and are negative.   There were no vitals taken for this visit.There is no height or weight on file to calculate BMI.  General Appearance: Casual and Fairly Groomed  Eye Contact:  Good  Speech:  Clear and Coherent  Volume:  Normal  Mood:  Euthymic  Affect:  Appropriate and Congruent  Thought Process:  Goal Directed  Orientation:  Full (Time, Place, and Person)  Thought Content: WDL   Suicidal Thoughts:  No  Homicidal Thoughts:  No  Memory:  Immediate;   Good Recent;   Good Remote;   Good  Judgement:  Good  Insight:  Fair  Psychomotor Activity:  Normal  Concentration:  Concentration: Good and Attention Span: Good  Recall:  Good  Fund of Knowledge: Good  Language: Good  Akathisia:  No  Handed:  Right  AIMS (if indicated): not done  Assets:  Communication Skills Desire for Improvement Physical Health Resilience Social Support Talents/Skills  ADL's:  Intact  Cognition: WNL  Sleep:  Good   Screenings:   Assessment and Plan: This patient is a 28 year old female with a history of depression, anxiety and ADHD.  She is doing well on her current regimen.  She will continue Adderall 20 mg twice daily for ADHD, Lexapro 20 mg daily for depression and anxiety and clonazepam 1 mg twice daily for anxiety.  She will return to see me in 3 months   Diannia Rudereborah Ross, MD 08/24/2018, 11:16 AM

## 2018-08-25 DIAGNOSIS — M7541 Impingement syndrome of right shoulder: Secondary | ICD-10-CM | POA: Diagnosis not present

## 2018-08-31 DIAGNOSIS — M7551 Bursitis of right shoulder: Secondary | ICD-10-CM | POA: Diagnosis not present

## 2018-08-31 DIAGNOSIS — M7541 Impingement syndrome of right shoulder: Secondary | ICD-10-CM | POA: Diagnosis not present

## 2018-09-01 ENCOUNTER — Encounter: Payer: Self-pay | Admitting: General Surgery

## 2018-09-01 ENCOUNTER — Other Ambulatory Visit: Payer: Self-pay

## 2018-09-01 ENCOUNTER — Ambulatory Visit: Payer: BC Managed Care – PPO | Admitting: General Surgery

## 2018-09-01 VITALS — BP 146/92 | HR 77 | Temp 97.7°F | Resp 18 | Ht 64.0 in | Wt 262.0 lb

## 2018-09-01 DIAGNOSIS — L732 Hidradenitis suppurativa: Secondary | ICD-10-CM | POA: Diagnosis not present

## 2018-09-01 NOTE — H&P (Signed)
Gaetano HawthorneBrittany R Nurse; [id number]; 03/17/1990   HPI Patient is a 28 year old white female who was referred to my care for evaluation and treatment of axillary hidradenitis by Dr. Margo AyeHall.  Patient has had a longstanding history of hidradenitis of the axillas, requiring surgical intervention bilaterally.  She has recently had intermittent drainage from the left axilla over the past year.  She had previous surgical excision in the left axilla by another surgeon in 2015.  She currently has 4 out of 10 pain in the left axilla.  No recent fever or chills. Past Medical History:  Diagnosis Date  . Abscess    right buttocks/thigh  . DUB (dysfunctional uterine bleeding) 05/25/2012  . Hydradenitis   . Polycystic ovaries   . Thyroid disease    hypothyroidism    Past Surgical History:  Procedure Laterality Date  . ADENOIDECTOMY    . CHOLECYSTECTOMY  01/27/2012   Procedure: LAPAROSCOPIC CHOLECYSTECTOMY;  Surgeon: Fabio BeringBrent C Ziegler, MD;  Location: [facility];  Service: General;  Laterality: N/A;  . HERNIA REPAIR    . HYDRADENITIS EXCISION  10/02/2011   Procedure: EXCISION HYDRADENITIS AXILLA;  Surgeon: Fabio BeringBrent C Ziegler, MD;  Location: [facility];  Service: General;  Laterality: Right;  Right Axillary Dissection  . HYDRADENITIS EXCISION Left 10/19/2013   Procedure: INCISION OF HIDRADENITIS SUPPURATIVA LEFT AXILLA;  Surgeon: Marlane HatcherWilliam S Bradford, MD;  Location: [facility];  Service: General;  Laterality: Left;  . TONSILLECTOMY      Family History  Problem Relation Age of Onset  . Diabetes Mother   . Hypertension Mother   . Hyperlipidemia Mother   . Neuropathy Mother   . Diabetes Maternal Grandmother   . Hypertension Maternal Grandmother   . Heart disease Maternal Grandmother        CHF  . COPD Maternal Grandmother   . Cirrhosis Maternal Grandmother   . Anxiety disorder Maternal Grandmother   . Cancer Maternal Grandfather        lung  . Diabetes Paternal Grandmother   . Diabetes Paternal Grandfather   . Aneurysm  Paternal Grandfather   . Anxiety disorder Paternal Grandfather   . Drug abuse Father   . ADD / ADHD Brother   . Stroke Other   . Anxiety disorder Maternal Uncle   . Anxiety disorder Paternal Uncle   . Depression Paternal Uncle   . Anxiety disorder Maternal Aunt     Current Outpatient Medications on File Prior to Visit  Medication Sig Dispense Refill  . acetaminophen (TYLENOL) 500 MG tablet Take 1,000 mg by mouth every 6 (six) hours as needed for moderate pain.    Marland Kitchen. amphetamine-dextroamphetamine (ADDERALL) 20 MG tablet Take 1 tablet (20 mg total) by mouth 2 (two) times daily. 60 tablet 0  . amphetamine-dextroamphetamine (ADDERALL) 20 MG tablet Take 1 tablet (20 mg total) by mouth 2 (two) times daily. 60 tablet 0  . amphetamine-dextroamphetamine (ADDERALL) 20 MG tablet Take 1 tablet (20 mg total) by mouth 2 (two) times daily. 60 tablet 0  . clindamycin (CLEOCIN) 300 MG capsule Take 300 mg by mouth 2 (two) times daily.     . clonazePAM (KLONOPIN) 1 MG tablet TAKE 1 TABLET(1 MG) BY MOUTH TWICE DAILY AS NEEDED FOR ANXIETY (Patient taking differently: Take 1 mg by mouth 2 (two) times daily as needed for anxiety. ) 60 tablet 2  . escitalopram (LEXAPRO) 20 MG tablet Take 1 tablet (20 mg total) by mouth daily. 90 tablet 2  . NUVARING 0.12-0.015 MG/24HR vaginal ring INSERT VAGINALLY AND  LEAVE IN. PLACE FOR 3 CONSECUTIVE WEEKS, THEN REMOVE FOR 1 WEEK (Patient taking differently: Place 1 each vaginally every 28 (twenty-eight) days. ) 1 each 10  . omeprazole (PRILOSEC) 20 MG capsule Take 1 capsule (20 mg total) by mouth daily. (Patient not taking: Reported on 09/01/2018) 30 capsule 0  . promethazine (PHENERGAN) 12.5 MG tablet Take 1 tablet (12.5 mg total) by mouth every 6 (six) hours as needed. (Patient not taking: Reported on 09/01/2018) 12 tablet 0   No current facility-administered medications on file prior to visit.     Allergies  Allergen Reactions  . Bactrim Hives and Itching  . Ciprofloxacin  Hives and Itching    CRNA Discontinued preop antibiotic  IVPB  Cipro in OR after developed itching and hives left arm.  Received Benadryl with relief noted. Dr.Gonzalez and Dr. Geroge Baseman informed. DDallasRN  . Other Other (See Comments)    [place] causes a hives, swelling, itching, and anaphylaxis.   Marland Kitchen Penicillins Hives and Itching    Has patient had a PCN reaction causing immediate rash, facial/tongue/throat swelling, SOB or lightheadedness with hypotension: Yes Has patient had a PCN reaction causing severe rash involving mucus membranes or skin necrosis: No Has patient had a PCN reaction that required hospitalization No Has patient had a PCN reaction occurring within the last 10 years: No If all of the above answers are "NO", then may proceed with Cephalosporin use.   Marland Kitchen Percocet [Oxycodone-Acetaminophen] Other (See Comments)    Face flushes  . Vancomycin Itching    Social History   Substance and Sexual Activity  Alcohol Use No   Comment: social use . 01-19-14 per pt no    Social History   Tobacco Use  Smoking Status Current Every Day Smoker  . Packs/day: 0.50  . Years: 7.00  . Pack years: 3.50  . Types: Cigarettes  Smokeless Tobacco Never Used    Review of Systems  Constitutional: Negative.   HENT: Negative.   Eyes: Negative.   Respiratory: Negative.   Cardiovascular: Negative.   Gastrointestinal: Negative.   Genitourinary: Negative.   Musculoskeletal: Negative.   Skin: Negative.   Neurological: Positive for sensory change.  Endo/Heme/Allergies: Negative.   Psychiatric/Behavioral: Negative.     Objective   Vitals:   09/01/18 1007  BP: (!) 146/92  Pulse: 77  Resp: 18  Temp: 97.7 F (36.5 C)  SpO2: 96%    Physical Exam Vitals signs reviewed.  Constitutional:      Appearance: Normal appearance. She is obese.  HENT:     Head: Normocephalic and atraumatic.  Cardiovascular:     Rate and Rhythm: Normal rate and regular rhythm.     Heart sounds: Normal heart  sounds. No murmur. No friction rub. No gallop.   Pulmonary:     Effort: Pulmonary effort is normal. No respiratory distress.     Breath sounds: Normal breath sounds. No stridor. No wheezing or rales.  Skin:    Comments: Left axilla with 2 linear areas of erythematous fluctuant skin.  Multiple areas of healed surgical scar noted.  No frank abscess cavity is identified.  Right axilla with healed surgical scar and minimal involvement of hidradenitis.  No open wounds present.  Neurological:     Mental Status: She is alert and oriented to person, place, and time.    Previous operative notes reviewed, primary care notes reviewed Assessment  Hidradenitis of axillas, primarily left Plan   Patient is scheduled for excision of the hidradenitis of the left on  09/09/2018.  The risks and benefits of the procedure including bleeding, infection, wound breakdown, and the possibility of recurrence of the hidradenitis were fully explained to the patient, who gave informed consent.  She is aware that I will not be able to cure her of the extensive hidradenitis and will only be taking care of the immediate acute involvement.

## 2018-09-01 NOTE — Progress Notes (Signed)
Gaetano HawthorneBrittany R Nurse; [id number]; 03/17/1990   HPI Patient is a 28 year old white female who was referred to my care for evaluation and treatment of axillary hidradenitis by Dr. Margo AyeHall.  Patient has had a longstanding history of hidradenitis of the axillas, requiring surgical intervention bilaterally.  She has recently had intermittent drainage from the left axilla over the past year.  She had previous surgical excision in the left axilla by another surgeon in 2015.  She currently has 4 out of 10 pain in the left axilla.  No recent fever or chills. Past Medical History:  Diagnosis Date  . Abscess    right buttocks/thigh  . DUB (dysfunctional uterine bleeding) 05/25/2012  . Hydradenitis   . Polycystic ovaries   . Thyroid disease    hypothyroidism    Past Surgical History:  Procedure Laterality Date  . ADENOIDECTOMY    . CHOLECYSTECTOMY  01/27/2012   Procedure: LAPAROSCOPIC CHOLECYSTECTOMY;  Surgeon: Fabio BeringBrent C Ziegler, MD;  Location: [facility];  Service: General;  Laterality: N/A;  . HERNIA REPAIR    . HYDRADENITIS EXCISION  10/02/2011   Procedure: EXCISION HYDRADENITIS AXILLA;  Surgeon: Fabio BeringBrent C Ziegler, MD;  Location: [facility];  Service: General;  Laterality: Right;  Right Axillary Dissection  . HYDRADENITIS EXCISION Left 10/19/2013   Procedure: INCISION OF HIDRADENITIS SUPPURATIVA LEFT AXILLA;  Surgeon: Marlane HatcherWilliam S Bradford, MD;  Location: [facility];  Service: General;  Laterality: Left;  . TONSILLECTOMY      Family History  Problem Relation Age of Onset  . Diabetes Mother   . Hypertension Mother   . Hyperlipidemia Mother   . Neuropathy Mother   . Diabetes Maternal Grandmother   . Hypertension Maternal Grandmother   . Heart disease Maternal Grandmother        CHF  . COPD Maternal Grandmother   . Cirrhosis Maternal Grandmother   . Anxiety disorder Maternal Grandmother   . Cancer Maternal Grandfather        lung  . Diabetes Paternal Grandmother   . Diabetes Paternal Grandfather   . Aneurysm  Paternal Grandfather   . Anxiety disorder Paternal Grandfather   . Drug abuse Father   . ADD / ADHD Brother   . Stroke Other   . Anxiety disorder Maternal Uncle   . Anxiety disorder Paternal Uncle   . Depression Paternal Uncle   . Anxiety disorder Maternal Aunt     Current Outpatient Medications on File Prior to Visit  Medication Sig Dispense Refill  . acetaminophen (TYLENOL) 500 MG tablet Take 1,000 mg by mouth every 6 (six) hours as needed for moderate pain.    Marland Kitchen. amphetamine-dextroamphetamine (ADDERALL) 20 MG tablet Take 1 tablet (20 mg total) by mouth 2 (two) times daily. 60 tablet 0  . amphetamine-dextroamphetamine (ADDERALL) 20 MG tablet Take 1 tablet (20 mg total) by mouth 2 (two) times daily. 60 tablet 0  . amphetamine-dextroamphetamine (ADDERALL) 20 MG tablet Take 1 tablet (20 mg total) by mouth 2 (two) times daily. 60 tablet 0  . clindamycin (CLEOCIN) 300 MG capsule Take 300 mg by mouth 2 (two) times daily.     . clonazePAM (KLONOPIN) 1 MG tablet TAKE 1 TABLET(1 MG) BY MOUTH TWICE DAILY AS NEEDED FOR ANXIETY (Patient taking differently: Take 1 mg by mouth 2 (two) times daily as needed for anxiety. ) 60 tablet 2  . escitalopram (LEXAPRO) 20 MG tablet Take 1 tablet (20 mg total) by mouth daily. 90 tablet 2  . NUVARING 0.12-0.015 MG/24HR vaginal ring INSERT VAGINALLY AND  LEAVE IN. PLACE FOR 3 CONSECUTIVE WEEKS, THEN REMOVE FOR 1 WEEK (Patient taking differently: Place 1 each vaginally every 28 (twenty-eight) days. ) 1 each 10  . omeprazole (PRILOSEC) 20 MG capsule Take 1 capsule (20 mg total) by mouth daily. (Patient not taking: Reported on 09/01/2018) 30 capsule 0  . promethazine (PHENERGAN) 12.5 MG tablet Take 1 tablet (12.5 mg total) by mouth every 6 (six) hours as needed. (Patient not taking: Reported on 09/01/2018) 12 tablet 0   No current facility-administered medications on file prior to visit.     Allergies  Allergen Reactions  . Bactrim Hives and Itching  . Ciprofloxacin  Hives and Itching    CRNA Discontinued preop antibiotic  IVPB  Cipro in OR after developed itching and hives left arm.  Received Benadryl with relief noted. Dr.Gonzalez and Dr. Geroge Baseman informed. DDallasRN  . Other Other (See Comments)    [place] causes a hives, swelling, itching, and anaphylaxis.   Marland Kitchen Penicillins Hives and Itching    Has patient had a PCN reaction causing immediate rash, facial/tongue/throat swelling, SOB or lightheadedness with hypotension: Yes Has patient had a PCN reaction causing severe rash involving mucus membranes or skin necrosis: No Has patient had a PCN reaction that required hospitalization No Has patient had a PCN reaction occurring within the last 10 years: No If all of the above answers are "NO", then may proceed with Cephalosporin use.   Marland Kitchen Percocet [Oxycodone-Acetaminophen] Other (See Comments)    Face flushes  . Vancomycin Itching    Social History   Substance and Sexual Activity  Alcohol Use No   Comment: social use . 01-19-14 per pt no    Social History   Tobacco Use  Smoking Status Current Every Day Smoker  . Packs/day: 0.50  . Years: 7.00  . Pack years: 3.50  . Types: Cigarettes  Smokeless Tobacco Never Used    Review of Systems  Constitutional: Negative.   HENT: Negative.   Eyes: Negative.   Respiratory: Negative.   Cardiovascular: Negative.   Gastrointestinal: Negative.   Genitourinary: Negative.   Musculoskeletal: Negative.   Skin: Negative.   Neurological: Positive for sensory change.  Endo/Heme/Allergies: Negative.   Psychiatric/Behavioral: Negative.     Objective   Vitals:   09/01/18 1007  BP: (!) 146/92  Pulse: 77  Resp: 18  Temp: 97.7 F (36.5 C)  SpO2: 96%    Physical Exam Vitals signs reviewed.  Constitutional:      Appearance: Normal appearance. She is obese.  HENT:     Head: Normocephalic and atraumatic.  Cardiovascular:     Rate and Rhythm: Normal rate and regular rhythm.     Heart sounds: Normal heart  sounds. No murmur. No friction rub. No gallop.   Pulmonary:     Effort: Pulmonary effort is normal. No respiratory distress.     Breath sounds: Normal breath sounds. No stridor. No wheezing or rales.  Skin:    Comments: Left axilla with 2 linear areas of erythematous fluctuant skin.  Multiple areas of healed surgical scar noted.  No frank abscess cavity is identified.  Right axilla with healed surgical scar and minimal involvement of hidradenitis.  No open wounds present.  Neurological:     Mental Status: She is alert and oriented to person, place, and time.    Previous operative notes reviewed, primary care notes reviewed Assessment  Hidradenitis of axillas, primarily left Plan   Patient is scheduled for excision of the hidradenitis of the left on  09/09/2018.  The risks and benefits of the procedure including bleeding, infection, wound breakdown, and the possibility of recurrence of the hidradenitis were fully explained to the patient, who gave informed consent.  She is aware that I will not be able to cure her of the extensive hidradenitis and will only be taking care of the immediate acute involvement.  

## 2018-09-01 NOTE — Patient Instructions (Signed)
 Hidradenitis Suppurativa Hidradenitis suppurativa is a long-term (chronic) skin disease. It is similar to a severe form of acne, but it affects areas of the body where acne would be unusual, especially areas of the body where skin rubs against skin and becomes moist. These include:  Underarms.  Groin.  Genital area.  Buttocks.  Upper thighs.  Breasts. Hidradenitis suppurativa may start out as small lumps or pimples caused by blocked sweat glands or hair follicles. Pimples may develop into deep sores that break open (rupture) and drain pus. Over time, affected areas of skin may thicken and become scarred. This condition is rare and does not spread from person to person (non-contagious). What are the causes? The exact cause of this condition is not known. It may be related to:  Female and female hormones.  An overactive disease-fighting system (immune system). The immune system may over-react to blocked hair follicles or sweat glands and cause swelling and pus-filled sores. What increases the risk? You are more likely to develop this condition if you:  Are female.  Are 11-55 years old.  Have a family history of hidradenitis suppurativa.  Have a personal history of acne.  Are overweight.  Smoke.  Take the medicine lithium. What are the signs or symptoms? The first symptoms are usually painful bumps in the skin, similar to pimples. The condition may get worse over time (progress), or it may only cause mild symptoms. If the disease progresses, symptoms may include:  Skin bumps getting bigger and growing deeper into the skin.  Bumps rupturing and draining pus.  Itchy, infected skin.  Skin getting thicker and scarred.  Tunnels under the skin (fistulas) where pus drains from a bump.  Pain during daily activities, such as pain during walking if your groin area is affected.  Emotional problems, such as stress or depression. This condition may affect your appearance and  your ability or willingness to wear certain clothes or do certain activities. How is this diagnosed? This condition is diagnosed by a health care provider who specializes in skin diseases (dermatologist). You may be diagnosed based on:  Your symptoms and medical history.  A physical exam.  Testing a pus sample for infection.  Blood tests. How is this treated? Your treatment will depend on how severe your symptoms are. The same treatment will not work for everybody with this condition. You may need to try several treatments to find what works best for you. Treatment may include:  Cleaning and bandaging (dressing) your wounds as needed.  Lifestyle changes, such as new skin care routines.  Taking medicines, such as: ? Antibiotics. ? Acne medicines. ? Medicines to reduce the activity of the immune system. ? A diabetes medicine (metformin). ? Birth control pills, for women. ? Steroids to reduce swelling and pain.  Working with a mental health care provider, if you experience emotional distress due to this condition. If you have severe symptoms that do not get better with medicine, you may need surgery. Surgery may involve:  Using a laser to clear the skin and remove hair follicles.  Opening and draining deep sores.  Removing the areas of skin that are diseased and scarred. Follow these instructions at home: Medicines   Take over-the-counter and prescription medicines only as told by your health care provider.  If you were prescribed an antibiotic medicine, take it as told by your health care provider. Do not stop taking the antibiotic even if your condition improves. Skin care  If you have open wounds,   cover them with a clean dressing as told by your health care provider. Keep wounds clean by washing them gently with soap and water when you bathe.  Do not shave the areas where you get hidradenitis suppurativa.  Do not wear deodorant.  Wear loose-fitting clothes.  Try to  avoid getting overheated or sweaty. If you get sweaty or wet, change into clean, dry clothes as soon as you can.  To help relieve pain and itchiness, cover sore areas with a warm, clean washcloth (warm compress) for 5-10 minutes as often as needed.  If told by your health care provider, take a bleach bath twice a week: ? Fill your bathtub halfway with water. ? Pour in  cup of unscented household bleach. ? Soak in the tub for 5-10 minutes. ? Only soak from the neck down. Avoid water on your face and hair. ? Shower to rinse off the bleach from your skin. General instructions  Learn as much as you can about your disease so that you have an active role in your treatment. Work closely with your health care provider to find treatments that work for you.  If you are overweight, work with your health care provider to lose weight as recommended.  Do not use any products that contain nicotine or tobacco, such as cigarettes and e-cigarettes. If you need help quitting, ask your health care provider.  If you struggle with living with this condition, talk with your health care provider or work with a mental health care provider as recommended.  Keep all follow-up visits as told by your health care provider. This is important. Where to find more information  [employer]id[employer]enitis [employer] [email or web address]://www.hs-foundation.org/ Contact a health care provider if you have:  A flare-up of hidradenitis suppurativa.  A fever or chills.  Trouble controlling your symptoms at home.  Trouble doing your daily activities because of your symptoms.  Trouble dealing with emotional problems related to your condition. Summary  Hidradenitis suppurativa is a long-term (chronic) skin disease. It is similar to a severe form of acne, but it affects areas of the body where acne would be unusual.  The first symptoms are usually painful bumps in the skin, similar to pimples. The condition may get worse over time  (progress), or it may only cause mild symptoms.  If you have open wounds, cover them with a clean dressing as told by your health care provider. Keep wounds clean by washing them gently with soap and water when you bathe.  Besides skin care, treatment may include medicines, laser treatment, and surgery. This information is not intended to replace advice given to you by your health care provider. Make sure you discuss any questions you have with your health care provider. Document Released: 08/13/2003 Document Revised: 01/06/2017 Document Reviewed: 01/06/2017 [employer] Patient Education  2020 [employer]  

## 2018-09-02 NOTE — Patient Instructions (Signed)
Kathleen HawthorneBrittany R Velasquez  09/02/2018     @PREFPERIOPPHARMACY @   Your procedure is scheduled on  09/09/2018  Report to Kathleen HawkingAnnie Velasquez at  [facility]  A.M.  Call this number if you have problems the morning of surgery:  [phone]   Remember:  Do not eat or drink after midnight.                          Take these medicines the morning of surgery with A SIP OF WATER  Adderall, clonazepam, lexapro.    Do not wear jewelry, make-up or nail polish.  Do not wear lotions, powders, or perfumes. Please do not wear deodorant under your left arm and brush your teeth.  Do not shave 48 hours prior to surgery.  Men may shave face and neck.  Do not bring valuables to the hospital.  [facility] or [facility]uables.  Contacts, dentures or bridgework may not be worn into surgery.  Leave your suitcase in the car.  After surgery it may be brought to your room.  For patients admitted to the hospital, discharge time will be determined by your treatment team.  Patients discharged the day of surgery will not be allowed to drive home.   Name and phone number of your driver:   family Special instructions:  None  Please read over the following fact sheets that you were given. Anesthesia Post-op Instructions and Care and Recovery After Surgery       Incision Care, Adult An incision is a cut that a doctor makes in your skin for surgery (for a procedure). Most times, these cuts are closed after surgery. Your cut from surgery may be closed with stitches (sutures), staples, skin glue, or skin tape (adhesive strips). You may need to return to your doctor to have stitches or staples taken out. This may happen many days or many weeks after your surgery. The cut needs to be well cared for so it does not get infected. How to care for your cut Cut care   Follow instructions from your doctor about how to take care of your cut. Make sure you: ? Wash your hands with soap and water before  you change your bandage (dressing). If you cannot use soap and water, use hand sanitizer. ? Change your bandage as told by your doctor. ? Leave stitches, skin glue, or skin tape in place. They may need to stay in place for 2 weeks or longer. If tape strips get loose and curl up, you may trim the loose edges. Do not remove tape strips completely unless your doctor says it is okay.  Check your cut area every day for signs of infection. Check for: ? More redness, swelling, or pain. ? More fluid or blood. ? Warmth. ? Pus or a bad smell.  Ask your doctor how to clean the cut. This may include: ? Using mild soap and water. ? Using a clean towel to pat the cut dry after you clean it. ? Putting a cream or ointment on the cut. Do this only as told by your doctor. ? Covering the cut with a clean bandage.  Ask your doctor when you can leave the cut uncovered.  Do not take baths, swim, or use a hot tub until your doctor says it is okay. Ask your doctor if you can take showers. You may only be allowed to take sponge baths for  bathing. Medicines  If you were prescribed an antibiotic medicine, cream, or ointment, take the antibiotic or put it on the cut as told by your doctor. Do not stop taking or putting on the antibiotic even if your condition gets better.  Take over-the-counter and prescription medicines only as told by your doctor. General instructions  Limit movement around your cut. This helps healing. ? Avoid straining, lifting, or exercise for the first month, or for as long as told by your doctor. ? Follow instructions from your doctor about going back to your normal activities. ? Ask your doctor what activities are safe.  Protect your cut from the sun when you are outside for the first 6 months, or for as long as told by your doctor. Put on sunscreen around the scar or cover up the scar.  Keep all follow-up visits as told by your doctor. This is important. Contact a doctor if:  Your  have more redness, swelling, or pain around the cut.  You have more fluid or blood coming from the cut.  Your cut feels warm to the touch.  You have pus or a bad smell coming from the cut.  You have a fever or shaking chills.  You feel sick to your stomach (nauseous) or you throw up (vomit).  You are dizzy.  Your stitches or staples come undone. Get help right away if:  You have a red streak coming from your cut.  Your cut bleeds through the bandage and the bleeding does not stop with gentle pressure.  The edges of your cut open up and separate.  You have very bad (severe) pain.  You have a rash.  You are confused.  You pass out (faint).  You have trouble breathing and you have a fast heartbeat. This information is not intended to replace advice given to you by your health care provider. Make sure you discuss any questions you have with your health care provider. Document Released: 03/23/2011 Document Revised: 05/18/2016 Document Reviewed: 09/06/2015 Elsevier Patient Education  2020 Elsevier Inc.  Hidradenitis Suppurativa Hidradenitis suppurativa is a long-term (chronic) skin disease. It is similar to a severe form of acne, but it affects areas of the body where acne would be unusual, especially areas of the body where skin rubs against skin and becomes moist. These include:  Underarms.  Groin.  Genital area.  Buttocks.  Upper thighs.  Breasts. Hidradenitis suppurativa may start out as small lumps or pimples caused by blocked sweat glands or hair follicles. Pimples may develop into deep sores that break open (rupture) and drain pus. Over time, affected areas of skin may thicken and become scarred. This condition is rare and does not spread from person to person (non-contagious). What are the causes? The exact cause of this condition is not known. It may be related to:  Female and female hormones.  An overactive disease-fighting system (immune system). The immune  system may over-react to blocked hair follicles or sweat glands and cause swelling and pus-filled sores. What increases the risk? You are more likely to develop this condition if you:  Are female.  Are 69-56 years old.  Have a family history of hidradenitis suppurativa.  Have a personal history of acne.  Are overweight.  Smoke.  Take the medicine lithium. What are the signs or symptoms? The first symptoms are usually painful bumps in the skin, similar to pimples. The condition may get worse over time (progress), or it may only cause mild symptoms. If the disease  progresses, symptoms may include:  Skin bumps getting bigger and growing deeper into the skin.  Bumps rupturing and draining pus.  Itchy, infected skin.  Skin getting thicker and scarred.  Tunnels under the skin (fistulas) where pus drains from a bump.  Pain during daily activities, such as pain during walking if your groin area is affected.  Emotional problems, such as stress or depression. This condition may affect your appearance and your ability or willingness to wear certain clothes or do certain activities. How is this diagnosed? This condition is diagnosed by a health care provider who specializes in skin diseases (dermatologist). You may be diagnosed based on:  Your symptoms and medical history.  A physical exam.  Testing a pus sample for infection.  Blood tests. How is this treated? Your treatment will depend on how severe your symptoms are. The same treatment will not work for everybody with this condition. You may need to try several treatments to find what works best for you. Treatment may include:  Cleaning and bandaging (dressing) your wounds as needed.  Lifestyle changes, such as new skin care routines.  Taking medicines, such as: ? Antibiotics. ? Acne medicines. ? Medicines to reduce the activity of the immune system. ? A diabetes medicine (metformin). ? Birth control pills, for  women. ? Steroids to reduce swelling and pain.  Working with a mental health care provider, if you experience emotional distress due to this condition. If you have severe symptoms that do not get better with medicine, you may need surgery. Surgery may involve:  Using a laser to clear the skin and remove hair follicles.  Opening and draining deep sores.  Removing the areas of skin that are diseased and scarred. Follow these instructions at home: Medicines   Take over-the-counter and prescription medicines only as told by your health care provider.  If you were prescribed an antibiotic medicine, take it as told by your health care provider. Do not stop taking the antibiotic even if your condition improves. Skin care  If you have open wounds, cover them with a clean dressing as told by your health care provider. Keep wounds clean by washing them gently with soap and water when you bathe.  Do not shave the areas where you get hidradenitis suppurativa.  Do not wear deodorant.  Wear loose-fitting clothes.  Try to avoid getting overheated or sweaty. If you get sweaty or wet, change into clean, dry clothes as soon as you can.  To help relieve pain and itchiness, cover sore areas with a warm, clean washcloth (warm compress) for 5-10 minutes as often as needed.  If told by your health care provider, take a bleach bath twice a week: ? Fill your bathtub halfway with water. ? Pour in  cup of unscented household bleach. ? Soak in the tub for 5-10 minutes. ? Only soak from the neck down. Avoid water on your face and hair. ? Shower to rinse off the bleach from your skin. General instructions  Learn as much as you can about your disease so that you have an active role in your treatment. Work closely with your health care provider to find treatments that work for you.  If you are overweight, work with your health care provider to lose weight as recommended.  Do not use any products that  contain nicotine or tobacco, such as cigarettes and e-cigarettes. If you need help quitting, ask your health care provider.  If you struggle with living with this condition, talk with  your health care provider or work with a mental health care provider as recommended.  Keep all follow-up visits as told by your health care provider. This is important. Where to find more information  Hidradenitis Suppurativa Foundation, Inc.: https://www.hs-foundation.org/ Contact a health care provider if you have:  A flare-up of hidradenitis suppurativa.  A fever or chills.  Trouble controlling your symptoms at home.  Trouble doing your daily activities because of your symptoms.  Trouble dealing with emotional problems related to your condition. Summary  Hidradenitis suppurativa is a long-term (chronic) skin disease. It is similar to a severe form of acne, but it affects areas of the body where acne would be unusual.  The first symptoms are usually painful bumps in the skin, similar to pimples. The condition may get worse over time (progress), or it may only cause mild symptoms.  If you have open wounds, cover them with a clean dressing as told by your health care provider. Keep wounds clean by washing them gently with soap and water when you bathe.  Besides skin care, treatment may include medicines, laser treatment, and surgery. This information is not intended to replace advice given to you by your health care provider. Make sure you discuss any questions you have with your health care provider. Document Released: 08/13/2003 Document Revised: 01/06/2017 Document Reviewed: 01/06/2017 Elsevier Patient Education  2020 [employer] How to Use Chlorhexidine for Bathing Chlorhexidine gluconate (CHG) is a germ-killing (antiseptic) solution that is used to clean the skin. It can get rid of the bacteria that normally live on the skin and can keep them away for about 24 hours. To clean your skin with CHG,  you may be given:  A CHG solution to use in the shower or as part of a sponge bath.  A prepackaged cloth that contains CHG. Cleaning your skin with CHG may help lower the risk for infection:  While you are staying in the intensive care unit of the hospital.  If you have a vascular access, such as a central line, to provide short-term or long-term access to your veins.  If you have a catheter to drain urine from your bladder.  If you are on a ventilator. A ventilator is a machine that helps you breathe by moving air in and out of your lungs.  After surgery. What are the risks? Risks of using CHG include:  A skin reaction.  Hearing loss, if CHG gets in your ears.  Eye injury, if CHG gets in your eyes and is not rinsed out.  The CHG product catching fire. Make sure that you avoid smoking and flames after applying CHG to your skin. Do not use CHG:  If you have a chlorhexidine allergy or have previously reacted to chlorhexidine.  On babies younger than 132 months of age. How to use CHG solution  Use CHG only as told by your health care provider, and follow the instructions on the label.  Use the full amount of CHG as directed. Usually, this is one bottle. During a shower Follow these steps when using CHG solution during a shower (unless your health care provider gives you different instructions): 1. Start the shower. 2. Use your normal soap and shampoo to wash your face and hair. 3. Turn off the shower or move out of the shower stream. 4. Pour the CHG onto a clean washcloth. Do not use any type of brush or rough-edged sponge. 5. Starting at your neck, lather your body down to your toes. Make  sure you follow these instructions: ? If you will be having surgery, pay special attention to the part of your body where you will be having surgery. Scrub this area for at least 1 minute. ? Do not use CHG on your head or face. If the solution gets into your ears or eyes, rinse them well  with water. ? Avoid your genital area. ? Avoid any areas of skin that have broken skin, cuts, or scrapes. ? Scrub your back and under your arms. Make sure to wash skin folds. 6. Let the lather sit on your skin for 1-2 minutes or as long as told by your health care provider. 7. Thoroughly rinse your entire body in the shower. Make sure that all body creases and crevices are rinsed well. 8. Dry off with a clean towel. Do not put any substances on your body afterward--such as powder, lotion, or perfume--unless you are told to do so by your health care provider. Only use lotions that are recommended by the manufacturer. 9. Put on clean clothes or pajamas. 10. If it is the night before your surgery, sleep in clean sheets.  During a sponge bath Follow these steps when using CHG solution during a sponge bath (unless your health care provider gives you different instructions): 1. Use your normal soap and shampoo to wash your face and hair. 2. Pour the CHG onto a clean washcloth. 3. Starting at your neck, lather your body down to your toes. Make sure you follow these instructions: ? If you will be having surgery, pay special attention to the part of your body where you will be having surgery. Scrub this area for at least 1 minute. ? Do not use CHG on your head or face. If the solution gets into your ears or eyes, rinse them well with water. ? Avoid your genital area. ? Avoid any areas of skin that have broken skin, cuts, or scrapes. ? Scrub your back and under your arms. Make sure to wash skin folds. 4. Let the lather sit on your skin for 1-2 minutes or as long as told by your health care provider. 5. Using a different clean, wet washcloth, thoroughly rinse your entire body. Make sure that all body creases and crevices are rinsed well. 6. Dry off with a clean towel. Do not put any substances on your body afterward--such as powder, lotion, or perfume--unless you are told to do so by your health care  provider. Only use lotions that are recommended by the manufacturer. 7. Put on clean clothes or pajamas. 8. If it is the night before your surgery, sleep in clean sheets. How to use CHG prepackaged cloths  Only use CHG cloths as told by your health care provider, and follow the instructions on the label.  Use the CHG cloth on clean, dry skin.  Do not use the CHG cloth on your head or face unless your health care provider tells you to.  When washing with the CHG cloth: ? Avoid your genital area. ? Avoid any areas of skin that have broken skin, cuts, or scrapes. Before surgery Follow these steps when using a CHG cloth to clean before surgery (unless your health care provider gives you different instructions): 1. Using the CHG cloth, vigorously scrub the part of your body where you will be having surgery. Scrub using a back-and-forth motion for 3 minutes. The area on your body should be completely wet with CHG when you are done scrubbing. 2. Do not rinse. Discard the  cloth and let the area air-dry. Do not put any substances on the area afterward, such as powder, lotion, or perfume. 3. Put on clean clothes or pajamas. 4. If it is the night before your surgery, sleep in clean sheets.  For general bathing Follow these steps when using CHG cloths for general bathing (unless your health care provider gives you different instructions). 1. Use a separate CHG cloth for each area of your body. Make sure you wash between any folds of skin and between your fingers and toes. Wash your body in the following order, switching to a new cloth after each step: ? The front of your neck, shoulders, and chest. ? Both of your arms, under your arms, and your hands. ? Your stomach and groin area, avoiding the genitals. ? Your right leg and foot. ? Your left leg and foot. ? The back of your neck, your back, and your buttocks. 2. Do not rinse. Discard the cloth and let the area air-dry. Do not put any substances on  your body afterward--such as powder, lotion, or perfume--unless you are told to do so by your health care provider. Only use lotions that are recommended by the manufacturer. 3. Put on clean clothes or pajamas. Contact a health care provider if:  Your skin gets irritated after scrubbing.  You have questions about using your solution or cloth. Get help right away if:  Your eyes become very red or swollen.  Your eyes itch badly.  Your skin itches badly and is red or swollen.  Your hearing changes.  You have trouble seeing.  You have swelling or tingling in your mouth or throat.  You have trouble breathing.  You swallow any chlorhexidine. Summary  Chlorhexidine gluconate (CHG) is a germ-killing (antiseptic) solution that is used to clean the skin. Cleaning your skin with CHG may help to lower your risk for infection.  You may be given CHG to use for bathing. It may be in a bottle or in a prepackaged cloth to use on your skin. Carefully follow your health care provider's instructions and the instructions on the product label.  Do not use CHG if you have a chlorhexidine allergy.  Contact your health care provider if your skin gets irritated after scrubbing. This information is not intended to replace advice given to you by your health care provider. Make sure you discuss any questions you have with your health care provider. Document Released: 09/23/2011 Document Revised: 03/17/2018 Document Reviewed: 11/26/2016 Elsevier Patient Education  2020 [employer].

## 2018-09-06 ENCOUNTER — Encounter (HOSPITAL_COMMUNITY): Payer: Self-pay

## 2018-09-06 ENCOUNTER — Other Ambulatory Visit (HOSPITAL_COMMUNITY)
Admission: RE | Admit: 2018-09-06 | Discharge: 2018-09-06 | Disposition: A | Discharge status: Home / Self Care | Payer: BC Managed Care – PPO | Source: Ambulatory Visit | Attending: General Surgery | Admitting: General Surgery

## 2018-09-06 ENCOUNTER — Other Ambulatory Visit: Payer: Self-pay

## 2018-09-06 ENCOUNTER — Encounter (HOSPITAL_COMMUNITY)
Admission: RE | Admit: 2018-09-06 | Discharge: 2018-09-06 | Disposition: A | Discharge status: Home / Self Care | Payer: BC Managed Care – PPO | Source: Ambulatory Visit | Attending: General Surgery | Admitting: General Surgery

## 2018-09-06 DIAGNOSIS — Z20828 Contact with and (suspected) exposure to other viral communicable diseases: Secondary | ICD-10-CM | POA: Insufficient documentation

## 2018-09-06 DIAGNOSIS — Z01812 Encounter for preprocedural laboratory examination: Secondary | ICD-10-CM | POA: Insufficient documentation

## 2018-09-06 HISTORY — DX: Anxiety disorder, unspecified: F41.9

## 2018-09-06 HISTORY — DX: Hypothyroidism, unspecified: E03.9

## 2018-09-06 LAB — SARS CORONAVIRUS 2 (TAT 6-24 HRS): SARS Coronavirus 2: NEGATIVE

## 2018-09-06 LAB — HCG, SERUM, QUALITATIVE: Preg, Serum: NEGATIVE

## 2018-09-07 ENCOUNTER — Other Ambulatory Visit (HOSPITAL_COMMUNITY): Payer: BC Managed Care – PPO

## 2018-09-09 ENCOUNTER — Ambulatory Visit (HOSPITAL_COMMUNITY): Payer: BC Managed Care – PPO | Admitting: Anesthesiology

## 2018-09-09 ENCOUNTER — Other Ambulatory Visit: Payer: Self-pay

## 2018-09-09 ENCOUNTER — Encounter (HOSPITAL_COMMUNITY): Payer: Self-pay | Admitting: *Deleted

## 2018-09-09 ENCOUNTER — Ambulatory Visit (HOSPITAL_COMMUNITY)
Admission: RE | Admit: 2018-09-09 | Discharge: 2018-09-09 | Disposition: A | Discharge status: Home / Self Care | Payer: BC Managed Care – PPO | Attending: General Surgery | Admitting: General Surgery

## 2018-09-09 ENCOUNTER — Encounter (HOSPITAL_COMMUNITY)
Admission: RE | Disposition: A | Discharge status: Home / Self Care | Payer: Self-pay | Source: Home / Self Care | Attending: General Surgery

## 2018-09-09 DIAGNOSIS — Z820 Family history of epilepsy and other diseases of the nervous system: Secondary | ICD-10-CM | POA: Diagnosis not present

## 2018-09-09 DIAGNOSIS — Z825 Family history of asthma and other chronic lower respiratory diseases: Secondary | ICD-10-CM | POA: Diagnosis not present

## 2018-09-09 DIAGNOSIS — K219 Gastro-esophageal reflux disease without esophagitis: Secondary | ICD-10-CM | POA: Diagnosis not present

## 2018-09-09 DIAGNOSIS — Z818 Family history of other mental and behavioral disorders: Secondary | ICD-10-CM | POA: Insufficient documentation

## 2018-09-09 DIAGNOSIS — F17200 Nicotine dependence, unspecified, uncomplicated: Secondary | ICD-10-CM | POA: Insufficient documentation

## 2018-09-09 DIAGNOSIS — Z885 Allergy status to narcotic agent status: Secondary | ICD-10-CM | POA: Insufficient documentation

## 2018-09-09 DIAGNOSIS — Z79899 Other long term (current) drug therapy: Secondary | ICD-10-CM | POA: Insufficient documentation

## 2018-09-09 DIAGNOSIS — Z8249 Family history of ischemic heart disease and other diseases of the circulatory system: Secondary | ICD-10-CM | POA: Insufficient documentation

## 2018-09-09 DIAGNOSIS — F419 Anxiety disorder, unspecified: Secondary | ICD-10-CM | POA: Diagnosis not present

## 2018-09-09 DIAGNOSIS — Z9049 Acquired absence of other specified parts of digestive tract: Secondary | ICD-10-CM | POA: Diagnosis not present

## 2018-09-09 DIAGNOSIS — Z833 Family history of diabetes mellitus: Secondary | ICD-10-CM | POA: Insufficient documentation

## 2018-09-09 DIAGNOSIS — Z823 Family history of stroke: Secondary | ICD-10-CM | POA: Insufficient documentation

## 2018-09-09 DIAGNOSIS — Z881 Allergy status to other antibiotic agents status: Secondary | ICD-10-CM | POA: Insufficient documentation

## 2018-09-09 DIAGNOSIS — F17210 Nicotine dependence, cigarettes, uncomplicated: Secondary | ICD-10-CM | POA: Diagnosis not present

## 2018-09-09 DIAGNOSIS — Z880 Allergy status to penicillin: Secondary | ICD-10-CM | POA: Diagnosis not present

## 2018-09-09 DIAGNOSIS — Z793 Long term (current) use of hormonal contraceptives: Secondary | ICD-10-CM | POA: Insufficient documentation

## 2018-09-09 DIAGNOSIS — Z888 Allergy status to other drugs, medicaments and biological substances status: Secondary | ICD-10-CM | POA: Insufficient documentation

## 2018-09-09 DIAGNOSIS — Z801 Family history of malignant neoplasm of trachea, bronchus and lung: Secondary | ICD-10-CM | POA: Diagnosis not present

## 2018-09-09 DIAGNOSIS — L732 Hidradenitis suppurativa: Secondary | ICD-10-CM

## 2018-09-09 DIAGNOSIS — F909 Attention-deficit hyperactivity disorder, unspecified type: Secondary | ICD-10-CM | POA: Diagnosis not present

## 2018-09-09 DIAGNOSIS — E039 Hypothyroidism, unspecified: Secondary | ICD-10-CM | POA: Diagnosis not present

## 2018-09-09 HISTORY — PX: HYDRADENITIS EXCISION: SHX5243

## 2018-09-09 SURGERY — EXCISION, HIDRADENITIS, AXILLA
Anesthesia: General | Laterality: Left

## 2018-09-09 MED ORDER — CHLORHEXIDINE GLUCONATE CLOTH 2 % EX PADS: 6.0000 | MEDICATED_PAD | Freq: Once | CUTANEOUS | Status: DC

## 2018-09-09 MED ORDER — LIDOCAINE 2% (20 MG/ML) 5 ML SYRINGE
INTRAMUSCULAR | Status: AC
  Filled 2018-09-09: qty 5

## 2018-09-09 MED ORDER — LACTATED RINGERS IV SOLN
INTRAVENOUS | Status: DC | PRN
  Administered 2018-09-09: 07:00:00 via INTRAVENOUS

## 2018-09-09 MED ORDER — KETOROLAC TROMETHAMINE 30 MG/ML IJ SOLN
30.0000 mg | Freq: Once | INTRAMUSCULAR | Status: AC
  Administered 2018-09-09: 30 mg via INTRAVENOUS
  Filled 2018-09-09: qty 1

## 2018-09-09 MED ORDER — FENTANYL CITRATE (PF) 100 MCG/2ML IJ SOLN
INTRAMUSCULAR | Status: AC
  Filled 2018-09-09: qty 2

## 2018-09-09 MED ORDER — DIPHENHYDRAMINE HCL 50 MG/ML IJ SOLN
INTRAMUSCULAR | Status: AC
  Filled 2018-09-09: qty 1

## 2018-09-09 MED ORDER — DEXAMETHASONE SODIUM PHOSPHATE 10 MG/ML IJ SOLN
INTRAMUSCULAR | Status: AC
  Filled 2018-09-09: qty 1

## 2018-09-09 MED ORDER — HYDROMORPHONE HCL 1 MG/ML IJ SOLN
INTRAMUSCULAR | Status: AC
  Filled 2018-09-09: qty 0.5

## 2018-09-09 MED ORDER — POVIDONE-IODINE 10 % OINT PACKET
TOPICAL_OINTMENT | CUTANEOUS | Status: DC | PRN
  Administered 2018-09-09: 1 via TOPICAL

## 2018-09-09 MED ORDER — SUCCINYLCHOLINE CHLORIDE 20 MG/ML IJ SOLN
INTRAMUSCULAR | Status: DC | PRN
  Administered 2018-09-09: 180 mg via INTRAVENOUS

## 2018-09-09 MED ORDER — POVIDONE-IODINE 10 % EX OINT
TOPICAL_OINTMENT | CUTANEOUS | Status: AC
  Filled 2018-09-09: qty 1

## 2018-09-09 MED ORDER — BUPIVACAINE HCL (PF) 0.5 % IJ SOLN
INTRAMUSCULAR | Status: DC | PRN
  Administered 2018-09-09: 10 mL

## 2018-09-09 MED ORDER — MIDAZOLAM HCL 2 MG/2ML IJ SOLN
2.0000 mg | Freq: Once | INTRAMUSCULAR | Status: AC
  Administered 2018-09-09: 2 mg via INTRAVENOUS

## 2018-09-09 MED ORDER — PROPOFOL 10 MG/ML IV BOLUS
INTRAVENOUS | Status: AC
  Filled 2018-09-09: qty 40

## 2018-09-09 MED ORDER — MIDAZOLAM HCL 2 MG/2ML IJ SOLN
INTRAMUSCULAR | Status: AC
  Filled 2018-09-09: qty 2

## 2018-09-09 MED ORDER — VANCOMYCIN HCL 10 G IV SOLR
1500.0000 mg | INTRAVENOUS | Status: AC
  Administered 2018-09-09: 1500 mg via INTRAVENOUS
  Filled 2018-09-09: qty 1500

## 2018-09-09 MED ORDER — GLYCOPYRROLATE PF 0.2 MG/ML IJ SOSY
PREFILLED_SYRINGE | INTRAMUSCULAR | Status: AC
  Filled 2018-09-09: qty 1

## 2018-09-09 MED ORDER — FENTANYL CITRATE (PF) 100 MCG/2ML IJ SOLN: 25.0000 ug | INTRAMUSCULAR | Status: DC | PRN

## 2018-09-09 MED ORDER — ROCURONIUM BROMIDE 50 MG/5ML IV SOSY
PREFILLED_SYRINGE | INTRAVENOUS | Status: DC | PRN
  Administered 2018-09-09: 30 mg via INTRAVENOUS

## 2018-09-09 MED ORDER — LACTATED RINGERS IV SOLN
Freq: Once | INTRAVENOUS | Status: AC
  Administered 2018-09-09: 07:00:00 via INTRAVENOUS

## 2018-09-09 MED ORDER — PROPOFOL 10 MG/ML IV BOLUS
INTRAVENOUS | Status: DC | PRN
  Administered 2018-09-09: 200 mg via INTRAVENOUS

## 2018-09-09 MED ORDER — HYDROMORPHONE HCL 1 MG/ML IJ SOLN
0.2500 mg | INTRAMUSCULAR | Status: DC | PRN
  Administered 2018-09-09 (×2): 0.5 mg via INTRAVENOUS

## 2018-09-09 MED ORDER — BUPIVACAINE HCL (PF) 0.5 % IJ SOLN
INTRAMUSCULAR | Status: AC
  Filled 2018-09-09: qty 30

## 2018-09-09 MED ORDER — SODIUM CHLORIDE 0.9 % IV SOLN
INTRAVENOUS | Status: DC | PRN
  Administered 2018-09-09: 08:00:00 via INTRAVENOUS

## 2018-09-09 MED ORDER — ONDANSETRON HCL 4 MG/2ML IJ SOLN
INTRAMUSCULAR | Status: AC
  Filled 2018-09-09: qty 2

## 2018-09-09 MED ORDER — ONDANSETRON HCL 4 MG/2ML IJ SOLN
INTRAMUSCULAR | Status: DC | PRN
  Administered 2018-09-09: 4 mg via INTRAVENOUS

## 2018-09-09 MED ORDER — DIPHENHYDRAMINE HCL 50 MG/ML IJ SOLN
50.0000 mg | Freq: Once | INTRAMUSCULAR | Status: AC
  Administered 2018-09-09: 50 mg via INTRAVENOUS

## 2018-09-09 MED ORDER — GLYCOPYRROLATE PF 0.2 MG/ML IJ SOSY
PREFILLED_SYRINGE | INTRAMUSCULAR | Status: DC | PRN
  Administered 2018-09-09: .2 mg via INTRAVENOUS

## 2018-09-09 MED ORDER — DEXAMETHASONE SODIUM PHOSPHATE 4 MG/ML IJ SOLN
INTRAMUSCULAR | Status: DC | PRN
  Administered 2018-09-09: 8 mg via INTRAVENOUS

## 2018-09-09 MED ORDER — 0.9 % SODIUM CHLORIDE (POUR BTL) OPTIME
TOPICAL | Status: DC | PRN
  Administered 2018-09-09: 1000 mL

## 2018-09-09 MED ORDER — FENTANYL CITRATE (PF) 100 MCG/2ML IJ SOLN
INTRAMUSCULAR | Status: DC | PRN
  Administered 2018-09-09 (×3): 50 ug via INTRAVENOUS

## 2018-09-09 MED ORDER — ARTIFICIAL TEARS OPHTHALMIC OINT
TOPICAL_OINTMENT | OPHTHALMIC | Status: AC
  Filled 2018-09-09: qty 7

## 2018-09-09 MED ORDER — TRAMADOL HCL 50 MG PO TABS: 50.0000 mg | ORAL_TABLET | Freq: Four times a day (QID) | ORAL | 0 refills | Status: DC | PRN

## 2018-09-09 MED ORDER — ONDANSETRON HCL 4 MG/2ML IJ SOLN: 4.0000 mg | Freq: Once | INTRAMUSCULAR | Status: DC | PRN

## 2018-09-09 MED ORDER — LIDOCAINE 2% (20 MG/ML) 5 ML SYRINGE
INTRAMUSCULAR | Status: DC | PRN
  Administered 2018-09-09: 40 mg via INTRAVENOUS

## 2018-09-09 MED ORDER — MEPERIDINE HCL 50 MG/ML IJ SOLN
6.2500 mg | INTRAMUSCULAR | Status: DC | PRN
  Administered 2018-09-09 (×2): 12.5 mg via INTRAVENOUS
  Filled 2018-09-09: qty 1

## 2018-09-09 SURGICAL SUPPLY — 29 items
CHLORAPREP W/TINT 26 (MISCELLANEOUS) ×2 IMPLANT
CLOTH BEACON ORANGE TIMEOUT ST (SAFETY) ×2 IMPLANT
COVER LIGHT HANDLE STERIS (MISCELLANEOUS) ×4 IMPLANT
DECANTER SPIKE VIAL GLASS SM (MISCELLANEOUS) ×2 IMPLANT
ELECT REM PT RETURN 9FT ADLT (ELECTROSURGICAL) ×2
ELECTRODE REM PT RTRN 9FT ADLT (ELECTROSURGICAL) ×1 IMPLANT
GAUZE SPONGE 4X4 12PLY STRL (GAUZE/BANDAGES/DRESSINGS) ×1 IMPLANT
GLOVE BIOGEL PI IND STRL 7.0 (GLOVE) ×1 IMPLANT
GLOVE BIOGEL PI INDICATOR 7.0 (GLOVE) ×2
GLOVE ECLIPSE 6.5 STRL STRAW (GLOVE) ×1 IMPLANT
GLOVE SURG SS PI 7.5 STRL IVOR (GLOVE) ×3 IMPLANT
GOWN STRL REUS W/ TWL LRG LVL3 (GOWN DISPOSABLE) ×1 IMPLANT
GOWN STRL REUS W/ TWL XL LVL3 (GOWN DISPOSABLE) ×1 IMPLANT
GOWN STRL REUS W/TWL LRG LVL3 (GOWN DISPOSABLE) ×1
GOWN STRL REUS W/TWL XL LVL3 (GOWN DISPOSABLE) ×1
INST SET MINOR GENERAL (KITS) ×2 IMPLANT
KIT TURNOVER KIT A (KITS) ×2 IMPLANT
MANIFOLD NEPTUNE II (INSTRUMENTS) ×2 IMPLANT
NDL HYPO 25X1 1.5 SAFETY (NEEDLE) ×1 IMPLANT
NEEDLE HYPO 25X1 1.5 SAFETY (NEEDLE) ×2 IMPLANT
NS IRRIG 1000ML POUR BTL (IV SOLUTION) ×2 IMPLANT
PACK MINOR (CUSTOM PROCEDURE TRAY) ×2 IMPLANT
PAD ARMBOARD 7.5X6 YLW CONV (MISCELLANEOUS) ×2 IMPLANT
SET BASIN LINEN APH (SET/KITS/TRAYS/PACK) ×2 IMPLANT
SUT ETHILON 3 0 FSL (SUTURE) ×2 IMPLANT
SUT VIC AB 3-0 SH 27 (SUTURE) ×2
SUT VIC AB 3-0 SH 27X BRD (SUTURE) IMPLANT
SYR CONTROL 10ML LL (SYRINGE) ×2 IMPLANT
TAPE CLOTH SURG 4X10 WHT LF (GAUZE/BANDAGES/DRESSINGS) ×1 IMPLANT

## 2018-09-09 NOTE — Anesthesia Postprocedure Evaluation (Signed)
Anesthesia Post Note  Patient: Kathleen Velasquez  Procedure(s) Performed: EXCISION HIDRADENITIS AXILLA, LEFT (Left )  Patient location during evaluation: PACU Anesthesia Type: General Level of consciousness: awake and alert and oriented Pain management: pain level controlled Vital Signs Assessment: post-procedure vital signs reviewed and stable Respiratory status: spontaneous breathing Cardiovascular status: stable Postop Assessment: no apparent nausea or vomiting Anesthetic complications: no     Last Vitals:  Vitals:   09/09/18 0640  BP: 123/84  Pulse: 76  Resp: 20  Temp: 36.7 C  SpO2: 98%    Last Pain:  Vitals:   09/09/18 0640  TempSrc: Oral  PainSc: 3                  ADAMS, AMY A

## 2018-09-09 NOTE — Progress Notes (Signed)
Vancomycin complete, patient tolerating po fluids, generalized itching resolved.

## 2018-09-09 NOTE — Interval H&P Note (Signed)
History and Physical Interval Note:  09/09/2018 7:16 AM  Carolanne Grumbling  has presented today for surgery, with the diagnosis of hidradenitis, left axilla.  The various methods of treatment have been discussed with the patient and family. After consideration of risks, benefits and other options for treatment, the patient has consented to  Procedure(s): EXCISION HIDRADENITIS AXILLA, LEFT (Left) as a surgical intervention.  The patient's history has been reviewed, patient examined, no change in status, stable for surgery.  I have reviewed the patient's chart and labs.  Questions were answered to the patient's satisfaction.     Aviva Signs

## 2018-09-09 NOTE — Anesthesia Procedure Notes (Signed)
Procedure Name: Intubation Date/Time: 09/09/2018 7:32 AM Performed by: Andree Elk, Alekzander Cardell A, CRNA Pre-anesthesia Checklist: Patient identified, Patient being monitored, Timeout performed, Emergency Drugs available and Suction available Patient Re-evaluated:Patient Re-evaluated prior to induction Oxygen Delivery Method: Circle system utilized Preoxygenation: Pre-oxygenation with 100% oxygen Induction Type: IV induction Laryngoscope Size: 3 and Glidescope Grade View: Grade I Tube type: Oral Tube size: 7.0 mm Number of attempts: 1 Airway Equipment and Method: Stylet Placement Confirmation: ETT inserted through vocal cords under direct vision,  positive ETCO2 and breath sounds checked- equal and bilateral Secured at: 21 cm Tube secured with: Tape Dental Injury: Teeth and Oropharynx as per pre-operative assessment

## 2018-09-09 NOTE — Transfer of Care (Signed)
Immediate Anesthesia Transfer of Care Note  Patient: Kathleen Velasquez  Procedure(s) Performed: EXCISION HIDRADENITIS AXILLA, LEFT (Left )  Patient Location: PACU  Anesthesia Type:General  Level of Consciousness: awake, oriented and patient cooperative  Airway & Oxygen Therapy: Patient Spontanous Breathing  Post-op Assessment: Report given to RN and Post -op Vital signs reviewed and stable  Post vital signs: Reviewed and stable  Last Vitals:  Vitals Value Taken Time  BP 131/73 09/09/18 0820  Temp    Pulse 88 09/09/18 0821  Resp 17 09/09/18 0821  SpO2 98 % 09/09/18 0821  Vitals shown include unvalidated device data.  Last Pain:  Vitals:   09/09/18 0640  TempSrc: Oral  PainSc: 3       Patients Stated Pain Goal: 8 (38/25/05 3976)  Complications: No apparent anesthesia complications

## 2018-09-09 NOTE — Discharge Instructions (Signed)
Hidradenitis Suppurativa Hidradenitis suppurativa is a long-term (chronic) skin disease. It is similar to a severe form of acne, but it affects areas of the body where acne would be unusual, especially areas of the body where skin rubs against skin and becomes moist. These include:  Underarms.  Groin.  Genital area.  Buttocks.  Upper thighs.  Breasts. Hidradenitis suppurativa may start out as small lumps or pimples caused by blocked sweat glands or hair follicles. Pimples may develop into deep sores that break open (rupture) and drain pus. Over time, affected areas of skin may thicken and become scarred. This condition is rare and does not spread from person to person (non-contagious). What are the causes? The exact cause of this condition is not known. It may be related to:  Female and female hormones.  An overactive disease-fighting system (immune system). The immune system may over-react to blocked hair follicles or sweat glands and cause swelling and pus-filled sores. What increases the risk? You are more likely to develop this condition if you:  Are female.  Are 32-26 years old.  Have a family history of hidradenitis suppurativa.  Have a personal history of acne.  Are overweight.  Smoke.  Take the medicine lithium. What are the signs or symptoms? The first symptoms are usually painful bumps in the skin, similar to pimples. The condition may get worse over time (progress), or it may only cause mild symptoms. If the disease progresses, symptoms may include:  Skin bumps getting bigger and growing deeper into the skin.  Bumps rupturing and draining pus.  Itchy, infected skin.  Skin getting thicker and scarred.  Tunnels under the skin (fistulas) where pus drains from a bump.  Pain during daily activities, such as pain during walking if your groin area is affected.  Emotional problems, such as stress or depression. This condition may affect your appearance and your  ability or willingness to wear certain clothes or do certain activities. How is this diagnosed? This condition is diagnosed by a health care provider who specializes in skin diseases (dermatologist). You may be diagnosed based on:  Your symptoms and medical history.  A physical exam.  Testing a pus sample for infection.  Blood tests. How is this treated? Your treatment will depend on how severe your symptoms are. The same treatment will not work for everybody with this condition. You may need to try several treatments to find what works best for you. Treatment may include:  Cleaning and bandaging (dressing) your wounds as needed.  Lifestyle changes, such as new skin care routines.  Taking medicines, such as: ? Antibiotics. ? Acne medicines. ? Medicines to reduce the activity of the immune system. ? A diabetes medicine (metformin). ? Birth control pills, for women. ? Steroids to reduce swelling and pain.  Working with a mental health care provider, if you experience emotional distress due to this condition. If you have severe symptoms that do not get better with medicine, you may need surgery. Surgery may involve:  Using a laser to clear the skin and remove hair follicles.  Opening and draining deep sores.  Removing the areas of skin that are diseased and scarred. Follow these instructions at home: Medicines   Take over-the-counter and prescription medicines only as told by your health care provider.  If you were prescribed an antibiotic medicine, take it as told by your health care provider. Do not stop taking the antibiotic even if your condition improves. Skin care  If you have open wounds, cover  them with a clean dressing as told by your health care provider. Keep wounds clean by washing them gently with soap and water when you bathe.  Do not shave the areas where you get hidradenitis suppurativa.  Do not wear deodorant.  Wear loose-fitting clothes.  Try to avoid  getting overheated or sweaty. If you get sweaty or wet, change into clean, dry clothes as soon as you can.  To help relieve pain and itchiness, cover sore areas with a warm, clean washcloth (warm compress) for 5-10 minutes as often as needed.  If told by your health care provider, take a bleach bath twice a week: ? Fill your bathtub halfway with water. ? Pour in  cup of unscented household bleach. ? Soak in the tub for 5-10 minutes. ? Only soak from the neck down. Avoid water on your face and hair. ? Shower to rinse off the bleach from your skin. General instructions  Learn as much as you can about your disease so that you have an active role in your treatment. Work closely with your health care provider to find treatments that work for you.  If you are overweight, work with your health care provider to lose weight as recommended.  Do not use any products that contain nicotine or tobacco, such as cigarettes and e-cigarettes. If you need help quitting, ask your health care provider.  If you struggle with living with this condition, talk with your health care provider or work with a mental health care provider as recommended.  Keep all follow-up visits as told by your health care provider. This is important. Where to find more information  Hidradenitis [place].: [email or web address]-[email or web address]/ Contact a health care provider if you have:  A flare-up of hidradenitis suppurativa.  A fever or chills.  Trouble controlling your symptoms at home.  Trouble doing your daily activities because of your symptoms.  Trouble dealing with emotional problems related to your condition. Summary  Hidradenitis suppurativa is a long-term (chronic) skin disease. It is similar to a severe form of acne, but it affects areas of the body where acne would be unusual.  The first symptoms are usually painful bumps in the skin, similar to pimples. The condition may get worse over time  (progress), or it may only cause mild symptoms.  If you have open wounds, cover them with a clean dressing as told by your health care provider. Keep wounds clean by washing them gently with soap and water when you bathe.  Besides skin care, treatment may include medicines, laser treatment, and surgery. This information is not intended to replace advice given to you by your health care provider. Make sure you discuss any questions you have with your health care provider. Document Released: 08/13/2003 Document Revised: 01/06/2017 Document Reviewed: 01/06/2017 Elsevier Patient Education  2020 [place] Anesthesia, Adult, Care After This sheet gives you information about how to care for yourself after your procedure. Your health care provider may also give you more specific instructions. If you have problems or questions, contact your health care provider. What can I expect after the procedure? After the procedure, the following side effects are common:  Pain or discomfort at the IV site.  Nausea.  Vomiting.  Sore throat.  Trouble concentrating.  Feeling cold or chills.  Weak or tired.  Sleepiness and fatigue.  Soreness and body aches. These side effects can affect parts of the body that were not involved in surgery. Follow these instructions at home:  For at least 24 hours after the procedure:  Have a responsible adult stay with you. It is important to have someone help care for you until you are awake and alert.  Rest as needed.  Do not: ? Participate in activities in which you could fall or become injured. ? Drive. ? Use heavy machinery. ? Drink alcohol. ? Take sleeping pills or medicines that cause drowsiness. ? Make important decisions or sign legal documents. ? Take care of children on your own. Eating and drinking  Follow any instructions from your health care provider about eating or drinking restrictions.  When you feel hungry, start by eating  small amounts of foods that are soft and easy to digest (bland), such as toast. Gradually return to your regular diet.  Drink enough fluid to keep your urine pale yellow.  If you vomit, rehydrate by drinking water, juice, or clear broth. General instructions  If you have sleep apnea, surgery and certain medicines can increase your risk for breathing problems. Follow instructions from your health care provider about wearing your sleep device: ? Anytime you are sleeping, including during daytime naps. ? While taking prescription pain medicines, sleeping medicines, or medicines that make you drowsy.  Return to your normal activities as told by your health care provider. Ask your health care provider what activities are safe for you.  Take over-the-counter and prescription medicines only as told by your health care provider.  If you smoke, do not smoke without supervision.  Keep all follow-up visits as told by your health care provider. This is important. Contact a health care provider if:  You have nausea or vomiting that does not get better with medicine.  You cannot eat or drink without vomiting.  You have pain that does not get better with medicine.  You are unable to pass urine.  You develop a skin rash.  You have a fever.  You have redness around your IV site that gets worse. Get help right away if:  You have difficulty breathing.  You have chest pain.  You have blood in your urine or stool, or you vomit blood. Summary  After the procedure, it is common to have a sore throat or nausea. It is also common to feel tired.  Have a responsible adult stay with you for the first 24 hours after general anesthesia. It is important to have someone help care for you until you are awake and alert.  When you feel hungry, start by eating small amounts of foods that are soft and easy to digest (bland), such as toast. Gradually return to your regular diet.  Drink enough fluid to keep  your urine pale yellow.  Return to your normal activities as told by your health care provider. Ask your health care provider what activities are safe for you. This information is not intended to replace advice given to you by your health care provider. Make sure you discuss any questions you have with your health care provider. Document Released: 04/06/2000 Document Revised: 01/01/2017 Document Reviewed: 08/14/2016 Elsevier Patient Education  2020 [employer]

## 2018-09-09 NOTE — Op Note (Signed)
Patient:  Kathleen Velasquez  DOB:  07/21/1990  MRN:  [id number]   Preop Diagnosis: Hidradenitis, left axilla  Postop Diagnosis: Same  Procedure: Excision of hidradenitis, left axilla  Surgeon: Aviva Signs, MD  Anes: General endotracheal  Indications: Patient is a 28 year old white female with a longstanding history of hidradenitis of the axillas, status post multiple procedures on both axillas who now presents with a chronic area of inflammation in the left axilla.  The risks and benefits of the procedure including bleeding, infection, wound breakdown, and the possibility of recurrence of the hidradenitis were fully explained to the patient, who gave informed consent.  Procedure note: The patient was placed in supine position.  After induction of general endotracheal anesthesia, the left axilla was prepped and draped using the usual sterile technique with ChloraPrep.  Surgical site confirmation was performed.  Multiple areas of scarring and chronic inflammation were noted in the left axilla.  I elected to proceed with an elliptical incision which was approximately 8 to 10 cm in length, including as many areas that I could.  Full-thickness skin excision down to the subcutaneous tissue was performed.  The specimen was sent to pathology further examination.  Flaps performed on either side of the skin in order to reapproximate the skin edges without tension.  3-0 Vicryl interrupted sutures were placed subcutaneously.  0.5% Sensorcaine was instilled into the surrounding wound.  The skin edges were reapproximated using a 3-0 nylon vertical mattress suture.  Betadine ointment and a dry sterile dressing were applied.  All tape and needle counts were correct at the end of the procedure.  The patient was extubated in the operating room and transferred to PACU in stable condition.  Complications: None  EBL: Minimal  Specimen: Left axillary hidradenitis

## 2018-09-09 NOTE — Anesthesia Preprocedure Evaluation (Signed)
Anesthesia Evaluation  Patient identified by MRN, date of birth, ID band Patient awake    Reviewed: Allergy & Precautions, NPO status , Patient's Chart, lab work & pertinent test results  Airway Mallampati: III  TM Distance: >3 FB Neck ROM: Full    Dental  (+) Edentulous Upper, Missing,    Pulmonary Current Smoker and Patient abstained from smoking.,    breath sounds clear to auscultation       Cardiovascular Normal cardiovascular exam Rhythm:Regular Rate:Normal     Neuro/Psych PSYCHIATRIC DISORDERS Anxiety    GI/Hepatic GERD (denied GERD)  ,  Endo/Other  Hypothyroidism (not on meds,as per patient her physician took her off meds) Morbid obesity  Renal/GU      Musculoskeletal   Abdominal (+) + obese,   Peds  (+) ADHD Hematology   Anesthesia Other Findings   Reproductive/Obstetrics                             Anesthesia Physical Anesthesia Plan  ASA: III  Anesthesia Plan: General   Post-op Pain Management:    Induction: Intravenous  PONV Risk Score and Plan: 3 and Ondansetron, Dexamethasone, Midazolam and Metaclopromide  Airway Management Planned: Oral ETT  Additional Equipment:   Intra-op Plan:   Post-operative Plan:   Informed Consent: I have reviewed the patients History and Physical, chart, labs and discussed the procedure including the risks, benefits and alternatives for the proposed anesthesia with the patient or authorized representative who has indicated his/her understanding and acceptance.       Plan Discussed with: CRNA  Anesthesia Plan Comments:         Anesthesia Quick Evaluation

## 2018-09-09 NOTE — Progress Notes (Signed)
Awake. Rates pain 6. Drinking sprite. C/O generalized itching. No redness, rash or hives present. Want med for itching. Dr Charna Elizabeth consulted. Order given for benadryl.

## 2018-09-12 ENCOUNTER — Telehealth: Payer: Self-pay

## 2018-09-12 ENCOUNTER — Other Ambulatory Visit (INDEPENDENT_AMBULATORY_CARE_PROVIDER_SITE_OTHER): Payer: BC Managed Care – PPO | Admitting: General Surgery

## 2018-09-12 ENCOUNTER — Encounter (HOSPITAL_COMMUNITY): Payer: Self-pay | Admitting: General Surgery

## 2018-09-12 DIAGNOSIS — Z09 Encounter for follow-up examination after completed treatment for conditions other than malignant neoplasm: Secondary | ICD-10-CM

## 2018-09-12 MED ORDER — HYDROCODONE-ACETAMINOPHEN 5-325 MG PO TABS: 1.0000 | ORAL_TABLET | ORAL | 0 refills | Status: AC | PRN

## 2018-09-12 NOTE — Progress Notes (Signed)
Patient not getting pain control with Tramadol.  Has had hydrocodone without problem.  Will prescribe.

## 2018-09-12 NOTE — Telephone Encounter (Signed)
Patient called stating that she was having pain in her incision and the pain meds were not helping. MD notified.

## 2018-09-20 ENCOUNTER — Telehealth: Payer: Self-pay

## 2018-09-20 NOTE — Telephone Encounter (Signed)
Patient called regarding surgical site, states that she is unable to a stitch on one end. No redness, some drainage. MD notified.

## 2018-09-22 ENCOUNTER — Other Ambulatory Visit: Payer: Self-pay

## 2018-09-22 ENCOUNTER — Ambulatory Visit (INDEPENDENT_AMBULATORY_CARE_PROVIDER_SITE_OTHER): Payer: Self-pay | Admitting: General Surgery

## 2018-09-22 ENCOUNTER — Encounter: Payer: Self-pay | Admitting: General Surgery

## 2018-09-22 VITALS — BP 137/85 | HR 91 | Temp 96.6°F | Resp 18 | Ht 64.0 in | Wt 262.0 lb

## 2018-09-22 DIAGNOSIS — Z09 Encounter for follow-up examination after completed treatment for conditions other than malignant neoplasm: Secondary | ICD-10-CM

## 2018-09-22 NOTE — Progress Notes (Signed)
Subjective:     Kathleen Velasquez  Here for postoperative visit.  Has had some minimal serosanguineous drainage from the midportion of her wound. Objective:    BP 137/85 (BP Location: Right Arm, Patient Position: Sitting, Cuff Size: Normal)   Pulse 91   Temp (!) 96.6 F (35.9 C) (Tympanic)   Resp 18   Ht 5\' 4"  (1.626 m)   Wt 262 lb (118.8 kg)   SpO2 96%   BMI 44.97 kg/m   General:  alert, cooperative and no distress  Left axillary incision healing well with only 1 small 5 mm area of wound separation which is superficial in nature.  Sutures were removed.  No evidence of infection.     Assessment:    Doing well postoperatively.    Plan:   Was told to keep the wound clean and dry with soap and water.  I will see her in 2 weeks for follow-up.

## 2018-09-30 DIAGNOSIS — M7541 Impingement syndrome of right shoulder: Secondary | ICD-10-CM | POA: Diagnosis not present

## 2018-09-30 DIAGNOSIS — Z4889 Encounter for other specified surgical aftercare: Secondary | ICD-10-CM | POA: Diagnosis not present

## 2018-09-30 DIAGNOSIS — I9789 Other postprocedural complications and disorders of the circulatory system, not elsewhere classified: Secondary | ICD-10-CM | POA: Diagnosis not present

## 2018-09-30 DIAGNOSIS — M25511 Pain in right shoulder: Secondary | ICD-10-CM | POA: Diagnosis not present

## 2018-09-30 DIAGNOSIS — G8918 Other acute postprocedural pain: Secondary | ICD-10-CM | POA: Diagnosis not present

## 2018-10-06 ENCOUNTER — Other Ambulatory Visit: Payer: Self-pay

## 2018-10-06 ENCOUNTER — Encounter: Payer: Self-pay | Admitting: General Surgery

## 2018-10-06 ENCOUNTER — Ambulatory Visit (INDEPENDENT_AMBULATORY_CARE_PROVIDER_SITE_OTHER): Payer: Self-pay | Admitting: General Surgery

## 2018-10-06 VITALS — BP 122/85 | HR 79 | Temp 97.7°F | Resp 18 | Ht 64.0 in | Wt 264.0 lb

## 2018-10-06 DIAGNOSIS — Z09 Encounter for follow-up examination after completed treatment for conditions other than malignant neoplasm: Secondary | ICD-10-CM

## 2018-10-06 NOTE — Progress Notes (Signed)
Subjective:     Carolanne Grumbling  Here for wound check.  Patient has had right shoulder surgery since I last saw her.  She denies any significant left axillary pain.  Minimal drainage has been noted. Objective:    BP 122/85 (BP Location: Left Arm, Patient Position: Sitting, Cuff Size: Large)   Pulse 79   Temp 97.7 F (36.5 C) (Tympanic)   Resp 18   Ht 5\' 4"  (1.626 m)   Wt 264 lb (119.7 kg)   SpO2 96%   BMI 45.32 kg/m   General:  alert, cooperative and no distress  Left axillary wound healing well.  Minimal drainage present.  Almost fully closed.  Small retained suture removed.     Assessment:    Doing well postoperatively.    Plan:   Continue keeping left axillary wound clean and dry.  Follow-up as needed.

## 2018-10-11 ENCOUNTER — Ambulatory Visit (HOSPITAL_COMMUNITY): Payer: BC Managed Care – PPO | Attending: Orthopedic Surgery | Admitting: Occupational Therapy

## 2018-10-11 ENCOUNTER — Other Ambulatory Visit: Payer: Self-pay

## 2018-10-11 DIAGNOSIS — R29898 Other symptoms and signs involving the musculoskeletal system: Secondary | ICD-10-CM

## 2018-10-11 DIAGNOSIS — M25611 Stiffness of right shoulder, not elsewhere classified: Secondary | ICD-10-CM

## 2018-10-11 DIAGNOSIS — M25511 Pain in right shoulder: Secondary | ICD-10-CM

## 2018-10-11 NOTE — Patient Instructions (Signed)
SHOULDER: Flexion On Table   Place hands on towel placed on table, elbows straight. Lean forward with you upper body, pushing towel away from body.  _15__ reps per set, __3_ sets per day  Abduction (Passive)   With arm out to side, resting on towel placed on table with palm DOWN, keeping trunk away from table, lean to the side while pushing towel away from body.  Repeat _15___ times. Do _3___ sessions per day.  Copyright  [employer]. All rights reserved.     Internal Rotation (Assistive)   Seated with elbow bent at right angle and held against side, slide arm on table surface in an inward arc keeping elbow anchored in place. Repeat __15__ times. Do __3__ sessions per day. Activity: Use this motion to brush crumbs off the table.     1) Seated Row   Sit up straight with elbows by your sides. Pull back with shoulders/elbows, keeping forearms straight, as if pulling back on the reins of a horse. Squeeze shoulder blades together. Repeat _15__times, _3___sets/day    2) Shoulder Elevation    Sit up straight with arms by your sides. Slowly bring your shoulders up towards your ears. Repeat_15__times, __3__ sets/day    3) Shoulder Extension    Sit up straight with both arms by your side, draw your arms back behind your waist. Keep your elbows straight. Repeat __15__times, _3___sets/day.

## 2018-10-12 ENCOUNTER — Telehealth (HOSPITAL_COMMUNITY): Payer: Self-pay | Admitting: Internal Medicine

## 2018-10-12 ENCOUNTER — Encounter (HOSPITAL_COMMUNITY): Payer: Self-pay | Admitting: Occupational Therapy

## 2018-10-12 NOTE — Telephone Encounter (Signed)
10/12/18  I called and spoke to patient and she is only coming for her shoulder... she said that she had nothing else going on so thinking this was an error or it was meant for her shoulder also

## 2018-10-12 NOTE — Therapy (Signed)
[facility] [address] [place], [place], [address] Phone: [phone]   Fax:  [phone]  Occupational Therapy Evaluation  Patient Details  Name: Kathleen Velasquez MRN: [id number] Date of Birth: 08/08/1990 Referring Provider (OT): Dr. Francena HanlyKevin Supple   Encounter Date: 10/11/2018  OT End of Session - 10/12/18 0736    Visit Number  1    Number of Visits  16    Date for OT Re-Evaluation  12/11/18   mini-reassessment 11/09/2018   Authorization Type  [employer] COMM PPO    Authorization Time Period  no visit limit    OT Start Time  1601    OT Stop Time  1633    OT Time Calculation (min)  32 min    Activity Tolerance  Patient tolerated treatment well    Behavior During Therapy  [facility] for tasks assessed/performed       Past Medical History:  Diagnosis Date  . Abscess    right buttocks/thigh  . Anxiety   . DUB (dysfunctional uterine bleeding) 05/25/2012  . Hydradenitis   . Hypothyroidism   . Polycystic ovaries   . Thyroid disease    hypothyroidism    Past Surgical History:  Procedure Laterality Date  . ADENOIDECTOMY    . CHOLECYSTECTOMY  01/27/2012   Procedure: LAPAROSCOPIC CHOLECYSTECTOMY;  Surgeon: Fabio BeringBrent C Ziegler, MD;  Location: [facility];  Service: General;  Laterality: N/A;  . HERNIA REPAIR Left    LIH  . HYDRADENITIS EXCISION  10/02/2011   Procedure: EXCISION HYDRADENITIS AXILLA;  Surgeon: Fabio BeringBrent C Ziegler, MD;  Location: [facility];  Service: General;  Laterality: Right;  Right Axillary Dissection  . HYDRADENITIS EXCISION Left 10/19/2013   Procedure: INCISION OF HIDRADENITIS SUPPURATIVA LEFT AXILLA;  Surgeon: Marlane HatcherWilliam S Bradford, MD;  Location: [facility];  Service: General;  Laterality: Left;  . HYDRADENITIS EXCISION Left 09/09/2018   Procedure: EXCISION HIDRADENITIS AXILLA, LEFT;  Surgeon: Franky MachoJenkins, Mark, MD;  Location: [facility];  Service: General;  Laterality: Left;  . TONSILLECTOMY      There were no vitals filed for this visit.  Subjective  Assessment - 10/12/18 0732    Subjective   S: I hurt my shoulder at work.    Pertinent History  Pt is a 28 y/o female s/p right shoulder arthroscopy, bursectomy, and debridement on 09/30/18. 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 Pt reports she has been completing exercises provided by MD and is only wearing sling when out in the community. Pt was referred to occupational therapy for evaluation and treatment by Dr. Francena HanlyKevin Supple.    Special Tests  FOTO: 52/100    Patient Stated Goals  To have full use of my right arm.    Currently in Pain?  No/denies        [facility] Assessment - 10/11/18 1558      Assessment   Medical Diagnosis  s/p right shoulder arthroscopy, bursectomy, and debridement    Referring Provider (OT)  Dr. Francena HanlyKevin Supple    Onset Date/Surgical Date  09/30/18    Hand Dominance  Right    Next MD Visit  11/09/2018    Prior Therapy  None      Precautions   Precautions  Shoulder    Type of Shoulder Precautions  progress as tolerated      Balance Screen   Has the patient fallen in the past 6 months  No    Has the patient had a decrease in  activity level because of a fear of falling?   No    Is the patient reluctant to leave their home because of a fear of falling?   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    Leisure  swimming, reading, typing, caring for niece      ADL   ADL comments  Pt is having difficulty with reaching overhead and behind back, peri-hygiene, washing and fixing hair. Pt is unable to sleep on the right arm, needs pillows propping when sleeping on the left side.       Written Expression   Dominant Hand  Right      Cognition   Overall Cognitive Status  Within Functional Limits for tasks assessed      Observation/Other Assessments   Focus on Therapeutic Outcomes (FOTO)   52/100      ROM  / Strength   AROM / PROM / Strength  AROM;PROM;Strength      Palpation   Palpation comment  mod fascial restrictions in right upper arm, anterior shoulder, and scapular regions      AROM   Overall AROM Comments  Assessed seated, er/IR adducted    AROM Assessment Site  Shoulder    Right/Left Shoulder  Right    Right Shoulder Flexion  95 Degrees    Right Shoulder ABduction  81 Degrees    Right Shoulder Internal Rotation  90 Degrees    Right Shoulder External Rotation  72 Degrees      PROM   Overall PROM Comments  Assessed supine, er/IR adducted    PROM Assessment Site  Shoulder    Right/Left Shoulder  Right    Right Shoulder Flexion  135 Degrees    Right Shoulder ABduction  150 Degrees    Right Shoulder Internal Rotation  90 Degrees    Right Shoulder External Rotation  60 Degrees      Strength   Overall Strength Comments  Assessed seated, er/IR adducted    Strength Assessment Site  Shoulder    Right/Left Shoulder  Right    Right Shoulder Flexion  3-/5    Right Shoulder ABduction  3-/5    Right Shoulder Internal Rotation  3/5    Right Shoulder External Rotation  3/5                      OT Education - 10/11/18 1626    Education Details  table slides, scapular A/ROM    Person(s) Educated  Patient    Methods  Explanation;Demonstration;Handout    Comprehension  Verbalized understanding;Returned demonstration       OT Short Term Goals - 10/12/18 0740      OT SHORT TERM GOAL #1   Title  Pt will be provided with and educated on HEP to improve mobility of RUE required for participation in ADL tasks.    Time  4    Period  Weeks    Status  New    Target Date  11/11/18      OT SHORT TERM GOAL #2   Title  Pt will improve P/ROM of RUE to WNL to increase ability to use RUE as assist during dressing tasks.    Time  3    Period  Weeks    Status  New  OT SHORT TERM GOAL #3   Title  Pt will increase RUE strength to 3+/5 to improve ability to use RUE during  hair washing with minimal compensatory strategies.    Time  4    Period  Weeks    Status  New        OT Long Term Goals - 10/12/18 0743      OT LONG TERM GOAL #1   Title  Pt will return to highest level of functioning and independence using RUE as dominant during ADLs.    Time  8    Period  Weeks    Status  New    Target Date  12/11/18      OT LONG TERM GOAL #2   Title  Pt will decrease RUE fascial restrictions to minimal amounts or less to improve mobility required for functional reaching.    Time  8    Period  Weeks    Status  New      OT LONG TERM GOAL #3   Title  Pt will increase RUE A/ROM to [facility] to improve ability to reach overhead and behind back during ADLs.    Time  8    Period  Weeks    Status  New      OT LONG TERM GOAL #4   Title  Pt will decrease pain in RUE to 3/10 or less to improve ability to sleep in preferred position.    Time  8    Period  Weeks    Status  New      OT LONG TERM GOAL #5   Title  Pt will increase RUE strength to 5/5 to improve ability to perform work tasks.    Time  8    Period  Weeks    Status  New            Plan - 10/12/18 0737    Clinical Impression Statement  A: Pt is a 28 y/o female s/p right arthroscopy, bursectomy, and debridement on 09/30/18 presenting with limitations in functional use of RUE as dominant. Pt reports having slightly below 50% ROM currently and is working to incorporate the RUE into ADLs as much as possible.    OT Occupational Profile and History  Problem Focused Assessment - Including review of records relating to presenting problem    Occupational performance deficits (Please refer to evaluation for details):  ADL's;IADL's;Rest and Sleep;Work;Leisure    Body Structure / Function / Physical Skills  ADL;UE functional use;Fascial restriction;Pain;ROM;IADL;Strength    Rehab Potential  Good    Clinical Decision Making  Limited treatment options, no task modification necessary    Comorbidities Affecting  Occupational Performance:  None    Modification or Assistance to Complete Evaluation   No modification of tasks or assist necessary to complete eval    OT Frequency  2x / week    OT Duration  8 weeks    OT Treatment/Interventions  Self-care/ADL training;Ultrasound;Patient/family education;Passive range of motion;Cryotherapy;Electrical Stimulation;Moist Heat;Therapeutic exercise;Manual Therapy;Therapeutic activities    Plan  P: Pt will benefit from skilled OT services to decrease pain and fascial restrictions, increase ROM, strength, and functional use of the RUE as dominant. Treatment plan: Myofascial release and manual techniques, P/ROM, AA/ROM, A/ROM, general RUE strengthening, scapular stability and strengthening, modalities prn    Consulted and Agree with Plan of Care  Patient       Patient will benefit from skilled therapeutic intervention in order to improve the following deficits  and impairments:   Body Structure / Function / Physical Skills: ADL, UE functional use, Fascial restriction, Pain, ROM, IADL, Strength       Visit Diagnosis: Acute pain of right shoulder  Other symptoms and signs involving the musculoskeletal system  Stiffness of right shoulder, not elsewhere classified    Problem List Patient Active Problem List   Diagnosis Date Noted  . Left axillary hidradenitis   . Anovulatory (dysfunctional uterine) bleeding 06/07/2012  . DUB (dysfunctional uterine bleeding) 05/25/2012  . FOOT PAIN, CHRONIC 05/09/2007   Guadelupe Sabin, OTR/L  [phone] 10/12/2018, 7:46 AM  [facility] [address] [place], [place], [address] Phone: [phone]   Fax:  [phone]  Name: Kathleen Velasquez MRN: [id number] Date of Birth: 14-May-1990

## 2018-10-15 DIAGNOSIS — I9789 Other postprocedural complications and disorders of the circulatory system, not elsewhere classified: Secondary | ICD-10-CM | POA: Diagnosis not present

## 2018-10-15 DIAGNOSIS — M25511 Pain in right shoulder: Secondary | ICD-10-CM | POA: Diagnosis not present

## 2018-10-15 DIAGNOSIS — Z4889 Encounter for other specified surgical aftercare: Secondary | ICD-10-CM | POA: Diagnosis not present

## 2018-10-15 DIAGNOSIS — M7541 Impingement syndrome of right shoulder: Secondary | ICD-10-CM | POA: Diagnosis not present

## 2018-10-17 ENCOUNTER — Telehealth (HOSPITAL_COMMUNITY): Payer: Self-pay | Admitting: Internal Medicine

## 2018-10-17 NOTE — Telephone Encounter (Signed)
10/17/18  Called and left a message to offer appt on 10/8 since she was on the wait list

## 2018-10-20 ENCOUNTER — Encounter (HOSPITAL_COMMUNITY): Payer: Self-pay

## 2018-10-20 ENCOUNTER — Other Ambulatory Visit: Payer: Self-pay

## 2018-10-20 ENCOUNTER — Ambulatory Visit (HOSPITAL_COMMUNITY): Payer: BC Managed Care – PPO | Attending: Orthopedic Surgery

## 2018-10-20 DIAGNOSIS — M25511 Pain in right shoulder: Secondary | ICD-10-CM

## 2018-10-20 DIAGNOSIS — M25611 Stiffness of right shoulder, not elsewhere classified: Secondary | ICD-10-CM | POA: Insufficient documentation

## 2018-10-20 DIAGNOSIS — R29898 Other symptoms and signs involving the musculoskeletal system: Secondary | ICD-10-CM | POA: Insufficient documentation

## 2018-10-20 NOTE — Therapy (Signed)
[facility] [address] [place], [place], [address] Phone: [phone]   Fax:  [phone]  Occupational Therapy Treatment  Patient Details  Name: Kathleen Velasquez MRN: [id number] Date of Birth: 08/31/1990 Referring Provider (OT): Dr. Francena HanlyKevin Supple   Encounter Date: 10/20/2018  OT End of Session - 10/20/18 1448    Visit Number  2    Number of Visits  16    Date for OT Re-Evaluation  12/11/18   mini-reassessment 11/09/2018   Authorization Type  [employer] COMM PPO    Authorization Time Period  no visit limit    OT Start Time  1348    OT Stop Time  1426    OT Time Calculation (min)  38 min    Activity Tolerance  Patient tolerated treatment well    Behavior During Therapy  [facility] for tasks assessed/performed       Past Medical History:  Diagnosis Date  . Abscess    right buttocks/thigh  . Anxiety   . DUB (dysfunctional uterine bleeding) 05/25/2012  . Hydradenitis   . Hypothyroidism   . Polycystic ovaries   . Thyroid disease    hypothyroidism    Past Surgical History:  Procedure Laterality Date  . ADENOIDECTOMY    . CHOLECYSTECTOMY  01/27/2012   Procedure: LAPAROSCOPIC CHOLECYSTECTOMY;  Surgeon: Fabio BeringBrent C Ziegler, MD;  Location: [facility];  Service: General;  Laterality: N/A;  . HERNIA REPAIR Left    LIH  . HYDRADENITIS EXCISION  10/02/2011   Procedure: EXCISION HYDRADENITIS AXILLA;  Surgeon: Fabio BeringBrent C Ziegler, MD;  Location: [facility];  Service: General;  Laterality: Right;  Right Axillary Dissection  . HYDRADENITIS EXCISION Left 10/19/2013   Procedure: INCISION OF HIDRADENITIS SUPPURATIVA LEFT AXILLA;  Surgeon: Marlane HatcherWilliam S Bradford, MD;  Location: [facility];  Service: General;  Laterality: Left;  . HYDRADENITIS EXCISION Left 09/09/2018   Procedure: EXCISION HIDRADENITIS AXILLA, LEFT;  Surgeon: Franky MachoJenkins, Mark, MD;  Location: [facility];  Service: General;  Laterality: Left;  . TONSILLECTOMY      There were no vitals filed for this visit.  Subjective  Assessment - 10/20/18 1418    Subjective   S: The exercises are going ok. I take a long time to do them.    Currently in Pain?  Yes    Pain Score  3     Pain Location  Shoulder    Pain Orientation  Left    Pain Descriptors / Indicators  Sore;Aching;Dull    Pain Type  Acute pain    Pain Radiating Towards  N/A    Pain Onset  Today    Pain Frequency  Constant    Aggravating Factors   Movement and use    Pain Relieving Factors  ice machine, over the counter pain medication, prescription medication as needed.    Effect of Pain on Daily Activities  Max effect    Multiple Pain Sites  No         [facility] OT Assessment - 10/20/18 1421      Assessment   Medical Diagnosis  s/p right shoulder arthroscopy, bursectomy, and debridement      Precautions   Precautions  Shoulder    Type of Shoulder Precautions  progress as tolerated               OT Treatments/Exercises (OP) - 10/20/18 1421      Exercises   Exercises  Shoulder      Shoulder Exercises: Supine   Protraction  PROM;5 reps;AAROM;10 reps    Horizontal ABduction  PROM;5 reps;AAROM;10 reps    External Rotation  PROM;5 reps;AAROM;10 reps    Internal Rotation  PROM;5 reps;AAROM;10 reps    Flexion  PROM;5 reps;AAROM;10 reps    ABduction  PROM;5 reps;AAROM;10 reps      Shoulder Exercises: Therapy Ball   Flexion  10 reps    ABduction  10 reps      Manual Therapy   Manual Therapy  Myofascial release    Manual therapy comments  Completed prior to exercisese    Myofascial Release  Myofascial release and manual stretching completed to right upper arm, trapezius, and scapularis region to decrease fascial restrictions and increase joint mobility in a pain free.              OT Education - 10/20/18 1448    Education Details  reviewed therapy goals    Person(s) Educated  Patient    Methods  Explanation    Comprehension  Verbalized understanding       OT Short Term Goals - 10/20/18 1423      OT SHORT TERM GOAL #1    Title  Pt will be provided with and educated on HEP to improve mobility of RUE required for participation in ADL tasks.    Time  4    Period  Weeks    Status  On-going    Target Date  11/11/18      OT SHORT TERM GOAL #2   Title  Pt will improve P/ROM of RUE to WNL to increase ability to use RUE as assist during dressing tasks.    Time  3    Period  Weeks    Status  On-going      OT SHORT TERM GOAL #3   Title  Pt will increase RUE strength to 3+/5 to improve ability to use RUE during hair washing with minimal compensatory strategies.    Time  4    Period  Weeks    Status  On-going        OT Long Term Goals - 10/20/18 1423      OT LONG TERM GOAL #1   Title  Pt will return to highest level of functioning and independence using RUE as dominant during ADLs.    Time  8    Period  Weeks    Status  On-going      OT LONG TERM GOAL #2   Title  Pt will decrease RUE fascial restrictions to minimal amounts or less to improve mobility required for functional reaching.    Time  8    Period  Weeks    Status  On-going      OT LONG TERM GOAL #3   Title  Pt will increase RUE A/ROM to [facility] to improve ability to reach overhead and behind back during ADLs.    Time  8    Period  Weeks    Status  On-going      OT LONG TERM GOAL #4   Title  Pt will decrease pain in RUE to 3/10 or less to improve ability to sleep in preferred position.    Time  8    Period  Weeks    Status  On-going      OT LONG TERM GOAL #5   Title  Pt will increase RUE strength to 5/5 to improve ability to perform work tasks.    Time  8    Period  Weeks  Status  On-going            Plan - 10/20/18 1448    Clinical Impression Statement  A: Initiated myofascial release, manual stretching, AA/ROM supine and seated therapy ball stretches. patient reports discomfort during decent of movement although able to complete all exercises. Manual therapy was completed to address fascial restrictions. VC were provided for  form and technique.    Body Structure / Function / Physical Skills  ADL;UE functional use;Fascial restriction;Pain;ROM;IADL;Strength    Plan  P: continue with AA/ROM supine and attempt standing if able to tolerate. Provide to HEP if needed. Add wall wash. and pvc pipe slide.    Consulted and Agree with Plan of Care  Patient       Patient will benefit from skilled therapeutic intervention in order to improve the following deficits and impairments:   Body Structure / Function / Physical Skills: ADL, UE functional use, Fascial restriction, Pain, ROM, IADL, Strength       Visit Diagnosis: Acute pain of right shoulder  Other symptoms and signs involving the musculoskeletal system  Stiffness of right shoulder, not elsewhere classified    Problem List Patient Active Problem List   Diagnosis Date Noted  . Left axillary hidradenitis   . Anovulatory (dysfunctional uterine) bleeding 06/07/2012  . DUB (dysfunctional uterine bleeding) 05/25/2012  . FOOT PAIN, CHRONIC 05/09/2007   Limmie Patricia, OTR/L,CBIS  [phone]  10/20/2018, 2:51 PM  [facility] [address] [place], [place], [address] Phone: [phone]   Fax:  [phone]  Name: Kathleen Velasquez MRN: [id number] Date of Birth: 02/27/1990

## 2018-10-21 ENCOUNTER — Encounter

## 2018-10-26 ENCOUNTER — Other Ambulatory Visit: Payer: Self-pay

## 2018-10-26 ENCOUNTER — Ambulatory Visit (HOSPITAL_COMMUNITY): Payer: Self-pay

## 2018-10-26 ENCOUNTER — Encounter (HOSPITAL_COMMUNITY): Payer: Self-pay

## 2018-10-26 DIAGNOSIS — M25611 Stiffness of right shoulder, not elsewhere classified: Secondary | ICD-10-CM

## 2018-10-26 DIAGNOSIS — M25511 Pain in right shoulder: Secondary | ICD-10-CM

## 2018-10-26 DIAGNOSIS — R29898 Other symptoms and signs involving the musculoskeletal system: Secondary | ICD-10-CM

## 2018-10-26 NOTE — Therapy (Signed)
[place], [place], [address] Phone: [phone]   Fax:  [phone]  Occupational Therapy Treatment  Patient Details  Name: Kathleen Velasquez MRN: [id number] Date of Birth: 05-15-1990 Referring Provider (OT): Dr. Justice Britain   Encounter Date: 10/26/2018  OT End of Session - 10/26/18 1732    Visit Number  3    Number of Visits  16    Date for OT Re-Evaluation  12/11/18   mini-reassessment 11/09/2018   Authorization Type  [employer] COMM PPO    Authorization Time Period  no visit limit    OT Start Time  1646    OT Stop Time  1729    OT Time Calculation (min)  43 min    Activity Tolerance  Patient tolerated treatment well    Behavior During Therapy  [facility] for tasks assessed/performed       Past Medical History:  Diagnosis Date  . Abscess    right buttocks/thigh  . Anxiety   . DUB (dysfunctional uterine bleeding) 05/25/2012  . Hydradenitis   . Hypothyroidism   . Polycystic ovaries   . Thyroid disease    hypothyroidism    Past Surgical History:  Procedure Laterality Date  . ADENOIDECTOMY    . CHOLECYSTECTOMY  01/27/2012   Procedure: LAPAROSCOPIC CHOLECYSTECTOMY;  Surgeon: Donato Heinz, MD;  Location: [facility];  Service: General;  Laterality: N/A;  . HERNIA REPAIR Left    LIH  . HYDRADENITIS EXCISION  10/02/2011   Procedure: EXCISION HYDRADENITIS AXILLA;  Surgeon: Donato Heinz, MD;  Location: [facility];  Service: General;  Laterality: Right;  Right Axillary Dissection  . HYDRADENITIS EXCISION Left 10/19/2013   Procedure: INCISION OF HIDRADENITIS SUPPURATIVA LEFT AXILLA;  Surgeon: Scherry Ran, MD;  Location: [facility];  Service: General;  Laterality: Left;  . HYDRADENITIS EXCISION Left 09/09/2018   Procedure: EXCISION HIDRADENITIS AXILLA, LEFT;  Surgeon: Aviva Signs, MD;  Location: [facility];  Service: General;  Laterality: Left;  . TONSILLECTOMY      There were no vitals filed for this visit.  Subjective  Assessment - 10/26/18 1719    Subjective   S: I did something bad. I was buying cat litter and didn't think about it and grabbed the container with this arm then it hurt so bad I dropped the container. It's been hurting for the past 4 days now.    Currently in Pain?  Yes    Pain Score  6     Pain Location  Shoulder    Pain Orientation  Left    Pain Descriptors / Indicators  Sore;Aching;Dull    Pain Type  Acute pain         [facility] OT Assessment - 10/26/18 1722      Assessment   Medical Diagnosis  s/p right shoulder arthroscopy, bursectomy, and debridement      Precautions   Precautions  Shoulder    Type of Shoulder Precautions  progress as tolerated               OT Treatments/Exercises (OP) - 10/26/18 1722      Exercises   Exercises  Shoulder      Shoulder Exercises: Supine   Protraction  PROM;5 reps    Horizontal ABduction  PROM;5 reps    External Rotation  PROM;5 reps    Internal Rotation  PROM;5 reps    Flexion  PROM;5 reps    ABduction  PROM;5 reps  Shoulder Exercises: Stretch   Internal Rotation Stretch  1 rep   20 " horizontal towel     Modalities   Modalities  Moist Heat;Electrical Stimulation      Moist Heat Therapy   Number Minutes Moist Heat  10 Minutes    Moist Heat Location  Shoulder      Electrical Stimulation   Electrical Stimulation Location  right shoulder    Electrical Stimulation Action  interferential    Electrical Stimulation Parameters  8.2 CV    Electrical Stimulation Goals  Pain      Manual Therapy   Manual Therapy  Myofascial release    Manual therapy comments  Completed prior to exercisese    Myofascial Release  Myofascial release and manual stretching completed to right upper arm, trapezius, and scapularis region to decrease fascial restrictions and increase joint mobility in a pain free.              OT Education - 10/26/18 1731    Education Details  Patient educated on completing internal rotation stretch and  flexion stretch using wall.    Person(s) Educated  Patient    Methods  Explanation;Demonstration    Comprehension  Verbalized understanding       OT Short Term Goals - 10/20/18 1423      OT SHORT TERM GOAL #1   Title  Pt will be provided with and educated on HEP to improve mobility of RUE required for participation in ADL tasks.    Time  4    Period  Weeks    Status  On-going    Target Date  11/11/18      OT SHORT TERM GOAL #2   Title  Pt will improve P/ROM of RUE to WNL to increase ability to use RUE as assist during dressing tasks.    Time  3    Period  Weeks    Status  On-going      OT SHORT TERM GOAL #3   Title  Pt will increase RUE strength to 3+/5 to improve ability to use RUE during hair washing with minimal compensatory strategies.    Time  4    Period  Weeks    Status  On-going        OT Long Term Goals - 10/20/18 1423      OT LONG TERM GOAL #1   Title  Pt will return to highest level of functioning and independence using RUE as dominant during ADLs.    Time  8    Period  Weeks    Status  On-going      OT LONG TERM GOAL #2   Title  Pt will decrease RUE fascial restrictions to minimal amounts or less to improve mobility required for functional reaching.    Time  8    Period  Weeks    Status  On-going      OT LONG TERM GOAL #3   Title  Pt will [facility] to improve ability to reach overhead and behind back during ADLs.    Time  8    Period  Weeks    Status  On-going      OT LONG TERM GOAL #4   Title  Pt will decrease pain in RUE to 3/10 or less to improve ability to sleep in preferred position.    Time  8    Period  Weeks    Status  On-going  OT LONG TERM GOAL #5   Title  Pt will increase RUE strength to 5/5 to improve ability to perform work tasks.    Time  8    Period  Weeks    Status  On-going            Plan - 10/26/18 1732    Clinical Impression Statement  A: Pt arrived with increased pain due to recently attempting  to lift a container of cat litter without thinking about it with her RUE. Session focused on pain management techniques while performing manual techniques to address pain and fascial restrictions. Utilized moist heat and TENS unit followed by education on completing flexion and internal rotation stretch at home. Pt reports a pain level of 5/10 at end of session.    Body Structure / Function / Physical Skills  ADL;UE functional use;Fascial restriction;Pain;ROM;IADL;Strength    Plan  P:Follow up on pain level.  Continue with AA/ROM supine and attempt standing if able to tolerate. Provide to HEP if needed. Add wall wash. and pvc pipe slide.    Consulted and Agree with Plan of Care  Patient       Patient will benefit from skilled therapeutic intervention in order to improve the following deficits and impairments:   Body Structure / Function / Physical Skills: ADL, UE functional use, Fascial restriction, Pain, ROM, IADL, Strength       Visit Diagnosis: Stiffness of right shoulder, not elsewhere classified  Other symptoms and signs involving the musculoskeletal system  Acute pain of right shoulder    Problem List Patient Active Problem List   Diagnosis Date Noted  . Left axillary hidradenitis   . Anovulatory (dysfunctional uterine) bleeding 06/07/2012  . DUB (dysfunctional uterine bleeding) 05/25/2012  . FOOT PAIN, CHRONIC 05/09/2007    Limmie Patricia, OTR/L,CBIS  [phone]  10/26/2018, 5:36 PM  [facility] [address] [place], [place], [address] Phone: [phone]   Fax:  [phone]  Name: Kathleen Velasquez MRN: [id number] Date of Birth: [birth year]

## 2018-10-28 ENCOUNTER — Ambulatory Visit (HOSPITAL_COMMUNITY): Payer: Self-pay | Admitting: Physical Therapy

## 2018-10-28 ENCOUNTER — Encounter (HOSPITAL_COMMUNITY): Payer: Self-pay

## 2018-10-28 ENCOUNTER — Other Ambulatory Visit: Payer: Self-pay

## 2018-10-28 ENCOUNTER — Ambulatory Visit (HOSPITAL_COMMUNITY): Payer: Self-pay

## 2018-10-28 DIAGNOSIS — M25611 Stiffness of right shoulder, not elsewhere classified: Secondary | ICD-10-CM

## 2018-10-28 DIAGNOSIS — M25511 Pain in right shoulder: Secondary | ICD-10-CM

## 2018-10-28 DIAGNOSIS — R29898 Other symptoms and signs involving the musculoskeletal system: Secondary | ICD-10-CM

## 2018-10-28 NOTE — Therapy (Signed)
[facility] [address] [place], [place], [address] Phone: [phone]   Fax:  [phone]  Occupational Therapy Treatment  Patient Details  Name: Kathleen Velasquez MRN: [id number] Date of Birth: 01/05/1991 Referring Provider (OT): Dr. Francena HanlyKevin Supple   Encounter Date: 10/28/2018  OT End of Session - 10/28/18 1320    Visit Number  4    Number of Visits  16    Date for OT Re-Evaluation  12/11/18   mini-reassessment 11/09/2018   Authorization Type  [employer] COMM PPO    Authorization Time Period  no visit limit    OT Start Time  1120    OT Stop Time  1158    OT Time Calculation (min)  38 min    Activity Tolerance  Patient tolerated treatment well    Behavior During Therapy  [facility] for tasks assessed/performed       Past Medical History:  Diagnosis Date  . Abscess    right buttocks/thigh  . Anxiety   . DUB (dysfunctional uterine bleeding) 05/25/2012  . Hydradenitis   . Hypothyroidism   . Polycystic ovaries   . Thyroid disease    hypothyroidism    Past Surgical History:  Procedure Laterality Date  . ADENOIDECTOMY    . CHOLECYSTECTOMY  01/27/2012   Procedure: LAPAROSCOPIC CHOLECYSTECTOMY;  Surgeon: Fabio BeringBrent C Ziegler, MD;  Location: [facility];  Service: General;  Laterality: N/A;  . HERNIA REPAIR Left    LIH  . HYDRADENITIS EXCISION  10/02/2011   Procedure: EXCISION HYDRADENITIS AXILLA;  Surgeon: Fabio BeringBrent C Ziegler, MD;  Location: [facility];  Service: General;  Laterality: Right;  Right Axillary Dissection  . HYDRADENITIS EXCISION Left 10/19/2013   Procedure: INCISION OF HIDRADENITIS SUPPURATIVA LEFT AXILLA;  Surgeon: Marlane HatcherWilliam S Bradford, MD;  Location: [facility];  Service: General;  Laterality: Left;  . HYDRADENITIS EXCISION Left 09/09/2018   Procedure: EXCISION HIDRADENITIS AXILLA, LEFT;  Surgeon: Franky MachoJenkins, Mark, MD;  Location: [facility];  Service: General;  Laterality: Left;  . TONSILLECTOMY      There were no vitals filed for this visit.  Subjective  Assessment - 10/28/18 1141    Subjective   S: Last night i lifted my arm to put a pillow up under it and it popped.    Currently in Pain?  Yes    Pain Score  6     Pain Location  Shoulder    Pain Descriptors / Indicators  Aching;Sore;Dull    Pain Type  Acute pain    Pain Radiating Towards  N/A    Pain Onset  Today    Pain Frequency  Constant    Aggravating Factors   Moving it to place a pillow    Pain Relieving Factors  ice machine, over the counter pain medication, presciption medication as needed.    Effect of Pain on Daily Activities  max effect    Multiple Pain Sites  No         [facility] OT Assessment - 10/28/18 1140      Assessment   Medical Diagnosis  s/p right shoulder arthroscopy, bursectomy, and debridement      Precautions   Precautions  Shoulder    Type of Shoulder Precautions  progress as tolerated               OT Treatments/Exercises (OP) - 10/28/18 1141      Exercises   Exercises  Shoulder      Shoulder Exercises: Supine   Protraction  PROM;5 reps;AAROM;10 reps    Horizontal ABduction  PROM;5 reps;AAROM;10 reps    External Rotation  PROM;5 reps    Internal Rotation  PROM;5 reps    Flexion  PROM;5 reps;AAROM;10 reps    ABduction  PROM;5 reps      Shoulder Exercises: Standing   Protraction  AAROM;10 reps    Horizontal ABduction  AAROM;10 reps    External Rotation  AAROM;10 reps    Internal Rotation  AAROM;10 reps    Flexion  AAROM;10 reps    ABduction  AAROM;10 reps      Shoulder Exercises: ROM/Strengthening   Wall Wash  1'    Other ROM/Strengthening Exercises  PVC pipe slide; 10X      Manual Therapy   Manual Therapy  Myofascial release    Manual therapy comments  Completed prior to exercisese    Myofascial Release  Myofascial release and manual stretching completed to right upper arm, trapezius, and scapularis region to decrease fascial restrictions and increase joint mobility in a pain free.              OT Education - 10/28/18  1147    Education Details  AA/ROM shoulder exercises. May stop HEP given at eval    Person(s) Educated  Patient    Methods  Explanation;Demonstration;Verbal cues;Handout    Comprehension  Returned demonstration;Verbalized understanding       OT Short Term Goals - 10/20/18 1423      OT SHORT TERM GOAL #1   Title  Pt will be provided with and educated on HEP to improve mobility of RUE required for participation in ADL tasks.    Time  4    Period  Weeks    Status  On-going    Target Date  11/11/18      OT SHORT TERM GOAL #2   Title  Pt will improve P/ROM of RUE to WNL to increase ability to use RUE as assist during dressing tasks.    Time  3    Period  Weeks    Status  On-going      OT SHORT TERM GOAL #3   Title  Pt will increase RUE strength to 3+/5 to improve ability to use RUE during hair washing with minimal compensatory strategies.    Time  4    Period  Weeks    Status  On-going        OT Long Term Goals - 10/20/18 1423      OT LONG TERM GOAL #1   Title  Pt will return to highest level of functioning and independence using RUE as dominant during ADLs.    Time  8    Period  Weeks    Status  On-going      OT LONG TERM GOAL #2   Title  Pt will decrease RUE fascial restrictions to minimal amounts or less to improve mobility required for functional reaching.    Time  8    Period  Weeks    Status  On-going      OT LONG TERM GOAL #3   Title  Pt will increase RUE A/ROM to [facility] to improve ability to reach overhead and behind back during ADLs.    Time  8    Period  Weeks    Status  On-going      OT LONG TERM GOAL #4   Title  Pt will decrease pain in RUE to 3/10 or less to improve ability to sleep in  preferred position.    Time  8    Period  Weeks    Status  On-going      OT LONG TERM GOAL #5   Title  Pt will increase RUE strength to 5/5 to improve ability to perform work tasks.    Time  8    Period  Weeks    Status  On-going            Plan - 10/28/18  1321    Clinical Impression Statement  A: Completed standing AA/ROM, pvc pipe slide, and wall wash to further increase shoulder and scapular strength. Manual techniques completed to address fascial restrictions in right upper arm. VC for form and technique. HEP was updated.    Body Structure / Function / Physical Skills  ADL;UE functional use;Fascial restriction;Pain;ROM;IADL;Strength    Plan  P: Add proximal shoulder strengthening and scapular strengthening with theraband.    Consulted and Agree with Plan of Care  Patient       Patient will benefit from skilled therapeutic intervention in order to improve the following deficits and impairments:   Body Structure / Function / Physical Skills: ADL, UE functional use, Fascial restriction, Pain, ROM, IADL, Strength       Visit Diagnosis: Other symptoms and signs involving the musculoskeletal system  Stiffness of right shoulder, not elsewhere classified  Acute pain of right shoulder    Problem List Patient Active Problem List   Diagnosis Date Noted  . Left axillary hidradenitis   . Anovulatory (dysfunctional uterine) bleeding 06/07/2012  . DUB (dysfunctional uterine bleeding) 05/25/2012  . FOOT PAIN, CHRONIC 05/09/2007   Limmie Patricia, OTR/L,CBIS  [phone]  10/28/2018, 1:23 PM  [facility] [address] [place], [place], [address] Phone: [phone]   Fax:  [phone]  Name: Kathleen Velasquez MRN: [id number] Date of Birth: 09/17/1990

## 2018-10-28 NOTE — Patient Instructions (Signed)

## 2018-11-08 ENCOUNTER — Ambulatory Visit (HOSPITAL_COMMUNITY): Payer: Self-pay | Admitting: Occupational Therapy

## 2018-11-10 ENCOUNTER — Ambulatory Visit (HOSPITAL_COMMUNITY): Payer: Self-pay | Admitting: Occupational Therapy

## 2018-11-10 ENCOUNTER — Telehealth (HOSPITAL_COMMUNITY): Payer: Self-pay | Admitting: Occupational Therapy

## 2018-11-10 NOTE — Telephone Encounter (Signed)
Called pt regarding no-shows on 10/27 and 10/29. Left message informing pt of next appt and asked her to call if unable to attend.   Guadelupe Sabin, OTR/L  [phone] 0/29/2020

## 2018-11-15 ENCOUNTER — Telehealth (HOSPITAL_COMMUNITY): Payer: Self-pay | Admitting: Occupational Therapy

## 2018-11-15 ENCOUNTER — Ambulatory Visit (HOSPITAL_COMMUNITY): Payer: Self-pay | Attending: Orthopedic Surgery | Admitting: Occupational Therapy

## 2018-11-15 ENCOUNTER — Encounter (HOSPITAL_COMMUNITY): Payer: Self-pay | Admitting: Occupational Therapy

## 2018-11-15 ENCOUNTER — Other Ambulatory Visit: Payer: Self-pay

## 2018-11-15 DIAGNOSIS — M25511 Pain in right shoulder: Secondary | ICD-10-CM | POA: Insufficient documentation

## 2018-11-15 DIAGNOSIS — R29898 Other symptoms and signs involving the musculoskeletal system: Secondary | ICD-10-CM | POA: Insufficient documentation

## 2018-11-15 DIAGNOSIS — M25611 Stiffness of right shoulder, not elsewhere classified: Secondary | ICD-10-CM | POA: Insufficient documentation

## 2018-11-15 NOTE — Telephone Encounter (Signed)
has another appt with someone else had to cancel thursday's appt.

## 2018-11-15 NOTE — Therapy (Signed)
[place], [place], [address] Phone: [phone]   Fax:  [phone]  Occupational Therapy Treatment  Patient Details  Name: Kathleen Velasquez MRN: [id number] Date of Birth: 1990/08/08 Referring Provider (OT): Dr. Justice Britain   Encounter Date: 11/15/2018  OT End of Session - 11/15/18 1636    Visit Number  5    Number of Visits  16    Date for OT Re-Evaluation  12/11/18    Authorization Type  [employer] COMM PPO    Authorization Time Period  no visit limit    OT Start Time  1517    OT Stop Time  1556    OT Time Calculation (min)  39 min    Activity Tolerance  Patient tolerated treatment well    Behavior During Therapy  [facility] for tasks assessed/performed       Past Medical History:  Diagnosis Date  . Abscess    right buttocks/thigh  . Anxiety   . DUB (dysfunctional uterine bleeding) 05/25/2012  . Hydradenitis   . Hypothyroidism   . Polycystic ovaries   . Thyroid disease    hypothyroidism    Past Surgical History:  Procedure Laterality Date  . ADENOIDECTOMY    . CHOLECYSTECTOMY  01/27/2012   Procedure: LAPAROSCOPIC CHOLECYSTECTOMY;  Surgeon: Donato Heinz, MD;  Location: [facility];  Service: General;  Laterality: N/A;  . HERNIA REPAIR Left    LIH  . HYDRADENITIS EXCISION  10/02/2011   Procedure: EXCISION HYDRADENITIS AXILLA;  Surgeon: Donato Heinz, MD;  Location: [facility];  Service: General;  Laterality: Right;  Right Axillary Dissection  . HYDRADENITIS EXCISION Left 10/19/2013   Procedure: INCISION OF HIDRADENITIS SUPPURATIVA LEFT AXILLA;  Surgeon: Scherry Ran, MD;  Location: [facility];  Service: General;  Laterality: Left;  . HYDRADENITIS EXCISION Left 09/09/2018   Procedure: EXCISION HIDRADENITIS AXILLA, LEFT;  Surgeon: Aviva Signs, MD;  Location: [facility];  Service: General;  Laterality: Left;  . TONSILLECTOMY      There were no vitals filed for this visit.  Subjective Assessment - 11/15/18 1517    Subjective   S: I did sleep on it last night more than I usually do.    Currently in Pain?  Yes    Pain Score  4     Pain Location  Shoulder    Pain Orientation  Right    Pain Descriptors / Indicators  Aching;Sore    Pain Type  Acute pain    Pain Radiating Towards  N/A    Pain Onset  Today    Pain Frequency  Constant    Aggravating Factors   certain movements    Pain Relieving Factors  OTC medication    Effect of Pain on Daily Activities  min effect on ADLs    Multiple Pain Sites  No         [facility] OT Assessment - 11/15/18 1517      Assessment   Medical Diagnosis  s/p right shoulder arthroscopy, bursectomy, and debridement      Precautions   Precautions  Shoulder    Type of Shoulder Precautions  progress as tolerated      Observation/Other Assessments   Focus on Therapeutic Outcomes (FOTO)   64/100   52/100     Palpation   Palpation comment  mod fascial restrictions in right upper arm, anterior shoulder, and scapular regions      AROM   Overall AROM Comments  Assessed  seated, er/IR adducted    AROM Assessment Site  Shoulder    Right/Left Shoulder  Right    Right Shoulder Flexion  158 Degrees   95 previous   Right Shoulder ABduction  180 Degrees   81 previous   Right Shoulder Internal Rotation  90 Degrees   same as previou   Right Shoulder External Rotation  88 Degrees   72 previous     PROM   Overall PROM Comments  Assessed supine, er/IR adducted    PROM Assessment Site  Shoulder    Right/Left Shoulder  Right    Right Shoulder Flexion  178 Degrees   135 previous   Right Shoulder ABduction  180 Degrees   150 previous   Right Shoulder Internal Rotation  90 Degrees   same as previous   Right Shoulder External Rotation  90 Degrees   60 previous     Strength   Overall Strength Comments  Assessed seated, er/IR adducted    Strength Assessment Site  Shoulder    Right/Left Shoulder  Right    Right Shoulder Flexion  4/5   3-/5 previous   Right Shoulder ABduction   4+/5   3-/5 previous   Right Shoulder Internal Rotation  5/5   3/5 previous   Right Shoulder External Rotation  4/5   3/5 previous              OT Treatments/Exercises (OP) - 11/15/18 1521      Exercises   Exercises  Shoulder      Shoulder Exercises: Supine   Protraction  PROM;5 reps;AROM;10 reps    Horizontal ABduction  PROM;5 reps;AROM;10 reps    External Rotation  PROM;5 reps;AROM;10 reps    Internal Rotation  PROM;5 reps;AROM;10 reps    Flexion  PROM;5 reps;AROM;10 reps    ABduction  PROM;5 reps;AROM;10 reps      Shoulder Exercises: Standing   Protraction  AROM;10 reps    Horizontal ABduction  AROM;10 reps    External Rotation  AROM;10 reps    Internal Rotation  AROM;10 reps    Flexion  AROM;10 reps    ABduction  AROM;10 reps    Extension  Theraband;10 reps    Theraband Level (Shoulder Extension)  Level 2 (Red)    Row  Theraband;10 reps    Theraband Level (Shoulder Row)  Level 2 (Red)    Retraction  Theraband;10 reps    Theraband Level (Shoulder Retraction)  Level 2 (Red)      Shoulder Exercises: ROM/Strengthening   Proximal Shoulder Strengthening, Supine  10X each, no rest breaks    Proximal Shoulder Strengthening, Seated  10X each, no rest breaks      Manual Therapy   Manual Therapy  Myofascial release    Manual therapy comments  Completed prior to exercisese    Myofascial Release  Myofascial release and manual stretching completed to right upper arm, trapezius, and scapularis region to decrease fascial restrictions and increase joint mobility in a pain free.              OT Education - 11/15/18 1548    Education Details  A/ROM shoulder exercises    Person(s) Educated  Patient    Methods  Explanation;Demonstration;Verbal cues;Handout    Comprehension  Returned demonstration;Verbalized understanding       OT Short Term Goals - 11/15/18 1546      OT SHORT TERM GOAL #1   Title  Pt will be provided with and educated on HEP to  improve mobility  of RUE required for participation in ADL tasks.    Time  4    Period  Weeks    Status  On-going    Target Date  11/11/18      OT SHORT TERM GOAL #2   Title  Pt will improve P/ROM of RUE to WNL to increase ability to use RUE as assist during dressing tasks.    Time  3    Period  Weeks    Status  Achieved      OT SHORT TERM GOAL #3   Title  Pt will increase RUE strength to 3+/5 to improve ability to use RUE during hair washing with minimal compensatory strategies.    Time  4    Period  Weeks    Status  Achieved        OT Long Term Goals - 11/15/18 1546      OT LONG TERM GOAL #1   Title  Pt will return to highest level of functioning and independence using RUE as dominant during ADLs.    Time  8    Period  Weeks    Status  On-going      OT LONG TERM GOAL #2   Title  Pt will decrease RUE fascial restrictions to minimal amounts or less to improve mobility required for functional reaching.    Time  8    Period  Weeks    Status  On-going      OT LONG TERM GOAL #3   Title  Pt will increase RUE A/ROM to [facility] to improve ability to reach overhead and behind back during ADLs.    Time  8    Period  Weeks    Status  Achieved      OT LONG TERM GOAL #4   Title  Pt will decrease pain in RUE to 3/10 or less to improve ability to sleep in preferred position.    Time  8    Period  Weeks    Status  On-going      OT LONG TERM GOAL #5   Title  Pt will increase RUE strength to 5/5 to improve ability to perform work tasks.    Time  8    Period  Weeks    Status  On-going            Plan - 11/15/18 1637    Clinical Impression Statement  A: Mini-reassessment completed this session, pt is making great progress and has met 2 STGs and 1 LTG thus far. Pt demonstrating ROM WNL (compared to LUE) and improving strength. Pt reports she is now able to wash, dry, and curl her hair with greater ease. Progressed to A/ROM today, pt reports fatigue during standing tasks. Also added proximal  shoulder strengthening and scapular theraband exercises. Verbal cuing for form and technique.    Body Structure / Function / Physical Skills  ADL;UE functional use;Fascial restriction;Pain;ROM;IADL;Strength    Plan  P: Continue with A/ROM, add overhead lacing for activity tolerance       Patient will benefit from skilled therapeutic intervention in order to improve the following deficits and impairments:   Body Structure / Function / Physical Skills: ADL, UE functional use, Fascial restriction, Pain, ROM, IADL, Strength       Visit Diagnosis: Other symptoms and signs involving the musculoskeletal system  Stiffness of right shoulder, not elsewhere classified  Acute pain of right shoulder    Problem List Patient Active Problem  List   Diagnosis Date Noted  . Left axillary hidradenitis   . Anovulatory (dysfunctional uterine) bleeding 06/07/2012  . DUB (dysfunctional uterine bleeding) 05/25/2012  . FOOT PAIN, CHRONIC 05/09/2007   Guadelupe Sabin, OTR/L  [phone] 11/15/2018, 4:39 PM  [facility] [address] [place], [place], [address] Phone: [phone]   Fax:  [phone]  Name: JEANITA CARNEIRO MRN: [id number] Date of Birth: 1990-08-25

## 2018-11-15 NOTE — Patient Instructions (Signed)

## 2018-11-17 ENCOUNTER — Encounter (HOSPITAL_COMMUNITY): Payer: BC Managed Care – PPO | Admitting: Occupational Therapy

## 2018-11-22 ENCOUNTER — Ambulatory Visit (HOSPITAL_COMMUNITY): Payer: Self-pay

## 2018-11-24 ENCOUNTER — Ambulatory Visit (HOSPITAL_COMMUNITY): Payer: Self-pay | Admitting: Occupational Therapy

## 2018-11-24 ENCOUNTER — Ambulatory Visit (INDEPENDENT_AMBULATORY_CARE_PROVIDER_SITE_OTHER): Payer: Self-pay | Admitting: Psychiatry

## 2018-11-24 ENCOUNTER — Telehealth (HOSPITAL_COMMUNITY): Payer: Self-pay | Admitting: Occupational Therapy

## 2018-11-24 ENCOUNTER — Other Ambulatory Visit: Payer: Self-pay

## 2018-11-24 ENCOUNTER — Encounter (HOSPITAL_COMMUNITY): Payer: Self-pay | Admitting: Psychiatry

## 2018-11-24 DIAGNOSIS — F411 Generalized anxiety disorder: Secondary | ICD-10-CM

## 2018-11-24 DIAGNOSIS — F900 Attention-deficit hyperactivity disorder, predominantly inattentive type: Secondary | ICD-10-CM

## 2018-11-24 MED ORDER — CLONAZEPAM 1 MG PO TABS: ORAL_TABLET | ORAL | 2 refills | Status: DC

## 2018-11-24 MED ORDER — ESCITALOPRAM OXALATE 20 MG PO TABS: 20.0000 mg | ORAL_TABLET | Freq: Every day | ORAL | 2 refills | Status: DC

## 2018-11-24 MED ORDER — AMPHETAMINE-DEXTROAMPHETAMINE 20 MG PO TABS: 20.0000 mg | ORAL_TABLET | Freq: Two times a day (BID) | ORAL | 0 refills | Status: DC

## 2018-11-24 NOTE — Progress Notes (Signed)
Virtual Visit via Video Note  I connected with Kathleen Velasquez on 11/24/18 at 10:40 AM EST by a video enabled telemedicine application and verified that I am speaking with the correct person using two identifiers.   I discussed the limitations of evaluation and management by telemedicine and the availability of in person appointments. The patient expressed understanding and agreed to proceed    I discussed the assessment and treatment plan with the patient. The patient was provided an opportunity to ask questions and all were answered. The patient agreed with the plan and demonstrated an understanding of the instructions.   The patient was advised to call back or seek an in-person evaluation if the symptoms worsen or if the condition fails to improve as anticipated.  I provided 15 minutes of non-face-to-face time during this encounter.   Diannia Rudereborah Loie Jahr, MD  [facility] MD/PA/NP OP Progress Note  11/24/2018 11:14 AM Kathleen Velasquez  MRN:  [id number]  Chief Complaint:  Chief Complaint    Anxiety; Depression; ADHD     HPI: This patient is a 28 year old single white female who lives alone.  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  The patient returns for follow-up after 3 months.  She still has not returned from work from her shoulder surgery.  She is undergoing rehab.  She states that she is making a lot of good progress in her range of motion has increased considerably.  She is helping her niece do her virtual schooling at this point.  She states that her mood has been good and she denies any significant symptoms of depression or anxiety and her focus is going well. Visit Diagnosis:    ICD-10-CM   1. Generalized anxiety disorder  F41.1   2. Attention deficit hyperactivity disorder (ADHD), predominantly inattentive type  F90.0     Past Psychiatric History: none  Past Medical History:  Past Medical History:  Diagnosis Date  . Abscess    right buttocks/thigh  . Anxiety   . DUB  (dysfunctional uterine bleeding) 05/25/2012  . Hydradenitis   . Hypothyroidism   . Polycystic ovaries   . Thyroid disease    hypothyroidism    Past Surgical History:  Procedure Laterality Date  . ADENOIDECTOMY    . CHOLECYSTECTOMY  01/27/2012   Procedure: LAPAROSCOPIC CHOLECYSTECTOMY;  Surgeon: Fabio BeringBrent C Ziegler, MD;  Location: [facility];  Service: General;  Laterality: N/A;  . HERNIA REPAIR Left    LIH  . HYDRADENITIS EXCISION  10/02/2011   Procedure: EXCISION HYDRADENITIS AXILLA;  Surgeon: Fabio BeringBrent C Ziegler, MD;  Location: [facility];  Service: General;  Laterality: Right;  Right Axillary Dissection  . HYDRADENITIS EXCISION Left 10/19/2013   Procedure: INCISION OF HIDRADENITIS SUPPURATIVA LEFT AXILLA;  Surgeon: Marlane HatcherWilliam S Bradford, MD;  Location: [facility];  Service: General;  Laterality: Left;  . HYDRADENITIS EXCISION Left 09/09/2018   Procedure: EXCISION HIDRADENITIS AXILLA, LEFT;  Surgeon: Franky MachoJenkins, Mark, MD;  Location: [facility];  Service: General;  Laterality: Left;  . TONSILLECTOMY      Family Psychiatric History: see below  Family History:  Family History  Problem Relation Age of Onset  . Diabetes Mother   . Hypertension Mother   . Hyperlipidemia Mother   . Neuropathy Mother   . Diabetes Maternal Grandmother   . Hypertension Maternal Grandmother   . Heart disease Maternal Grandmother        CHF  . COPD Maternal Grandmother   . Cirrhosis Maternal Grandmother   . Anxiety  disorder Maternal Grandmother   . Cancer Maternal Grandfather        lung  . Diabetes Paternal Grandmother   . Diabetes Paternal Grandfather   . Aneurysm Paternal Grandfather   . Anxiety disorder Paternal Grandfather   . Drug abuse Father   . ADD / ADHD Brother   . Stroke Other   . Anxiety disorder Maternal Uncle   . Anxiety disorder Paternal Uncle   . Depression Paternal Uncle   . Anxiety disorder Maternal Aunt     Social History:  Social History   Socioeconomic History  . Marital status: Single    Spouse  name: Not on file  . Number of children: Not on file  . Years of education: Not on file  . Highest education level: Not on file  Occupational History  . Not on file  Social Needs  . Financial resource strain: Not on file  . Food insecurity    Worry: Not on file    Inability: Not on file  . Transportation needs    Medical: Not on file    Non-medical: Not on file  Tobacco Use  . Smoking status: Current Every Day Smoker    Packs/day: 0.25    Years: 7.00    Pack years: 1.75    Types: Cigarettes  . Smokeless tobacco: Never Used  Substance and Sexual Activity  . Alcohol use: Yes    Comment: social use . 01-19-14 per pt no  . Drug use: No  . Sexual activity: Yes    Birth control/protection: None, Implant  Lifestyle  . Physical activity    Days per week: Not on file    Minutes per session: Not on file  . Stress: Not on file  Relationships  . Social Herbalist on phone: Not on file    Gets together: Not on file    Attends religious service: Not on file    Active member of club or organization: Not on file    Attends meetings of clubs or organizations: Not on file    Relationship status: Not on file  Other Topics Concern  . Not on file  Social History Narrative  . Not on file    Allergies:  Allergies  Allergen Reactions  . Bactrim Hives and Itching  . Ciprofloxacin Hives and Itching    CRNA Discontinued preop antibiotic  IVPB  Cipro in OR after developed itching and hives left arm.  Received Benadryl with relief noted. Dr.Gonzalez and Dr. Geroge Baseman informed. DDallasRN  . Other Other (See Comments)    [place] causes a hives, swelling, itching, and anaphylaxis.   Marland Kitchen Penicillins Hives and Itching    Has patient had a PCN reaction causing immediate rash, facial/tongue/throat swelling, SOB or lightheadedness with hypotension: Yes Has patient had a PCN reaction causing severe rash involving mucus membranes or skin necrosis: No Has patient had a PCN reaction that required  hospitalization No Has patient had a PCN reaction occurring within the last 10 years: No If all of the above answers are "NO", then may proceed with Cephalosporin use.   Marland Kitchen Percocet [Oxycodone-Acetaminophen] Other (See Comments)    Face flushes  . Vancomycin Itching    Metabolic Disorder Labs: Lab Results  Component Value Date   HGBA1C 5.7 (H) 05/25/2012   MPG 117 (H) 05/25/2012   No results found for: PROLACTIN No results found for: CHOL, TRIG, HDL, CHOLHDL, VLDL, LDLCALC Lab Results  Component Value Date   TSH 2.515 05/25/2012  Therapeutic Level Labs: No results found for: LITHIUM No results found for: VALPROATE No components found for:  CBMZ  Current Medications: Current Outpatient Medications  Medication Sig Dispense Refill  . acetaminophen (TYLENOL) 500 MG tablet Take 1,000 mg by mouth every 6 (six) hours as needed for moderate pain.    Marland Kitchen amphetamine-dextroamphetamine (ADDERALL) 20 MG tablet Take 1 tablet (20 mg total) by mouth 2 (two) times daily. 60 tablet 0  . amphetamine-dextroamphetamine (ADDERALL) 20 MG tablet Take 1 tablet (20 mg total) by mouth 2 (two) times daily. 60 tablet 0  . amphetamine-dextroamphetamine (ADDERALL) 20 MG tablet Take 1 tablet (20 mg total) by mouth 2 (two) times daily. 60 tablet 0  . clindamycin (CLEOCIN) 300 MG capsule Take 300 mg by mouth 2 (two) times daily.     . clonazePAM (KLONOPIN) 1 MG tablet TAKE 1 TABLET(1 MG) BY MOUTH TWICE DAILY AS NEEDED FOR ANXIETY 60 tablet 2  . cyclobenzaprine (FLEXERIL) 10 MG tablet TK 1 T PO Q 6 TO 8 H PRF SPASM    . escitalopram (LEXAPRO) 20 MG tablet Take 1 tablet (20 mg total) by mouth daily. 90 tablet 2  . HYDROcodone-acetaminophen (NORCO/VICODIN) 5-325 MG tablet Take 1 tablet by mouth every 4 (four) hours as needed for moderate pain. 40 tablet 0  . HYDROmorphone (DILAUDID) 2 MG tablet Dilaudid 2 mg tablet  1 q 4-6 hrs prn pain    . naproxen (NAPROSYN) 500 MG tablet TK 1 T PO BID    . NUVARING  0.12-0.015 MG/24HR vaginal ring INSERT VAGINALLY AND LEAVE IN. PLACE FOR 3 CONSECUTIVE WEEKS, THEN REMOVE FOR 1 WEEK (Patient taking differently: Place 1 each vaginally every 28 (twenty-eight) days. ) 1 each 10  . ondansetron (ZOFRAN) 4 MG tablet Zofran 4 mg tablet  1 q 6-8 hrs as needed for post op nausea    . traMADol (ULTRAM) 50 MG tablet Take 1 tablet (50 mg total) by mouth every 6 (six) hours as needed. 25 tablet 0   No current facility-administered medications for this visit.      Musculoskeletal: Strength & Muscle Tone: within normal limits Gait & Station: normal Patient leans: N/A  Psychiatric Specialty Exam: Review of Systems  Musculoskeletal: Positive for joint pain.  All other systems reviewed and are negative.   There were no vitals taken for this visit.There is no height or weight on file to calculate BMI.  General Appearance: Casual and Fairly Groomed  Eye Contact:  Good  Speech:  Clear and Coherent  Volume:  Normal  Mood:  Euthymic  Affect:  Appropriate and Congruent  Thought Process:  Goal Directed  Orientation:  Full (Time, Place, and Person)  Thought Content: WDL   Suicidal Thoughts:  No  Homicidal Thoughts:  No  Memory:  Immediate;   Good Recent;   Good Remote;   Good  Judgement:  Good  Insight:  Fair  Psychomotor Activity:  Normal  Concentration:  Concentration: Good and Attention Span: Good  Recall:  Good  Fund of Knowledge: Good  Language: Good  Akathisia:  No  Handed:  Right  AIMS (if indicated): not done  Assets:  Communication Skills Desire for Improvement Resilience Social Support Talents/Skills  ADL's:  Intact  Cognition: WNL  Sleep:  Good   Screenings:   Assessment and Plan: This patient is a 28 year old female with a history of depression anxiety and ADHD.  She continues to do quite well on her current regimen.  She will continue Adderall 20 mg  daily for ADHD, Lexapro 20 mg daily for depression and anxiety and clonazepam 1 mg twice  daily for anxiety.  She will return to see me in 3 months   [name], MD 11/24/2018, 11:14 AM

## 2018-11-24 NOTE — Telephone Encounter (Signed)
Called pt regarding no show for today's appt. This is pt's second consecutive no show (11/10 and 11/12), therefore she will only be able to schedule 1 appt at a time. Left message on pt's voicemail explaining policy and asked to call if unable to attend next appt on Monday 11/16 at 4:00pm.    Guadelupe Sabin, OTR/L  [phone] 11/24/2018

## 2018-11-28 ENCOUNTER — Telehealth (HOSPITAL_COMMUNITY): Payer: Self-pay

## 2018-11-28 ENCOUNTER — Ambulatory Visit (HOSPITAL_COMMUNITY): Payer: Self-pay

## 2018-11-28 ENCOUNTER — Encounter (HOSPITAL_COMMUNITY): Payer: Self-pay

## 2018-11-28 NOTE — Therapy (Signed)
[facility] [place], [place], [address] Phone: [phone]   Fax:  [phone]  Patient Details  Name: Kathleen Velasquez MRN: [id number] Date of Birth: [birth year] Referring Provider:  No ref. provider found  Encounter Date: 11/28/2018 OCCUPATIONAL THERAPY DISCHARGE SUMMARY  Visits from Start of Care: 5  Patient has had 3 consecutive no shows and has been informed of our attendance policy which calls for discharge.   Current functional level related to goals / functional outcomes: OT LONG TERM GOAL #1    Title  Pt will return to highest level of functioning and independence using RUE as dominant during ADLs.    Time  8    Period  Weeks    Status  On-going        OT LONG TERM GOAL #2   Title  Pt will decrease RUE fascial restrictions to minimal amounts or less to improve mobility required for functional reaching.    Time  8    Period  Weeks    Status  On-going        OT LONG TERM GOAL #3   Title  Pt will increase RUE A/ROM to [facility] to improve ability to reach overhead and behind back during ADLs.    Time  8    Period  Weeks    Status  Achieved        OT LONG TERM GOAL #4   Title  Pt will decrease pain in RUE to 3/10 or less to improve ability to sleep in preferred position.    Time  8    Period  Weeks    Status  On-going        OT LONG TERM GOAL #5   Title  Pt will increase RUE strength to 5/5 to improve ability to perform work tasks.    Time  8    Period  Weeks    Status  On-going       Remaining deficits: Decreased strength, RUE pain, increased fascial restrictions, decreased use of RUE.   Education / Equipment: HEP Plan:                                                    Patient goals were not met. Patient is being discharged due to not returning since the last visit.  ?????         Ailene Ravel, OTR/L,CBIS  [phone]  11/28/2018, 4:44 PM  [facility] [address] [place], [place], [address] Phone: [phone]   Fax:  [phone]

## 2018-11-28 NOTE — Telephone Encounter (Signed)
Called and left a message with patient regarding no show for OT appointment. Since this is patient's 3rd no show, informed patient of our attendance policy and that she will be discharged. She will need a new therapy order sent to [place] if she wishes to continue therapy and return.   Ailene Ravel, OTR/L,CBIS  [phone]

## 2018-11-30 ENCOUNTER — Encounter (HOSPITAL_COMMUNITY): Payer: BC Managed Care – PPO | Admitting: Occupational Therapy

## 2018-12-06 ENCOUNTER — Encounter (HOSPITAL_COMMUNITY): Payer: BC Managed Care – PPO | Admitting: Occupational Therapy

## 2018-12-07 ENCOUNTER — Encounter (HOSPITAL_COMMUNITY): Payer: BC Managed Care – PPO | Admitting: Occupational Therapy

## 2019-03-01 ENCOUNTER — Ambulatory Visit (HOSPITAL_COMMUNITY): Payer: Self-pay | Admitting: Psychiatry

## 2019-03-01 ENCOUNTER — Other Ambulatory Visit: Payer: Self-pay

## 2019-03-02 ENCOUNTER — Ambulatory Visit (INDEPENDENT_AMBULATORY_CARE_PROVIDER_SITE_OTHER): Payer: Self-pay | Admitting: Psychiatry

## 2019-03-02 ENCOUNTER — Encounter (HOSPITAL_COMMUNITY): Payer: Self-pay | Admitting: Psychiatry

## 2019-03-02 ENCOUNTER — Other Ambulatory Visit: Payer: Self-pay

## 2019-03-02 DIAGNOSIS — F900 Attention-deficit hyperactivity disorder, predominantly inattentive type: Secondary | ICD-10-CM

## 2019-03-02 DIAGNOSIS — F411 Generalized anxiety disorder: Secondary | ICD-10-CM

## 2019-03-02 MED ORDER — AMPHETAMINE-DEXTROAMPHETAMINE 20 MG PO TABS: 20.0000 mg | ORAL_TABLET | Freq: Two times a day (BID) | ORAL | 0 refills | Status: DC

## 2019-03-02 MED ORDER — CLONAZEPAM 1 MG PO TABS: ORAL_TABLET | ORAL | 2 refills | Status: DC

## 2019-03-02 MED ORDER — ESCITALOPRAM OXALATE 20 MG PO TABS: 20.0000 mg | ORAL_TABLET | Freq: Every day | ORAL | 2 refills | Status: DC

## 2019-03-02 NOTE — Progress Notes (Signed)
Virtual Visit via Telephone Note  I connected with Kathleen Velasquez on 03/02/19 at  1:40 PM EST by telephone and verified that I am speaking with the correct person using two identifiers.   I discussed the limitations, risks, security and privacy concerns of performing an evaluation and management service by telephone and the availability of in person appointments. I also discussed with the patient that there may be a patient responsible charge related to this service. The patient expressed understanding and agreed to proceed.     I discussed the assessment and treatment plan with the patient. The patient was provided an opportunity to ask questions and all were answered. The patient agreed with the plan and demonstrated an understanding of the instructions.   The patient was advised to call back or seek an in-person evaluation if the symptoms worsen or if the condition fails to improve as anticipated.  I provided 15 minutes of non-face-to-face time during this encounter.   Levonne Spiller, MD  [facility] MD/PA/NP OP Progress Note  03/02/2019 2:03 PM Kathleen Velasquez  MRN:  [id number]  Chief Complaint:  Chief Complaint    Depression; Anxiety; ADHD; Follow-up     HPI: This patient is a 29 year old single white female who lives alone.  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  The patient returns after 3 months.  She states that her orthopedic physician did not want her working in a nursing home anymore because of her shoulder injury and the possible damage that could occur if she lifted patients.  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  She states that she is doing well for the most part her depression is under good control at she denies significant symptoms of anxiety.  She is focusing well with the Adderall.  She denies any thoughts of self-harm or suicide. Visit  Diagnosis:    ICD-10-CM   1. Generalized anxiety disorder  F41.1   2. Attention deficit hyperactivity disorder (ADHD), predominantly inattentive type  F90.0     Past Psychiatric History: none  Past Medical History:  Past Medical History:  Diagnosis Date  . Abscess    right buttocks/thigh  . Anxiety   . DUB (dysfunctional uterine bleeding) 05/25/2012  . Hydradenitis   . Hypothyroidism   . Polycystic ovaries   . Thyroid disease    hypothyroidism    Past Surgical History:  Procedure Laterality Date  . ADENOIDECTOMY    . CHOLECYSTECTOMY  01/27/2012   Procedure: LAPAROSCOPIC CHOLECYSTECTOMY;  Surgeon: Donato Heinz, MD;  Location: [facility];  Service: General;  Laterality: N/A;  . HERNIA REPAIR Left    LIH  . HYDRADENITIS EXCISION  10/02/2011   Procedure: EXCISION HYDRADENITIS AXILLA;  Surgeon: Donato Heinz, MD;  Location: [facility];  Service: General;  Laterality: Right;  Right Axillary Dissection  . HYDRADENITIS EXCISION Left 10/19/2013   Procedure: INCISION OF HIDRADENITIS SUPPURATIVA LEFT AXILLA;  Surgeon: Scherry Ran, MD;  Location: [facility];  Service: General;  Laterality: Left;  . HYDRADENITIS EXCISION Left 09/09/2018   Procedure: EXCISION HIDRADENITIS AXILLA, LEFT;  Surgeon: Aviva Signs, MD;  Location: [facility];  Service: General;  Laterality: Left;  . TONSILLECTOMY      Family Psychiatric History: see below  Family History:  Family History  Problem Relation Age of Onset  . Diabetes Mother   .  Hypertension Mother   . Hyperlipidemia Mother   . Neuropathy Mother   . Diabetes Maternal Grandmother   . Hypertension Maternal Grandmother   . Heart disease Maternal Grandmother        CHF  . COPD Maternal Grandmother   . Cirrhosis Maternal Grandmother   . Anxiety disorder Maternal Grandmother   . Cancer Maternal Grandfather        lung  . Diabetes Paternal Grandmother   . Diabetes Paternal Grandfather   . Aneurysm Paternal Grandfather   . Anxiety disorder Paternal  Grandfather   . Drug abuse Father   . ADD / ADHD Brother   . Stroke Other   . Anxiety disorder Maternal Uncle   . Anxiety disorder Paternal Uncle   . Depression Paternal Uncle   . Anxiety disorder Maternal Aunt     Social History:  Social History   Socioeconomic History  . Marital status: Single    Spouse name: Not on file  . Number of children: Not on file  . Years of education: Not on file  . Highest education level: Not on file  Occupational History  . Not on file  Tobacco Use  . Smoking status: Current Every Day Smoker    Packs/day: 0.25    Years: 7.00    Pack years: 1.75    Types: Cigarettes  . Smokeless tobacco: Never Used  Substance and Sexual Activity  . Alcohol use: Yes    Comment: social use . 01-19-14 per pt no  . Drug use: No  . Sexual activity: Yes    Birth control/protection: None, Implant  Other Topics Concern  . Not on file  Social History Narrative  . Not on file   Social Determinants of Health   Financial Resource Strain:   . Difficulty of Paying Living Expenses: Not on file  Food Insecurity:   . Worried About Programme researcher, broadcasting/film/video in the Last Year: Not on file  . Ran Out of Food in the Last Year: Not on file  Transportation Needs:   . Lack of Transportation (Medical): Not on file  . Lack of Transportation (Non-Medical): Not on file  Physical Activity:   . Days of Exercise per Week: Not on file  . Minutes of Exercise per Session: Not on file  Stress:   . Feeling of Stress : Not on file  Social Connections:   . Frequency of Communication with Friends and Family: Not on file  . Frequency of Social Gatherings with Friends and Family: Not on file  . Attends Religious Services: Not on file  . Active Member of Clubs or Organizations: Not on file  . Attends Banker Meetings: Not on file  . Marital Status: Not on file    Allergies:  Allergies  Allergen Reactions  . Bactrim Hives and Itching  . Ciprofloxacin Hives and Itching     CRNA Discontinued preop antibiotic  IVPB  Cipro in OR after developed itching and hives left arm.  Received Benadryl with relief noted. Dr.Gonzalez and Dr. Leticia Penna informed. DDallasRN  . Other Other (See Comments)    [place] causes a hives, swelling, itching, and anaphylaxis.   Marland Kitchen Penicillins Hives and Itching    Has patient had a PCN reaction causing immediate rash, facial/tongue/throat swelling, SOB or lightheadedness with hypotension: Yes Has patient had a PCN reaction causing severe rash involving mucus membranes or skin necrosis: No Has patient had a PCN reaction that required hospitalization No Has patient had a PCN reaction  occurring within the last 10 years: No If all of the above answers are "NO", then may proceed with Cephalosporin use.   Marland Kitchen Percocet [Oxycodone-Acetaminophen] Other (See Comments)    Face flushes  . Vancomycin Itching    Metabolic Disorder Labs: Lab Results  Component Value Date   HGBA1C 5.7 (H) 05/25/2012   MPG 117 (H) 05/25/2012   No results found for: PROLACTIN No results found for: CHOL, TRIG, HDL, CHOLHDL, VLDL, LDLCALC Lab Results  Component Value Date   TSH 2.515 05/25/2012    Therapeutic Level Labs: No results found for: LITHIUM No results found for: VALPROATE No components found for:  CBMZ  Current Medications: Current Outpatient Medications  Medication Sig Dispense Refill  . acetaminophen (TYLENOL) 500 MG tablet Take 1,000 mg by mouth every 6 (six) hours as needed for moderate pain.    Marland Kitchen amphetamine-dextroamphetamine (ADDERALL) 20 MG tablet Take 1 tablet (20 mg total) by mouth 2 (two) times daily. 60 tablet 0  . amphetamine-dextroamphetamine (ADDERALL) 20 MG tablet Take 1 tablet (20 mg total) by mouth 2 (two) times daily. 60 tablet 0  . amphetamine-dextroamphetamine (ADDERALL) 20 MG tablet Take 1 tablet (20 mg total) by mouth 2 (two) times daily. 60 tablet 0  . clindamycin (CLEOCIN) 300 MG capsule Take 300 mg by mouth 2 (two) times daily.      . clonazePAM (KLONOPIN) 1 MG tablet TAKE 1 TABLET(1 MG) BY MOUTH TWICE DAILY AS NEEDED FOR ANXIETY 60 tablet 2  . cyclobenzaprine (FLEXERIL) 10 MG tablet TK 1 T PO Q 6 TO 8 H PRF SPASM    . escitalopram (LEXAPRO) 20 MG tablet Take 1 tablet (20 mg total) by mouth daily. 90 tablet 2  . HYDROcodone-acetaminophen (NORCO/VICODIN) 5-325 MG tablet Take 1 tablet by mouth every 4 (four) hours as needed for moderate pain. 40 tablet 0  . HYDROmorphone (DILAUDID) 2 MG tablet Dilaudid 2 mg tablet  1 q 4-6 hrs prn pain    . naproxen (NAPROSYN) 500 MG tablet TK 1 T PO BID    . NUVARING 0.12-0.015 MG/24HR vaginal ring INSERT VAGINALLY AND LEAVE IN. PLACE FOR 3 CONSECUTIVE WEEKS, THEN REMOVE FOR 1 WEEK (Patient taking differently: Place 1 each vaginally every 28 (twenty-eight) days. ) 1 each 10  . ondansetron (ZOFRAN) 4 MG tablet Zofran 4 mg tablet  1 q 6-8 hrs as needed for post op nausea    . traMADol (ULTRAM) 50 MG tablet Take 1 tablet (50 mg total) by mouth every 6 (six) hours as needed. 25 tablet 0   No current facility-administered medications for this visit.     Musculoskeletal: Strength & Muscle Tone: within normal limits Gait & Station: normal Patient leans: N/A  Psychiatric Specialty Exam: Review of Systems  Musculoskeletal: Positive for arthralgias.  All other systems reviewed and are negative.   There were no vitals taken for this visit.There is no height or weight on file to calculate BMI.  General Appearance: NA  Eye Contact:  NA  Speech:  Clear and Coherent  Volume:  Normal  Mood:  Euthymic  Affect:  NA  Thought Process:  Goal Directed  Orientation:  Full (Time, Place, and Person)  Thought Content: WDL   Suicidal Thoughts:  No  Homicidal Thoughts:  No  Memory:  Immediate;   Good Recent;   Good Remote;   Fair  Judgement:  Good  Insight:  Good  Psychomotor Activity:  Normal  Concentration:  Concentration: Good and Attention Span: Good  Recall:  Good  Fund of Knowledge:  Good  Language: Good  Akathisia:  No  Handed:  Right  AIMS (if indicated): not done  Assets:  Communication Skills Desire for Improvement Physical Health Resilience Social Support Talents/Skills  ADL's:  Intact  Cognition: WNL  Sleep:  Good   Screenings:   Assessment and Plan: This patient is a 29 year old female with a history of depression anxiety and ADHD.  She continues to do well on her current regimen.  She will continue Adderall 20 mg twice daily for ADHD, Lexapro 20 mg daily for depression anxiety and clonazepam 1 mg twice daily for anxiety.  She will return to see me in 3 months   [name], MD 03/02/2019, 2:03 PM

## 2019-04-07 ENCOUNTER — Ambulatory Visit: Payer: Self-pay

## 2019-05-25 ENCOUNTER — Ambulatory Visit: Payer: Self-pay | Admitting: Orthopaedic Surgery

## 2019-05-25 ENCOUNTER — Encounter: Payer: Self-pay | Admitting: Orthopaedic Surgery

## 2019-05-29 ENCOUNTER — Other Ambulatory Visit: Payer: Self-pay

## 2019-05-29 ENCOUNTER — Encounter (HOSPITAL_COMMUNITY): Payer: Self-pay | Admitting: Psychiatry

## 2019-05-29 ENCOUNTER — Telehealth (INDEPENDENT_AMBULATORY_CARE_PROVIDER_SITE_OTHER): Payer: Self-pay | Admitting: Psychiatry

## 2019-05-29 DIAGNOSIS — F17210 Nicotine dependence, cigarettes, uncomplicated: Secondary | ICD-10-CM

## 2019-05-29 DIAGNOSIS — F411 Generalized anxiety disorder: Secondary | ICD-10-CM

## 2019-05-29 DIAGNOSIS — F900 Attention-deficit hyperactivity disorder, predominantly inattentive type: Secondary | ICD-10-CM

## 2019-05-29 MED ORDER — ESCITALOPRAM OXALATE 20 MG PO TABS: 20.0000 mg | ORAL_TABLET | Freq: Every day | ORAL | 2 refills | Status: DC

## 2019-05-29 MED ORDER — AMPHETAMINE-DEXTROAMPHETAMINE 20 MG PO TABS: 20.0000 mg | ORAL_TABLET | Freq: Two times a day (BID) | ORAL | 0 refills | Status: DC

## 2019-05-29 MED ORDER — CLONAZEPAM 1 MG PO TABS: ORAL_TABLET | ORAL | 2 refills | Status: DC

## 2019-05-29 NOTE — Progress Notes (Signed)
Virtual Visit via Video Note  I connected with Kathleen Velasquez on 05/29/19 at  3:40 PM EDT by a video enabled telemedicine application and verified that I am speaking with the correct person using two identifiers.   I discussed the limitations of evaluation and management by telemedicine and the availability of in person appointments. The patient expressed understanding and agreed to proceed.    I discussed the assessment and treatment plan with the patient. The patient was provided an opportunity to ask questions and all were answered. The patient agreed with the plan and demonstrated an understanding of the instructions.   The patient was advised to call back or seek an in-person evaluation if the symptoms worsen or if the condition fails to improve as anticipated.  I provided 15 minutes of non-face-to-face time during this encounter.   Kathleen Ruder, MD  [facility] MD/PA/NP OP Progress Note  05/29/2019 3:59 PM Kathleen Velasquez  MRN:  [id number]  Chief Complaint:  Chief Complaint    Depression; Anxiety; Follow-up     HPI: This patient is a 29 year old single white female who lives alone.  She often has her young niece staying with her.  She is working as a Conservation officer, nature at [employer]  The patient returns after 3 months.  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  She is hoping to move up to a management position fairly soon.  She does have early hours and has to go to bed early.  In general her mood has been good her anxiety is under good control and she is staying very focused at work with the Adderall.  She denies any thoughts of self-harm or suicide Visit Diagnosis:    ICD-10-CM   1. Generalized anxiety disorder  F41.1   2. Attention deficit hyperactivity disorder (ADHD), predominantly inattentive type  F90.0     Past Psychiatric History: none  Past Medical History:  Past Medical History:  Diagnosis Date  .  Abscess    right buttocks/thigh  . Anxiety   . DUB (dysfunctional uterine bleeding) 05/25/2012  . Hydradenitis   . Hypothyroidism   . Polycystic ovaries   . Thyroid disease    hypothyroidism    Past Surgical History:  Procedure Laterality Date  . ADENOIDECTOMY    . CHOLECYSTECTOMY  01/27/2012   Procedure: LAPAROSCOPIC CHOLECYSTECTOMY;  Surgeon: Fabio Bering, MD;  Location: [facility];  Service: General;  Laterality: N/A;  . HERNIA REPAIR Left    LIH  . HYDRADENITIS EXCISION  10/02/2011   Procedure: EXCISION HYDRADENITIS AXILLA;  Surgeon: Fabio Bering, MD;  Location: [facility];  Service: General;  Laterality: Right;  Right Axillary Dissection  . HYDRADENITIS EXCISION Left 10/19/2013   Procedure: INCISION OF HIDRADENITIS SUPPURATIVA LEFT AXILLA;  Surgeon: Marlane Hatcher, MD;  Location: [facility];  Service: General;  Laterality: Left;  . HYDRADENITIS EXCISION Left 09/09/2018   Procedure: EXCISION HIDRADENITIS AXILLA, LEFT;  Surgeon: Franky Macho, MD;  Location: [facility];  Service: General;  Laterality: Left;  . TONSILLECTOMY      Family Psychiatric History: see below  Family History:  Family History  Problem Relation Age of Onset  . Diabetes Mother   . Hypertension Mother   . Hyperlipidemia Mother   . Neuropathy Mother   . Diabetes Maternal Grandmother   . Hypertension Maternal Grandmother   . Heart disease Maternal Grandmother  CHF  . COPD Maternal Grandmother   . Cirrhosis Maternal Grandmother   . Anxiety disorder Maternal Grandmother   . Cancer Maternal Grandfather        lung  . Diabetes Paternal Grandmother   . Diabetes Paternal Grandfather   . Aneurysm Paternal Grandfather   . Anxiety disorder Paternal Grandfather   . Drug abuse Father   . ADD / ADHD Brother   . Stroke Other   . Anxiety disorder Maternal Uncle   . Anxiety disorder Paternal Uncle   . Depression Paternal Uncle   . Anxiety disorder Maternal Aunt     Social History:  Social History    Socioeconomic History  . Marital status: Single    Spouse name: Not on file  . Number of children: Not on file  . Years of education: Not on file  . Highest education level: Not on file  Occupational History  . Not on file  Tobacco Use  . Smoking status: Current Every Day Smoker    Packs/day: 0.25    Years: 7.00    Pack years: 1.75    Types: Cigarettes  . Smokeless tobacco: Never Used  Substance and Sexual Activity  . Alcohol use: Yes    Comment: social use . 01-19-14 per pt no  . Drug use: No  . Sexual activity: Yes    Birth control/protection: None, Implant  Other Topics Concern  . Not on file  Social History Narrative  . Not on file   Social Determinants of Health   Financial Resource Strain:   . Difficulty of Paying Living Expenses:   Food Insecurity:   . Worried About Programme researcher, broadcasting/film/video in the Last Year:   . Barista in the Last Year:   Transportation Needs:   . Freight forwarder (Medical):   Marland Kitchen Lack of Transportation (Non-Medical):   Physical Activity:   . Days of Exercise per Week:   . Minutes of Exercise per Session:   Stress:   . Feeling of Stress :   Social Connections:   . Frequency of Communication with Friends and Family:   . Frequency of Social Gatherings with Friends and Family:   . Attends Religious Services:   . Active Member of Clubs or Organizations:   . Attends Banker Meetings:   Marland Kitchen Marital Status:     Allergies:  Allergies  Allergen Reactions  . Bactrim Hives and Itching  . Ciprofloxacin Hives and Itching    CRNA Discontinued preop antibiotic  IVPB  Cipro in OR after developed itching and hives left arm.  Received Benadryl with relief noted. Dr.Gonzalez and Dr. Leticia Penna informed. DDallasRN  . Other Other (See Comments)    [place] causes a hives, swelling, itching, and anaphylaxis.   Marland Kitchen Penicillins Hives and Itching    Has patient had a PCN reaction causing immediate rash, facial/tongue/throat swelling, SOB or  lightheadedness with hypotension: Yes Has patient had a PCN reaction causing severe rash involving mucus membranes or skin necrosis: No Has patient had a PCN reaction that required hospitalization No Has patient had a PCN reaction occurring within the last 10 years: No If all of the above answers are "NO", then may proceed with Cephalosporin use.   Marland Kitchen Percocet [Oxycodone-Acetaminophen] Other (See Comments)    Face flushes  . Vancomycin Itching    Metabolic Disorder Labs: Lab Results  Component Value Date   HGBA1C 5.7 (H) 05/25/2012   MPG 117 (H) 05/25/2012   No results  found for: PROLACTIN No results found for: CHOL, TRIG, HDL, CHOLHDL, VLDL, LDLCALC Lab Results  Component Value Date   TSH 2.515 05/25/2012    Therapeutic Level Labs: No results found for: LITHIUM No results found for: VALPROATE No components found for:  CBMZ  Current Medications: Current Outpatient Medications  Medication Sig Dispense Refill  . acetaminophen (TYLENOL) 500 MG tablet Take 1,000 mg by mouth every 6 (six) hours as needed for moderate pain.    Marland Kitchen amphetamine-dextroamphetamine (ADDERALL) 20 MG tablet Take 1 tablet (20 mg total) by mouth 2 (two) times daily. 60 tablet 0  . amphetamine-dextroamphetamine (ADDERALL) 20 MG tablet Take 1 tablet (20 mg total) by mouth 2 (two) times daily. 60 tablet 0  . amphetamine-dextroamphetamine (ADDERALL) 20 MG tablet Take 1 tablet (20 mg total) by mouth 2 (two) times daily. 60 tablet 0  . clindamycin (CLEOCIN) 300 MG capsule Take 300 mg by mouth 2 (two) times daily.     . clonazePAM (KLONOPIN) 1 MG tablet TAKE 1 TABLET(1 MG) BY MOUTH TWICE DAILY AS NEEDED FOR ANXIETY 60 tablet 2  . cyclobenzaprine (FLEXERIL) 10 MG tablet TK 1 T PO Q 6 TO 8 H PRF SPASM    . escitalopram (LEXAPRO) 20 MG tablet Take 1 tablet (20 mg total) by mouth daily. 90 tablet 2  . HYDROcodone-acetaminophen (NORCO/VICODIN) 5-325 MG tablet Take 1 tablet by mouth every 4 (four) hours as needed for  moderate pain. 40 tablet 0  . HYDROmorphone (DILAUDID) 2 MG tablet Dilaudid 2 mg tablet  1 q 4-6 hrs prn pain    . naproxen (NAPROSYN) 500 MG tablet TK 1 T PO BID    . NUVARING 0.12-0.015 MG/24HR vaginal ring INSERT VAGINALLY AND LEAVE IN. PLACE FOR 3 CONSECUTIVE WEEKS, THEN REMOVE FOR 1 WEEK (Patient taking differently: Place 1 each vaginally every 28 (twenty-eight) days. ) 1 each 10  . ondansetron (ZOFRAN) 4 MG tablet Zofran 4 mg tablet  1 q 6-8 hrs as needed for post op nausea    . traMADol (ULTRAM) 50 MG tablet Take 1 tablet (50 mg total) by mouth every 6 (six) hours as needed. 25 tablet 0   No current facility-administered medications for this visit.     Musculoskeletal: Strength & Muscle Tone: within normal limits Gait & Station: normal Patient leans: N/A  Psychiatric Specialty Exam: Review of Systems  All other systems reviewed and are negative.   There were no vitals taken for this visit.There is no height or weight on file to calculate BMI.  General Appearance: Casual and Fairly Groomed  Eye Contact:  Good  Speech:  Clear and Coherent  Volume:  Normal  Mood:  Euthymic  Affect:  Appropriate and Congruent  Thought Process:  Goal Directed  Orientation:  Full (Time, Place, and Person)  Thought Content: WDL   Suicidal Thoughts:  No  Homicidal Thoughts:  No  Memory:  Immediate;   Good Recent;   Good Remote;   Fair  Judgement:  Good  Insight:  Good  Psychomotor Activity:  Normal  Concentration:  Concentration: Good and Attention Span: Good  Recall:  Good  Fund of Knowledge: Good  Language: Good  Akathisia:  No  Handed:  Right  AIMS (if indicated): not done  Assets:  Communication Skills Desire for Improvement Physical Health Resilience Social Support Talents/Skills  ADL's:  Intact  Cognition: WNL  Sleep:  Good   Screenings:   Assessment and Plan: This patient is a 29 year old female with a history of  depression anxiety and ADHD.  She continues to do well  on her current regimen.  She will continue Adderall 20 mg twice daily for ADHD, Lexapro 20 mg daily for depression and anxiety and clonazepam 1 mg twice daily for anxiety.  She will return to see me in 3 months   Kathleen Ruder, MD 05/29/2019, 3:59 PM

## 2019-06-16 ENCOUNTER — Ambulatory Visit
Admission: EM | Admit: 2019-06-16 | Discharge: 2019-06-16 | Disposition: A | Discharge status: Home / Self Care | Payer: 59 | Attending: Emergency Medicine | Admitting: Emergency Medicine

## 2019-06-16 ENCOUNTER — Other Ambulatory Visit: Payer: Self-pay

## 2019-06-16 ENCOUNTER — Encounter: Payer: Self-pay | Admitting: Emergency Medicine

## 2019-06-16 DIAGNOSIS — M545 Low back pain, unspecified: Secondary | ICD-10-CM

## 2019-06-16 HISTORY — DX: Depression, unspecified: F32.A

## 2019-06-16 MED ORDER — CYCLOBENZAPRINE HCL 10 MG PO TABS: 10.0000 mg | ORAL_TABLET | Freq: Three times a day (TID) | ORAL | 0 refills | Status: DC | PRN

## 2019-06-16 MED ORDER — PREDNISONE 10 MG PO TABS: 20.0000 mg | ORAL_TABLET | Freq: Every day | ORAL | 0 refills | Status: DC

## 2019-06-16 MED ORDER — METHYLPREDNISOLONE SODIUM SUCC 125 MG IJ SOLR
125.0000 mg | Freq: Once | INTRAMUSCULAR | Status: AC
  Administered 2019-06-16: 125 mg via INTRAMUSCULAR

## 2019-06-16 MED ORDER — KETOROLAC TROMETHAMINE 60 MG/2ML IM SOLN
60.0000 mg | Freq: Once | INTRAMUSCULAR | Status: AC
  Administered 2019-06-16: 60 mg via INTRAMUSCULAR

## 2019-06-16 MED ORDER — ACETAMINOPHEN 500 MG PO TABS: 500.0000 mg | ORAL_TABLET | Freq: Four times a day (QID) | ORAL | 0 refills | Status: DC | PRN

## 2019-06-16 NOTE — ED Triage Notes (Signed)
Pain to lower back after picking up a bag of cat litter yesterday.  States she felt like electricity went down her back after she picked the bag.  She has tried naproxen and meloxicam with no relief from her pain.

## 2019-06-16 NOTE — ED Provider Notes (Signed)
[facility]    CSN: [id number] Arrival date & time: 06/16/19  1841      History   Chief Complaint Chief Complaint  Patient presents with  . Back Pain    HPI Kathleen Velasquez is a 29 y.o. female.   Presented to the urgent care for complaint of lower back pain that started yesterday.  Report she did get a bag of cat litter and around the symptom thereafter.  She localizes the pain to the bilateral low back.  She describes the pain as constant and achy.  She has tried OTC medications without relief.  Her symptom are made worse with ROM.  She denies similar symptoms in the past.  Denies chills, fever, nausea, vomiting, diarrhea, paresthesia, confusion, diplopia, blurry vision.   The history is provided by the patient. No language interpreter was used.  Back Pain   Past Medical History:  Diagnosis Date  . Abscess    right buttocks/thigh  . Anxiety   . Depression   . DUB (dysfunctional uterine bleeding) 05/25/2012  . Hydradenitis   . Hypothyroidism   . Polycystic ovaries   . Thyroid disease    hypothyroidism    Patient Active Problem List   Diagnosis Date Noted  . Left axillary hidradenitis   . Anovulatory (dysfunctional uterine) bleeding 06/07/2012  . DUB (dysfunctional uterine bleeding) 05/25/2012  . FOOT PAIN, CHRONIC 05/09/2007    Past Surgical History:  Procedure Laterality Date  . ADENOIDECTOMY    . CHOLECYSTECTOMY  01/27/2012   Procedure: LAPAROSCOPIC CHOLECYSTECTOMY;  Surgeon: Fabio Bering, MD;  Location: [facility];  Service: General;  Laterality: N/A;  . HERNIA REPAIR Left    LIH  . HYDRADENITIS EXCISION  10/02/2011   Procedure: EXCISION HYDRADENITIS AXILLA;  Surgeon: Fabio Bering, MD;  Location: [facility];  Service: General;  Laterality: Right;  Right Axillary Dissection  . HYDRADENITIS EXCISION Left 10/19/2013   Procedure: INCISION OF HIDRADENITIS SUPPURATIVA LEFT AXILLA;  Surgeon: Marlane Hatcher, MD;  Location: [facility];  Service: General;   Laterality: Left;  . HYDRADENITIS EXCISION Left 09/09/2018   Procedure: EXCISION HIDRADENITIS AXILLA, LEFT;  Surgeon: Franky Macho, MD;  Location: [facility];  Service: General;  Laterality: Left;  . TONSILLECTOMY      OB History    Gravida  0   Para      Term      Preterm      AB      Living  0     SAB      TAB      Ectopic      Multiple      Live Births               Home Medications    Prior to Admission medications   Medication Sig Start Date End Date Taking? Authorizing Provider  amphetamine-dextroamphetamine (ADDERALL) 20 MG tablet Take 1 tablet (20 mg total) by mouth 2 (two) times daily. 05/29/19 05/28/20 Yes Myrlene Broker, MD  clonazePAM (KLONOPIN) 1 MG tablet TAKE 1 TABLET(1 MG) BY MOUTH TWICE DAILY AS NEEDED FOR ANXIETY 05/29/19  Yes Myrlene Broker, MD  escitalopram (LEXAPRO) 20 MG tablet Take 1 tablet (20 mg total) by mouth daily. 05/29/19 05/28/20 Yes Myrlene Broker, MD  acetaminophen (TYLENOL) 500 MG tablet Take 1 tablet (500 mg total) by mouth every 6 (six) hours as needed. 06/16/19   Enid Maultsby, Zachery Dakins, FNP  amphetamine-dextroamphetamine (ADDERALL) 20 MG tablet Take 1 tablet (20  mg total) by mouth 2 (two) times daily. 05/29/19 05/28/20  Myrlene Broker, MD  amphetamine-dextroamphetamine (ADDERALL) 20 MG tablet Take 1 tablet (20 mg total) by mouth 2 (two) times daily. 05/29/19 05/28/20  Myrlene Broker, MD  clindamycin (CLEOCIN) 300 MG capsule Take 300 mg by mouth 2 (two) times daily.  08/08/18   [provider]  cyclobenzaprine (FLEXERIL) 10 MG tablet Take 1 tablet (10 mg total) by mouth 3 (three) times daily as needed for muscle spasms. 06/16/19   Nino Amano, Zachery Dakins, FNP  HYDROcodone-acetaminophen (NORCO/VICODIN) 5-325 MG tablet Take 1 tablet by mouth every 4 (four) hours as needed for moderate pain. 09/12/18 09/12/19  Franky Macho, MD  HYDROmorphone (DILAUDID) 2 MG tablet Dilaudid 2 mg tablet  1 q 4-6 hrs prn pain    [provider]  naproxen  (NAPROSYN) 500 MG tablet TK 1 T PO BID 09/30/18   [provider]  NUVARING 0.12-0.015 MG/24HR vaginal ring INSERT VAGINALLY AND LEAVE IN. PLACE FOR 3 CONSECUTIVE WEEKS, THEN REMOVE FOR 1 WEEK Patient taking differently: Place 1 each vaginally every 28 (twenty-eight) days.  11/28/15   Constant, Peggy, MD  ondansetron (ZOFRAN) 4 MG tablet Zofran 4 mg tablet  1 q 6-8 hrs as needed for post op nausea    [provider]  predniSONE (DELTASONE) 10 MG tablet Take 2 tablets (20 mg total) by mouth daily. 06/16/19   Manuel Lawhead, Zachery Dakins, FNP  traMADol (ULTRAM) 50 MG tablet Take 1 tablet (50 mg total) by mouth every 6 (six) hours as needed. 09/09/18   Franky Macho, MD    Family History Family History  Problem Relation Age of Onset  . Diabetes Mother   . Hypertension Mother   . Hyperlipidemia Mother   . Neuropathy Mother   . Diabetes Maternal Grandmother   . Hypertension Maternal Grandmother   . Heart disease Maternal Grandmother        CHF  . COPD Maternal Grandmother   . Cirrhosis Maternal Grandmother   . Anxiety disorder Maternal Grandmother   . Cancer Maternal Grandfather        lung  . Diabetes Paternal Grandmother   . Diabetes Paternal Grandfather   . Aneurysm Paternal Grandfather   . Anxiety disorder Paternal Grandfather   . Drug abuse Father   . ADD / ADHD Brother   . Stroke Other   . Anxiety disorder Maternal Uncle   . Anxiety disorder Paternal Uncle   . Depression Paternal Uncle   . Anxiety disorder Maternal Aunt     Social History Social History   Tobacco Use  . Smoking status: Current Every Day Smoker    Packs/day: 0.25    Years: 7.00    Pack years: 1.75    Types: Cigarettes  . Smokeless tobacco: Never Used  Substance Use Topics  . Alcohol use: Yes    Comment: social use . 01-19-14 per pt no  . Drug use: No     Allergies   Bactrim, Ciprofloxacin, Other, Penicillins, Percocet [oxycodone-acetaminophen], and Vancomycin   Review of Systems Review of  Systems  Constitutional: Negative.   Respiratory: Negative.   Cardiovascular: Negative.   Musculoskeletal: Positive for back pain.  All other systems reviewed and are negative.    Physical Exam Triage Vital Signs ED Triage Vitals  Enc Vitals Group     BP      Pulse      Resp      Temp      Temp src  SpO2      Weight      Height      Head Circumference      Peak Flow      Pain Score      Pain Loc      Pain Edu?      Excl. in GC?    No data found.  Updated Vital Signs BP 120/84 (BP Location: Right Arm)   Pulse 86   Temp 98.4 F (36.9 C) (Oral)   Resp 18   Ht 5\' 4"  (1.626 m)   Wt 252 lb (114.3 kg)   SpO2 97%   BMI 43.26 kg/m   Visual Acuity Right Eye Distance:   Left Eye Distance:   Bilateral Distance:    Right Eye Near:   Left Eye Near:    Bilateral Near:     Physical Exam Vitals and nursing note reviewed.  Constitutional:      General: She is not in acute distress.    Appearance: Normal appearance. She is obese. She is not ill-appearing, toxic-appearing or diaphoretic.  Cardiovascular:     Rate and Rhythm: Normal rate and regular rhythm.     Pulses: Normal pulses.     Heart sounds: Normal heart sounds. No murmur. No gallop.   Pulmonary:     Effort: Pulmonary effort is normal. No respiratory distress.     Breath sounds: Normal breath sounds. No stridor. No wheezing, rhonchi or rales.  Chest:     Chest wall: No tenderness.  Musculoskeletal:        General: Tenderness present.     Cervical back: Normal.     Thoracic back: Normal.     Lumbar back: Spasms and tenderness present.     Comments: Back:  Patient ambulates from chair to exam table without difficulty.  Inspection: Skin clear and intact without obvious swelling, erythema, or ecchymosis. Warm to the touch  Palpation: Vertebral processes nontender. Tenderness about the lateral lower back Special Tests: Negative Straight leg raise  Neurological:     General: No focal deficit present.      Mental Status: She is alert and oriented to person, place, and time.     Cranial Nerves: No cranial nerve deficit.     Sensory: No sensory deficit.     Motor: No weakness.     Coordination: Coordination normal.     Gait: Gait normal.     Deep Tendon Reflexes: Reflexes normal.      UC Treatments / Results  Labs (all labs ordered are listed, but only abnormal results are displayed) Labs Reviewed - No data to display  EKG   Radiology No results found.  Procedures Procedures (including critical care time)  Medications Ordered in UC Medications  methylPREDNISolone sodium succinate (SOLU-MEDROL) 125 mg/2 mL injection 125 mg (has no administration in time range)  ketorolac (TORADOL) injection 60 mg (has no administration in time range)    Initial Impression / Assessment and Plan / UC Course  I have reviewed the triage vital signs and the nursing notes.  Pertinent labs & imaging results that were available during my care of the patient were reviewed by me and considered in my medical decision making (see chart for details).   Patient is stable at discharge.  Symptoms likely from his muscular in nature.  Flexeril, prednisone, and Tylenol were prescribed.  Was advised to follow-up with PCP.  Final Clinical Impressions(s) / UC Diagnoses   Final diagnoses:  Acute bilateral low back pain without  sciatica     Discharge Instructions     Rest, ice and heat as needed Ensure adequate ROM as tolerated. Prescribed Tylenol for pain relief Prescribed prednisone for inflammation Prescribed flexeril  for muscle spasm.  Do not drive or operate heavy machinery while taking this medication Return here or go to ER if you have any new or worsening symptoms such as numbness/tingling of the inner thighs, loss of bladder or bowel control, headache/blurry vision, nausea/vomiting, confusion/altered mental status, dizziness, weakness, passing out, imbalance, etc...       ED Prescriptions     Medication Sig Dispense Auth. Provider   predniSONE (DELTASONE) 10 MG tablet Take 2 tablets (20 mg total) by mouth daily. 15 tablet Dorreen Valiente, Darrelyn Hillock, FNP   cyclobenzaprine (FLEXERIL) 10 MG tablet Take 1 tablet (10 mg total) by mouth 3 (three) times daily as needed for muscle spasms. 30 tablet Gilman Olazabal, Darrelyn Hillock, FNP   acetaminophen (TYLENOL) 500 MG tablet Take 1 tablet (500 mg total) by mouth every 6 (six) hours as needed. 30 tablet Maddon Horton, Darrelyn Hillock, FNP     I have reviewed the PDMP during this encounter.   Emerson Monte, FNP 06/16/19 1903

## 2019-06-16 NOTE — Discharge Instructions (Addendum)
Rest, ice and heat as needed Ensure adequate ROM as tolerated. Prescribed Tylenol for  pain relief Prescribed prednisone for inflammation Prescribed flexeril  for muscle spasm.  Do not drive or operate heavy machinery while taking this medication Return here or go to ER if you have any new or worsening symptoms such as numbness/tingling of the inner thighs, loss of bladder or bowel control, headache/blurry vision, nausea/vomiting, confusion/altered mental status, dizziness, weakness, passing out, imbalance, etc...  

## 2019-08-29 ENCOUNTER — Other Ambulatory Visit: Payer: Self-pay

## 2019-08-29 ENCOUNTER — Telehealth (INDEPENDENT_AMBULATORY_CARE_PROVIDER_SITE_OTHER): Payer: 59 | Admitting: Psychiatry

## 2019-08-29 ENCOUNTER — Encounter (HOSPITAL_COMMUNITY): Payer: Self-pay | Admitting: Psychiatry

## 2019-08-29 DIAGNOSIS — F900 Attention-deficit hyperactivity disorder, predominantly inattentive type: Secondary | ICD-10-CM | POA: Diagnosis not present

## 2019-08-29 DIAGNOSIS — F411 Generalized anxiety disorder: Secondary | ICD-10-CM

## 2019-08-29 MED ORDER — AMPHETAMINE-DEXTROAMPHETAMINE 20 MG PO TABS: 20.0000 mg | ORAL_TABLET | Freq: Two times a day (BID) | ORAL | 0 refills | Status: DC

## 2019-08-29 MED ORDER — CLONAZEPAM 1 MG PO TABS: ORAL_TABLET | ORAL | 2 refills | Status: DC

## 2019-08-29 MED ORDER — ESCITALOPRAM OXALATE 20 MG PO TABS: 20.0000 mg | ORAL_TABLET | Freq: Every day | ORAL | 2 refills | Status: DC

## 2019-08-29 NOTE — Progress Notes (Signed)
Virtual Visit via Video Note  I connected with Kathleen Velasquez on 08/29/19 at  1:00 PM EDT by a video enabled telemedicine application and verified that I am speaking with the correct person using two identifiers.   I discussed the limitations of evaluation and management by telemedicine and the availability of in person appointments. The patient expressed understanding and agreed to proceed.   I discussed the assessment and treatment plan with the patient. The patient was provided an opportunity to ask questions and all were answered. The patient agreed with the plan and demonstrated an understanding of the instructions.   The patient was advised to call back or seek an in-person evaluation if the symptoms worsen or if the condition fails to improve as anticipated.  I provided 15 minutes of non-face-to-face time during this encounter. Location: Provider Home patient home  [name], MD  [facility] MD/PA/NP OP Progress Note  08/29/2019 1:17 PM Kathleen Velasquez  MRN:  [id number]  Chief Complaint:  Chief Complaint    Depression; Anxiety; ADD; Follow-up     HPI: This patient is a 29 year old single white female who lives alone.  She often has her young niece staying with her.  She is working as a Occupational hygienist at [employer].  The patient returns for follow-up after 3 months.  She states that she recently got promoted to being a Occupational hygienist at [employer] and she is enjoying it so far.  She does have early hours at times but is still able to sleep well.  She states that her mood is good her anxiety is under good control and she does not use Klonopin up to 3 times daily very often.  Usually is once or twice a day.  She is staying very well focused with the Adderall.  She denies any thoughts of self-harm. Visit Diagnosis:    ICD-10-CM   1. Generalized anxiety disorder  F41.1   2. Attention deficit hyperactivity disorder (ADHD), predominantly inattentive type  F90.0      Past Psychiatric History: none  Past Medical History:  Past Medical History:  Diagnosis Date  . Abscess    right buttocks/thigh  . Anxiety   . Depression   . DUB (dysfunctional uterine bleeding) 05/25/2012  . Hydradenitis   . Hypothyroidism   . Polycystic ovaries   . Thyroid disease    hypothyroidism    Past Surgical History:  Procedure Laterality Date  . ADENOIDECTOMY    . CHOLECYSTECTOMY  01/27/2012   Procedure: LAPAROSCOPIC CHOLECYSTECTOMY;  Surgeon: Fabio Bering, MD;  Location: [facility];  Service: General;  Laterality: N/A;  . HERNIA REPAIR Left    LIH  . HYDRADENITIS EXCISION  10/02/2011   Procedure: EXCISION HYDRADENITIS AXILLA;  Surgeon: Fabio Bering, MD;  Location: [facility];  Service: General;  Laterality: Right;  Right Axillary Dissection  . HYDRADENITIS EXCISION Left 10/19/2013   Procedure: INCISION OF HIDRADENITIS SUPPURATIVA LEFT AXILLA;  Surgeon: Marlane Hatcher, MD;  Location: [facility];  Service: General;  Laterality: Left;  . HYDRADENITIS EXCISION Left 09/09/2018   Procedure: EXCISION HIDRADENITIS AXILLA, LEFT;  Surgeon: Franky Macho, MD;  Location: [facility];  Service: General;  Laterality: Left;  . TONSILLECTOMY      Family Psychiatric History: see below  Family History:  Family History  Problem Relation Age of Onset  . Diabetes Mother   . Hypertension Mother   . Hyperlipidemia Mother   . Neuropathy Mother   . Diabetes Maternal Grandmother   .  Hypertension Maternal Grandmother   . Heart disease Maternal Grandmother        CHF  . COPD Maternal Grandmother   . Cirrhosis Maternal Grandmother   . Anxiety disorder Maternal Grandmother   . Cancer Maternal Grandfather        lung  . Diabetes Paternal Grandmother   . Diabetes Paternal Grandfather   . Aneurysm Paternal Grandfather   . Anxiety disorder Paternal Grandfather   . Drug abuse Father   . ADD / ADHD Brother   . Stroke Other   . Anxiety disorder Maternal Uncle   . Anxiety disorder Paternal  Uncle   . Depression Paternal Uncle   . Anxiety disorder Maternal Aunt     Social History:  Social History   Socioeconomic History  . Marital status: Single    Spouse name: Not on file  . Number of children: Not on file  . Years of education: Not on file  . Highest education level: Not on file  Occupational History  . Not on file  Tobacco Use  . Smoking status: Current Every Day Smoker    Packs/day: 0.25    Years: 7.00    Pack years: 1.75    Types: Cigarettes  . Smokeless tobacco: Never Used  Vaping Use  . Vaping Use: Never used  Substance and Sexual Activity  . Alcohol use: Yes    Comment: social use . 01-19-14 per pt no  . Drug use: No  . Sexual activity: Yes    Birth control/protection: None, Implant  Other Topics Concern  . Not on file  Social History Narrative  . Not on file   Social Determinants of Health   Financial Resource Strain:   . Difficulty of Paying Living Expenses:   Food Insecurity:   . Worried About Programme researcher, broadcasting/film/video in the Last Year:   . Barista in the Last Year:   Transportation Needs:   . Freight forwarder (Medical):   Marland Kitchen Lack of Transportation (Non-Medical):   Physical Activity:   . Days of Exercise per Week:   . Minutes of Exercise per Session:   Stress:   . Feeling of Stress :   Social Connections:   . Frequency of Communication with Friends and Family:   . Frequency of Social Gatherings with Friends and Family:   . Attends Religious Services:   . Active Member of Clubs or Organizations:   . Attends Banker Meetings:   Marland Kitchen Marital Status:     Allergies:  Allergies  Allergen Reactions  . Bactrim Hives and Itching  . Ciprofloxacin Hives and Itching    CRNA Discontinued preop antibiotic  IVPB  Cipro in OR after developed itching and hives left arm.  Received Benadryl with relief noted. Dr.Gonzalez and Dr. Leticia Penna informed. DDallasRN  . Other Other (See Comments)    [place] causes a hives, swelling, itching,  and anaphylaxis.   Marland Kitchen Penicillins Hives and Itching    Has patient had a PCN reaction causing immediate rash, facial/tongue/throat swelling, SOB or lightheadedness with hypotension: Yes Has patient had a PCN reaction causing severe rash involving mucus membranes or skin necrosis: No Has patient had a PCN reaction that required hospitalization No Has patient had a PCN reaction occurring within the last 10 years: No If all of the above answers are "NO", then may proceed with Cephalosporin use.   Marland Kitchen Percocet [Oxycodone-Acetaminophen] Other (See Comments)    Face flushes  . Vancomycin Itching  Metabolic Disorder Labs: Lab Results  Component Value Date   HGBA1C 5.7 (H) 05/25/2012   MPG 117 (H) 05/25/2012   No results found for: PROLACTIN No results found for: CHOL, TRIG, HDL, CHOLHDL, VLDL, LDLCALC Lab Results  Component Value Date   TSH 2.515 05/25/2012    Therapeutic Level Labs: No results found for: LITHIUM No results found for: VALPROATE No components found for:  CBMZ  Current Medications: Current Outpatient Medications  Medication Sig Dispense Refill  . acetaminophen (TYLENOL) 500 MG tablet Take 1 tablet (500 mg total) by mouth every 6 (six) hours as needed. 30 tablet 0  . amphetamine-dextroamphetamine (ADDERALL) 20 MG tablet Take 1 tablet (20 mg total) by mouth 2 (two) times daily. 60 tablet 0  . amphetamine-dextroamphetamine (ADDERALL) 20 MG tablet Take 1 tablet (20 mg total) by mouth 2 (two) times daily. 60 tablet 0  . amphetamine-dextroamphetamine (ADDERALL) 20 MG tablet Take 1 tablet (20 mg total) by mouth 2 (two) times daily. 60 tablet 0  . clindamycin (CLEOCIN) 300 MG capsule Take 300 mg by mouth 2 (two) times daily.     . clonazePAM (KLONOPIN) 1 MG tablet TAKE 1 TABLET(1 MG) BY MOUTH TWICE DAILY AS NEEDED FOR ANXIETY 60 tablet 2  . cyclobenzaprine (FLEXERIL) 10 MG tablet Take 1 tablet (10 mg total) by mouth 3 (three) times daily as needed for muscle spasms. 30 tablet 0   . escitalopram (LEXAPRO) 20 MG tablet Take 1 tablet (20 mg total) by mouth daily. 90 tablet 2  . HYDROcodone-acetaminophen (NORCO/VICODIN) 5-325 MG tablet Take 1 tablet by mouth every 4 (four) hours as needed for moderate pain. 40 tablet 0  . HYDROmorphone (DILAUDID) 2 MG tablet Dilaudid 2 mg tablet  1 q 4-6 hrs prn pain    . naproxen (NAPROSYN) 500 MG tablet TK 1 T PO BID    . NUVARING 0.12-0.015 MG/24HR vaginal ring INSERT VAGINALLY AND LEAVE IN. PLACE FOR 3 CONSECUTIVE WEEKS, THEN REMOVE FOR 1 WEEK (Patient taking differently: Place 1 each vaginally every 28 (twenty-eight) days. ) 1 each 10  . ondansetron (ZOFRAN) 4 MG tablet Zofran 4 mg tablet  1 q 6-8 hrs as needed for post op nausea    . predniSONE (DELTASONE) 10 MG tablet Take 2 tablets (20 mg total) by mouth daily. 15 tablet 0  . traMADol (ULTRAM) 50 MG tablet Take 1 tablet (50 mg total) by mouth every 6 (six) hours as needed. 25 tablet 0   No current facility-administered medications for this visit.     Musculoskeletal: Strength & Muscle Tone: within normal limits Gait & Station: normal Patient leans: N/A  Psychiatric Specialty Exam: Review of Systems  Musculoskeletal: Positive for joint swelling.  All other systems reviewed and are negative.   There were no vitals taken for this visit.There is no height or weight on file to calculate BMI.  General Appearance: Casual and Fairly Groomed  Eye Contact:  Good  Speech:  Clear and Coherent  Volume:  Normal  Mood:  Euthymic  Affect:  Appropriate and Congruent  Thought Process:  Goal Directed  Orientation:  Full (Time, Place, and Person)  Thought Content: WDL   Suicidal Thoughts:  No  Homicidal Thoughts:  No  Memory:  Immediate;   Good Recent;   Good Remote;   Good  Judgement:  Good  Insight:  Good  Psychomotor Activity:  Normal  Concentration:  Concentration: Good and Attention Span: Good  Recall:  Good  Fund of Knowledge: Good  Language:  Good  Akathisia:  No   Handed:  Right  AIMS (if indicated): not done  Assets:  Communication Skills Desire for Improvement Physical Health Resilience Social Support Talents/Skills  ADL's:  Intact  Cognition: WNL  Sleep:  Good   Screenings:   Assessment and Plan: Patient is a 29 year old female with a history depression anxiety and ADHD.  She continues to do well on her current regimen.  She will continue Adderall 20 mg twice daily for ADHD, Lexapro 20 mg daily for depression and anxiety and clonazepam 1 mg twice daily for anxiety.  She will return to see me in 3 months   Diannia Rudereborah Macon Sandiford, MD 08/29/2019, 1:17 PM

## 2019-08-31 ENCOUNTER — Ambulatory Visit
Admission: EM | Admit: 2019-08-31 | Discharge: 2019-08-31 | Disposition: A | Discharge status: Home / Self Care | Payer: 59 | Attending: Emergency Medicine | Admitting: Emergency Medicine

## 2019-08-31 ENCOUNTER — Encounter: Payer: Self-pay | Admitting: Emergency Medicine

## 2019-08-31 ENCOUNTER — Other Ambulatory Visit: Payer: Self-pay

## 2019-08-31 DIAGNOSIS — R221 Localized swelling, mass and lump, neck: Secondary | ICD-10-CM | POA: Diagnosis not present

## 2019-08-31 MED ORDER — PREDNISONE 10 MG PO TABS
20.0000 mg | ORAL_TABLET | Freq: Every day | ORAL | 0 refills | Status: DC
Start: 2019-08-31 — End: 2019-12-19

## 2019-08-31 MED ORDER — CETIRIZINE HCL 10 MG PO TABS: 10.0000 mg | ORAL_TABLET | Freq: Every day | ORAL | 0 refills | Status: DC

## 2019-08-31 NOTE — ED Triage Notes (Signed)
Pt feels like she has swelling in her throat for the past few days.  Is not coughing up anything. States today she felt like she was having a hard time swallowing food and liquids.

## 2019-08-31 NOTE — ED Provider Notes (Signed)
[facility]   [id number] 08/31/19 Arrival Time: 1753  BT:[facility] THROAT  SUBJECTIVE: History from: patient.  Kathleen Velasquez is a 29 y.o. female who presents to the urgent care with a feeling like throat swelling, and scratchy throat for the past few days.  Denies sick exposure to strep, flu or mono, or precipitating event.  Has tried OTC medication without relief.  Symptoms are made worse with swallowing, but tolerating liquids and own secretions without difficulty.  Denies previous symptoms in the past.  Denies fever, chills, fatigue, ear pain, sinus pain, rhinorrhea, nasal congestion, cough, SOB, wheezing, chest pain, nausea, rash, changes in bowel or bladder habits.     ROS: As per HPI.  All other pertinent ROS negative.     Past Medical History:  Diagnosis Date  . Abscess    right buttocks/thigh  . Anxiety   . Depression   . DUB (dysfunctional uterine bleeding) 05/25/2012  . Hydradenitis   . Hypothyroidism   . Polycystic ovaries   . Thyroid disease    hypothyroidism   Past Surgical History:  Procedure Laterality Date  . ADENOIDECTOMY    . CHOLECYSTECTOMY  01/27/2012   Procedure: LAPAROSCOPIC CHOLECYSTECTOMY;  Surgeon: Fabio Bering, MD;  Location: [facility];  Service: General;  Laterality: N/A;  . HERNIA REPAIR Left    LIH  . HYDRADENITIS EXCISION  10/02/2011   Procedure: EXCISION HYDRADENITIS AXILLA;  Surgeon: Fabio Bering, MD;  Location: [facility];  Service: General;  Laterality: Right;  Right Axillary Dissection  . HYDRADENITIS EXCISION Left 10/19/2013   Procedure: INCISION OF HIDRADENITIS SUPPURATIVA LEFT AXILLA;  Surgeon: Marlane Hatcher, MD;  Location: [facility];  Service: General;  Laterality: Left;  . HYDRADENITIS EXCISION Left 09/09/2018   Procedure: EXCISION HIDRADENITIS AXILLA, LEFT;  Surgeon: Franky Macho, MD;  Location: [facility];  Service: General;  Laterality: Left;  . TONSILLECTOMY     Allergies  Allergen Reactions  . Bactrim Hives and Itching  .  Ciprofloxacin Hives and Itching    CRNA Discontinued preop antibiotic  IVPB  Cipro in OR after developed itching and hives left arm.  Received Benadryl with relief noted. Dr.Gonzalez and Dr. Leticia Penna informed. DDallasRN  . Other Other (See Comments)    [place] causes a hives, swelling, itching, and anaphylaxis.   Marland Kitchen Penicillins Hives and Itching    Has patient had a PCN reaction causing immediate rash, facial/tongue/throat swelling, SOB or lightheadedness with hypotension: Yes Has patient had a PCN reaction causing severe rash involving mucus membranes or skin necrosis: No Has patient had a PCN reaction that required hospitalization No Has patient had a PCN reaction occurring within the last 10 years: No If all of the above answers are "NO", then may proceed with Cephalosporin use.   Marland Kitchen Percocet [Oxycodone-Acetaminophen] Other (See Comments)    Face flushes  . Vancomycin Itching   No current facility-administered medications on file prior to encounter.   Current Outpatient Medications on File Prior to Encounter  Medication Sig Dispense Refill  . acetaminophen (TYLENOL) 500 MG tablet Take 1 tablet (500 mg total) by mouth every 6 (six) hours as needed. 30 tablet 0  . amphetamine-dextroamphetamine (ADDERALL) 20 MG tablet Take 1 tablet (20 mg total) by mouth 2 (two) times daily. 60 tablet 0  . amphetamine-dextroamphetamine (ADDERALL) 20 MG tablet Take 1 tablet (20 mg total) by mouth 2 (two) times daily. 60 tablet 0  . amphetamine-dextroamphetamine (ADDERALL) 20 MG tablet Take 1 tablet (20 mg total) by mouth  2 (two) times daily. 60 tablet 0  . clindamycin (CLEOCIN) 300 MG capsule Take 300 mg by mouth 2 (two) times daily.     . clonazePAM (KLONOPIN) 1 MG tablet TAKE 1 TABLET(1 MG) BY MOUTH TWICE DAILY AS NEEDED FOR ANXIETY 60 tablet 2  . cyclobenzaprine (FLEXERIL) 10 MG tablet Take 1 tablet (10 mg total) by mouth 3 (three) times daily as needed for muscle spasms. 30 tablet 0  . escitalopram (LEXAPRO)  20 MG tablet Take 1 tablet (20 mg total) by mouth daily. 90 tablet 2  . HYDROcodone-acetaminophen (NORCO/VICODIN) 5-325 MG tablet Take 1 tablet by mouth every 4 (four) hours as needed for moderate pain. 40 tablet 0  . HYDROmorphone (DILAUDID) 2 MG tablet Dilaudid 2 mg tablet  1 q 4-6 hrs prn pain    . naproxen (NAPROSYN) 500 MG tablet TK 1 T PO BID    . NUVARING 0.12-0.015 MG/24HR vaginal ring INSERT VAGINALLY AND LEAVE IN. PLACE FOR 3 CONSECUTIVE WEEKS, THEN REMOVE FOR 1 WEEK (Patient taking differently: Place 1 each vaginally every 28 (twenty-eight) days. ) 1 each 10  . ondansetron (ZOFRAN) 4 MG tablet Zofran 4 mg tablet  1 q 6-8 hrs as needed for post op nausea    . traMADol (ULTRAM) 50 MG tablet Take 1 tablet (50 mg total) by mouth every 6 (six) hours as needed. 25 tablet 0   Social History   Socioeconomic History  . Marital status: Single    Spouse name: Not on file  . Number of children: Not on file  . Years of education: Not on file  . Highest education level: Not on file  Occupational History  . Not on file  Tobacco Use  . Smoking status: Current Every Day Smoker    Packs/day: 0.25    Years: 7.00    Pack years: 1.75    Types: Cigarettes  . Smokeless tobacco: Never Used  Vaping Use  . Vaping Use: Never used  Substance and Sexual Activity  . Alcohol use: Yes    Comment: social use . 01-19-14 per pt no  . Drug use: No  . Sexual activity: Yes    Birth control/protection: None, Implant  Other Topics Concern  . Not on file  Social History Narrative  . Not on file   Social Determinants of Health   Financial Resource Strain:   . Difficulty of Paying Living Expenses: Not on file  Food Insecurity:   . Worried About Programme researcher, broadcasting/film/video in the Last Year: Not on file  . Ran Out of Food in the Last Year: Not on file  Transportation Needs:   . Lack of Transportation (Medical): Not on file  . Lack of Transportation (Non-Medical): Not on file  Physical Activity:   . Days of  Exercise per Week: Not on file  . Minutes of Exercise per Session: Not on file  Stress:   . Feeling of Stress : Not on file  Social Connections:   . Frequency of Communication with Friends and Family: Not on file  . Frequency of Social Gatherings with Friends and Family: Not on file  . Attends Religious Services: Not on file  . Active Member of Clubs or Organizations: Not on file  . Attends Banker Meetings: Not on file  . Marital Status: Not on file  Intimate Partner Violence:   . Fear of Current or Ex-Partner: Not on file  . Emotionally Abused: Not on file  . Physically Abused: Not on  file  . Sexually Abused: Not on file   Family History  Problem Relation Age of Onset  . Diabetes Mother   . Hypertension Mother   . Hyperlipidemia Mother   . Neuropathy Mother   . Diabetes Maternal Grandmother   . Hypertension Maternal Grandmother   . Heart disease Maternal Grandmother        CHF  . COPD Maternal Grandmother   . Cirrhosis Maternal Grandmother   . Anxiety disorder Maternal Grandmother   . Cancer Maternal Grandfather        lung  . Diabetes Paternal Grandmother   . Diabetes Paternal Grandfather   . Aneurysm Paternal Grandfather   . Anxiety disorder Paternal Grandfather   . Drug abuse Father   . ADD / ADHD Brother   . Stroke Other   . Anxiety disorder Maternal Uncle   . Anxiety disorder Paternal Uncle   . Depression Paternal Uncle   . Anxiety disorder Maternal Aunt     OBJECTIVE:  Vitals:   08/31/19 1901 08/31/19 1902  BP: (!) 147/77   Pulse: 80   Resp: 19   Temp: 98.4 F (36.9 C)   TempSrc: Oral   SpO2: 98%   Weight:  248 lb (112.5 kg)  Height:  5\' 4"  (1.626 m)     General appearance: alert; appears fatigued, but nontoxic, speaking in full sentences and managing own secretions HEENT: NCAT; Ears: EACs clear, TMs pearly gray with visible cone of light, without erythema; Eyes: PERRL, EOMI grossly; Nose: no obvious rhinorrhea; Throat: oropharynx  clear, tonsils 1+ and mildly erythematous without white tonsillar exudates, uvula midline Neck: supple without LAD Lungs: CTA bilaterally without adventitious breath sounds; cough absent Heart: regular rate and rhythm.  Radial pulses 2+ symmetrical bilaterally Skin: warm and dry Psychological: alert and cooperative; normal mood and affect  LABS: No results found for this or any previous visit (from the past 24 hour(s)).   ASSESSMENT & PLAN:  1. Throat swelling     Meds ordered this encounter  Medications  . predniSONE (DELTASONE) 10 MG tablet    Sig: Take 2 tablets (20 mg total) by mouth daily.    Dispense:  15 tablet    Refill:  0  . cetirizine (ZYRTEC ALLERGY) 10 MG tablet    Sig: Take 1 tablet (10 mg total) by mouth daily.    Dispense:  30 tablet    Refill:  0   Discharge Instructions Low-dose prednisone prescribed take as directed Zyrtec prescribed Get plenty of rest and push fluids Drink warm or cool liquids, use throat lozenges, or popsicles to help alleviate symptoms Take OTC ibuprofen or tylenol as needed for pain Follow up with PCP if symptoms persists Return or go to ER if patient has any new or worsening symptoms such as fever, chills, nausea, vomiting, worsening sore throat, cough, abdominal pain, chest pain, changes in bowel or bladder habits, etc...  Reviewed expectations re: course of current medical issues. Questions answered. Outlined signs and symptoms indicating need for more acute intervention. Patient verbalized understanding. After Visit Summary given.     Note: This document was prepared using Dragon voice recognition software and may include unintentional dictation errors.    , FNP 08/31/19 1932

## 2019-08-31 NOTE — Discharge Instructions (Addendum)
Low-dose prednisone prescribed take as directed Zyrtec prescribed Get plenty of rest and push fluids Drink warm or cool liquids, use throat lozenges, or popsicles to help alleviate symptoms Take OTC ibuprofen or tylenol as needed for pain Follow up with PCP if symptoms persists Return or go to ER if patient has any new or worsening symptoms such as fever, chills, nausea, vomiting, worsening sore throat, cough, abdominal pain, chest pain, changes in bowel or bladder habits, etc..Marland Kitchen

## 2019-09-06 ENCOUNTER — Other Ambulatory Visit (HOSPITAL_COMMUNITY): Payer: Self-pay | Admitting: Internal Medicine

## 2019-09-06 ENCOUNTER — Other Ambulatory Visit: Payer: Self-pay | Admitting: Internal Medicine

## 2019-09-06 DIAGNOSIS — R0989 Other specified symptoms and signs involving the circulatory and respiratory systems: Secondary | ICD-10-CM

## 2019-09-06 DIAGNOSIS — R1310 Dysphagia, unspecified: Secondary | ICD-10-CM

## 2019-09-15 ENCOUNTER — Other Ambulatory Visit: Payer: Self-pay

## 2019-09-15 ENCOUNTER — Ambulatory Visit (HOSPITAL_COMMUNITY)
Admission: RE | Admit: 2019-09-15 | Discharge: 2019-09-15 | Disposition: A | Discharge status: Home / Self Care | Payer: 59 | Source: Ambulatory Visit | Attending: Internal Medicine | Admitting: Internal Medicine

## 2019-09-15 DIAGNOSIS — R1310 Dysphagia, unspecified: Secondary | ICD-10-CM

## 2019-09-15 DIAGNOSIS — R0989 Other specified symptoms and signs involving the circulatory and respiratory systems: Secondary | ICD-10-CM

## 2019-09-27 ENCOUNTER — Encounter: Payer: Self-pay | Admitting: Internal Medicine

## 2019-11-02 ENCOUNTER — Other Ambulatory Visit: Payer: Self-pay

## 2019-11-02 ENCOUNTER — Ambulatory Visit (INDEPENDENT_AMBULATORY_CARE_PROVIDER_SITE_OTHER): Payer: 59 | Admitting: Internal Medicine

## 2019-11-02 ENCOUNTER — Telehealth: Payer: Self-pay | Admitting: *Deleted

## 2019-11-02 ENCOUNTER — Encounter: Payer: Self-pay | Admitting: Internal Medicine

## 2019-11-02 VITALS — BP 141/84 | HR 71 | Temp 97.1°F | Ht 63.0 in | Wt 248.6 lb

## 2019-11-02 DIAGNOSIS — R1319 Other dysphagia: Secondary | ICD-10-CM | POA: Diagnosis not present

## 2019-11-02 DIAGNOSIS — R197 Diarrhea, unspecified: Secondary | ICD-10-CM | POA: Diagnosis not present

## 2019-11-02 NOTE — Telephone Encounter (Signed)
Called pt. She has been scheduled for EGD/DIL with Dr. Marletta Lor, ASA 3 on 11/23 at 3:00pm. Aware will mail instructions with pre-op/covid test.

## 2019-11-02 NOTE — Patient Instructions (Signed)
We will schedule you for EGD with possible dilation to further evaluate your dysphagia.  Further recommendations to follow.  For your chronic loose stools, we will continue to monitor for now.  We may need to perform colonoscopy in the future to rule out underlying inflammatory bowel disease such as ulcerative colitis or Crohn's disease as well as microscopic colitis.  At [facility] we value your feedback. You may receive a survey about your visit today. Please share your experience as we strive to create trusting relationships with our patients to provide genuine, compassionate, quality care.  We appreciate your understanding and patience as we review any laboratory studies, imaging, and other diagnostic tests that are ordered as we care for you. Our office policy is 5 business days for review of these results, and any emergent or urgent results are addressed in a timely manner for your best interest. If you do not hear from our office in 1 week, please contact us.   We also encourage the use of MyChart, which contains your medical information for your review as well. If you are not enrolled in this feature, an access code is on this after visit summary for your convenience. Thank you for allowing [place] to be involved in your care.  It was great to see you today!  I hope you have a great rest of your fall!!    Easter Schinke K. Marletta Lor, D.O. Gastroenterology and Hepatology [facility]

## 2019-11-02 NOTE — Progress Notes (Signed)
Primary Care Physician:  Elfredia Nevins, MD Primary Gastroenterologist:  Dr. Marletta Lor  Chief Complaint  Patient presents with  . Dysphagia    throat clearing frequently, liquids/foods feels like it gets stuck at times    HPI:   Kathleen Velasquez is a 29 y.o. female who presents to the clinic today by referral from her PCP Dr. Carlena Sax for evaluation.  Patient states over the last 2 to 3 months she has had progressively worsening dysphagia primarily with solids but also liquids as well.  Feels as though these will "get stuck" in her substernal region at times.  Had a barium swallow which showed findings concerning for mild presbyesophagus.  Does take OTC NSAIDs regularly though not every day.  Has recently required steroids as well for back injury.  No history of PUD or H. pylori that she knows of.  No previous upper endoscopy.  Patient also has chronic loose stools.  She states this is been going on for approximately 8 years since she had her gallbladder removed.  No melena or hematochezia.  No unintentional weight loss.  No abdominal pain.  No family history of colorectal malignancy or inflammatory bowel disease.  Past Medical History:  Diagnosis Date  . Abscess    right buttocks/thigh  . Anxiety   . Depression   . DUB (dysfunctional uterine bleeding) 05/25/2012  . Hydradenitis   . Hypothyroidism   . Polycystic ovaries   . Thyroid disease    hypothyroidism    Past Surgical History:  Procedure Laterality Date  . ADENOIDECTOMY    . CHOLECYSTECTOMY  01/27/2012   Procedure: LAPAROSCOPIC CHOLECYSTECTOMY;  Surgeon: Fabio Bering, MD;  Location: [facility];  Service: General;  Laterality: N/A;  . HERNIA REPAIR Left    LIH  . HYDRADENITIS EXCISION  10/02/2011   Procedure: EXCISION HYDRADENITIS AXILLA;  Surgeon: Fabio Bering, MD;  Location: [facility];  Service: General;  Laterality: Right;  Right Axillary Dissection  . HYDRADENITIS EXCISION Left 10/19/2013   Procedure: INCISION OF HIDRADENITIS  SUPPURATIVA LEFT AXILLA;  Surgeon: Marlane Hatcher, MD;  Location: [facility];  Service: General;  Laterality: Left;  . HYDRADENITIS EXCISION Left 09/09/2018   Procedure: EXCISION HIDRADENITIS AXILLA, LEFT;  Surgeon: Franky Macho, MD;  Location: [facility];  Service: General;  Laterality: Left;  . TONSILLECTOMY      Current Outpatient Medications  Medication Sig Dispense Refill  . acetaminophen (TYLENOL) 500 MG tablet Take 1 tablet (500 mg total) by mouth every 6 (six) hours as needed. 30 tablet 0  . amphetamine-dextroamphetamine (ADDERALL) 20 MG tablet Take 1 tablet (20 mg total) by mouth 2 (two) times daily. 60 tablet 0  . amphetamine-dextroamphetamine (ADDERALL) 20 MG tablet Take 1 tablet (20 mg total) by mouth 2 (two) times daily. 60 tablet 0  . amphetamine-dextroamphetamine (ADDERALL) 20 MG tablet Take 1 tablet (20 mg total) by mouth 2 (two) times daily. 60 tablet 0  . cetirizine (ZYRTEC ALLERGY) 10 MG tablet Take 1 tablet (10 mg total) by mouth daily. 30 tablet 0  . clonazePAM (KLONOPIN) 1 MG tablet TAKE 1 TABLET(1 MG) BY MOUTH TWICE DAILY AS NEEDED FOR ANXIETY 60 tablet 2  . cyclobenzaprine (FLEXERIL) 10 MG tablet Take 1 tablet (10 mg total) by mouth 3 (three) times daily as needed for muscle spasms. 30 tablet 0  . escitalopram (LEXAPRO) 20 MG tablet Take 1 tablet (20 mg total) by mouth daily. 90 tablet 2  . NUVARING 0.12-0.015 MG/24HR vaginal ring INSERT VAGINALLY  AND LEAVE IN. PLACE FOR 3 CONSECUTIVE WEEKS, THEN REMOVE FOR 1 WEEK (Patient taking differently: Place 1 each vaginally every 28 (twenty-eight) days. ) 1 each 10  . clindamycin (CLEOCIN) 300 MG capsule Take 300 mg by mouth 2 (two) times daily.  (Patient not taking: Reported on 11/02/2019)    . HYDROmorphone (DILAUDID) 2 MG tablet Dilaudid 2 mg tablet  1 q 4-6 hrs prn pain (Patient not taking: Reported on 11/02/2019)    . naproxen (NAPROSYN) 500 MG tablet TK 1 T PO BID (Patient not taking: Reported on 11/02/2019)    . ondansetron  (ZOFRAN) 4 MG tablet Zofran 4 mg tablet  1 q 6-8 hrs as needed for post op nausea (Patient not taking: Reported on 11/02/2019)    . predniSONE (DELTASONE) 10 MG tablet Take 2 tablets (20 mg total) by mouth daily. (Patient not taking: Reported on 11/02/2019) 15 tablet 0  . traMADol (ULTRAM) 50 MG tablet Take 1 tablet (50 mg total) by mouth every 6 (six) hours as needed. (Patient not taking: Reported on 11/02/2019) 25 tablet 0   No current facility-administered medications for this visit.    Allergies as of 11/02/2019 - Review Complete 11/02/2019  Allergen Reaction Noted  . Bactrim Hives and Itching 07/28/2010  . Ciprofloxacin Hives and Itching 10/02/2011  . Other Other (See Comments) 09/30/2011  . Penicillins Hives and Itching   . Percocet [oxycodone-acetaminophen] Other (See Comments) 10/07/2011  . Vancomycin Itching 08/05/2017    Family History  Problem Relation Age of Onset  . Diabetes Mother   . Hypertension Mother   . Hyperlipidemia Mother   . Neuropathy Mother   . Diabetes Maternal Grandmother   . Hypertension Maternal Grandmother   . Heart disease Maternal Grandmother        CHF  . COPD Maternal Grandmother   . Cirrhosis Maternal Grandmother   . Anxiety disorder Maternal Grandmother   . Cancer Maternal Grandfather        lung  . Diabetes Paternal Grandmother   . Diabetes Paternal Grandfather   . Aneurysm Paternal Grandfather   . Anxiety disorder Paternal Grandfather   . Drug abuse Father   . ADD / ADHD Brother   . Stroke Other   . Anxiety disorder Maternal Uncle   . Anxiety disorder Paternal Uncle   . Depression Paternal Uncle   . Anxiety disorder Maternal Aunt     Social History   Socioeconomic History  . Marital status: Single    Spouse name: Not on file  . Number of children: Not on file  . Years of education: Not on file  . Highest education level: Not on file  Occupational History  . Not on file  Tobacco Use  . Smoking status: Current Every Day  Smoker    Packs/day: 0.25    Years: 7.00    Pack years: 1.75    Types: Cigarettes  . Smokeless tobacco: Never Used  Vaping Use  . Vaping Use: Never used  Substance and Sexual Activity  . Alcohol use: Yes    Comment: social use   . Drug use: No  . Sexual activity: Yes    Birth control/protection: None, Implant  Other Topics Concern  . Not on file  Social History Narrative  . Not on file   Social Determinants of Health   Financial Resource Strain:   . Difficulty of Paying Living Expenses: Not on file  Food Insecurity:   . Worried About Programme researcher, broadcasting/film/videounning Out of Food in the Last  Year: Not on file  . Ran Out of Food in the Last Year: Not on file  Transportation Needs:   . Lack of Transportation (Medical): Not on file  . Lack of Transportation (Non-Medical): Not on file  Physical Activity:   . Days of Exercise per Week: Not on file  . Minutes of Exercise per Session: Not on file  Stress:   . Feeling of Stress : Not on file  Social Connections:   . Frequency of Communication with Friends and Family: Not on file  . Frequency of Social Gatherings with Friends and Family: Not on file  . Attends Religious Services: Not on file  . Active Member of Clubs or Organizations: Not on file  . Attends Banker Meetings: Not on file  . Marital Status: Not on file  Intimate Partner Violence:   . Fear of Current or Ex-Partner: Not on file  . Emotionally Abused: Not on file  . Physically Abused: Not on file  . Sexually Abused: Not on file    Subjective: Review of Systems  Constitutional: Negative for chills and fever.  HENT: Negative for congestion and hearing loss.   Eyes: Negative for blurred vision and double vision.  Respiratory: Negative for cough and shortness of breath.   Cardiovascular: Negative for chest pain and palpitations.  Gastrointestinal: Negative for abdominal pain, blood in stool, constipation, diarrhea, heartburn, melena and vomiting.  Genitourinary: Negative for  dysuria and urgency.  Musculoskeletal: Negative for joint pain and myalgias.  Skin: Negative for itching and rash.  Neurological: Negative for dizziness and headaches.  Psychiatric/Behavioral: Negative for depression. The patient is not nervous/anxious.        Objective: BP (!) 141/84   Pulse 71   Temp (!) 97.1 F (36.2 C)   Ht 5\' 3"  (1.6 m)   Wt 248 lb 9.6 oz (112.8 kg)   BMI 44.04 kg/m  Physical Exam Constitutional:      Appearance: Normal appearance. She is obese.  HENT:     Head: Normocephalic and atraumatic.  Eyes:     Extraocular Movements: Extraocular movements intact.     Conjunctiva/sclera: Conjunctivae normal.  Cardiovascular:     Rate and Rhythm: Normal rate and regular rhythm.  Pulmonary:     Effort: Pulmonary effort is normal.     Breath sounds: Normal breath sounds.  Abdominal:     General: Bowel sounds are normal.     Palpations: Abdomen is soft.  Musculoskeletal:        General: No swelling. Normal range of motion.     Cervical back: Normal range of motion and neck supple.  Skin:    General: Skin is warm and dry.     Coloration: Skin is not jaundiced.  Neurological:     General: No focal deficit present.     Mental Status: She is alert and oriented to person, place, and time.  Psychiatric:        Mood and Affect: Mood normal.        Behavior: Behavior normal.      Assessment: *Dysphagia-new, worsening *Diarrhea-chronic  Plan: In regards to patient's worsening dysphagia, Will schedule for EGD to evaluate for peptic ulcer disease, esophagitis, gastritis, H. Pylori, duodenitis, or other. Will also evaluate for esophageal stricture, Schatzki's ring, esophageal web or other.   The risks including infection, bleed, or perforation as well as benefits, limitations, alternatives and imponderables have been reviewed with the patient. Potential for esophageal dilation, biopsy, etc. have also been reviewed.  Questions have been answered. All parties  agreeable.  May need to consider PPI therapy pending endoscopic evaluation.  Regarding her chronic loose stools, this is possibly postcholecystectomy diarrhea.  Discussed performing a colonoscopy at the same time as her EGD to rule out underlying inflammatory bowel disease such as ulcerative colitis inflammatory bowel disease as well as rule out microscopic colitis, patient would like to hold off for now.  We may consider trialing her on colestipol in the future.  Thank you Dr. Carlena Sax for the kind referral. 11/02/2019 9:42 AM   Disclaimer: This note was dictated with voice recognition software. Similar sounding words can inadvertently be transcribed and may not be corrected upon review.

## 2019-11-03 ENCOUNTER — Encounter: Payer: Self-pay | Admitting: *Deleted

## 2019-11-07 ENCOUNTER — Telehealth: Payer: Self-pay | Admitting: *Deleted

## 2019-11-07 ENCOUNTER — Encounter: Payer: Self-pay | Admitting: *Deleted

## 2019-11-07 NOTE — Telephone Encounter (Signed)
CALLED pt. Procedure rescheduled to 12/7 at 1:15pm. Aware will mail new instructions with new pre-op/covid test appt

## 2019-11-20 ENCOUNTER — Telehealth (HOSPITAL_COMMUNITY): Payer: Self-pay | Admitting: Psychiatry

## 2019-11-20 NOTE — Telephone Encounter (Signed)
Left message to schedule F/U appt

## 2019-12-04 ENCOUNTER — Other Ambulatory Visit (HOSPITAL_COMMUNITY): Payer: 59

## 2019-12-05 ENCOUNTER — Ambulatory Visit
Admission: EM | Admit: 2019-12-05 | Discharge: 2019-12-05 | Disposition: A | Discharge status: Home / Self Care | Payer: 59 | Attending: Emergency Medicine | Admitting: Emergency Medicine

## 2019-12-05 ENCOUNTER — Ambulatory Visit (INDEPENDENT_AMBULATORY_CARE_PROVIDER_SITE_OTHER): Payer: 59

## 2019-12-05 ENCOUNTER — Other Ambulatory Visit: Payer: Self-pay

## 2019-12-05 DIAGNOSIS — M79672 Pain in left foot: Secondary | ICD-10-CM

## 2019-12-05 MED ORDER — MELOXICAM 15 MG PO TABS: 15.0000 mg | ORAL_TABLET | Freq: Every day | ORAL | 0 refills | Status: DC

## 2019-12-05 NOTE — ED Triage Notes (Signed)
Pt presents with left foot injury that occurred this morning

## 2019-12-05 NOTE — Discharge Instructions (Signed)
X-rays negative for fracture or dislocation Continue conservative management of rest, ice, and elevation Take mobic as needed for pain relief (may cause abdominal discomfort, ulcers, and GI bleeds avoid taking with other NSAIDs) Follow up with PCP if symptoms persist Return or go to the ER if you have any new or worsening symptoms (fever, chills, chest pain, redness, swelling, bruising, deformity, etc...)  

## 2019-12-05 NOTE — ED Provider Notes (Signed)
[facility]   [id number] 12/05/19 Arrival Time: 1558  CC: LT foot PAIN  SUBJECTIVE: History from: patient. Kathleen Velasquez is a 29 y.o. female complains of LT foot pain and injury that occurred this earlier today.  Symptoms began after hitting metal object in the ground.  Localizes the pain to the 2nd and 3rd toes as well as top of foot.  Describes the pain as constant, throbbing, dull and achy in character.  Has tried elevating foot at home without relief.  Symptoms are made worse with walking and weight-bearing.  Denies similar symptoms in the past.  Complains of associated swelling.  Denies fever, chills, erythema, ecchymosis, effusion, weakness, numbness and tingling.  ROS: As per HPI.  All other pertinent ROS negative.     Past Medical History:  Diagnosis Date  . Abscess    right buttocks/thigh  . Anxiety   . Depression   . DUB (dysfunctional uterine bleeding) 05/25/2012  . Hydradenitis   . Hypothyroidism   . Polycystic ovaries   . Thyroid disease    hypothyroidism   Past Surgical History:  Procedure Laterality Date  . ADENOIDECTOMY    . CHOLECYSTECTOMY  01/27/2012   Procedure: LAPAROSCOPIC CHOLECYSTECTOMY;  Surgeon: Fabio Bering, MD;  Location: [facility];  Service: General;  Laterality: N/A;  . HERNIA REPAIR Left    LIH  . HYDRADENITIS EXCISION  10/02/2011   Procedure: EXCISION HYDRADENITIS AXILLA;  Surgeon: Fabio Bering, MD;  Location: [facility];  Service: General;  Laterality: Right;  Right Axillary Dissection  . HYDRADENITIS EXCISION Left 10/19/2013   Procedure: INCISION OF HIDRADENITIS SUPPURATIVA LEFT AXILLA;  Surgeon: Marlane Hatcher, MD;  Location: [facility];  Service: General;  Laterality: Left;  . HYDRADENITIS EXCISION Left 09/09/2018   Procedure: EXCISION HIDRADENITIS AXILLA, LEFT;  Surgeon: Franky Macho, MD;  Location: [facility];  Service: General;  Laterality: Left;  . TONSILLECTOMY     Allergies  Allergen Reactions  . Bactrim Hives and Itching  .  Ciprofloxacin Hives and Itching    CRNA Discontinued preop antibiotic  IVPB  Cipro in OR after developed itching and hives left arm.  Received Benadryl with relief noted. Dr.Gonzalez and Dr. Leticia Penna informed. DDallasRN  . Other Other (See Comments)    [place] causes a hives, swelling, itching, and anaphylaxis.   Marland Kitchen Penicillins Hives and Itching    Has patient had a PCN reaction causing immediate rash, facial/tongue/throat swelling, SOB or lightheadedness with hypotension: Yes Has patient had a PCN reaction causing severe rash involving mucus membranes or skin necrosis: No Has patient had a PCN reaction that required hospitalization No Has patient had a PCN reaction occurring within the last 10 years: No If all of the above answers are "NO", then may proceed with Cephalosporin use.   Marland Kitchen Percocet [Oxycodone-Acetaminophen] Other (See Comments)    Face flushes  . Vancomycin Itching   No current facility-administered medications on file prior to encounter.   Current Outpatient Medications on File Prior to Encounter  Medication Sig Dispense Refill  . acetaminophen (TYLENOL) 500 MG tablet Take 1 tablet (500 mg total) by mouth every 6 (six) hours as needed. 30 tablet 0  . amphetamine-dextroamphetamine (ADDERALL) 20 MG tablet Take 1 tablet (20 mg total) by mouth 2 (two) times daily. 60 tablet 0  . amphetamine-dextroamphetamine (ADDERALL) 20 MG tablet Take 1 tablet (20 mg total) by mouth 2 (two) times daily. 60 tablet 0  . amphetamine-dextroamphetamine (ADDERALL) 20 MG tablet Take 1 tablet (20 mg  total) by mouth 2 (two) times daily. 60 tablet 0  . cetirizine (ZYRTEC ALLERGY) 10 MG tablet Take 1 tablet (10 mg total) by mouth daily. 30 tablet 0  . clindamycin (CLEOCIN) 300 MG capsule Take 300 mg by mouth 2 (two) times daily.  (Patient not taking: Reported on 11/02/2019)    . clonazePAM (KLONOPIN) 1 MG tablet TAKE 1 TABLET(1 MG) BY MOUTH TWICE DAILY AS NEEDED FOR ANXIETY 60 tablet 2  . cyclobenzaprine  (FLEXERIL) 10 MG tablet Take 1 tablet (10 mg total) by mouth 3 (three) times daily as needed for muscle spasms. 30 tablet 0  . escitalopram (LEXAPRO) 20 MG tablet Take 1 tablet (20 mg total) by mouth daily. 90 tablet 2  . HYDROmorphone (DILAUDID) 2 MG tablet Dilaudid 2 mg tablet  1 q 4-6 hrs prn pain (Patient not taking: Reported on 11/02/2019)    . naproxen (NAPROSYN) 500 MG tablet TK 1 T PO BID (Patient not taking: Reported on 11/02/2019)    . NUVARING 0.12-0.015 MG/24HR vaginal ring INSERT VAGINALLY AND LEAVE IN. PLACE FOR 3 CONSECUTIVE WEEKS, THEN REMOVE FOR 1 WEEK (Patient taking differently: Place 1 each vaginally every 28 (twenty-eight) days. ) 1 each 10  . ondansetron (ZOFRAN) 4 MG tablet Zofran 4 mg tablet  1 q 6-8 hrs as needed for post op nausea (Patient not taking: Reported on 11/02/2019)    . predniSONE (DELTASONE) 10 MG tablet Take 2 tablets (20 mg total) by mouth daily. (Patient not taking: Reported on 11/02/2019) 15 tablet 0  . traMADol (ULTRAM) 50 MG tablet Take 1 tablet (50 mg total) by mouth every 6 (six) hours as needed. (Patient not taking: Reported on 11/02/2019) 25 tablet 0   Social History   Socioeconomic History  . Marital status: Single    Spouse name: Not on file  . Number of children: Not on file  . Years of education: Not on file  . Highest education level: Not on file  Occupational History  . Not on file  Tobacco Use  . Smoking status: Current Every Day Smoker    Packs/day: 0.25    Years: 7.00    Pack years: 1.75    Types: Cigarettes  . Smokeless tobacco: Never Used  Vaping Use  . Vaping Use: Never used  Substance and Sexual Activity  . Alcohol use: Yes    Comment: social use   . Drug use: No  . Sexual activity: Yes    Birth control/protection: None, Implant  Other Topics Concern  . Not on file  Social History Narrative  . Not on file   Social Determinants of Health   Financial Resource Strain:   . Difficulty of Paying Living Expenses: Not on  file  Food Insecurity:   . Worried About Programme researcher, broadcasting/film/video in the Last Year: Not on file  . Ran Out of Food in the Last Year: Not on file  Transportation Needs:   . Lack of Transportation (Medical): Not on file  . Lack of Transportation (Non-Medical): Not on file  Physical Activity:   . Days of Exercise per Week: Not on file  . Minutes of Exercise per Session: Not on file  Stress:   . Feeling of Stress : Not on file  Social Connections:   . Frequency of Communication with Friends and Family: Not on file  . Frequency of Social Gatherings with Friends and Family: Not on file  . Attends Religious Services: Not on file  . Active Member of Clubs  or Organizations: Not on file  . Attends Banker Meetings: Not on file  . Marital Status: Not on file  Intimate Partner Violence:   . Fear of Current or Ex-Partner: Not on file  . Emotionally Abused: Not on file  . Physically Abused: Not on file  . Sexually Abused: Not on file   Family History  Problem Relation Age of Onset  . Diabetes Mother   . Hypertension Mother   . Hyperlipidemia Mother   . Neuropathy Mother   . Diabetes Maternal Grandmother   . Hypertension Maternal Grandmother   . Heart disease Maternal Grandmother        CHF  . COPD Maternal Grandmother   . Cirrhosis Maternal Grandmother   . Anxiety disorder Maternal Grandmother   . Cancer Maternal Grandfather        lung  . Diabetes Paternal Grandmother   . Diabetes Paternal Grandfather   . Aneurysm Paternal Grandfather   . Anxiety disorder Paternal Grandfather   . Drug abuse Father   . ADD / ADHD Brother   . Stroke Other   . Anxiety disorder Maternal Uncle   . Anxiety disorder Paternal Uncle   . Depression Paternal Uncle   . Anxiety disorder Maternal Aunt     OBJECTIVE:  Vitals:   12/05/19 1615  BP: 131/84  Pulse: 89  Resp: 18  Temp: 98.6 F (37 C)  SpO2: 97%    General appearance: ALERT; in no acute distress.  Head: NCAT Lungs: Normal  respiratory effort CV: Dorsalis pedis pulse 2+ Musculoskeletal: LT foot Inspection: Skin warm, dry, clear and intact without obvious erythema, effusion, or ecchymosis.  Palpation: diffusely TTP over 2nd and 3rd distal MTs and digits Strength: deferred Skin: warm and dry Neurologic: Ambulates with minimal difficulty; Sensation intact about the upper/ lower extremities Psychological: alert and cooperative; normal mood and affect  DIAGNOSTIC STUDIES:  DG Foot Complete Left  Result Date: 12/05/2019 CLINICAL DATA:  Left foot pain EXAM: LEFT FOOT - COMPLETE 3+ VIEW COMPARISON:  None. FINDINGS: Plantar and posterior calcaneal spurs. No acute bony abnormality. Specifically, no fracture, subluxation, or dislocation. IMPRESSION: No acute bony abnormality. Electronically Signed   By: Charlett Nose M.D.   On: 12/05/2019 16:38    I have reviewed the x-rays myself and the radiologist interpretation. I am in agreement with the radiologist interpretation.     ASSESSMENT & PLAN:  1. Left foot pain     Meds ordered this encounter  Medications  . meloxicam (MOBIC) 15 MG tablet    Sig: Take 1 tablet (15 mg total) by mouth daily.    Dispense:  30 tablet    Refill:  0    Order Specific Question:   Supervising Provider    Answer:   Eustace Moore [[id number]]   X-rays negative for fracture or dislocation Continue conservative management of rest, ice, and elevation Take mobic as needed for pain relief (may cause abdominal discomfort, ulcers, and GI bleeds avoid taking with other NSAIDs) Follow up with PCP if symptoms persist Return or go to the ER if you have any new or worsening symptoms (fever, chills, chest pain, redness, swelling, bruising, deformity, etc...)   Reviewed expectations re: course of current medical issues. Questions answered. Outlined signs and symptoms indicating need for more acute intervention. Patient verbalized understanding. After Visit Summary given.    Nataly, [place],  PA-C 12/05/19 1654

## 2019-12-14 NOTE — Patient Instructions (Signed)
Kathleen Velasquez  12/14/2019     @PREFPERIOPPHARMACY @   Your procedure is scheduled on  12/19/2019.  Report to 14/07/2019 at   1115  A.M.  Call this number if you have problems the morning of surgery:  [phone]   Remember:  Follow the diet instructions given to you by the office.                       Take these medicines the morning of surgery with A SIP OF WATER  Adderall, zyrtec, clonazepam, lexapro, mobic(if needed).    Do not wear jewelry, make-up or nail polish.  Do not wear lotions, powders, or perfumes. Please wear deodorant and brush your teeth.  Do not shave 48 hours prior to surgery.  Men may shave face and neck.  Do not bring valuables to the hospital.  [facility] is not responsible for any belongings or valuables.  Contacts, dentures or bridgework may not be worn into surgery.  Leave your suitcase in the car.  After surgery it may be brought to your room.  For patients admitted to the hospital, discharge time will be determined by your treatment team.  Patients discharged the day of surgery will not be allowed to drive home.   Name and phone number of your driver:   family Special instructions:   DO NOT smoke the morning of your procedure.  Please read over the following fact sheets that you were given. Anesthesia Post-op Instructions and Care and Recovery After Surgery       Upper Endoscopy, Adult, Care After This sheet gives you information about how to care for yourself after your procedure. Your health care provider may also give you more specific instructions. If you have problems or questions, contact your health care provider. What can I expect after the procedure? After the procedure, it is common to have:  A sore throat.  Mild stomach pain or discomfort.  Bloating.  Nausea. Follow these instructions at home:   Follow instructions from your health care provider about what to eat or drink after your procedure.  Return to your  normal activities as told by your health care provider. Ask your health care provider what activities are safe for you.  Take over-the-counter and prescription medicines only as told by your health care provider.  Do not drive for 24 hours if you were given a sedative during your procedure.  Keep all follow-up visits as told by your health care provider. This is important. Contact a health care provider if you have:  A sore throat that lasts longer than one day.  Trouble swallowing. Get help right away if:  You vomit blood or your vomit looks like coffee grounds.  You have: ? A fever. ? Bloody, black, or tarry stools. ? A severe sore throat or you cannot swallow. ? Difficulty breathing. ? Severe pain in your chest or abdomen. Summary  After the procedure, it is common to have a sore throat, mild stomach discomfort, bloating, and nausea.  Do not drive for 24 hours if you were given a sedative during the procedure.  Follow instructions from your health care provider about what to eat or drink after your procedure.  Return to your normal activities as told by your health care provider. This information is not intended to replace advice given to you by your health care provider. Make sure you discuss any questions you have with your  health care provider. Document Revised: 06/22/2017 Document Reviewed: 05/31/2017 Elsevier Patient Education  [place].  Esophageal Dilatation Esophageal dilatation, also called esophageal dilation, is a procedure to widen or open (dilate) a blocked or narrowed part of the esophagus. The esophagus is the part of the body that moves food and liquid from the mouth to the stomach. You may need this procedure if:  You have a buildup of scar tissue in your esophagus that makes it difficult, painful, or impossible to swallow. This can be caused by gastroesophageal reflux disease (GERD).  You have cancer of the esophagus.  There is a problem with  how food moves through your esophagus. In some cases, you may need this procedure repeated at a later time to dilate the esophagus gradually. Tell a health care provider about:  Any allergies you have.  All medicines you are taking, including vitamins, herbs, eye drops, creams, and over-the-counter medicines.  Any problems you or family members have had with anesthetic medicines.  Any blood disorders you have.  Any surgeries you have had.  Any medical conditions you have.  Any antibiotic medicines you are required to take before dental procedures.  Whether you are pregnant or may be pregnant. What are the risks? Generally, this is a safe procedure. However, problems may occur, including:  Bleeding due to a tear in the lining of the esophagus.  A hole (perforation) in the esophagus. What happens before the procedure?  Follow instructions from your health care provider about eating or drinking restrictions.  Ask your health care provider about changing or stopping your regular medicines. This is especially important if you are taking diabetes medicines or blood thinners.  Plan to have someone take you home from the hospital or clinic.  Plan to have a responsible adult care for you for at least 24 hours after you leave the hospital or clinic. This is important. What happens during the procedure?  You may be given a medicine to help you relax (sedative).  A numbing medicine may be sprayed into the back of your throat, or you may gargle the medicine.  Your health care provider may perform the dilatation using various surgical instruments, such as: ? Simple dilators. This instrument is carefully placed in the esophagus to stretch it. ? Guided wire bougies. This involves using an endoscope to insert a wire into the esophagus. A dilator is passed over this wire to enlarge the esophagus. Then the wire is removed. ? Balloon dilators. An endoscope with a small balloon at the end is  inserted into the esophagus. The balloon is inflated to stretch the esophagus and open it up. The procedure may vary among health care providers and hospitals. What happens after the procedure?  Your blood pressure, heart rate, breathing rate, and blood oxygen level will be monitored until the medicines you were given have worn off.  Your throat may feel slightly sore and numb. This will improve slowly over time.  You will not be allowed to eat or drink until your throat is no longer numb.  When you are able to drink, urinate, and sit on the edge of the bed without nausea or dizziness, you may be able to return home. Follow these instructions at home:  Take over-the-counter and prescription medicines only as told by your health care provider.  Do not drive for 24 hours if you were given a sedative during your procedure.  You should have a responsible adult with you for 24 hours after the  procedure.  Follow instructions from your health care provider about any eating or drinking restrictions.  Do not use any products that contain nicotine or tobacco, such as cigarettes and e-cigarettes. If you need help quitting, ask your health care provider.  Keep all follow-up visits as told by your health care provider. This is important. Get help right away if you:  Have a fever.  Have chest pain.  Have pain that is not relieved by medication.  Have trouble breathing.  Have trouble swallowing.  Vomit blood. Summary  Esophageal dilatation, also called esophageal dilation, is a procedure to widen or open (dilate) a blocked or narrowed part of the esophagus.  Plan to have someone take you home from the hospital or clinic.  For this procedure, a numbing medicine may be sprayed into the back of your throat, or you may gargle the medicine.  Do not drive for 24 hours if you were given a sedative during your procedure. This information is not intended to replace advice given to you by your  health care provider. Make sure you discuss any questions you have with your health care provider. Document Revised: 10/26/2018 Document Reviewed: 11/03/2016 Elsevier Patient Education  2020 [employer] Monitored Anesthesia Care, Care After These instructions provide you with information about caring for yourself after your procedure. Your health care provider may also give you more specific instructions. Your treatment has been planned according to current medical practices, but problems sometimes occur. Call your health care provider if you have any problems or questions after your procedure. What can I expect after the procedure? After your procedure, you may:  Feel sleepy for several hours.  Feel clumsy and have poor balance for several hours.  Feel forgetful about what happened after the procedure.  Have poor judgment for several hours.  Feel nauseous or vomit.  Have a sore throat if you had a breathing tube during the procedure. Follow these instructions at home: For at least 24 hours after the procedure:      Have a responsible adult stay with you. It is important to have someone help care for you until you are awake and alert.  Rest as needed.  Do not: ? Participate in activities in which you could fall or become injured. ? Drive. ? Use heavy machinery. ? Drink alcohol. ? Take sleeping pills or medicines that cause drowsiness. ? Make important decisions or sign legal documents. ? Take care of children on your own. Eating and drinking  Follow the diet that is recommended by your health care provider.  If you vomit, drink water, juice, or soup when you can drink without vomiting.  Make sure you have little or no nausea before eating solid foods. General instructions  Take over-the-counter and prescription medicines only as told by your health care provider.  If you have sleep apnea, surgery and certain medicines can increase your risk for breathing problems.  Follow instructions from your health care provider about wearing your sleep device: ? Anytime you are sleeping, including during daytime naps. ? While taking prescription pain medicines, sleeping medicines, or medicines that make you drowsy.  If you smoke, do not smoke without supervision.  Keep all follow-up visits as told by your health care provider. This is important. Contact a health care provider if:  You keep feeling nauseous or you keep vomiting.  You feel light-headed.  You develop a rash.  You have a fever. Get help right away if:  You have trouble breathing. Summary  For several hours after your procedure, you may feel sleepy and have poor judgment.  Have a responsible adult stay with you for at least 24 hours or until you are awake and alert. This information is not intended to replace advice given to you by your health care provider. Make sure you discuss any questions you have with your health care provider. Document Revised: 03/29/2017 Document Reviewed: 04/21/2015 Elsevier Patient Education  [place].

## 2019-12-18 ENCOUNTER — Other Ambulatory Visit: Payer: Self-pay

## 2019-12-18 ENCOUNTER — Other Ambulatory Visit (HOSPITAL_COMMUNITY)
Admission: RE | Admit: 2019-12-18 | Discharge: 2019-12-18 | Disposition: A | Discharge status: Home / Self Care | Payer: 59 | Source: Ambulatory Visit | Attending: Internal Medicine | Admitting: Internal Medicine

## 2019-12-18 ENCOUNTER — Encounter (HOSPITAL_COMMUNITY): Payer: Self-pay

## 2019-12-18 ENCOUNTER — Encounter (HOSPITAL_COMMUNITY)
Admission: RE | Admit: 2019-12-18 | Discharge: 2019-12-18 | Disposition: A | Discharge status: Home / Self Care | Payer: 59 | Source: Ambulatory Visit | Attending: Internal Medicine | Admitting: Internal Medicine

## 2019-12-18 DIAGNOSIS — Z20822 Contact with and (suspected) exposure to covid-19: Secondary | ICD-10-CM | POA: Insufficient documentation

## 2019-12-18 DIAGNOSIS — Z01812 Encounter for preprocedural laboratory examination: Secondary | ICD-10-CM | POA: Insufficient documentation

## 2019-12-18 LAB — SARS CORONAVIRUS 2 (TAT 6-24 HRS): SARS Coronavirus 2: NEGATIVE

## 2019-12-18 LAB — PREGNANCY, URINE: Preg Test, Ur: NEGATIVE

## 2019-12-19 ENCOUNTER — Ambulatory Visit (HOSPITAL_COMMUNITY)
Admission: RE | Admit: 2019-12-19 | Discharge: 2019-12-19 | Disposition: A | Discharge status: Home / Self Care | Payer: 59 | Attending: Internal Medicine | Admitting: Internal Medicine

## 2019-12-19 ENCOUNTER — Encounter (HOSPITAL_COMMUNITY): Payer: Self-pay

## 2019-12-19 ENCOUNTER — Encounter (HOSPITAL_COMMUNITY)
Admission: RE | Disposition: A | Discharge status: Home / Self Care | Payer: Self-pay | Source: Home / Self Care | Attending: Internal Medicine

## 2019-12-19 ENCOUNTER — Ambulatory Visit (HOSPITAL_COMMUNITY): Payer: 59 | Admitting: Anesthesiology

## 2019-12-19 DIAGNOSIS — Z885 Allergy status to narcotic agent status: Secondary | ICD-10-CM | POA: Insufficient documentation

## 2019-12-19 DIAGNOSIS — K209 Esophagitis, unspecified without bleeding: Secondary | ICD-10-CM | POA: Insufficient documentation

## 2019-12-19 DIAGNOSIS — Z793 Long term (current) use of hormonal contraceptives: Secondary | ICD-10-CM | POA: Insufficient documentation

## 2019-12-19 DIAGNOSIS — K222 Esophageal obstruction: Secondary | ICD-10-CM | POA: Insufficient documentation

## 2019-12-19 DIAGNOSIS — K319 Disease of stomach and duodenum, unspecified: Secondary | ICD-10-CM | POA: Diagnosis not present

## 2019-12-19 DIAGNOSIS — Z79899 Other long term (current) drug therapy: Secondary | ICD-10-CM | POA: Insufficient documentation

## 2019-12-19 DIAGNOSIS — Z881 Allergy status to other antibiotic agents status: Secondary | ICD-10-CM | POA: Diagnosis not present

## 2019-12-19 DIAGNOSIS — K2950 Unspecified chronic gastritis without bleeding: Secondary | ICD-10-CM | POA: Diagnosis not present

## 2019-12-19 DIAGNOSIS — K2970 Gastritis, unspecified, without bleeding: Secondary | ICD-10-CM

## 2019-12-19 DIAGNOSIS — F17210 Nicotine dependence, cigarettes, uncomplicated: Secondary | ICD-10-CM | POA: Insufficient documentation

## 2019-12-19 DIAGNOSIS — Z880 Allergy status to penicillin: Secondary | ICD-10-CM | POA: Insufficient documentation

## 2019-12-19 DIAGNOSIS — R1310 Dysphagia, unspecified: Secondary | ICD-10-CM | POA: Diagnosis not present

## 2019-12-19 HISTORY — PX: BALLOON DILATION: SHX5330

## 2019-12-19 HISTORY — PX: ESOPHAGOGASTRODUODENOSCOPY (EGD) WITH PROPOFOL: SHX5813

## 2019-12-19 HISTORY — PX: BIOPSY: SHX5522

## 2019-12-19 LAB — KOH PREP: KOH Prep: NONE SEEN

## 2019-12-19 SURGERY — ESOPHAGOGASTRODUODENOSCOPY (EGD) WITH PROPOFOL
Anesthesia: General

## 2019-12-19 MED ORDER — LACTATED RINGERS IV SOLN: INTRAVENOUS | Status: DC | PRN

## 2019-12-19 MED ORDER — CHLORHEXIDINE GLUCONATE CLOTH 2 % EX PADS: 6.0000 | MEDICATED_PAD | Freq: Once | CUTANEOUS | Status: DC

## 2019-12-19 MED ORDER — PROPOFOL 10 MG/ML IV BOLUS
INTRAVENOUS | Status: DC | PRN
  Administered 2019-12-19: 175 ug/kg/min via INTRAVENOUS
  Administered 2019-12-19: 100 mg via INTRAVENOUS

## 2019-12-19 MED ORDER — LIDOCAINE VISCOUS HCL 2 % MT SOLN
15.0000 mL | Freq: Once | OROMUCOSAL | Status: AC
  Administered 2019-12-19: 15 mL via OROMUCOSAL

## 2019-12-19 MED ORDER — LIDOCAINE HCL (CARDIAC) PF 100 MG/5ML IV SOSY
PREFILLED_SYRINGE | INTRAVENOUS | Status: DC | PRN
  Administered 2019-12-19: 60 mg via INTRAVENOUS

## 2019-12-19 MED ORDER — LIDOCAINE VISCOUS HCL 2 % MT SOLN
OROMUCOSAL | Status: AC
  Filled 2019-12-19: qty 15

## 2019-12-19 MED ORDER — LACTATED RINGERS IV SOLN
Freq: Once | INTRAVENOUS | Status: AC
  Administered 2019-12-19: 1000 mL via INTRAVENOUS

## 2019-12-19 MED ORDER — GLYCOPYRROLATE 0.2 MG/ML IJ SOLN
0.2000 mg | Freq: Once | INTRAMUSCULAR | Status: AC
  Administered 2019-12-19: 0.2 mg via INTRAVENOUS

## 2019-12-19 MED ORDER — GLYCOPYRROLATE 0.2 MG/ML IJ SOLN
INTRAMUSCULAR | Status: AC
  Filled 2019-12-19: qty 1

## 2019-12-19 MED ORDER — OMEPRAZOLE 20 MG PO CPDR: 20.0000 mg | DELAYED_RELEASE_CAPSULE | Freq: Two times a day (BID) | ORAL | 5 refills | Status: DC

## 2019-12-19 NOTE — Op Note (Signed)
[facility] Patient Name: Kathleen Velasquez Procedure Date: 12/19/2019 11:36 AM MRN: [id number] Date of Birth: 1990-03-24 Attending MD: Elon Alas. Abbey Chatters DO CSN: [id number] Age: 29 Admit Type: Outpatient Procedure:                Upper GI endoscopy Indications:              Dysphagia Providers:                Elon Alas. Abbey Chatters, DO, Janeece Riggers, RN, Nelma Rothman,                            Technician Referring MD:              Medicines:                See the Anesthesia note for documentation of the                            administered medications Complications:            No immediate complications. Estimated Blood Loss:     Estimated blood loss was minimal. Procedure:                Pre-Anesthesia Assessment:                           - The anesthesia plan was to use monitored                            anesthesia care (MAC).                           After obtaining informed consent, the endoscope was                            passed under direct vision. Throughout the                            procedure, the patient's blood pressure, pulse, and                            oxygen saturations were monitored continuously. The                            [id number]-H190 ([id number]) scope was introduced through the                            mouth, and advanced to the second part of duodenum.                            The upper GI endoscopy was accomplished without                            difficulty. The patient tolerated the procedure                            well. Scope In: 11:49:50 AM Scope Out:  11:55:05 AM Total Procedure Duration: 0 hours 5 minutes 15 seconds  Findings:      Non-severe esophagitis with no bleeding was found in the middle third of       the esophagus. Cells for cytology were obtained by brushing.      One benign-appearing, intrinsic mild stenosis was found in the lower       third of the esophagus. The stenosis was traversed. A TTS dilator was       passed  through the scope. Dilation with an 18-19-20 mm balloon dilator       was performed to 20 mm. The dilation site was examined and showed       moderate improvement in luminal narrowing.      Diffuse moderate inflammation characterized by erosions and erythema was       found in the entire examined stomach. Biopsies were taken with a cold       forceps for Helicobacter pylori testing.      The duodenal bulb, first portion of the duodenum and second portion of       the duodenum were normal. Impression:               - Non-severe esophagitis with no bleeding. Cells                            for cytology obtained.                           - Benign-appearing esophageal stenosis. Dilated.                           - Gastritis. Biopsied.                           - Normal duodenal bulb, first portion of the                            duodenum and second portion of the duodenum. Moderate Sedation:      Per Anesthesia Care Recommendation:           - Patient has a contact number available for                            emergencies. The signs and symptoms of potential                            delayed complications were discussed with the                            patient. Return to normal activities tomorrow.                            Written discharge instructions were provided to the                            patient.                           - Resume previous diet.                           -  Continue present medications.                           - Await pathology results.                           - Use Prilosec (omeprazole) 20 mg PO BID for 8                            weeks.                           - Return to GI clinic in 3 months. Procedure Code(s):        --- Professional ---                           [phone], Esophagogastroduodenoscopy, flexible,                            transoral; with transendoscopic balloon dilation of                            esophagus (less than 30 mm  diameter)                           43239, 59, Esophagogastroduodenoscopy, flexible,                            transoral; with biopsy, single or multiple Diagnosis Code(s):        --- Professional ---                           K20.90, Esophagitis, unspecified without bleeding                           K22.2, Esophageal obstruction                           K29.70, Gastritis, unspecified, without bleeding                           R13.10, Dysphagia, unspecified CPT copyright 2019 American Medical Association. All rights reserved. The codes documented in this report are preliminary and upon coder review may  be revised to meet current compliance requirements. Elon Alas. Abbey Chatters, DO [place] Abbey Chatters, DO 12/19/2019 11:58:21 AM This report has been signed electronically. Number of Addenda: 0

## 2019-12-19 NOTE — Anesthesia Procedure Notes (Signed)
Date/Time: 12/19/2019 11:53 AM Performed by: Julian Reil, CRNA Pre-anesthesia Checklist: Patient identified, Emergency Drugs available, Suction available and Patient being monitored Patient Re-evaluated:Patient Re-evaluated prior to induction Oxygen Delivery Method: Nasal cannula Induction Type: IV induction Placement Confirmation: positive ETCO2

## 2019-12-19 NOTE — Anesthesia Postprocedure Evaluation (Signed)
Anesthesia Post Note  Patient: Kathleen Velasquez  Procedure(s) Performed: ESOPHAGOGASTRODUODENOSCOPY (EGD) WITH PROPOFOL (N/A ) BALLOON DILATION (N/A ) BIOPSY  Patient location during evaluation: PACU Anesthesia Type: General Level of consciousness: awake and alert and oriented Pain management: pain level controlled Vital Signs Assessment: post-procedure vital signs reviewed and stable Respiratory status: spontaneous breathing and respiratory function stable Cardiovascular status: blood pressure returned to baseline and stable Postop Assessment: no apparent nausea or vomiting Anesthetic complications: no   No complications documented.   Last Vitals:  Vitals:   12/19/19 1200 12/19/19 1218  BP: (!) 108/40   Pulse: 69 72  Resp: 15 14  Temp: 36.7 C 36.5 C  SpO2: 99% 100%    Last Pain:  Vitals:   12/19/19 1218  TempSrc: Oral  PainSc: 0-No pain                 Pama Roskos C [place]

## 2019-12-19 NOTE — Discharge Instructions (Addendum)
EGD Discharge instructions Please read the instructions outlined below and refer to this sheet in the next few weeks. These discharge instructions provide you with general information on caring for yourself after you leave the hospital. Your doctor may also give you specific instructions. While your treatment has been planned according to the most current medical practices available, unavoidable complications occasionally occur. If you have any problems or questions after discharge, please call your doctor. ACTIVITY  You may resume your regular activity but move at a slower pace for the next 24 hours.   Take frequent rest periods for the next 24 hours.   Walking will help expel (get rid of) the air and reduce the bloated feeling in your abdomen.   No driving for 24 hours (because of the anesthesia (medicine) used during the test).   You may shower.   Do not sign any important legal documents or operate any machinery for 24 hours (because of the anesthesia used during the test).  NUTRITION  Drink plenty of fluids.   You may resume your normal diet.   Begin with a light meal and progress to your normal diet.   Avoid alcoholic beverages for 24 hours or as instructed by your caregiver.  MEDICATIONS  You may resume your normal medications unless your caregiver tells you otherwise.  WHAT YOU CAN EXPECT TODAY  You may experience abdominal discomfort such as a feeling of fullness or "gas" pains.  FOLLOW-UP  Your doctor will discuss the results of your test with you.  SEEK IMMEDIATE MEDICAL ATTENTION IF ANY OF THE FOLLOWING OCCUR:  Excessive nausea (feeling sick to your stomach) and/or vomiting.   Severe abdominal pain and distention (swelling).   Trouble swallowing.   Temperature over 101 F (37.8 C).   Rectal bleeding or vomiting of blood.    Your EGD showed moderate amount inflammation in your stomach.  I biopsied this to rule out infection with a bacteria called H. pylori.   I am going to start you on a new medication called omeprazole 20 mg twice daily.  I want you to take this 30 minutes before breakfast and 30 minutes before dinner for the next 8 weeks.  After 8 weeks you can go down to once daily.  Await pathology results, my office will contact you.  You did have a slight narrowing of your esophagus and I dilated with a balloon today.  Hopefully this helps with your swallowing.  Follow-up with GI in 3 months or sooner if needed.  I hope you have a great rest of your week!  Hennie Duos. Marletta Lor, D.O. Gastroenterology and Hepatology [facility]

## 2019-12-19 NOTE — Anesthesia Preprocedure Evaluation (Signed)
Anesthesia Evaluation  Patient identified by MRN, date of birth, ID band Patient awake    Reviewed: Allergy & Precautions, NPO status , Patient's Chart, lab work & pertinent test results  History of Anesthesia Complications Negative for: history of anesthetic complications  Airway Mallampati: II  TM Distance: >3 FB Neck ROM: Full    Dental  (+) Edentulous Upper, Missing, Dental Advisory Given, Poor Dentition   Pulmonary Current Smoker and Patient abstained from smoking.,    Pulmonary exam normal breath sounds clear to auscultation       Cardiovascular Exercise Tolerance: Good Normal cardiovascular exam Rhythm:Regular Rate:Normal     Neuro/Psych PSYCHIATRIC DISORDERS Anxiety Depression    GI/Hepatic GERD  ,  Endo/Other  Hypothyroidism Morbid obesity  Renal/GU      Musculoskeletal   Abdominal   Peds  (+) ATTENTION DEFICIT DISORDER WITHOUT HYPERACTIVITY Hematology   Anesthesia Other Findings   Reproductive/Obstetrics                             Anesthesia Physical Anesthesia Plan  ASA: III  Anesthesia Plan: General   Post-op Pain Management:    Induction: Intravenous  PONV Risk Score and Plan: TIVA  Airway Management Planned: Nasal Cannula and Natural Airway  Additional Equipment:   Intra-op Plan:   Post-operative Plan:   Informed Consent: I have reviewed the patients History and Physical, chart, labs and discussed the procedure including the risks, benefits and alternatives for the proposed anesthesia with the patient or authorized representative who has indicated his/her understanding and acceptance.     Dental advisory given  Plan Discussed with: CRNA and Surgeon  Anesthesia Plan Comments:         Anesthesia Quick Evaluation

## 2019-12-19 NOTE — H&P (Signed)
Primary Care Physician:  Elfredia Nevins, MD Primary Gastroenterologist:  Dr. Marletta Lor  Pre-Procedure History & Physical: HPI:  Kathleen Velasquez is a 29 y.o. female is here for an EGD with possible dilation due to history of dyspahgia.   No melena or hematochezia.  No abdominal pain or unintentional weight loss.  No change in bowel habits.  Not currently taking PPI  Past Medical History:  Diagnosis Date  . Abscess    right buttocks/thigh  . Anxiety   . Depression   . DUB (dysfunctional uterine bleeding) 05/25/2012  . Hydradenitis   . Hypothyroidism   . Polycystic ovaries   . Thyroid disease    hypothyroidism    Past Surgical History:  Procedure Laterality Date  . ADENOIDECTOMY    . CHOLECYSTECTOMY  01/27/2012   Procedure: LAPAROSCOPIC CHOLECYSTECTOMY;  Surgeon: Fabio Bering, MD;  Location: [facility];  Service: General;  Laterality: N/A;  . HERNIA REPAIR Left    LIH  . HYDRADENITIS EXCISION  10/02/2011   Procedure: EXCISION HYDRADENITIS AXILLA;  Surgeon: Fabio Bering, MD;  Location: [facility];  Service: General;  Laterality: Right;  Right Axillary Dissection  . HYDRADENITIS EXCISION Left 10/19/2013   Procedure: INCISION OF HIDRADENITIS SUPPURATIVA LEFT AXILLA;  Surgeon: Marlane Hatcher, MD;  Location: [facility];  Service: General;  Laterality: Left;  . HYDRADENITIS EXCISION Left 09/09/2018   Procedure: EXCISION HIDRADENITIS AXILLA, LEFT;  Surgeon: Franky Macho, MD;  Location: [facility];  Service: General;  Laterality: Left;  . TONSILLECTOMY      Prior to Admission medications   Medication Sig Start Date End Date Taking? Authorizing Provider  acetaminophen (TYLENOL) 325 MG tablet Take 650 mg by mouth every 6 (six) hours as needed for moderate pain or headache.   Yes [provider]  amphetamine-dextroamphetamine (ADDERALL) 20 MG tablet Take 1 tablet (20 mg total) by mouth 2 (two) times daily. 08/29/19 08/28/20 Yes Myrlene Broker, MD  cetirizine (ZYRTEC ALLERGY) 10 MG tablet Take 1  tablet (10 mg total) by mouth daily. Patient taking differently: Take 10 mg by mouth daily as needed for allergies.  08/31/19  Yes Avegno, Zachery Dakins, FNP  clonazePAM (KLONOPIN) 1 MG tablet TAKE 1 TABLET(1 MG) BY MOUTH TWICE DAILY AS NEEDED FOR ANXIETY Patient taking differently: Take 1 mg by mouth 2 (two) times daily as needed for anxiety.  08/29/19  Yes Myrlene Broker, MD  escitalopram (LEXAPRO) 20 MG tablet Take 1 tablet (20 mg total) by mouth daily. 08/29/19 08/28/20 Yes Myrlene Broker, MD  ibuprofen (ADVIL) 200 MG tablet Take 600 mg by mouth every 6 (six) hours as needed for headache or moderate pain.   Yes [provider]  meloxicam (MOBIC) 15 MG tablet Take 1 tablet (15 mg total) by mouth daily. 12/05/19  Yes Wurst, [place], PA-C  NUVARING 0.12-0.015 MG/24HR vaginal ring INSERT VAGINALLY AND LEAVE IN. PLACE FOR 3 CONSECUTIVE WEEKS, THEN REMOVE FOR 1 WEEK Patient taking differently: Place 1 each vaginally See admin instructions. PLACE FOR 3 CONSECUTIVE WEEKS, THEN REMOVE FOR 1 WEEK 11/28/15  Yes Constant, Peggy, MD  predniSONE (DELTASONE) 10 MG tablet Take 2 tablets (20 mg total) by mouth daily. Patient not taking: Reported on 11/02/2019 08/31/19   Durward Parcel, FNP    Allergies as of 11/02/2019 - Review Complete 11/02/2019  Allergen Reaction Noted  . Bactrim Hives and Itching 07/28/2010  . Ciprofloxacin Hives and Itching 10/02/2011  . Other Other (See Comments) 09/30/2011  . Penicillins Hives and  Itching   . Percocet [oxycodone-acetaminophen] Other (See Comments) 10/07/2011  . Vancomycin Itching 08/05/2017    Family History  Problem Relation Age of Onset  . Diabetes Mother   . Hypertension Mother   . Hyperlipidemia Mother   . Neuropathy Mother   . Diabetes Maternal Grandmother   . Hypertension Maternal Grandmother   . Heart disease Maternal Grandmother        CHF  . COPD Maternal Grandmother   . Cirrhosis Maternal Grandmother   . Anxiety disorder Maternal  Grandmother   . Cancer Maternal Grandfather        lung  . Diabetes Paternal Grandmother   . Diabetes Paternal Grandfather   . Aneurysm Paternal Grandfather   . Anxiety disorder Paternal Grandfather   . Drug abuse Father   . ADD / ADHD Brother   . Stroke Other   . Anxiety disorder Maternal Uncle   . Anxiety disorder Paternal Uncle   . Depression Paternal Uncle   . Anxiety disorder Maternal Aunt     Social History   Socioeconomic History  . Marital status: Single    Spouse name: Not on file  . Number of children: Not on file  . Years of education: Not on file  . Highest education level: Not on file  Occupational History  . Not on file  Tobacco Use  . Smoking status: Current Every Day Smoker    Packs/day: 0.25    Years: 7.00    Pack years: 1.75    Types: Cigarettes  . Smokeless tobacco: Never Used  Vaping Use  . Vaping Use: Never used  Substance and Sexual Activity  . Alcohol use: Yes    Comment: social use   . Drug use: No  . Sexual activity: Yes    Birth control/protection: None, Implant  Other Topics Concern  . Not on file  Social History Narrative  . Not on file   Social Determinants of Health   Financial Resource Strain:   . Difficulty of Paying Living Expenses: Not on file  Food Insecurity:   . Worried About Programme researcher, broadcasting/film/video in the Last Year: Not on file  . Ran Out of Food in the Last Year: Not on file  Transportation Needs:   . Lack of Transportation (Medical): Not on file  . Lack of Transportation (Non-Medical): Not on file  Physical Activity:   . Days of Exercise per Week: Not on file  . Minutes of Exercise per Session: Not on file  Stress:   . Feeling of Stress : Not on file  Social Connections:   . Frequency of Communication with Friends and Family: Not on file  . Frequency of Social Gatherings with Friends and Family: Not on file  . Attends Religious Services: Not on file  . Active Member of Clubs or Organizations: Not on file  . Attends  Banker Meetings: Not on file  . Marital Status: Not on file  Intimate Partner Violence:   . Fear of Current or Ex-Partner: Not on file  . Emotionally Abused: Not on file  . Physically Abused: Not on file  . Sexually Abused: Not on file    Review of Systems: See HPI, otherwise negative ROS  Impression/Plan: [place] is here for an EGD with possible dilation due to history of dysphagia  The risks of the procedure including infection, bleed, or perforation as well as benefits, limitations, alternatives and imponderables have been reviewed with the patient. Questions have been answered.  All parties agreeable.

## 2019-12-19 NOTE — Transfer of Care (Signed)
Immediate Anesthesia Transfer of Care Note  Patient: Kathleen Velasquez  Procedure(s) Performed: ESOPHAGOGASTRODUODENOSCOPY (EGD) WITH PROPOFOL (N/A ) BALLOON DILATION (N/A ) BIOPSY  Patient Location: PACU  Anesthesia Type:General  Level of Consciousness: drowsy  Airway & Oxygen Therapy: Patient Spontanous Breathing  Post-op Assessment: Report given to RN and Post -op Vital signs reviewed and stable  Post vital signs: Reviewed and stable  Last Vitals:  Vitals Value Taken Time  BP    Temp    Pulse 70 12/19/19 1202  Resp 21 12/19/19 1202  SpO2 98 % 12/19/19 1202  Vitals shown include unvalidated device data.  Last Pain:  Vitals:   12/19/19 1120  TempSrc: Oral  PainSc: 0-No pain      Patients Stated Pain Goal: 7 (12/19/19 1120)  Complications: No complications documented.

## 2019-12-20 ENCOUNTER — Other Ambulatory Visit: Payer: Self-pay

## 2019-12-20 LAB — SURGICAL PATHOLOGY

## 2019-12-21 ENCOUNTER — Encounter (HOSPITAL_COMMUNITY): Payer: Self-pay | Admitting: Psychiatry

## 2019-12-21 ENCOUNTER — Other Ambulatory Visit: Payer: Self-pay

## 2019-12-21 ENCOUNTER — Telehealth (INDEPENDENT_AMBULATORY_CARE_PROVIDER_SITE_OTHER): Payer: 59 | Admitting: Psychiatry

## 2019-12-21 DIAGNOSIS — F411 Generalized anxiety disorder: Secondary | ICD-10-CM

## 2019-12-21 DIAGNOSIS — F900 Attention-deficit hyperactivity disorder, predominantly inattentive type: Secondary | ICD-10-CM

## 2019-12-21 MED ORDER — AMPHETAMINE-DEXTROAMPHETAMINE 20 MG PO TABS: 20.0000 mg | ORAL_TABLET | Freq: Two times a day (BID) | ORAL | 0 refills | Status: DC

## 2019-12-21 MED ORDER — ESCITALOPRAM OXALATE 20 MG PO TABS: 20.0000 mg | ORAL_TABLET | Freq: Every day | ORAL | 2 refills | Status: DC

## 2019-12-21 MED ORDER — CLONAZEPAM 1 MG PO TABS
ORAL_TABLET | ORAL | 2 refills | Status: DC
Start: 2019-12-21 — End: 2020-03-20

## 2019-12-21 NOTE — Progress Notes (Signed)
Virtual Visit via Video Note  I connected with Kathleen Velasquez on 12/21/19 at 10:00 AM EST by a video enabled telemedicine application and verified that I am speaking with the correct person using two identifiers.  Location: Patient: home Provider: home   I discussed the limitations of evaluation and management by telemedicine and the availability of in person appointments. The patient expressed understanding and agreed to proceed.    I discussed the assessment and treatment plan with the patient. The patient was provided an opportunity to ask questions and all were answered. The patient agreed with the plan and demonstrated an understanding of the instructions.   The patient was advised to call back or seek an in-person evaluation if the symptoms worsen or if the condition fails to improve as anticipated.  I provided 15 minutes of non-face-to-face time during this encounter.   [name], MD  [facility] MD/PA/NP OP Progress Note  12/21/2019 10:25 AM Kathleen Velasquez  MRN:  [id number]  Chief Complaint:  Chief Complaint    ADHD; Anxiety; Depression     HPI: This patient is a 29 year old single white female who lives with her niece..  She often has her young niece staying with her.  She is working as a Occupational hygienist at [employer].  The patient returns for follow-up after 3 months.  She is still enjoying her job and states that things are going well.  Her mood has been stable and she denies significant depression or anxiety.  She is still taking care of her needs and plans to try to gain custody.  She is sleeping well.  She is staying well focused with the Adderall.   Visit Diagnosis:    ICD-10-CM   1. Generalized anxiety disorder  F41.1   2. Attention deficit hyperactivity disorder (ADHD), predominantly inattentive type  F90.0     Past Psychiatric History: none  Past Medical History:  Past Medical History:  Diagnosis Date  . Abscess    right buttocks/thigh  .  Anxiety   . Depression   . DUB (dysfunctional uterine bleeding) 05/25/2012  . Hydradenitis   . Hypothyroidism   . Polycystic ovaries   . Thyroid disease    hypothyroidism    Past Surgical History:  Procedure Laterality Date  . ADENOIDECTOMY    . CHOLECYSTECTOMY  01/27/2012   Procedure: LAPAROSCOPIC CHOLECYSTECTOMY;  Surgeon: Fabio Bering, MD;  Location: [facility];  Service: General;  Laterality: N/A;  . HERNIA REPAIR Left    LIH  . HYDRADENITIS EXCISION  10/02/2011   Procedure: EXCISION HYDRADENITIS AXILLA;  Surgeon: Fabio Bering, MD;  Location: [facility];  Service: General;  Laterality: Right;  Right Axillary Dissection  . HYDRADENITIS EXCISION Left 10/19/2013   Procedure: INCISION OF HIDRADENITIS SUPPURATIVA LEFT AXILLA;  Surgeon: Marlane Hatcher, MD;  Location: [facility];  Service: General;  Laterality: Left;  . HYDRADENITIS EXCISION Left 09/09/2018   Procedure: EXCISION HIDRADENITIS AXILLA, LEFT;  Surgeon: Franky Macho, MD;  Location: [facility];  Service: General;  Laterality: Left;  . TONSILLECTOMY      Family Psychiatric History: see below  Family History:  Family History  Problem Relation Age of Onset  . Diabetes Mother   . Hypertension Mother   . Hyperlipidemia Mother   . Neuropathy Mother   . Diabetes Maternal Grandmother   . Hypertension Maternal Grandmother   . Heart disease Maternal Grandmother        CHF  . COPD Maternal Grandmother   .  Cirrhosis Maternal Grandmother   . Anxiety disorder Maternal Grandmother   . Cancer Maternal Grandfather        lung  . Diabetes Paternal Grandmother   . Diabetes Paternal Grandfather   . Aneurysm Paternal Grandfather   . Anxiety disorder Paternal Grandfather   . Drug abuse Father   . ADD / ADHD Brother   . Stroke Other   . Anxiety disorder Maternal Uncle   . Anxiety disorder Paternal Uncle   . Depression Paternal Uncle   . Anxiety disorder Maternal Aunt     Social History:  Social History   Socioeconomic History  .  Marital status: Single    Spouse name: Not on file  . Number of children: Not on file  . Years of education: Not on file  . Highest education level: Not on file  Occupational History  . Not on file  Tobacco Use  . Smoking status: Current Every Day Smoker    Packs/day: 0.25    Years: 7.00    Pack years: 1.75    Types: Cigarettes  . Smokeless tobacco: Never Used  Vaping Use  . Vaping Use: Never used  Substance and Sexual Activity  . Alcohol use: Yes    Comment: social use   . Drug use: No  . Sexual activity: Yes    Birth control/protection: None, Implant  Other Topics Concern  . Not on file  Social History Narrative  . Not on file   Social Determinants of Health   Financial Resource Strain: Not on file  Food Insecurity: Not on file  Transportation Needs: Not on file  Physical Activity: Not on file  Stress: Not on file  Social Connections: Not on file    Allergies:  Allergies  Allergen Reactions  . Bactrim Hives and Itching  . Ciprofloxacin Hives and Itching    CRNA Discontinued preop antibiotic  IVPB  Cipro in OR after developed itching and hives left arm.  Received Benadryl with relief noted. Dr.Gonzalez and Dr. Leticia Penna informed. DDallasRN  . Other Other (See Comments)    [place] causes a hives, swelling, itching, and anaphylaxis.   Marland Kitchen Penicillins Hives and Itching    Has patient had a PCN reaction causing immediate rash, facial/tongue/throat swelling, SOB or lightheadedness with hypotension: Yes Has patient had a PCN reaction causing severe rash involving mucus membranes or skin necrosis: No Has patient had a PCN reaction that required hospitalization No Has patient had a PCN reaction occurring within the last 10 years: No If all of the above answers are "NO", then may proceed with Cephalosporin use.   Marland Kitchen Percocet [Oxycodone-Acetaminophen] Other (See Comments)    Face flushes  . Vancomycin Itching    Metabolic Disorder Labs: Lab Results  Component Value Date    HGBA1C 5.7 (H) 05/25/2012   MPG 117 (H) 05/25/2012   No results found for: PROLACTIN No results found for: CHOL, TRIG, HDL, CHOLHDL, VLDL, LDLCALC Lab Results  Component Value Date   TSH 2.515 05/25/2012    Therapeutic Level Labs: No results found for: LITHIUM No results found for: VALPROATE No components found for:  CBMZ  Current Medications: Current Outpatient Medications  Medication Sig Dispense Refill  . acetaminophen (TYLENOL) 325 MG tablet Take 650 mg by mouth every 6 (six) hours as needed for moderate pain or headache.    . amphetamine-dextroamphetamine (ADDERALL) 20 MG tablet Take 1 tablet (20 mg total) by mouth 2 (two) times daily. 60 tablet 0  . amphetamine-dextroamphetamine (ADDERALL) 20 MG  tablet Take 1 tablet (20 mg total) by mouth 2 (two) times daily. 60 tablet 0  . amphetamine-dextroamphetamine (ADDERALL) 20 MG tablet Take 1 tablet (20 mg total) by mouth 2 (two) times daily. 90 tablet 0  . cetirizine (ZYRTEC ALLERGY) 10 MG tablet Take 1 tablet (10 mg total) by mouth daily. (Patient taking differently: Take 10 mg by mouth daily as needed for allergies. ) 30 tablet 0  . clonazePAM (KLONOPIN) 1 MG tablet TAKE 1 TABLET(1 MG) BY MOUTH TWICE DAILY AS NEEDED FOR ANXIETY 60 tablet 2  . escitalopram (LEXAPRO) 20 MG tablet Take 1 tablet (20 mg total) by mouth daily. 90 tablet 2  . ibuprofen (ADVIL) 200 MG tablet Take 600 mg by mouth every 6 (six) hours as needed for headache or moderate pain.    . meloxicam (MOBIC) 15 MG tablet Take 1 tablet (15 mg total) by mouth daily. 30 tablet 0  . NUVARING 0.12-0.015 MG/24HR vaginal ring INSERT VAGINALLY AND LEAVE IN. PLACE FOR 3 CONSECUTIVE WEEKS, THEN REMOVE FOR 1 WEEK (Patient taking differently: Place 1 each vaginally See admin instructions. PLACE FOR 3 CONSECUTIVE WEEKS, THEN REMOVE FOR 1 WEEK) 1 each 10  . omeprazole (PRILOSEC) 20 MG capsule Take 1 capsule (20 mg total) by mouth 2 (two) times daily. 60 capsule 5   No current  facility-administered medications for this visit.     Musculoskeletal: Strength & Muscle Tone: within normal limits Gait & Station: normal Patient leans: N/A  Psychiatric Specialty Exam: Review of Systems  All other systems reviewed and are negative.   There were no vitals taken for this visit.There is no height or weight on file to calculate BMI.  General Appearance: Casual and Fairly Groomed  Eye Contact:  Good  Speech:  Clear and Coherent  Volume:  Normal  Mood:  Euthymic  Affect:  Appropriate and Congruent  Thought Process:  Goal Directed  Orientation:  Full (Time, Place, and Person)  Thought Content: Rumination   Suicidal Thoughts:  No  Homicidal Thoughts:  No  Memory:  Immediate;   Good Recent;   Good Remote;   Good  Judgement:  Good  Insight:  Good  Psychomotor Activity:  Normal  Concentration:  Concentration: Good and Attention Span: Good  Recall:  Good  Fund of Knowledge: Good  Language: Good  Akathisia:  No  Handed:  Right  AIMS (if indicated): not done  Assets:  Communication Skills Desire for Improvement Physical Health Resilience Social Support Talents/Skills  ADL's:  Intact  Cognition: WNL  Sleep:  Good   Screenings:   Assessment and Plan: This patient is a 29 year old female with a history of depression anxiety and ADHD.  She continues to do well on her current regimen.  She will continue Adderall 20 mg twice daily for ADHD, Lexapro 20 mg daily for depression anxiety and clonazepam 1 mg twice daily for anxiety.  She will return to see me in 3 months   [name], MD 12/21/2019, 10:25 AM

## 2019-12-25 ENCOUNTER — Encounter (HOSPITAL_COMMUNITY): Payer: Self-pay | Admitting: Internal Medicine

## 2019-12-25 ENCOUNTER — Ambulatory Visit
Admission: EM | Admit: 2019-12-25 | Discharge: 2019-12-25 | Disposition: A | Discharge status: Home / Self Care | Payer: 59 | Attending: Emergency Medicine | Admitting: Emergency Medicine

## 2019-12-25 ENCOUNTER — Other Ambulatory Visit: Payer: Self-pay

## 2019-12-25 DIAGNOSIS — Z1152 Encounter for screening for COVID-19: Secondary | ICD-10-CM

## 2019-12-25 DIAGNOSIS — R110 Nausea: Secondary | ICD-10-CM

## 2019-12-25 DIAGNOSIS — R52 Pain, unspecified: Secondary | ICD-10-CM | POA: Diagnosis not present

## 2019-12-25 DIAGNOSIS — J069 Acute upper respiratory infection, unspecified: Secondary | ICD-10-CM

## 2019-12-25 MED ORDER — FLUTICASONE PROPIONATE 50 MCG/ACT NA SUSP: 1.0000 | Freq: Every day | NASAL | 0 refills | Status: DC

## 2019-12-25 MED ORDER — ONDANSETRON 4 MG PO TBDP: 4.0000 mg | ORAL_TABLET | Freq: Three times a day (TID) | ORAL | 0 refills | Status: DC | PRN

## 2019-12-25 MED ORDER — BENZONATATE 100 MG PO CAPS: 100.0000 mg | ORAL_CAPSULE | Freq: Three times a day (TID) | ORAL | 0 refills | Status: DC | PRN

## 2019-12-25 MED ORDER — PREDNISONE 10 MG PO TABS: 20.0000 mg | ORAL_TABLET | Freq: Every day | ORAL | 0 refills | Status: DC

## 2019-12-25 NOTE — Discharge Instructions (Signed)
COVID testing ordered.  It will take between 2-7 days for test results.  Someone will contact you regarding abnormal results.    In the meantime: You should remain isolated in your home for 10 days from symptom onset AND greater than 24 hours after symptoms resolution (absence of fever without the use of fever-reducing medication and improvement in respiratory symptoms), whichever is longer Get plenty of rest and push fluids Tessalon Perles (Benzonate) prescribed for cough/no more than 6 tablets in 24 hours Zofran prescribed for nausea Flonase for nasal congestion and runny nose Prednisone was prescribed Use medications daily for symptom relief Use OTC medications like ibuprofen or tylenol as needed fever or pain Call or go to the ED if you have any new or worsening symptoms such as fever, worsening cough, shortness of breath, chest tightness, chest pain, turning blue, changes in mental status, etc.

## 2019-12-25 NOTE — ED Triage Notes (Signed)
Pt presents with c/o cough  A, body aches and diarrhea since wenesday

## 2019-12-25 NOTE — ED Provider Notes (Signed)
[facility]   [id number] 12/25/19 Arrival Time: 1253   Chief Complaint  Patient presents with   Cough   Generalized Body Aches   Diarrhea    SUBJECTIVE: History from: patient.  Kathleen Velasquez is a 29 y.o. female who presents to the urgent care with a complaint of cough, nasal congestion, body ache and diarrhea for the past 5 days and nausea this morning.  Denies sick exposure to COVID, flu or strep.  Denies recent travel.  Has tried OTC medication without relief.  Denies alleviating or aggravating factors.  Denies previous symptoms in the past.   Denies fever, chills, fatigue, sinus pain, rhinorrhea, sore throat, SOB, wheezing, chest pain, nausea, changes in bowel or bladder habits.    ROS: As per HPI.  All other pertinent ROS negative.      Past Medical History:  Diagnosis Date   Abscess    right buttocks/thigh   Anxiety    Depression    DUB (dysfunctional uterine bleeding) 05/25/2012   Hydradenitis    Hypothyroidism    Polycystic ovaries    Thyroid disease    hypothyroidism   Past Surgical History:  Procedure Laterality Date   ADENOIDECTOMY     BALLOON DILATION N/A 12/19/2019   Procedure: BALLOON DILATION;  Surgeon: Lanelle Bal, DO;  [facility]: [facility];  Service: Endoscopy;  Laterality: N/A;   BIOPSY  12/19/2019   Procedure: BIOPSY;  Surgeon: Lanelle Bal, DO;  Location: [facility];  Service: Endoscopy;;   CHOLECYSTECTOMY  01/27/2012   Procedure: LAPAROSCOPIC CHOLECYSTECTOMY;  Surgeon: Fabio Bering, MD;  Location: [facility];  Service: General;  Laterality: N/A;   ESOPHAGOGASTRODUODENOSCOPY (EGD) WITH PROPOFOL N/A 12/19/2019   Procedure: ESOPHAGOGASTRODUODENOSCOPY (EGD) WITH PROPOFOL;  Surgeon: Lanelle Bal, DO;  [facility]: [facility];  Service: Endoscopy;  Laterality: N/A;  3:00pm   HERNIA REPAIR Left    LIH   HYDRADENITIS EXCISION  10/02/2011   Procedure: EXCISION HYDRADENITIS AXILLA;  Surgeon: Fabio Bering, MD;   Location: [facility];  Service: General;  Laterality: Right;  Right Axillary Dissection   HYDRADENITIS EXCISION Left 10/19/2013   Procedure: INCISION OF HIDRADENITIS SUPPURATIVA LEFT AXILLA;  Surgeon: Marlane Hatcher, MD;  Location: [facility];  Service: General;  Laterality: Left;   HYDRADENITIS EXCISION Left 09/09/2018   Procedure: EXCISION HIDRADENITIS AXILLA, LEFT;  Surgeon: Franky Macho, MD;  Location: [facility];  Service: General;  Laterality: Left;   TONSILLECTOMY     Allergies  Allergen Reactions   Bactrim Hives and Itching   Ciprofloxacin Hives and Itching    CRNA Discontinued preop antibiotic  IVPB  Cipro in OR after developed itching and hives left arm.  Received Benadryl with relief noted. Dr.Gonzalez and Dr. Leticia Penna informed. DDallasRN   Other Other (See Comments)    [place] causes a hives, swelling, itching, and anaphylaxis.    Penicillins Hives and Itching    Has patient had a PCN reaction causing immediate rash, facial/tongue/throat swelling, SOB or lightheadedness with hypotension: Yes Has patient had a PCN reaction causing severe rash involving mucus membranes or skin necrosis: No Has patient had a PCN reaction that required hospitalization No Has patient had a PCN reaction occurring within the last 10 years: No If all of the above answers are "NO", then may proceed with Cephalosporin use.    Percocet [Oxycodone-Acetaminophen] Other (See Comments)    Face flushes   Vancomycin Itching   No current facility-administered medications on file prior to encounter.  Current Outpatient Medications on File Prior to Encounter  Medication Sig Dispense Refill   acetaminophen (TYLENOL) 325 MG tablet Take 650 mg by mouth every 6 (six) hours as needed for moderate pain or headache.     amphetamine-dextroamphetamine (ADDERALL) 20 MG tablet Take 1 tablet (20 mg total) by mouth 2 (two) times daily. 60 tablet 0   amphetamine-dextroamphetamine (ADDERALL) 20 MG tablet Take 1 tablet (20  mg total) by mouth 2 (two) times daily. 60 tablet 0   amphetamine-dextroamphetamine (ADDERALL) 20 MG tablet Take 1 tablet (20 mg total) by mouth 2 (two) times daily. 90 tablet 0   cetirizine (ZYRTEC ALLERGY) 10 MG tablet Take 1 tablet (10 mg total) by mouth daily. (Patient taking differently: Take 10 mg by mouth daily as needed for allergies. ) 30 tablet 0   clonazePAM (KLONOPIN) 1 MG tablet TAKE 1 TABLET(1 MG) BY MOUTH TWICE DAILY AS NEEDED FOR ANXIETY 60 tablet 2   escitalopram (LEXAPRO) 20 MG tablet Take 1 tablet (20 mg total) by mouth daily. 90 tablet 2   ibuprofen (ADVIL) 200 MG tablet Take 600 mg by mouth every 6 (six) hours as needed for headache or moderate pain.     meloxicam (MOBIC) 15 MG tablet Take 1 tablet (15 mg total) by mouth daily. 30 tablet 0   NUVARING 0.12-0.015 MG/24HR vaginal ring INSERT VAGINALLY AND LEAVE IN. PLACE FOR 3 CONSECUTIVE WEEKS, THEN REMOVE FOR 1 WEEK (Patient taking differently: Place 1 each vaginally See admin instructions. PLACE FOR 3 CONSECUTIVE WEEKS, THEN REMOVE FOR 1 WEEK) 1 each 10   omeprazole (PRILOSEC) 20 MG capsule Take 1 capsule (20 mg total) by mouth 2 (two) times daily. 60 capsule 5   Social History   Socioeconomic History   Marital status: Single    Spouse name: Not on file   Number of children: Not on file   Years of education: Not on file   Highest education level: Not on file  Occupational History   Not on file  Tobacco Use   Smoking status: Current Every Day Smoker    Packs/day: 0.25    Years: 7.00    Pack years: 1.75    Types: Cigarettes   Smokeless tobacco: Never Used  Vaping Use   Vaping Use: Never used  Substance and Sexual Activity   Alcohol use: Yes    Comment: social use    Drug use: No   Sexual activity: Yes    Birth control/protection: None, Implant  Other Topics Concern   Not on file  Social History Narrative   Not on file   Social Determinants of Health   Financial Resource Strain: Not on  file  Food Insecurity: Not on file  Transportation Needs: Not on file  Physical Activity: Not on file  Stress: Not on file  Social Connections: Not on file  Intimate Partner Violence: Not on file   Family History  Problem Relation Age of Onset   Diabetes Mother    Hypertension Mother    Hyperlipidemia Mother    Neuropathy Mother    Diabetes Maternal Grandmother    Hypertension Maternal Grandmother    Heart disease Maternal Grandmother        CHF   COPD Maternal Grandmother    Cirrhosis Maternal Grandmother    Anxiety disorder Maternal Grandmother    Cancer Maternal Grandfather        lung   Diabetes Paternal Grandmother    Diabetes Paternal Grandfather    Aneurysm Paternal Grandfather  Anxiety disorder Paternal Grandfather    Drug abuse Father    ADD / ADHD Brother    Stroke Other    Anxiety disorder Maternal Uncle    Anxiety disorder Paternal Uncle    Depression Paternal Uncle    Anxiety disorder Maternal Aunt     OBJECTIVE:  Vitals:   12/25/19 1336  BP: 138/85  Pulse: 78  Resp: 18  Temp: 99 F (37.2 C)  SpO2: 95%     General appearance: alert; appears fatigued, but nontoxic; speaking in full sentences and tolerating own secretions HEENT: NCAT; Ears: EACs clear, TMs pearly gray; Eyes: PERRL.  EOM grossly intact. Sinuses: nontender; Nose: nares patent without rhinorrhea, Throat: oropharynx clear, tonsils non erythematous or enlarged, uvula midline  Neck: supple without LAD Lungs: unlabored respirations, symmetrical air entry; cough: mild; no respiratory distress; CTAB Heart: regular rate and rhythm.  Radial pulses 2+ symmetrical bilaterally Skin: warm and dry Psychological: alert and cooperative; normal mood and affect  LABS:  No results found for this or any previous visit (from the past 24 hour(s)).   ASSESSMENT & PLAN:  1. Encounter for screening for COVID-19   2. Viral URI with cough   3. Body aches   4. Nausea without  vomiting     Meds ordered this encounter  Medications   fluticasone (FLONASE) 50 MCG/ACT nasal spray    Sig: Place 1 spray into both nostrils daily for 14 days.    Dispense:  16 g    Refill:  0   ondansetron (ZOFRAN ODT) 4 MG disintegrating tablet    Sig: Take 1 tablet (4 mg total) by mouth every 8 (eight) hours as needed for nausea or vomiting.    Dispense:  20 tablet    Refill:  0   benzonatate (TESSALON) 100 MG capsule    Sig: Take 1 capsule (100 mg total) by mouth 3 (three) times daily as needed for cough.    Dispense:  30 capsule    Refill:  0   predniSONE (DELTASONE) 10 MG tablet    Sig: Take 2 tablets (20 mg total) by mouth daily.    Dispense:  15 tablet    Refill:  0    Discharge instructions  COVID testing ordered.  It will take between 2-7 days for test results.  Someone will contact you regarding abnormal results.    In the meantime: You should remain isolated in your home for 10 days from symptom onset AND greater than 24 hours after symptoms resolution (absence of fever without the use of fever-reducing medication and improvement in respiratory symptoms), whichever is longer Get plenty of rest and push fluids Tessalon Perles (Benzonate) prescribed for cough/no more than 6 tablets in 24 hours Zofran prescribed for nausea Flonase for nasal congestion and runny nose Prednisone was prescribed Use medications daily for symptom relief Use OTC medications like ibuprofen or tylenol as needed fever or pain Call or go to the ED if you have any new or worsening symptoms such as fever, worsening cough, shortness of breath, chest tightness, chest pain, turning blue, changes in mental status, etc...   Reviewed expectations re: course of current medical issues. Questions answered. Outlined signs and symptoms indicating need for more acute intervention. Patient verbalized understanding. After Visit Summary given.         Durward Parcel, FNP 12/25/19 1420

## 2019-12-27 LAB — COVID-19, FLU A+B NAA
Influenza A, NAA: NOT DETECTED
Influenza B, NAA: NOT DETECTED
SARS-CoV-2, NAA: NOT DETECTED

## 2020-02-07 ENCOUNTER — Encounter (HOSPITAL_COMMUNITY): Payer: Self-pay | Admitting: Emergency Medicine

## 2020-02-07 ENCOUNTER — Other Ambulatory Visit: Payer: Self-pay

## 2020-02-07 ENCOUNTER — Emergency Department (HOSPITAL_COMMUNITY)
Admission: EM | Admit: 2020-02-07 | Discharge: 2020-02-07 | Disposition: A | Discharge status: Home / Self Care | Payer: 59 | Attending: Emergency Medicine | Admitting: Emergency Medicine

## 2020-02-07 DIAGNOSIS — Z5321 Procedure and treatment not carried out due to patient leaving prior to being seen by health care provider: Secondary | ICD-10-CM | POA: Diagnosis not present

## 2020-02-07 DIAGNOSIS — L732 Hidradenitis suppurativa: Secondary | ICD-10-CM | POA: Insufficient documentation

## 2020-02-07 NOTE — ED Triage Notes (Signed)
Pt has a skin disorder called Hidradenitis suppurativa and she is having a flare up that began Friday.  She started the antibiotics on Friday but is in so much pain she is unable to manage it at this time.

## 2020-02-27 ENCOUNTER — Other Ambulatory Visit: Payer: Self-pay

## 2020-02-27 ENCOUNTER — Encounter (HOSPITAL_COMMUNITY): Payer: Self-pay | Admitting: *Deleted

## 2020-02-27 ENCOUNTER — Emergency Department (HOSPITAL_COMMUNITY)
Admission: EM | Admit: 2020-02-27 | Discharge: 2020-02-27 | Disposition: A | Discharge status: Home / Self Care | Payer: 59 | Attending: Emergency Medicine | Admitting: Emergency Medicine

## 2020-02-27 DIAGNOSIS — E039 Hypothyroidism, unspecified: Secondary | ICD-10-CM | POA: Diagnosis not present

## 2020-02-27 DIAGNOSIS — M791 Myalgia, unspecified site: Secondary | ICD-10-CM | POA: Diagnosis not present

## 2020-02-27 DIAGNOSIS — F17210 Nicotine dependence, cigarettes, uncomplicated: Secondary | ICD-10-CM | POA: Insufficient documentation

## 2020-02-27 DIAGNOSIS — R509 Fever, unspecified: Secondary | ICD-10-CM | POA: Diagnosis not present

## 2020-02-27 LAB — BASIC METABOLIC PANEL WITH GFR
Anion gap: 8 (ref 5–15)
BUN: 9 mg/dL (ref 6–20)
CO2: 20 mmol/L — ABNORMAL LOW (ref 22–32)
Calcium: 9 mg/dL (ref 8.9–10.3)
Chloride: 104 mmol/L (ref 98–111)
Creatinine, Ser: 0.69 mg/dL (ref 0.44–1.00)
GFR, Estimated: >60 mL/min
Glucose, Bld: 104 mg/dL — ABNORMAL HIGH (ref 70–99)
Potassium: 3.5 mmol/L (ref 3.5–5.1)
Sodium: 132 mmol/L — ABNORMAL LOW (ref 135–145)

## 2020-02-27 LAB — CBC WITH DIFFERENTIAL/PLATELET
Abs Immature Granulocytes: 0.04 K/uL (ref 0.00–0.07)
Basophils Absolute: 0.1 K/uL (ref 0.0–0.1)
Basophils Relative: 1 %
Eosinophils Absolute: 0.1 K/uL (ref 0.0–0.5)
Eosinophils Relative: 1 %
HCT: 43.5 % (ref 36.0–46.0)
Hemoglobin: 14.1 g/dL (ref 12.0–15.0)
Immature Granulocytes: 0 %
Lymphocytes Relative: 18 %
Lymphs Abs: 1.9 K/uL (ref 0.7–4.0)
MCH: 30 pg (ref 26.0–34.0)
MCHC: 32.4 g/dL (ref 30.0–36.0)
MCV: 92.6 fL (ref 80.0–100.0)
Monocytes Absolute: 0.5 K/uL (ref 0.1–1.0)
Monocytes Relative: 5 %
Neutro Abs: 8.3 K/uL — ABNORMAL HIGH (ref 1.7–7.7)
Neutrophils Relative %: 75 %
Platelets: 300 K/uL (ref 150–400)
RBC: 4.7 MIL/uL (ref 3.87–5.11)
RDW: 13.5 % (ref 11.5–15.5)
WBC: 10.9 K/uL — ABNORMAL HIGH (ref 4.0–10.5)
nRBC: 0 % (ref 0.0–0.2)

## 2020-02-27 MED ORDER — IBUPROFEN 800 MG PO TABS
800.0000 mg | ORAL_TABLET | Freq: Once | ORAL | Status: AC
  Administered 2020-02-27: 800 mg via ORAL
  Filled 2020-02-27: qty 1

## 2020-02-27 MED ORDER — ACETAMINOPHEN 325 MG PO TABS
650.0000 mg | ORAL_TABLET | Freq: Once | ORAL | Status: AC
  Administered 2020-02-27: 650 mg via ORAL
  Filled 2020-02-27: qty 2

## 2020-02-27 NOTE — ED Provider Notes (Signed)
[facility] EMERGENCY DEPARTMENT Provider Note   CSN: [id number] Arrival date & time: 02/27/20  1618     History Chief Complaint  Patient presents with  . Fever    Kathleen Velasquez is a 30 y.o. female.  Patient got her second Covid shot yesterday and now has developed fevers and aches,  No cough no vomitin  The history is provided by the patient.  Fever Temp source:  Oral Severity:  Moderate Onset quality:  Sudden Timing:  Constant Progression:  Worsening Chronicity:  New Relieved by:  Nothing Worsened by:  Nothing Ineffective treatments:  None tried Associated symptoms: no chest pain, no congestion, no cough, no diarrhea, no headaches and no rash        Past Medical History:  Diagnosis Date  . Abscess    right buttocks/thigh  . Anxiety   . Depression   . DUB (dysfunctional uterine bleeding) 05/25/2012  . Hydradenitis   . Hypothyroidism   . Polycystic ovaries   . Thyroid disease    hypothyroidism    Patient Active Problem List   Diagnosis Date Noted  . Left axillary hidradenitis   . Anovulatory (dysfunctional uterine) bleeding 06/07/2012  . DUB (dysfunctional uterine bleeding) 05/25/2012  . FOOT PAIN, CHRONIC 05/09/2007    Past Surgical History:  Procedure Laterality Date  . ADENOIDECTOMY    . BALLOON DILATION N/A 12/19/2019   Procedure: BALLOON DILATION;  Surgeon: Lanelle Bal, DO;  [facility];  Service: Endoscopy;  Laterality: N/A;  . BIOPSY  12/19/2019   Procedure: BIOPSY;  Surgeon: Lanelle Bal, DO;  [facility];  Service: Endoscopy;;  . CHOLECYSTECTOMY  01/27/2012   Procedure: LAPAROSCOPIC CHOLECYSTECTOMY;  Surgeon: Fabio Bering, MD;  Location: [facility];  Service: General;  Laterality: N/A;  . ESOPHAGOGASTRODUODENOSCOPY (EGD) WITH PROPOFOL N/A 12/19/2019   Procedure: ESOPHAGOGASTRODUODENOSCOPY (EGD) WITH PROPOFOL;  Surgeon: Lanelle Bal, DO;  [facility];  Service: Endoscopy;  Laterality: N/A;   3:00pm  . HERNIA REPAIR Left    LIH  . HYDRADENITIS EXCISION  10/02/2011   Procedure: EXCISION HYDRADENITIS AXILLA;  Surgeon: Fabio Bering, MD;  Location: [facility];  Service: General;  Laterality: Right;  Right Axillary Dissection  . HYDRADENITIS EXCISION Left 10/19/2013   Procedure: INCISION OF HIDRADENITIS SUPPURATIVA LEFT AXILLA;  Surgeon: Marlane Hatcher, MD;  Location: [facility];  Service: General;  Laterality: Left;  . HYDRADENITIS EXCISION Left 09/09/2018   Procedure: EXCISION HIDRADENITIS AXILLA, LEFT;  Surgeon: Franky Macho, MD;  Location: [facility];  Service: General;  Laterality: Left;  . TONSILLECTOMY       OB History    Gravida  0   Para      Term      Preterm      AB      Living  0     SAB      IAB      Ectopic      Multiple      Live Births              Family History  Problem Relation Age of Onset  . Diabetes Mother   . Hypertension Mother   . Hyperlipidemia Mother   . Neuropathy Mother   . Diabetes Maternal Grandmother   . Hypertension Maternal Grandmother   . Heart disease Maternal Grandmother        CHF  . COPD Maternal Grandmother   . Cirrhosis Maternal Grandmother   .  Anxiety disorder Maternal Grandmother   . Cancer Maternal Grandfather        lung  . Diabetes Paternal Grandmother   . Diabetes Paternal Grandfather   . Aneurysm Paternal Grandfather   . Anxiety disorder Paternal Grandfather   . Drug abuse Father   . ADD / ADHD Brother   . Stroke Other   . Anxiety disorder Maternal Uncle   . Anxiety disorder Paternal Uncle   . Depression Paternal Uncle   . Anxiety disorder Maternal Aunt     Social History   Tobacco Use  . Smoking status: Current Every Day Smoker    Packs/day: 0.25    Years: 7.00    Pack years: 1.75    Types: Cigarettes  . Smokeless tobacco: Never Used  Vaping Use  . Vaping Use: Never used  Substance Use Topics  . Alcohol use: Yes    Comment: social use   . Drug use: No    Home Medications Prior to  Admission medications   Medication Sig Start Date End Date Taking? Authorizing Provider  acetaminophen (TYLENOL) 325 MG tablet Take 650 mg by mouth every 6 (six) hours as needed for moderate pain or headache.    [provider]  amphetamine-dextroamphetamine (ADDERALL) 20 MG tablet Take 1 tablet (20 mg total) by mouth 2 (two) times daily. 12/21/19 12/20/20  Myrlene Broker, MD  amphetamine-dextroamphetamine (ADDERALL) 20 MG tablet Take 1 tablet (20 mg total) by mouth 2 (two) times daily. 12/21/19 12/20/20  Myrlene Broker, MD  amphetamine-dextroamphetamine (ADDERALL) 20 MG tablet Take 1 tablet (20 mg total) by mouth 2 (two) times daily. 12/21/19 12/20/20  Myrlene Broker, MD  benzonatate (TESSALON) 100 MG capsule Take 1 capsule (100 mg total) by mouth 3 (three) times daily as needed for cough. 12/25/19   Avegno, Zachery Dakins, FNP  cetirizine (ZYRTEC ALLERGY) 10 MG tablet Take 1 tablet (10 mg total) by mouth daily. Patient taking differently: Take 10 mg by mouth daily as needed for allergies.  08/31/19   Avegno, Zachery Dakins, FNP  clonazePAM (KLONOPIN) 1 MG tablet TAKE 1 TABLET(1 MG) BY MOUTH TWICE DAILY AS NEEDED FOR ANXIETY 12/21/19   Myrlene Broker, MD  escitalopram (LEXAPRO) 20 MG tablet Take 1 tablet (20 mg total) by mouth daily. 12/21/19 12/20/20  Myrlene Broker, MD  fluticasone (FLONASE) 50 MCG/ACT nasal spray Place 1 spray into both nostrils daily for 14 days. 12/25/19 01/08/20  Avegno, Zachery Dakins, FNP  ibuprofen (ADVIL) 200 MG tablet Take 600 mg by mouth every 6 (six) hours as needed for headache or moderate pain.    [provider]  meloxicam (MOBIC) 15 MG tablet Take 1 tablet (15 mg total) by mouth daily. 12/05/19   Wurst, [place], PA-C  NUVARING 0.12-0.015 MG/24HR vaginal ring INSERT VAGINALLY AND LEAVE IN. PLACE FOR 3 CONSECUTIVE WEEKS, THEN REMOVE FOR 1 WEEK Patient taking differently: Place 1 each vaginally See admin instructions. PLACE FOR 3 CONSECUTIVE WEEKS, THEN REMOVE FOR 1  WEEK 11/28/15   Constant, Peggy, MD  omeprazole (PRILOSEC) 20 MG capsule Take 1 capsule (20 mg total) by mouth 2 (two) times daily. 12/19/19 06/16/20  Lanelle Bal, DO  ondansetron (ZOFRAN ODT) 4 MG disintegrating tablet Take 1 tablet (4 mg total) by mouth every 8 (eight) hours as needed for nausea or vomiting. 12/25/19   Avegno, Zachery Dakins, FNP  predniSONE (DELTASONE) 10 MG tablet Take 2 tablets (20 mg total) by mouth daily. 12/25/19   Durward Parcel, FNP  Allergies    Bactrim, Ciprofloxacin, Other, Penicillins, Percocet [oxycodone-acetaminophen], and Vancomycin  Review of Systems   Review of Systems  Constitutional: Positive for fever. Negative for appetite change and fatigue.  HENT: Negative for congestion, ear discharge and sinus pressure.   Eyes: Negative for discharge.  Respiratory: Negative for cough.   Cardiovascular: Negative for chest pain.  Gastrointestinal: Negative for abdominal pain and diarrhea.  Genitourinary: Negative for frequency and hematuria.  Musculoskeletal: Negative for back pain.  Skin: Negative for rash.  Neurological: Negative for seizures and headaches.  Psychiatric/Behavioral: Negative for hallucinations.    Physical Exam Updated Vital Signs BP 121/65 (BP Location: Right Arm)   Pulse 81   Temp 98.4 F (36.9 C) (Oral)   Resp 18   SpO2 97%   Physical Exam Constitutional:      Appearance: She is well-developed.  HENT:     Head: Normocephalic.  Eyes:     Conjunctiva/sclera: Conjunctivae normal.  Neck:     Trachea: No tracheal deviation.  Cardiovascular:     Heart sounds: No murmur heard.   Musculoskeletal:        General: Normal range of motion.  Skin:    General: Skin is warm.  Neurological:     Mental Status: She is oriented to person, place, and time.  Psychiatric:        Mood and Affect: Mood and affect normal.     ED Results / Procedures / Treatments   Labs (all labs ordered are listed, but only abnormal results are  displayed) Labs Reviewed  CBC WITH DIFFERENTIAL/PLATELET - Abnormal; Notable for the following components:      Result Value   WBC 10.9 (*)    Neutro Abs 8.3 (*)    All other components within normal limits  BASIC METABOLIC PANEL - Abnormal; Notable for the following components:   Sodium 132 (*)    CO2 20 (*)    Glucose, Bld 104 (*)    All other components within normal limits    EKG None  Radiology No results found.  Procedures Procedures   Medications Ordered in ED Medications  ibuprofen (ADVIL) tablet 800 mg (800 mg Oral Given 02/27/20 1716)  acetaminophen (TYLENOL) tablet 650 mg (650 mg Oral Given 02/27/20 1715)    ED Course  I have reviewed the triage vital signs and the nursing notes.  Pertinent labs & imaging results that were available during my care of the patient were reviewed by me and considered in my medical decision making (see chart for details).    MDM Rules/Calculators/A&P                          Febrile illness from immunization.  Patient improved with Tylenol Motrin will follow-up as needed Final Clinical Impression(s) / ED Diagnoses Final diagnoses:  Febrile illness    Rx / DC Orders ED Discharge Orders    None       Bethann Berkshire, MD 02/28/20 1136

## 2020-02-27 NOTE — ED Triage Notes (Signed)
2nd covid shot yesterday, pain at site and pain in extremities, with fever

## 2020-02-27 NOTE — ED Notes (Signed)
Pt. Tolerating water and crackers.

## 2020-02-27 NOTE — Discharge Instructions (Addendum)
Take Motrin and Tylenol for fevers and aches.  Drink plenty of fluids and follow-up with your doctor if any problems

## 2020-03-19 ENCOUNTER — Encounter: Payer: Self-pay | Admitting: Internal Medicine

## 2020-03-19 ENCOUNTER — Ambulatory Visit: Payer: 59 | Admitting: Gastroenterology

## 2020-03-20 ENCOUNTER — Encounter (HOSPITAL_COMMUNITY): Payer: Self-pay | Admitting: Psychiatry

## 2020-03-20 ENCOUNTER — Other Ambulatory Visit: Payer: Self-pay

## 2020-03-20 ENCOUNTER — Encounter (HOSPITAL_COMMUNITY): Payer: Self-pay

## 2020-03-20 ENCOUNTER — Telehealth (INDEPENDENT_AMBULATORY_CARE_PROVIDER_SITE_OTHER): Payer: 59 | Admitting: Psychiatry

## 2020-03-20 DIAGNOSIS — F411 Generalized anxiety disorder: Secondary | ICD-10-CM | POA: Diagnosis not present

## 2020-03-20 DIAGNOSIS — F900 Attention-deficit hyperactivity disorder, predominantly inattentive type: Secondary | ICD-10-CM | POA: Diagnosis not present

## 2020-03-20 MED ORDER — CLONAZEPAM 1 MG PO TABS: ORAL_TABLET | ORAL | 2 refills | Status: DC

## 2020-03-20 MED ORDER — AMPHETAMINE-DEXTROAMPHETAMINE 20 MG PO TABS: 20.0000 mg | ORAL_TABLET | Freq: Two times a day (BID) | ORAL | 0 refills | Status: DC

## 2020-03-20 MED ORDER — ESCITALOPRAM OXALATE 20 MG PO TABS: 20.0000 mg | ORAL_TABLET | Freq: Every day | ORAL | 2 refills | Status: DC

## 2020-03-20 NOTE — Progress Notes (Signed)
Virtual Visit via Video Note  I connected with Kathleen Velasquez on 03/20/20 at  9:00 AM EST by a video enabled telemedicine application and verified that I am speaking with the correct person using two identifiers.  Location: Patient: home Provider: home   I discussed the limitations of evaluation and management by telemedicine and the availability of in person appointments. The patient expressed understanding and agreed to proceed.    I discussed the assessment and treatment plan with the patient. The patient was provided an opportunity to ask questions and all were answered. The patient agreed with the plan and demonstrated an understanding of the instructions.   The patient was advised to call back or seek an in-person evaluation if the symptoms worsen or if the condition fails to improve as anticipated.  I provided 15 minutes of non-face-to-face time during this encounter.   [name], MD  [facility] MD/PA/NP OP Progress Note  03/20/2020 9:18 AM Kathleen Velasquez  MRN:  [id number]  Chief Complaint:  Chief Complaint    ADD; Anxiety; Depression; Follow-up     WUJ:WJXB patient is a 30 year old single white female who lives with her niece.. She often has her young niece staying with her. She is working as a Occupational hygienist at [employer].  Patient returns for follow-up after 3 months.  She states that she got her coronavirus shot in January but a couple of weeks later she got Covid.  About 2 weeks later she got her second shot and then she got quite ill as a reaction to the shot.  She had a high fever and had to be treated in the emergency room.  She is over all of this now and is feeling much better.  She states that her mood is good and she denies any specific symptoms of depression or anxiety.  She is sleeping fairly well.  The Adderall continues to help with her focus Visit Diagnosis:    ICD-10-CM   1. Generalized anxiety disorder  F41.1   2. Attention deficit  hyperactivity disorder (ADHD), predominantly inattentive type  F90.0     Past Psychiatric History: none  Past Medical History:  Past Medical History:  Diagnosis Date  . Abscess    right buttocks/thigh  . Anxiety   . Depression   . DUB (dysfunctional uterine bleeding) 05/25/2012  . Hydradenitis   . Hypothyroidism   . Polycystic ovaries   . Thyroid disease    hypothyroidism    Past Surgical History:  Procedure Laterality Date  . ADENOIDECTOMY    . BALLOON DILATION N/A 12/19/2019   Procedure: BALLOON DILATION;  Surgeon: Lanelle Bal, DO;  Location[facility];  Service: Endoscopy;  Laterality: N/A;  . BIOPSY  12/19/2019   Procedure: BIOPSY;  Surgeon: Lanelle Bal, DO;  Location: [facility];  Service: Endoscopy;;  . CHOLECYSTECTOMY  01/27/2012   Procedure: LAPAROSCOPIC CHOLECYSTECTOMY;  Surgeon: Fabio Bering, MD;  Location: [facility];  Service: General;  Laterality: N/A;  . ESOPHAGOGASTRODUODENOSCOPY (EGD) WITH PROPOFOL N/A 12/19/2019   Procedure: ESOPHAGOGASTRODUODENOSCOPY (EGD) WITH PROPOFOL;  Surgeon: Lanelle Bal, DO;  Location[facility];  Service: Endoscopy;  Laterality: N/A;  3:00pm  . HERNIA REPAIR Left    LIH  . HYDRADENITIS EXCISION  10/02/2011   Procedure: EXCISION HYDRADENITIS AXILLA;  Surgeon: Fabio Bering, MD;  Location: [facility];  Service: General;  Laterality: Right;  Right Axillary Dissection  . HYDRADENITIS EXCISION Left 10/19/2013   Procedure: INCISION OF  HIDRADENITIS SUPPURATIVA LEFT AXILLA;  Surgeon: Marlane HatcherWilliam S Bradford, MD;  Location: [facility];  Service: General;  Laterality: Left;  . HYDRADENITIS EXCISION Left 09/09/2018   Procedure: EXCISION HIDRADENITIS AXILLA, LEFT;  Surgeon: Franky MachoJenkins, Mark, MD;  Location: [facility];  Service: General;  Laterality: Left;  . TONSILLECTOMY      Family Psychiatric History: see below  Family History:  Family History  Problem Relation Age of Onset  . Diabetes Mother   . Hypertension Mother   . Hyperlipidemia  Mother   . Neuropathy Mother   . Diabetes Maternal Grandmother   . Hypertension Maternal Grandmother   . Heart disease Maternal Grandmother        CHF  . COPD Maternal Grandmother   . Cirrhosis Maternal Grandmother   . Anxiety disorder Maternal Grandmother   . Cancer Maternal Grandfather        lung  . Diabetes Paternal Grandmother   . Diabetes Paternal Grandfather   . Aneurysm Paternal Grandfather   . Anxiety disorder Paternal Grandfather   . Drug abuse Father   . ADD / ADHD Brother   . Stroke Other   . Anxiety disorder Maternal Uncle   . Anxiety disorder Paternal Uncle   . Depression Paternal Uncle   . Anxiety disorder Maternal Aunt     Social History:  Social History   Socioeconomic History  . Marital status: Single    Spouse name: Not on file  . Number of children: Not on file  . Years of education: Not on file  . Highest education level: Not on file  Occupational History  . Not on file  Tobacco Use  . Smoking status: Current Every Day Smoker    Packs/day: 0.25    Years: 7.00    Pack years: 1.75    Types: Cigarettes  . Smokeless tobacco: Never Used  Vaping Use  . Vaping Use: Never used  Substance and Sexual Activity  . Alcohol use: Yes    Comment: social use   . Drug use: No  . Sexual activity: Yes    Birth control/protection: None, Implant  Other Topics Concern  . Not on file  Social History Narrative  . Not on file   Social Determinants of Health   Financial Resource Strain: Not on file  Food Insecurity: Not on file  Transportation Needs: Not on file  Physical Activity: Not on file  Stress: Not on file  Social Connections: Not on file    Allergies:  Allergies  Allergen Reactions  . Bactrim Hives and Itching  . Ciprofloxacin Hives and Itching    CRNA Discontinued preop antibiotic  IVPB  Cipro in OR after developed itching and hives left arm.  Received Benadryl with relief noted. Dr.Gonzalez and Dr. Leticia PennaZiegler informed. DDallasRN  . Other Other  (See Comments)    [place] causes a hives, swelling, itching, and anaphylaxis.   Marland Kitchen. Penicillins Hives and Itching    Has patient had a PCN reaction causing immediate rash, facial/tongue/throat swelling, SOB or lightheadedness with hypotension: Yes Has patient had a PCN reaction causing severe rash involving mucus membranes or skin necrosis: No Has patient had a PCN reaction that required hospitalization No Has patient had a PCN reaction occurring within the last 10 years: No If all of the above answers are "NO", then may proceed with Cephalosporin use.   Marland Kitchen. Percocet [Oxycodone-Acetaminophen] Other (See Comments)    Face flushes  . Vancomycin Itching    Metabolic Disorder Labs: Lab Results  Component Value  Date   HGBA1C 5.7 (H) 05/25/2012   MPG 117 (H) 05/25/2012   No results found for: PROLACTIN No results found for: CHOL, TRIG, HDL, CHOLHDL, VLDL, LDLCALC Lab Results  Component Value Date   TSH 2.515 05/25/2012    Therapeutic Level Labs: No results found for: LITHIUM No results found for: VALPROATE No components found for:  CBMZ  Current Medications: Current Outpatient Medications  Medication Sig Dispense Refill  . acetaminophen (TYLENOL) 325 MG tablet Take 650 mg by mouth every 6 (six) hours as needed for moderate pain or headache.    . amphetamine-dextroamphetamine (ADDERALL) 20 MG tablet Take 1 tablet (20 mg total) by mouth 2 (two) times daily. 60 tablet 0  . amphetamine-dextroamphetamine (ADDERALL) 20 MG tablet Take 1 tablet (20 mg total) by mouth 2 (two) times daily. 60 tablet 0  . amphetamine-dextroamphetamine (ADDERALL) 20 MG tablet Take 1 tablet (20 mg total) by mouth 2 (two) times daily. 90 tablet 0  . benzonatate (TESSALON) 100 MG capsule Take 1 capsule (100 mg total) by mouth 3 (three) times daily as needed for cough. 30 capsule 0  . cetirizine (ZYRTEC ALLERGY) 10 MG tablet Take 1 tablet (10 mg total) by mouth daily. (Patient taking differently: Take 10 mg by mouth  daily as needed for allergies. ) 30 tablet 0  . clonazePAM (KLONOPIN) 1 MG tablet TAKE 1 TABLET(1 MG) BY MOUTH TWICE DAILY AS NEEDED FOR ANXIETY 60 tablet 2  . escitalopram (LEXAPRO) 20 MG tablet Take 1 tablet (20 mg total) by mouth daily. 90 tablet 2  . fluticasone (FLONASE) 50 MCG/ACT nasal spray Place 1 spray into both nostrils daily for 14 days. 16 g 0  . ibuprofen (ADVIL) 200 MG tablet Take 600 mg by mouth every 6 (six) hours as needed for headache or moderate pain.    . meloxicam (MOBIC) 15 MG tablet Take 1 tablet (15 mg total) by mouth daily. 30 tablet 0  . NUVARING 0.12-0.015 MG/24HR vaginal ring INSERT VAGINALLY AND LEAVE IN. PLACE FOR 3 CONSECUTIVE WEEKS, THEN REMOVE FOR 1 WEEK (Patient taking differently: Place 1 each vaginally See admin instructions. PLACE FOR 3 CONSECUTIVE WEEKS, THEN REMOVE FOR 1 WEEK) 1 each 10  . omeprazole (PRILOSEC) 20 MG capsule Take 1 capsule (20 mg total) by mouth 2 (two) times daily. 60 capsule 5  . ondansetron (ZOFRAN ODT) 4 MG disintegrating tablet Take 1 tablet (4 mg total) by mouth every 8 (eight) hours as needed for nausea or vomiting. 20 tablet 0  . predniSONE (DELTASONE) 10 MG tablet Take 2 tablets (20 mg total) by mouth daily. 15 tablet 0   No current facility-administered medications for this visit.     Musculoskeletal: Strength & Muscle Tone: within normal limits Gait & Station: normal Patient leans: N/A  Psychiatric Specialty Exam: Review of Systems  All other systems reviewed and are negative.   There were no vitals taken for this visit.There is no height or weight on file to calculate BMI.  General Appearance: Casual and Fairly Groomed  Eye Contact:  Good  Speech:  Clear and Coherent  Volume:  Normal  Mood:  Euthymic  Affect:  Congruent  Thought Process:  Goal Directed  Orientation:  Full (Time, Place, and Person)  Thought Content: WDL   Suicidal Thoughts:  No  Homicidal Thoughts:  No  Memory:  Immediate;   Good Recent;    Good Remote;   Fair  Judgement:  Good  Insight:  Good  Psychomotor Activity:  Normal  Concentration:  Concentration: Good and Attention Span: Good  Recall:  Good  Fund of Knowledge: Good  Language: Good  Akathisia:  No  Handed:  Right  AIMS (if indicated): not done  Assets:  Communication Skills Desire for Improvement Physical Health Resilience Social Support Talents/Skills  ADL's:  Intact  Cognition: WNL  Sleep:  Good   Screenings: Flowsheet Row ED from 02/27/2020 in [facility] EMERGENCY DEPARTMENT  C-SSRS RISK CATEGORY No Risk       Assessment and Plan: This patient is a 30 year old female with a history of depression anxiety and ADHD.  She continues to do well on her current regimen.  She will continue Adderall 20 mg twice daily for ADD, Lexapro 20 mg daily for depression and anxiety and clonazepam 1 mg twice daily for anxiety.  She will return to see me in 3 months   [name], MD 03/20/2020, 9:18 AM

## 2020-04-17 ENCOUNTER — Encounter: Payer: Self-pay | Admitting: Podiatry

## 2020-04-17 ENCOUNTER — Ambulatory Visit: Payer: 59 | Admitting: Podiatry

## 2020-04-17 ENCOUNTER — Ambulatory Visit (INDEPENDENT_AMBULATORY_CARE_PROVIDER_SITE_OTHER): Payer: 59

## 2020-04-17 ENCOUNTER — Other Ambulatory Visit: Payer: Self-pay

## 2020-04-17 DIAGNOSIS — M19079 Primary osteoarthritis, unspecified ankle and foot: Secondary | ICD-10-CM

## 2020-04-17 MED ORDER — METHYLPREDNISOLONE 4 MG PO TBPK: ORAL_TABLET | ORAL | 0 refills | Status: DC

## 2020-04-17 MED ORDER — MELOXICAM 15 MG PO TABS: 15.0000 mg | ORAL_TABLET | Freq: Every day | ORAL | 0 refills | Status: DC

## 2020-04-17 MED ORDER — MELOXICAM 15 MG PO TABS
15.0000 mg | ORAL_TABLET | Freq: Every day | ORAL | 0 refills | Status: DC
Start: 2020-04-17 — End: 2020-11-05
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
## 2020-05-14 ENCOUNTER — Other Ambulatory Visit (HOSPITAL_COMMUNITY): Payer: Self-pay | Admitting: Psychiatry

## 2020-05-14 ENCOUNTER — Telehealth (HOSPITAL_COMMUNITY): Payer: Self-pay | Admitting: *Deleted

## 2020-05-14 MED ORDER — AMPHETAMINE-DEXTROAMPHETAMINE 20 MG PO TABS: 20.0000 mg | ORAL_TABLET | Freq: Two times a day (BID) | ORAL | 0 refills | Status: DC

## 2020-05-14 NOTE — Telephone Encounter (Signed)
Resent for #[id number]

## 2020-05-14 NOTE — Telephone Encounter (Signed)
Patient pharmacy would like to know if they need to fill 90 tabs from 04-19-2020 or fill for 60 tabs.   Per pharmacy, if provider would like script to be quantity of 60 tabs to please resend a new script to them due to pt needing refills for her script.  Per pt she only have today and she will be out.

## 2020-05-15 ENCOUNTER — Ambulatory Visit: Payer: 59 | Admitting: Podiatry

## 2020-07-01 ENCOUNTER — Telehealth (INDEPENDENT_AMBULATORY_CARE_PROVIDER_SITE_OTHER): Payer: 59 | Admitting: Psychiatry

## 2020-07-01 ENCOUNTER — Other Ambulatory Visit: Payer: Self-pay

## 2020-07-01 ENCOUNTER — Encounter (HOSPITAL_COMMUNITY): Payer: Self-pay | Admitting: Psychiatry

## 2020-07-01 DIAGNOSIS — F900 Attention-deficit hyperactivity disorder, predominantly inattentive type: Secondary | ICD-10-CM | POA: Diagnosis not present

## 2020-07-01 DIAGNOSIS — F411 Generalized anxiety disorder: Secondary | ICD-10-CM

## 2020-07-01 MED ORDER — AMPHETAMINE-DEXTROAMPHETAMINE 20 MG PO TABS: 20.0000 mg | ORAL_TABLET | Freq: Two times a day (BID) | ORAL | 0 refills | Status: DC

## 2020-07-01 MED ORDER — CLONAZEPAM 1 MG PO TABS: ORAL_TABLET | ORAL | 2 refills | Status: DC

## 2020-07-01 MED ORDER — ESCITALOPRAM OXALATE 20 MG PO TABS: 20.0000 mg | ORAL_TABLET | Freq: Every day | ORAL | 2 refills | Status: DC

## 2020-07-01 NOTE — Progress Notes (Signed)
Virtual Visit via Video Note  I connected with Kathleen Velasquez on 07/01/20 at  3:00 PM EDT by a video enabled telemedicine application and verified that I am speaking with the correct person using two identifiers.  Location: Patient: home Provider: home office   I discussed the limitations of evaluation and management by telemedicine and the availability of in person appointments. The patient expressed understanding and agreed to proceed.     I discussed the assessment and treatment plan with the patient. The patient was provided an opportunity to ask questions and all were answered. The patient agreed with the plan and demonstrated an understanding of the instructions.   The patient was advised to call back or seek an in-person evaluation if the symptoms worsen or if the condition fails to improve as anticipated.  I provided 15 minutes of non-face-to-face time during this encounter.   Diannia Rudereborah Adalyna Godbee, MD  [facility] MD/PA/NP OP Progress Note  07/01/2020 3:18 PM Kathleen Velasquez  MRN:  [id number]  Chief Complaint:  Chief Complaint   Anxiety; Depression    HPI: This patient is a 30 year old single white female who lives [place] in [place].  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  The patient returns for follow-up after 3 months.  In general she is doing very well.  She enjoys her job.  She takes care of her 31-year-old niece who primarily lives with her.  She has not had any recurrent illness.  She still has a high good adenitis but she is prescribed prednisone which seems to end up with antibiotic.  Her mood has been good and she denies significant depression anxiety or suicidal ideation.  She continues to focus well with the Adderall. Visit Diagnosis:    ICD-10-CM   1. Generalized anxiety disorder  F41.1     2. Attention deficit hyperactivity disorder (ADHD), predominantly inattentive type  F90.0       Past Psychiatric History: none  Past Medical  History:  Past Medical History:  Diagnosis Date   Abscess    right buttocks/thigh   Anxiety    Depression    DUB (dysfunctional uterine bleeding) 05/25/2012   Hydradenitis    Hypothyroidism    Polycystic ovaries    Thyroid disease    hypothyroidism    Past Surgical History:  Procedure Laterality Date   ADENOIDECTOMY     BALLOON DILATION N/A 12/19/2019   Procedure: BALLOON DILATION;  Surgeon: Lanelle Balarver, Charles K, DO;  [facility];  Service: Endoscopy;  Laterality: N/A;   BIOPSY  12/19/2019   Procedure: BIOPSY;  Surgeon: Lanelle Balarver, Charles K, DO;  [facility];  Service: Endoscopy;;   CHOLECYSTECTOMY  01/27/2012   Procedure: LAPAROSCOPIC CHOLECYSTECTOMY;  Surgeon: Fabio BeringBrent C Ziegler, MD;  Location: [facility];  Service: General;  Laterality: N/A;   ESOPHAGOGASTRODUODENOSCOPY (EGD) WITH PROPOFOL N/A 12/19/2019   Procedure: ESOPHAGOGASTRODUODENOSCOPY (EGD) WITH PROPOFOL;  Surgeon: Lanelle Balarver, Charles K, DO;  [facility];  Service: Endoscopy;  Laterality: N/A;  3:00pm   HERNIA REPAIR Left    LIH   HYDRADENITIS EXCISION  10/02/2011   Procedure: EXCISION HYDRADENITIS AXILLA;  Surgeon: Fabio BeringBrent C Ziegler, MD;  Location: [facility];  Service: General;  Laterality: Right;  Right Axillary Dissection   HYDRADENITIS EXCISION Left 10/19/2013   Procedure: INCISION OF HIDRADENITIS SUPPURATIVA LEFT AXILLA;  Surgeon: Marlane HatcherWilliam S Bradford, MD;  Location: [facility];  Service: General;  Laterality: Left;   HYDRADENITIS EXCISION Left 09/09/2018  Procedure: EXCISION HIDRADENITIS AXILLA, LEFT;  Surgeon: Franky Macho, MD;  Location: [facility];  Service: General;  Laterality: Left;   TONSILLECTOMY      Family Psychiatric History: see below  Family History:  Family History  Problem Relation Age of Onset   Diabetes Mother    Hypertension Mother    Hyperlipidemia Mother    Neuropathy Mother    Diabetes Maternal Grandmother    Hypertension Maternal Grandmother    Heart disease Maternal Grandmother         CHF   COPD Maternal Grandmother    Cirrhosis Maternal Grandmother    Anxiety disorder Maternal Grandmother    Cancer Maternal Grandfather        lung   Diabetes Paternal Grandmother    Diabetes Paternal Grandfather    Aneurysm Paternal Grandfather    Anxiety disorder Paternal Grandfather    Drug abuse Father    ADD / ADHD Brother    Stroke Other    Anxiety disorder Maternal Uncle    Anxiety disorder Paternal Uncle    Depression Paternal Uncle    Anxiety disorder Maternal Aunt     Social History:  Social History   Socioeconomic History   Marital status: Single    Spouse name: Not on file   Number of children: Not on file   Years of education: Not on file   Highest education level: Not on file  Occupational History   Not on file  Tobacco Use   Smoking status: Every Day    Packs/day: 0.25    Years: 7.00    Pack years: 1.75    Types: Cigarettes   Smokeless tobacco: Never  Vaping Use   Vaping Use: Never used  Substance and Sexual Activity   Alcohol use: Yes    Comment: social use    Drug use: No   Sexual activity: Yes    Birth control/protection: None, Implant  Other Topics Concern   Not on file  Social History Narrative   Not on file   Social Determinants of Health   Financial Resource Strain: Not on file  Food Insecurity: Not on file  Transportation Needs: Not on file  Physical Activity: Not on file  Stress: Not on file  Social Connections: Not on file    Allergies:  Allergies  Allergen Reactions   Bactrim Hives and Itching   Ciprofloxacin Hives and Itching    CRNA Discontinued preop antibiotic  IVPB  Cipro in OR after developed itching and hives left arm.  Received Benadryl with relief noted. Dr.Gonzalez and Dr. Leticia Penna informed. DDallasRN   Other Other (See Comments)    [place] causes a hives, swelling, itching, and anaphylaxis.    Penicillins Hives and Itching    Has patient had a PCN reaction causing immediate rash, facial/tongue/throat  swelling, SOB or lightheadedness with hypotension: Yes Has patient had a PCN reaction causing severe rash involving mucus membranes or skin necrosis: No Has patient had a PCN reaction that required hospitalization No Has patient had a PCN reaction occurring within the last 10 years: No If all of the above answers are "NO", then may proceed with Cephalosporin use.    Percocet [Oxycodone-Acetaminophen] Other (See Comments)    Face flushes   Vancomycin Itching    Metabolic Disorder Labs: Lab Results  Component Value Date   HGBA1C 5.7 (H) 05/25/2012   MPG 117 (H) 05/25/2012   No results found for: PROLACTIN No results found for: CHOL, TRIG, HDL, CHOLHDL, VLDL, LDLCALC Lab  Results  Component Value Date   TSH 2.515 05/25/2012    Therapeutic Level Labs: No results found for: LITHIUM No results found for: VALPROATE No components found for:  CBMZ  Current Medications: Current Outpatient Medications  Medication Sig Dispense Refill   acetaminophen (TYLENOL) 325 MG tablet Take 650 mg by mouth every 6 (six) hours as needed for moderate pain or headache.     amphetamine-dextroamphetamine (ADDERALL) 20 MG tablet Take 1 tablet (20 mg total) by mouth 2 (two) times daily. 60 tablet 0   amphetamine-dextroamphetamine (ADDERALL) 20 MG tablet Take 1 tablet (20 mg total) by mouth 2 (two) times daily. 90 tablet 0   amphetamine-dextroamphetamine (ADDERALL) 20 MG tablet Take 1 tablet (20 mg total) by mouth 2 (two) times daily. 60 tablet 0   benzonatate (TESSALON) 100 MG capsule Take 1 capsule (100 mg total) by mouth 3 (three) times daily as needed for cough. 30 capsule 0   cetirizine (ZYRTEC ALLERGY) 10 MG tablet Take 1 tablet (10 mg total) by mouth daily. (Patient taking differently: Take 10 mg by mouth daily as needed for allergies. ) 30 tablet 0   clonazePAM (KLONOPIN) 1 MG tablet TAKE 1 TABLET(1 MG) BY MOUTH TWICE DAILY AS NEEDED FOR ANXIETY 60 tablet 2   escitalopram (LEXAPRO) 20 MG tablet Take 1  tablet (20 mg total) by mouth daily. 90 tablet 2   fluticasone (FLONASE) 50 MCG/ACT nasal spray Place 1 spray into both nostrils daily for 14 days. 16 g 0   ibuprofen (ADVIL) 200 MG tablet Take 600 mg by mouth every 6 (six) hours as needed for headache or moderate pain.     meloxicam (MOBIC) 15 MG tablet Take 1 tablet (15 mg total) by mouth daily. 30 tablet 0   meloxicam (MOBIC) 15 MG tablet Take 1 tablet (15 mg total) by mouth daily. 30 tablet 0   methylPREDNISolone (MEDROL DOSEPAK) 4 MG TBPK tablet Take as directed 21 each 0   NUVARING 0.12-0.015 MG/24HR vaginal ring INSERT VAGINALLY AND LEAVE IN. PLACE FOR 3 CONSECUTIVE WEEKS, THEN REMOVE FOR 1 WEEK (Patient taking differently: Place 1 each vaginally See admin instructions. PLACE FOR 3 CONSECUTIVE WEEKS, THEN REMOVE FOR 1 WEEK) 1 each 10   omeprazole (PRILOSEC) 20 MG capsule Take 1 capsule (20 mg total) by mouth 2 (two) times daily. 60 capsule 5   ondansetron (ZOFRAN ODT) 4 MG disintegrating tablet Take 1 tablet (4 mg total) by mouth every 8 (eight) hours as needed for nausea or vomiting. 20 tablet 0   predniSONE (DELTASONE) 10 MG tablet Take 2 tablets (20 mg total) by mouth daily. 15 tablet 0   No current facility-administered medications for this visit.     Musculoskeletal: Strength & Muscle Tone: within normal limits Gait & Station: normal Patient leans: N/A  Psychiatric Specialty Exam: Review of Systems  All other systems reviewed and are negative.  There were no vitals taken for this visit.There is no height or weight on file to calculate BMI.  General Appearance: Casual and Fairly Groomed  Eye Contact:  Good  Speech:  Clear and Coherent  Volume:  Normal  Mood:  Euthymic  Affect:  Appropriate and Congruent  Thought Process:  Goal Directed  Orientation:  Full (Time, Place, and Person)  Thought Content: WDL   Suicidal Thoughts:  No  Homicidal Thoughts:  No  Memory:  Immediate;   Good Recent;   Good Remote;   Good   Judgement:  Good  Insight:  Fair  Psychomotor  Activity:  Normal  Concentration:  Concentration: Good and Attention Span: Good  Recall:  Good  Fund of Knowledge: Good  Language: Good  Akathisia:  No  Handed:  Right  AIMS (if indicated): not done  Assets:  Communication Skills Desire for Improvement Physical Health Resilience Social Support Talents/Skills  ADL's:  Intact  Cognition: WNL  Sleep:  Good   Screenings: PHQ2-9    Flowsheet Row [facility]  PHQ-2 Total Score 0      Flowsheet Row Video Visit from 07/01/2020 [facility] ED from 02/27/2020 in [facility] EMERGENCY DEPARTMENT  C-SSRS RISK CATEGORY No Risk No Risk        Assessment and Plan: This patient is a 30 year old female with a history of depression anxiety and ADHD.  She continues to do very well.  She will continue Lexapro 20 mg daily for depression and anxiety, clonazepam 1 mg twice daily for anxiety and Adderall 20 mg twice daily for ADD.  She will return to see me in 3 months   [name], MD 07/01/2020, 3:18 PM

## 2020-09-30 ENCOUNTER — Encounter (HOSPITAL_COMMUNITY): Payer: Self-pay | Admitting: Psychiatry

## 2020-09-30 ENCOUNTER — Other Ambulatory Visit: Payer: Self-pay

## 2020-09-30 ENCOUNTER — Telehealth (INDEPENDENT_AMBULATORY_CARE_PROVIDER_SITE_OTHER): Payer: 59 | Admitting: Psychiatry

## 2020-09-30 DIAGNOSIS — F900 Attention-deficit hyperactivity disorder, predominantly inattentive type: Secondary | ICD-10-CM

## 2020-09-30 DIAGNOSIS — F411 Generalized anxiety disorder: Secondary | ICD-10-CM | POA: Diagnosis not present

## 2020-09-30 MED ORDER — AMPHETAMINE-DEXTROAMPHETAMINE 20 MG PO TABS: 20.0000 mg | ORAL_TABLET | Freq: Two times a day (BID) | ORAL | 0 refills | Status: DC

## 2020-09-30 MED ORDER — CLONAZEPAM 1 MG PO TABS: ORAL_TABLET | ORAL | 2 refills | Status: DC

## 2020-09-30 MED ORDER — ESCITALOPRAM OXALATE 20 MG PO TABS: 20.0000 mg | ORAL_TABLET | Freq: Every day | ORAL | 2 refills | Status: DC

## 2020-09-30 NOTE — Progress Notes (Signed)
Virtual Visit via Telephone Note  I connected with Kathleen Velasquez on 09/30/20 at  9:00 AM EDT by telephone and verified that I am speaking with the correct person using two identifiers.  Location: Patient: home Provider: home office   I discussed the limitations, risks, security and privacy concerns of performing an evaluation and management service by telephone and the availability of in person appointments. I also discussed with the patient that there may be a patient responsible charge related to this service. The patient expressed understanding and agreed to proceed.      I discussed the assessment and treatment plan with the patient. The patient was provided an opportunity to ask questions and all were answered. The patient agreed with the plan and demonstrated an understanding of the instructions.   The patient was advised to call back or seek an in-person evaluation if the symptoms worsen or if the condition fails to improve as anticipated.  I provided 14 minutes of non-face-to-face time during this encounter.   [name], MD  [facility] MD/PA/NP OP Progress Note  09/30/2020 9:18 AM Kathleen Velasquez  MRN:  [id number]  Chief Complaint:  Chief Complaint   ADD; Anxiety; Depression; Follow-up    HPI: This patient is a 30 year old single white female who lives with her niece in [place].  She works as a Occupational hygienist at [employer].  The patient returns for follow-up after 3 months.  She states that she has had a good summer.  She just went on a weekend trip to [place] with her family and thoroughly enjoyed that.  Her mood has been good.  She is sleeping well.  Her energy is good.  She is enjoying her job.  She denies significant depression anxiety or suicidal ideation.  She is focusing well with the Adderall Visit Diagnosis:    ICD-10-CM   1. Generalized anxiety disorder  F41.1     2. Attention deficit hyperactivity disorder (ADHD), predominantly inattentive  type  F90.0       Past Psychiatric History: none  Past Medical History:  Past Medical History:  Diagnosis Date   Abscess    right buttocks/thigh   Anxiety    Depression    DUB (dysfunctional uterine bleeding) 05/25/2012   Hydradenitis    Hypothyroidism    Polycystic ovaries    Thyroid disease    hypothyroidism    Past Surgical History:  Procedure Laterality Date   ADENOIDECTOMY     BALLOON DILATION N/A 12/19/2019   Procedure: BALLOON DILATION;  Surgeon: Lanelle Bal, DO;  [facility];  Service: Endoscopy;  Laterality: N/A;   BIOPSY  12/19/2019   Procedure: BIOPSY;  Surgeon: Lanelle Bal, DO;  [facility];  Service: Endoscopy;;   CHOLECYSTECTOMY  01/27/2012   Procedure: LAPAROSCOPIC CHOLECYSTECTOMY;  Surgeon: Fabio Bering, MD;  Location: [facility];  Service: General;  Laterality: N/A;   ESOPHAGOGASTRODUODENOSCOPY (EGD) WITH PROPOFOL N/A 12/19/2019   Procedure: ESOPHAGOGASTRODUODENOSCOPY (EGD) WITH PROPOFOL;  Surgeon: Lanelle Bal, DO;  [facility];  Service: Endoscopy;  Laterality: N/A;  3:00pm   HERNIA REPAIR Left    LIH   HYDRADENITIS EXCISION  10/02/2011   Procedure: EXCISION HYDRADENITIS AXILLA;  Surgeon: Fabio Bering, MD;  Location: [facility];  Service: General;  Laterality: Right;  Right Axillary Dissection   HYDRADENITIS EXCISION Left 10/19/2013   Procedure: INCISION OF HIDRADENITIS SUPPURATIVA LEFT AXILLA;  Surgeon: Marlane Hatcher, MD;  Location: [facility];  Service: General;  Laterality: Left;   HYDRADENITIS EXCISION Left 09/09/2018   Procedure: EXCISION HIDRADENITIS AXILLA, LEFT;  Surgeon: Franky Macho, MD;  Location: [facility];  Service: General;  Laterality: Left;   TONSILLECTOMY      Family Psychiatric History: see below  Family History:  Family History  Problem Relation Age of Onset   Diabetes Mother    Hypertension Mother    Hyperlipidemia Mother    Neuropathy Mother    Diabetes Maternal Grandmother     Hypertension Maternal Grandmother    Heart disease Maternal Grandmother        CHF   COPD Maternal Grandmother    Cirrhosis Maternal Grandmother    Anxiety disorder Maternal Grandmother    Cancer Maternal Grandfather        lung   Diabetes Paternal Grandmother    Diabetes Paternal Grandfather    Aneurysm Paternal Grandfather    Anxiety disorder Paternal Grandfather    Drug abuse Father    ADD / ADHD Brother    Stroke Other    Anxiety disorder Maternal Uncle    Anxiety disorder Paternal Uncle    Depression Paternal Uncle    Anxiety disorder Maternal Aunt     Social History:  Social History   Socioeconomic History   Marital status: Single    Spouse name: Not on file   Number of children: Not on file   Years of education: Not on file   Highest education level: Not on file  Occupational History   Not on file  Tobacco Use   Smoking status: Every Day    Packs/day: 0.25    Years: 7.00    Pack years: 1.75    Types: Cigarettes   Smokeless tobacco: Never  Vaping Use   Vaping Use: Never used  Substance and Sexual Activity   Alcohol use: Yes    Comment: social use    Drug use: No   Sexual activity: Yes    Birth control/protection: None, Implant  Other Topics Concern   Not on file  Social History Narrative   Not on file   Social Determinants of Health   Financial Resource Strain: Not on file  Food Insecurity: Not on file  Transportation Needs: Not on file  Physical Activity: Not on file  Stress: Not on file  Social Connections: Not on file    Allergies:  Allergies  Allergen Reactions   Bactrim Hives and Itching   Ciprofloxacin Hives and Itching    CRNA Discontinued preop antibiotic  IVPB  Cipro in OR after developed itching and hives left arm.  Received Benadryl with relief noted. Dr.Gonzalez and Dr. Leticia Penna informed. DDallasRN   Other Other (See Comments)    [place] causes a hives, swelling, itching, and anaphylaxis.    Penicillins Hives and Itching    Has  patient had a PCN reaction causing immediate rash, facial/tongue/throat swelling, SOB or lightheadedness with hypotension: Yes Has patient had a PCN reaction causing severe rash involving mucus membranes or skin necrosis: No Has patient had a PCN reaction that required hospitalization No Has patient had a PCN reaction occurring within the last 10 years: No If all of the above answers are "NO", then may proceed with Cephalosporin use.    Percocet [Oxycodone-Acetaminophen] Other (See Comments)    Face flushes   Vancomycin Itching    Metabolic Disorder Labs: Lab Results  Component Value Date   HGBA1C 5.7 (H) 05/25/2012   MPG 117 (H) 05/25/2012   No results found  for: PROLACTIN No results found for: CHOL, TRIG, HDL, CHOLHDL, VLDL, LDLCALC Lab Results  Component Value Date   TSH 2.515 05/25/2012    Therapeutic Level Labs: No results found for: LITHIUM No results found for: VALPROATE No components found for:  CBMZ  Current Medications: Current Outpatient Medications  Medication Sig Dispense Refill   acetaminophen (TYLENOL) 325 MG tablet Take 650 mg by mouth every 6 (six) hours as needed for moderate pain or headache.     amphetamine-dextroamphetamine (ADDERALL) 20 MG tablet Take 1 tablet (20 mg total) by mouth 2 (two) times daily. 60 tablet 0   amphetamine-dextroamphetamine (ADDERALL) 20 MG tablet Take 1 tablet (20 mg total) by mouth 2 (two) times daily. 90 tablet 0   amphetamine-dextroamphetamine (ADDERALL) 20 MG tablet Take 1 tablet (20 mg total) by mouth 2 (two) times daily. 60 tablet 0   benzonatate (TESSALON) 100 MG capsule Take 1 capsule (100 mg total) by mouth 3 (three) times daily as needed for cough. 30 capsule 0   cetirizine (ZYRTEC ALLERGY) 10 MG tablet Take 1 tablet (10 mg total) by mouth daily. (Patient taking differently: Take 10 mg by mouth daily as needed for allergies. ) 30 tablet 0   clonazePAM (KLONOPIN) 1 MG tablet TAKE 1 TABLET(1 MG) BY MOUTH TWICE DAILY AS NEEDED  FOR ANXIETY 60 tablet 2   escitalopram (LEXAPRO) 20 MG tablet Take 1 tablet (20 mg total) by mouth daily. 90 tablet 2   fluticasone (FLONASE) 50 MCG/ACT nasal spray Place 1 spray into both nostrils daily for 14 days. 16 g 0   ibuprofen (ADVIL) 200 MG tablet Take 600 mg by mouth every 6 (six) hours as needed for headache or moderate pain.     meloxicam (MOBIC) 15 MG tablet Take 1 tablet (15 mg total) by mouth daily. 30 tablet 0   meloxicam (MOBIC) 15 MG tablet Take 1 tablet (15 mg total) by mouth daily. 30 tablet 0   methylPREDNISolone (MEDROL DOSEPAK) 4 MG TBPK tablet Take as directed 21 each 0   NUVARING 0.12-0.015 MG/24HR vaginal ring INSERT VAGINALLY AND LEAVE IN. PLACE FOR 3 CONSECUTIVE WEEKS, THEN REMOVE FOR 1 WEEK (Patient taking differently: Place 1 each vaginally See admin instructions. PLACE FOR 3 CONSECUTIVE WEEKS, THEN REMOVE FOR 1 WEEK) 1 each 10   omeprazole (PRILOSEC) 20 MG capsule Take 1 capsule (20 mg total) by mouth 2 (two) times daily. 60 capsule 5   ondansetron (ZOFRAN ODT) 4 MG disintegrating tablet Take 1 tablet (4 mg total) by mouth every 8 (eight) hours as needed for nausea or vomiting. 20 tablet 0   predniSONE (DELTASONE) 10 MG tablet Take 2 tablets (20 mg total) by mouth daily. 15 tablet 0   No current facility-administered medications for this visit.     Musculoskeletal: Strength & Muscle Tone: na Gait & Station: na Patient leans: N/A  Psychiatric Specialty Exam: Review of Systems  All other systems reviewed and are negative.  There were no vitals taken for this visit.There is no height or weight on file to calculate BMI.  General Appearance: NA  Eye Contact:  NA  Speech:  Clear and Coherent  Volume:  Normal  Mood:  Euthymic  Affect:  NA  Thought Process:  Goal Directed  Orientation:  Full (Time, Place, and Person)  Thought Content: WDL   Suicidal Thoughts:  No  Homicidal Thoughts:  No  Memory:  Immediate;   Good Recent;   Good Remote;   Good   Judgement:  Good  Insight:  Good  Psychomotor Activity:  Normal  Concentration:  Concentration: Good and Attention Span: Good  Recall:  Good  Fund of Knowledge: Good  Language: Good  Akathisia:  No  Handed:  Right  AIMS (if indicated): not done  Assets:  Communication Skills Desire for Improvement Physical Health Resilience Social Support Talents/Skills  ADL's:  Intact  Cognition: WNL  Sleep:  Good   Screenings: PHQ2-9    Flowsheet Row Video [facility]30[facility]2020 [facility] Video Visit from 07/01/2020 in [facility]  PHQ-2 Total Score 0 0      Flowsheet Row Video Visit from 09/30/2020 in [facility] Video Visit from 07/01/2020 in [facility] ED from 02/27/2020 in [facility] EMERGENCY DEPART[facility]  C-SSRS RISK CATEGORY No Risk No Risk No Risk        Assessment and Plan: This patient is a 30 year old female with a history of depression anxiety and ADHD.  She continues to do very well on her current regimen.  She will continue Lexapro 20 mg daily for depression and anxiety, clonazepam 1 mg twice daily for anxiety and Adderall 20 mg twice daily for ADD.  She will return to see me in 3 months   [name], MD 09/30/2020, 9:18 AM

## 2020-11-05 ENCOUNTER — Ambulatory Visit
Admission: EM | Admit: 2020-11-05 | Discharge: 2020-11-05 | Disposition: A | Discharge status: Home / Self Care | Payer: 59 | Attending: Emergency Medicine | Admitting: Emergency Medicine

## 2020-11-05 ENCOUNTER — Encounter: Payer: Self-pay | Admitting: Emergency Medicine

## 2020-11-05 ENCOUNTER — Other Ambulatory Visit: Payer: Self-pay

## 2020-11-05 DIAGNOSIS — J069 Acute upper respiratory infection, unspecified: Secondary | ICD-10-CM | POA: Diagnosis not present

## 2020-11-05 DIAGNOSIS — J0100 Acute maxillary sinusitis, unspecified: Secondary | ICD-10-CM

## 2020-11-05 DIAGNOSIS — Z20822 Contact with and (suspected) exposure to covid-19: Secondary | ICD-10-CM

## 2020-11-05 LAB — POCT INFLUENZA A/B
Influenza A, POC: NEGATIVE
Influenza B, POC: NEGATIVE

## 2020-11-05 MED ORDER — HYDROCOD POLST-CPM POLST ER 10-8 MG/5ML PO SUER: 5.0000 mL | Freq: Two times a day (BID) | ORAL | 0 refills | Status: DC | PRN

## 2020-11-05 MED ORDER — IBUPROFEN 600 MG PO TABS: 600.0000 mg | ORAL_TABLET | Freq: Four times a day (QID) | ORAL | 0 refills | Status: DC | PRN

## 2020-11-05 MED ORDER — DOXYCYCLINE HYCLATE 100 MG PO CAPS: 100.0000 mg | ORAL_CAPSULE | Freq: Two times a day (BID) | ORAL | 0 refills | Status: AC

## 2020-11-05 NOTE — Discharge Instructions (Signed)
Somebody will contact you if your rapid flu is positive.  If COVID is positive, will prescribe Molnupiravir.  If flu is positive, will prescribe Tamiflu.  In the meantime, start Flonase, saline nasal irrigation, discontinue DayQuil/NyQuil, 1000 mg Tylenol combined with 600 mg of ibuprofen 3-4 times a day as needed, Mucinex D, Tussionex for cough.   Wait-and-see prescription of doxycycline if COVID and flu are negative.

## 2020-11-05 NOTE — ED Provider Notes (Signed)
HPI  SUBJECTIVE:  Kathleen Velasquez is a 30 y.o. female who presents with fevers T-max 102, headache, nasal congestion, fatigue, body aches, muffled hearing bilaterally, "clogged" ears bilaterally starting yesterday.  She also reports frontal sinus pain and pressure, postnasal drip.  She reports a cough starting this morning.  She was exposed to hand-foot-and-mouth 2 days ago.  Denies rash, otorrhea, rhinorrhea, facial swelling, upper dental pain, sore throat, loss of sense of smell or taste, wheezing, shortness of breath, nausea, vomiting, diarrhea, abdominal pain.  No known COVID or flu exposure.  She got the second dose of the COVID-vaccine.  She has not yet gotten the flu vaccine.  She had a negative home COVID test.  No antipyretic in the past 6 hours.  No antibiotics in the past month.  She has been taking NyQuil, DayQuil and has tried 2 doses of Flonase without improvement in her symptoms.  No aggravating factors.  She has a past medical history of COVID in January 2022, status post tonsillectomy.  LMP: Amenorrheic due to OCPs.  Denies the possibility being pregnant.  PMD: [facility].    Past Medical History:  Diagnosis Date   Abscess    right buttocks/thigh   Anxiety    Depression    DUB (dysfunctional uterine bleeding) 05/25/2012   Hydradenitis    Hypothyroidism    Polycystic ovaries    Thyroid disease    hypothyroidism    Past Surgical History:  Procedure Laterality Date   ADENOIDECTOMY     BALLOON DILATION N/A 12/19/2019   Procedure: BALLOON DILATION;  Surgeon: Lanelle Bal, DO;  [facility];  Service: Endoscopy;  Laterality: N/A;   BIOPSY  12/19/2019   Procedure: BIOPSY;  Surgeon: Lanelle Bal, DO;  [facility];  Service: Endoscopy;;   CHOLECYSTECTOMY  01/27/2012   Procedure: LAPAROSCOPIC CHOLECYSTECTOMY;  Surgeon: Fabio Bering, MD;  Location: [facility];  Service: General;  Laterality: N/A;   ESOPHAGOGASTRODUODENOSCOPY (EGD) WITH PROPOFOL  N/A 12/19/2019   Procedure: ESOPHAGOGASTRODUODENOSCOPY (EGD) WITH PROPOFOL;  Surgeon: Lanelle Bal, DO;  [facility];  Service: Endoscopy;  Laterality: N/A;  3:00pm   HERNIA REPAIR Left    LIH   HYDRADENITIS EXCISION  10/02/2011   Procedure: EXCISION HYDRADENITIS AXILLA;  Surgeon: Fabio Bering, MD;  Location: [facility];  Service: General;  Laterality: Right;  Right Axillary Dissection   HYDRADENITIS EXCISION Left 10/19/2013   Procedure: INCISION OF HIDRADENITIS SUPPURATIVA LEFT AXILLA;  Surgeon: Marlane Hatcher, MD;  Location: [facility];  Service: General;  Laterality: Left;   HYDRADENITIS EXCISION Left 09/09/2018   Procedure: EXCISION HIDRADENITIS AXILLA, LEFT;  Surgeon: Franky Macho, MD;  Location: [facility];  Service: General;  Laterality: Left;   TONSILLECTOMY      Family History  Problem Relation Age of Onset   Diabetes Mother    Hypertension Mother    Hyperlipidemia Mother    Neuropathy Mother    Diabetes Maternal Grandmother    Hypertension Maternal Grandmother    Heart disease Maternal Grandmother        CHF   COPD Maternal Grandmother    Cirrhosis Maternal Grandmother    Anxiety disorder Maternal Grandmother    Cancer Maternal Grandfather        lung   Diabetes Paternal Grandmother    Diabetes Paternal Grandfather    Aneurysm Paternal Grandfather    Anxiety disorder Paternal Grandfather    Drug abuse Father    ADD / ADHD  Brother    Stroke Other    Anxiety disorder Maternal Uncle    Anxiety disorder Paternal Uncle    Depression Paternal Uncle    Anxiety disorder Maternal Aunt     Social History   Tobacco Use   Smoking status: Every Day    Packs/day: 0.25    Years: 7.00    Pack years: 1.75    Types: Cigarettes   Smokeless tobacco: Never  Vaping Use   Vaping Use: Never used  Substance Use Topics   Alcohol use: Yes    Comment: social use    Drug use: No    No current facility-administered medications for this encounter.  Current Outpatient  Medications:    chlorpheniramine-HYDROcodone (TUSSIONEX PENNKINETIC ER) 10-8 MG/5ML SUER, Take 5 mLs by mouth every 12 (twelve) hours as needed for cough., Disp: 60 mL, Rfl: 0   doxycycline (VIBRAMYCIN) 100 MG capsule, Take 1 capsule (100 mg total) by mouth 2 (two) times daily for 10 days., Disp: 20 capsule, Rfl: 0   ibuprofen (ADVIL) 600 MG tablet, Take 1 tablet (600 mg total) by mouth every 6 (six) hours as needed., Disp: 30 tablet, Rfl: 0   acetaminophen (TYLENOL) 325 MG tablet, Take 650 mg by mouth every 6 (six) hours as needed for moderate pain or headache., Disp: , Rfl:    amphetamine-dextroamphetamine (ADDERALL) 20 MG tablet, Take 1 tablet (20 mg total) by mouth 2 (two) times daily., Disp: 60 tablet, Rfl: 0   amphetamine-dextroamphetamine (ADDERALL) 20 MG tablet, Take 1 tablet (20 mg total) by mouth 2 (two) times daily., Disp: 90 tablet, Rfl: 0   amphetamine-dextroamphetamine (ADDERALL) 20 MG tablet, Take 1 tablet (20 mg total) by mouth 2 (two) times daily., Disp: 60 tablet, Rfl: 0   benzonatate (TESSALON) 100 MG capsule, Take 1 capsule (100 mg total) by mouth 3 (three) times daily as needed for cough., Disp: 30 capsule, Rfl: 0   cetirizine (ZYRTEC ALLERGY) 10 MG tablet, Take 1 tablet (10 mg total) by mouth daily. (Patient taking differently: Take 10 mg by mouth daily as needed for allergies. ), Disp: 30 tablet, Rfl: 0   clonazePAM (KLONOPIN) 1 MG tablet, TAKE 1 TABLET(1 MG) BY MOUTH TWICE DAILY AS NEEDED FOR ANXIETY, Disp: 60 tablet, Rfl: 2   escitalopram (LEXAPRO) 20 MG tablet, Take 1 tablet (20 mg total) by mouth daily., Disp: 90 tablet, Rfl: 2   fluticasone (FLONASE) 50 MCG/ACT nasal spray, Place 1 spray into both nostrils daily for 14 days., Disp: 16 g, Rfl: 0   NUVARING 0.12-0.015 MG/24HR vaginal ring, INSERT VAGINALLY AND LEAVE IN. PLACE FOR 3 CONSECUTIVE WEEKS, THEN REMOVE FOR 1 WEEK (Patient taking differently: Place 1 each vaginally See admin instructions. PLACE FOR 3 CONSECUTIVE WEEKS,  THEN REMOVE FOR 1 WEEK), Disp: 1 each, Rfl: 10   omeprazole (PRILOSEC) 20 MG capsule, Take 1 capsule (20 mg total) by mouth 2 (two) times daily., Disp: 60 capsule, Rfl: 5   ondansetron (ZOFRAN ODT) 4 MG disintegrating tablet, Take 1 tablet (4 mg total) by mouth every 8 (eight) hours as needed for nausea or vomiting., Disp: 20 tablet, Rfl: 0  Allergies  Allergen Reactions   Bactrim Hives and Itching   Ciprofloxacin Hives and Itching    CRNA Discontinued preop antibiotic  IVPB  Cipro in OR after developed itching and hives left arm.  Received Benadryl with relief noted. Dr.Gonzalez and Dr. Leticia Penna informed. DDallasRN   Other Other (See Comments)    [place] causes a hives, swelling, itching,  and anaphylaxis.    Penicillins Hives and Itching    Has patient had a PCN reaction causing immediate rash, facial/tongue/throat swelling, SOB or lightheadedness with hypotension: Yes Has patient had a PCN reaction causing severe rash involving mucus membranes or skin necrosis: No Has patient had a PCN reaction that required hospitalization No Has patient had a PCN reaction occurring within the last 10 years: No If all of the above answers are "NO", then may proceed with Cephalosporin use.    Percocet [Oxycodone-Acetaminophen] Other (See Comments)    Face flushes   Vancomycin Itching     ROS  As noted in HPI.   Physical Exam  BP (!) 149/98 (BP Location: Right Arm)   Pulse 87   Temp 98.2 F (36.8 C) (Oral)   Resp 18   SpO2 98%   Constitutional: Well developed, well nourished, no acute distress Eyes: PERRL, EOMI, conjunctiva normal bilaterally HENT: Normocephalic, atraumatic,mucus membranes moist.  TMs normal bilaterally.  Positive nasal congestion.  Erythematous, swollen turbinates.  Positive right maxillary sinus tenderness.  Tonsils surgically absent.  Positive cobblestoning and postnasal drip. Respiratory: Clear to auscultation bilaterally, no rales, no wheezing, no rhonchi Cardiovascular:  Normal rate and rhythm, no murmurs, no gallops, no rubs GI: nondistended skin: No rash, skin intact Musculoskeletal: No edema, Neurologic: Alert & oriented x 3, CN III-XII grossly intact, no motor deficits, sensation grossly intact Psychiatric: Speech and behavior appropriate   ED Course   Medications - No data to display  Orders Placed This Encounter  Procedures   Covid-19, Flu A+B ([facility])    Standing Status:   Standing    Number of Occurrences:   1   POCT Influenza A/B    Standing Status:   Standing    Number of Occurrences:   1   Results for orders placed or performed during the hospital encounter of 11/05/20 (from the past 24 hour(s))  POCT Influenza A/B     Status: None   Collection Time: 11/05/20  2:56 PM  Result Value Ref Range   Influenza A, POC Negative Negative   Influenza B, POC Negative Negative   No results found.  ED Clinical Impression  1. Acute upper respiratory infection   2. Exposure to COVID-19 virus   3. Encounter for laboratory testing for COVID-19 virus   4. Acute non-recurrent maxillary sinusitis      ED Assessment/Plan   [place] Narcotic database reviewed for this patient, and feel that the risk/benefit ratio today is favorable for proceeding with a prescription for controlled substance.  Rapid flu negative.  Sending off COVID, flu PCR.  If COVID is positive, will prescribe Molnupiravir due to BMI above 30.  If flu is positive, will prescribe Tamiflu.  In the meantime, she is to start Flonase, saline nasal irrigation, discontinue DayQuil/NyQuil, start Tylenol/ibuprofen, Mucinex D, Tussionex.  States that she has tolerated hydrocodone in the past without any problems.  Wait-and-see prescription of doxycycline if COVID and flu are negative to treat a presumed right maxillary sinusitis.  COVID, flu pending at the time of signing of this note.  Work note.  Discussed labs, MDM, treatment plan, and plan for follow-up with patient Discussed sn/sx that  should prompt return to the ED. patient agrees with plan.   Meds ordered this encounter  Medications   chlorpheniramine-HYDROcodone (TUSSIONEX PENNKINETIC ER) 10-8 MG/5ML SUER    Sig: Take 5 mLs by mouth every 12 (twelve) hours as needed for cough.    Dispense:  60 mL  Refill:  0   ibuprofen (ADVIL) 600 MG tablet    Sig: Take 1 tablet (600 mg total) by mouth every 6 (six) hours as needed.    Dispense:  30 tablet    Refill:  0   doxycycline (VIBRAMYCIN) 100 MG capsule    Sig: Take 1 capsule (100 mg total) by mouth 2 (two) times daily for 10 days.    Dispense:  20 capsule    Refill:  0      *This clinic note was created using Scientist, clinical (histocompatibility and immunogenetics). Therefore, there may be occasional mistakes despite careful proofreading. ?    Domenick Gong, MD 11/06/20 [phone]

## 2020-11-05 NOTE — ED Triage Notes (Signed)
Cough and nasal congestion, bilateral ear pain since yesterday.  At home covid test was negative.

## 2020-11-06 LAB — COVID-19, FLU A+B NAA
Influenza A, NAA: NOT DETECTED
Influenza B, NAA: NOT DETECTED
SARS-CoV-2, NAA: NOT DETECTED

## 2020-11-11 ENCOUNTER — Other Ambulatory Visit (HOSPITAL_COMMUNITY): Payer: Self-pay | Admitting: Internal Medicine

## 2020-11-11 DIAGNOSIS — M79672 Pain in left foot: Secondary | ICD-10-CM

## 2020-11-11 DIAGNOSIS — M79671 Pain in right foot: Secondary | ICD-10-CM

## 2020-11-13 ENCOUNTER — Ambulatory Visit (HOSPITAL_COMMUNITY)
Admission: RE | Admit: 2020-11-13 | Discharge: 2020-11-13 | Disposition: A | Discharge status: Home / Self Care | Payer: 59 | Source: Ambulatory Visit | Attending: Internal Medicine | Admitting: Internal Medicine

## 2020-11-13 ENCOUNTER — Other Ambulatory Visit: Payer: Self-pay

## 2020-11-13 DIAGNOSIS — M79671 Pain in right foot: Secondary | ICD-10-CM | POA: Diagnosis present

## 2020-11-13 DIAGNOSIS — M79672 Pain in left foot: Secondary | ICD-10-CM | POA: Diagnosis not present

## 2020-11-29 ENCOUNTER — Encounter (HOSPITAL_COMMUNITY): Payer: Self-pay | Admitting: *Deleted

## 2020-11-29 ENCOUNTER — Ambulatory Visit
Admission: EM | Admit: 2020-11-29 | Discharge: 2020-11-29 | Disposition: A | Discharge status: Other Acute Inpatient Hospital | Payer: 59

## 2020-11-29 ENCOUNTER — Other Ambulatory Visit: Payer: Self-pay

## 2020-11-29 ENCOUNTER — Emergency Department (HOSPITAL_COMMUNITY): Payer: 59

## 2020-11-29 ENCOUNTER — Emergency Department (HOSPITAL_COMMUNITY)
Admission: EM | Admit: 2020-11-29 | Discharge: 2020-11-29 | Disposition: A | Discharge status: Home / Self Care | Payer: 59 | Attending: Emergency Medicine | Admitting: Emergency Medicine

## 2020-11-29 DIAGNOSIS — F17210 Nicotine dependence, cigarettes, uncomplicated: Secondary | ICD-10-CM | POA: Diagnosis not present

## 2020-11-29 DIAGNOSIS — Z7952 Long term (current) use of systemic steroids: Secondary | ICD-10-CM | POA: Insufficient documentation

## 2020-11-29 DIAGNOSIS — E039 Hypothyroidism, unspecified: Secondary | ICD-10-CM | POA: Diagnosis not present

## 2020-11-29 DIAGNOSIS — R519 Headache, unspecified: Secondary | ICD-10-CM

## 2020-11-29 DIAGNOSIS — J3489 Other specified disorders of nose and nasal sinuses: Secondary | ICD-10-CM | POA: Diagnosis not present

## 2020-11-29 DIAGNOSIS — H539 Unspecified visual disturbance: Secondary | ICD-10-CM | POA: Diagnosis not present

## 2020-11-29 DIAGNOSIS — R42 Dizziness and giddiness: Secondary | ICD-10-CM | POA: Insufficient documentation

## 2020-11-29 DIAGNOSIS — Z20822 Contact with and (suspected) exposure to covid-19: Secondary | ICD-10-CM | POA: Diagnosis not present

## 2020-11-29 LAB — CBC WITH DIFFERENTIAL/PLATELET
Basophils Absolute: 0 10*3/uL (ref 0.0–0.1)
Basophils Relative: 0 %
Eosinophils Absolute: 0.3 10*3/uL (ref 0.0–0.5)
Eosinophils Relative: 3 %
HCT: 42.8 % (ref 36.0–46.0)
Hemoglobin: 14.4 g/dL (ref 12.0–15.0)
Lymphocytes Relative: 37 %
Lymphs Abs: 4.3 10*3/uL — ABNORMAL HIGH (ref 0.7–4.0)
MCH: 31.2 pg (ref 26.0–34.0)
MCHC: 33.6 g/dL (ref 30.0–36.0)
MCV: 92.6 fL (ref 80.0–100.0)
Metamyelocytes Relative: 2 %
Monocytes Absolute: 0.5 10*3/uL (ref 0.1–1.0)
Monocytes Relative: 4 %
Neutro Abs: 6.2 10*3/uL (ref 1.7–7.7)
Neutrophils Relative %: 54 %
Platelets: 313 10*3/uL (ref 150–400)
RBC: 4.62 MIL/uL (ref 3.87–5.11)
RDW: 13.1 % (ref 11.5–15.5)
WBC: 11.5 10*3/uL — ABNORMAL HIGH (ref 4.0–10.5)
nRBC: 0 % (ref 0.0–0.2)

## 2020-11-29 LAB — URINALYSIS, ROUTINE W REFLEX MICROSCOPIC
Bilirubin Urine: NEGATIVE
Glucose, UA: NEGATIVE mg/dL
Ketones, ur: NEGATIVE mg/dL
Nitrite: NEGATIVE
Protein, ur: NEGATIVE mg/dL
Specific Gravity, Urine: 1.012 (ref 1.005–1.030)
pH: 5 (ref 5.0–8.0)

## 2020-11-29 LAB — COMPREHENSIVE METABOLIC PANEL
ALT: 15 U/L (ref 0–44)
AST: 18 U/L (ref 15–41)
Albumin: 3.7 g/dL (ref 3.5–5.0)
Alkaline Phosphatase: 75 U/L (ref 38–126)
Anion gap: 9 (ref 5–15)
BUN: 11 mg/dL (ref 6–20)
CO2: 24 mmol/L (ref 22–32)
Calcium: 8.8 mg/dL — ABNORMAL LOW (ref 8.9–10.3)
Chloride: 104 mmol/L (ref 98–111)
Creatinine, Ser: 0.64 mg/dL (ref 0.44–1.00)
GFR, Estimated: >60 mL/min (ref >60)
Glucose, Bld: 99 mg/dL (ref 70–99)
Potassium: 3.5 mmol/L (ref 3.5–5.1)
Sodium: 137 mmol/L (ref 135–145)
Total Bilirubin: 0.2 mg/dL — ABNORMAL LOW (ref 0.3–1.2)
Total Protein: 6.9 g/dL (ref 6.5–8.1)

## 2020-11-29 LAB — RESP PANEL BY RT-PCR (FLU A&B, COVID) ARPGX2
Influenza A by PCR: NEGATIVE
Influenza B by PCR: NEGATIVE
SARS Coronavirus 2 by RT PCR: NEGATIVE

## 2020-11-29 MED ORDER — FLUTICASONE PROPIONATE 50 MCG/ACT NA SUSP: 1.0000 | Freq: Every day | NASAL | 2 refills | Status: DC

## 2020-11-29 MED ORDER — IBUPROFEN 800 MG PO TABS: 800.0000 mg | ORAL_TABLET | Freq: Three times a day (TID) | ORAL | 0 refills | Status: DC

## 2020-11-29 MED ORDER — SALINE SPRAY 0.65 % NA SOLN: 1.0000 | NASAL | 0 refills | Status: DC | PRN

## 2020-11-29 MED ORDER — SODIUM CHLORIDE 0.9 % IV BOLUS
1000.0000 mL | Freq: Once | INTRAVENOUS | Status: AC
  Administered 2020-11-29: 1000 mL via INTRAVENOUS

## 2020-11-29 MED ORDER — KETOROLAC TROMETHAMINE 30 MG/ML IJ SOLN
30.0000 mg | Freq: Once | INTRAMUSCULAR | Status: AC
  Administered 2020-11-29: 30 mg via INTRAVENOUS
  Filled 2020-11-29: qty 1

## 2020-11-29 MED ORDER — MECLIZINE HCL 25 MG PO TABS: 25.0000 mg | ORAL_TABLET | Freq: Three times a day (TID) | ORAL | 0 refills | Status: DC | PRN

## 2020-11-29 NOTE — ED Notes (Signed)
Patient transported to CT 

## 2020-11-29 NOTE — ED Provider Notes (Signed)
[facility]    CSN: [id number] Arrival date & time: 11/29/20  0859      History   Chief Complaint Chief Complaint  Patient presents with   Dizziness   Nausea   Headache    HPI Kathleen Velasquez is a 30 y.o. female.   Patient presenting today with 5 to 6-day history of significant frontal headache, pressure behind the eyes, dizzy spells off and on.  She states she had a very scary episode yesterday evening after masturbating where she had the worst headache of her life lasting several minutes, complete loss of vision, severe dizziness and nausea.  Has had persistent pressure and headache ever since.  Vision has returned fully and no confusion or altered mental status at this time.  Her blood pressures have been intermittently mildly elevated to a max of 160/100 since onset of her headaches.  Has a family history of CVA and cerebral aneurysm.  No personal history of cardiovascular or neurologic disease.   Past Medical History:  Diagnosis Date   Abscess    right buttocks/thigh   Anxiety    Depression    DUB (dysfunctional uterine bleeding) 05/25/2012   Hydradenitis    Hypothyroidism    Polycystic ovaries    Thyroid disease    hypothyroidism    Patient Active Problem List   Diagnosis Date Noted   Left axillary hidradenitis    Anovulatory (dysfunctional uterine) bleeding 06/07/2012   DUB (dysfunctional uterine bleeding) 05/25/2012   FOOT PAIN, CHRONIC 05/09/2007    Past Surgical History:  Procedure Laterality Date   ADENOIDECTOMY     BALLOON DILATION N/A 12/19/2019   Procedure: BALLOON DILATION;  Surgeon: Lanelle Bal, DO;  [facility];  Service: Endoscopy;  Laterality: N/A;   BIOPSY  12/19/2019   Procedure: BIOPSY;  Surgeon: Lanelle Bal, DO;  [facility];  Service: Endoscopy;;   CHOLECYSTECTOMY  01/27/2012   Procedure: LAPAROSCOPIC CHOLECYSTECTOMY;  Surgeon: Fabio Bering, MD;  Location: [facility];  Service: General;   Laterality: N/A;   ESOPHAGOGASTRODUODENOSCOPY (EGD) WITH PROPOFOL N/A 12/19/2019   Procedure: ESOPHAGOGASTRODUODENOSCOPY (EGD) WITH PROPOFOL;  Surgeon: Lanelle Bal, DO;  [facility];  Service: Endoscopy;  Laterality: N/A;  3:00pm   HERNIA REPAIR Left    LIH   HYDRADENITIS EXCISION  10/02/2011   Procedure: EXCISION HYDRADENITIS AXILLA;  Surgeon: Fabio Bering, MD;  Location: [facility];  Service: General;  Laterality: Right;  Right Axillary Dissection   HYDRADENITIS EXCISION Left 10/19/2013   Procedure: INCISION OF HIDRADENITIS SUPPURATIVA LEFT AXILLA;  Surgeon: Marlane Hatcher, MD;  Location: [facility];  Service: General;  Laterality: Left;   HYDRADENITIS EXCISION Left 09/09/2018   Procedure: EXCISION HIDRADENITIS AXILLA, LEFT;  Surgeon: Franky Macho, MD;  Location: [facility];  Service: General;  Laterality: Left;   TONSILLECTOMY      OB History     Gravida  0   Para      Term      Preterm      AB      Living  0      SAB      IAB      Ectopic      Multiple      Live Births               Home Medications    Prior to Admission medications   Medication Sig Start Date End Date Taking? Authorizing Provider  acetaminophen (  TYLENOL) 325 MG tablet Take 650 mg by mouth every 6 (six) hours as needed for moderate pain or headache.    [provider]  amphetamine-dextroamphetamine (ADDERALL) 20 MG tablet Take 1 tablet (20 mg total) by mouth 2 (two) times daily. 09/30/20 09/30/21  Myrlene Broker, MD  amphetamine-dextroamphetamine (ADDERALL) 20 MG tablet Take 1 tablet (20 mg total) by mouth 2 (two) times daily. Patient not taking: Reported on 11/29/2020 09/30/20 09/30/21  Myrlene Broker, MD  amphetamine-dextroamphetamine (ADDERALL) 20 MG tablet Take 1 tablet (20 mg total) by mouth 2 (two) times daily. Patient not taking: Reported on 11/29/2020 09/30/20 09/30/21  Myrlene Broker, MD  benzonatate (TESSALON) 100 MG capsule Take 1 capsule (100 mg total) by mouth 3  (three) times daily as needed for cough. Patient not taking: Reported on 11/29/2020 12/25/19   Durward Parcel, FNP  cetirizine (ZYRTEC ALLERGY) 10 MG tablet Take 1 tablet (10 mg total) by mouth daily. Patient taking differently: Take 10 mg by mouth daily as needed for allergies.  08/31/19   Avegno, Zachery Dakins, FNP  chlorpheniramine-HYDROcodone (TUSSIONEX PENNKINETIC ER) 10-8 MG/5ML SUER Take 5 mLs by mouth every 12 (twelve) hours as needed for cough. Patient not taking: Reported on 11/29/2020 11/05/20   Domenick Gong, MD  clonazePAM (KLONOPIN) 1 MG tablet TAKE 1 TABLET(1 MG) BY MOUTH TWICE DAILY AS NEEDED FOR ANXIETY 09/30/20   Myrlene Broker, MD  escitalopram (LEXAPRO) 20 MG tablet Take 1 tablet (20 mg total) by mouth daily. 09/30/20 09/30/21  Myrlene Broker, MD  fluticasone (FLONASE) 50 MCG/ACT nasal spray Place 1 spray into both nostrils daily for 14 days. Patient not taking: Reported on 11/29/2020 12/25/19 01/08/20  Durward Parcel, FNP  ibuprofen (ADVIL) 600 MG tablet Take 1 tablet (600 mg total) by mouth every 6 (six) hours as needed. 11/05/20   Domenick Gong, MD  NUVARING 0.12-0.015 MG/24HR vaginal ring INSERT VAGINALLY AND LEAVE IN. PLACE FOR 3 CONSECUTIVE WEEKS, THEN REMOVE FOR 1 WEEK Patient taking differently: Place 1 each vaginally See admin instructions. PLACE FOR 3 CONSECUTIVE WEEKS, THEN REMOVE FOR 1 WEEK 11/28/15   Constant, Peggy, MD  omeprazole (PRILOSEC) 20 MG capsule Take 1 capsule (20 mg total) by mouth 2 (two) times daily. 12/19/19 06/16/20  Lanelle Bal, DO  ondansetron (ZOFRAN ODT) 4 MG disintegrating tablet Take 1 tablet (4 mg total) by mouth every 8 (eight) hours as needed for nausea or vomiting. Patient not taking: Reported on 11/29/2020 12/25/19   Durward Parcel, FNP    Family History Family History  Problem Relation Age of Onset   Diabetes Mother    Hypertension Mother    Hyperlipidemia Mother    Neuropathy Mother    Diabetes Maternal  Grandmother    Hypertension Maternal Grandmother    Heart disease Maternal Grandmother        CHF   COPD Maternal Grandmother    Cirrhosis Maternal Grandmother    Anxiety disorder Maternal Grandmother    Cancer Maternal Grandfather        lung   Diabetes Paternal Grandmother    Diabetes Paternal Grandfather    Aneurysm Paternal Grandfather    Anxiety disorder Paternal Grandfather    Drug abuse Father    ADD / ADHD Brother    Stroke Other    Anxiety disorder Maternal Uncle    Anxiety disorder Paternal Uncle    Depression Paternal Uncle    Anxiety disorder Maternal Aunt     Social History  Social History   Tobacco Use   Smoking status: Every Day    Packs/day: 0.25    Years: 7.00    Pack years: 1.75    Types: Cigarettes   Smokeless tobacco: Never  Vaping Use   Vaping Use: Never used  Substance Use Topics   Alcohol use: Yes    Comment: social use    Drug use: No     Allergies   Bactrim, Ciprofloxacin, Other, Penicillins, Percocet [oxycodone-acetaminophen], and Vancomycin   Review of Systems Review of Systems Per HPI  Physical Exam Triage Vital Signs ED Triage Vitals [11/29/20 1002]  Enc Vitals Group     BP 135/84     Pulse Rate 73     Resp 14     Temp 99.1 F (37.3 C)     Temp Source Oral     SpO2 97 %     Weight      Height      Head Circumference      Peak Flow      Pain Score 4     Pain Loc      Pain Edu?      Excl. in GC?    No data found.  Updated Vital Signs BP 135/84 (BP Location: Right Arm)   Pulse 73   Temp 99.1 F (37.3 C) (Oral)   Resp 14   SpO2 97%   Visual Acuity Right Eye Distance:   Left Eye Distance:   Bilateral Distance:    Right Eye Near:   Left Eye Near:    Bilateral Near:     Physical Exam Vitals and nursing note reviewed.  Constitutional:      Appearance: Normal appearance. She is not ill-appearing.  HENT:     Head: Atraumatic.     Mouth/Throat:     Mouth: Mucous membranes are moist.  Eyes:      Extraocular Movements: Extraocular movements intact.     Conjunctiva/sclera: Conjunctivae normal.  Cardiovascular:     Rate and Rhythm: Normal rate and regular rhythm.     Heart sounds: Normal heart sounds.  Pulmonary:     Effort: Pulmonary effort is normal.     Breath sounds: Normal breath sounds.  Musculoskeletal:        General: Normal range of motion.     Cervical back: Normal range of motion and neck supple.  Skin:    General: Skin is warm and dry.  Neurological:     Mental Status: She is alert and oriented to person, place, and time.     Motor: No weakness.     Gait: Gait normal.     Comments: No gross neurologic deficit on brief exam  Psychiatric:        Mood and Affect: Mood normal.        Thought Content: Thought content normal.        Judgment: Judgment normal.   -exam abbreviated today as the decision was already made to go to the emergency department   UC Treatments / Results  Labs (all labs ordered are listed, but only abnormal results are displayed) Labs Reviewed - No data to display  EKG   Radiology No results found.  Procedures Procedures (including critical care time)  Medications Ordered in UC Medications - No data to display  Initial Impression / Assessment and Plan / UC Course  I have reviewed the triage vital signs and the nursing notes.  Pertinent labs & imaging results that were available during  my care of the patient were reviewed by me and considered in my medical decision making (see chart for details).    Patient with worse headache of life, exacerbated by sexual activity with family history of aneurysm and CVA.  Given the severity of her symptoms and risk of life-threatening illness, recommended she go to the emergency department for further evaluation which she readily agrees to.  She wishes to go via private vehicle and declines EMS transport.  She will call somebody to help escort her to the hospital.  She is currently hemodynamically  stable for transport.  Final Clinical Impressions(s) / UC Diagnoses   Final diagnoses:  Severe headache  Transient vision disturbance   Discharge Instructions   None    ED Prescriptions   None    PDMP not reviewed this encounter.   Particia Nearing, [place] 11/29/20 1132

## 2020-11-29 NOTE — ED Provider Notes (Signed)
[facility] EMERGENCY DEPARTMENT Provider Note   CSN: [id number] Arrival date & time: 11/29/20  1101     History Chief Complaint  Patient presents with   Headache    Kathleen Velasquez is a 30 y.o. female.  Pt presents to the ED today with dizziness, headache and sinus congestion.  Pt said she has had sx for about a week.  She had a severe headache last night after an orgasm and lost vision for possibly several minutes.  Pt went to UC this morning and was sent here for further eval.   Bp at home was elevated.        Past Medical History:  Diagnosis Date   Abscess    right buttocks/thigh   Anxiety    Depression    DUB (dysfunctional uterine bleeding) 05/25/2012   Hydradenitis    Hypothyroidism    Polycystic ovaries    Thyroid disease    hypothyroidism    Patient Active Problem List   Diagnosis Date Noted   Left axillary hidradenitis    Anovulatory (dysfunctional uterine) bleeding 06/07/2012   DUB (dysfunctional uterine bleeding) 05/25/2012   FOOT PAIN, CHRONIC 05/09/2007    Past Surgical History:  Procedure Laterality Date   ADENOIDECTOMY     BALLOON DILATION N/A 12/19/2019   Procedure: BALLOON DILATION;  Surgeon: Lanelle Bal, DO;  [facility]: [facility];  Service: Endoscopy;  Laterality: N/A;   BIOPSY  12/19/2019   Procedure: BIOPSY;  Surgeon: Lanelle Bal, DO;  [facility]: [facility];  Service: Endoscopy;;   CHOLECYSTECTOMY  01/27/2012   Procedure: LAPAROSCOPIC CHOLECYSTECTOMY;  Surgeon: Fabio Bering, MD;  Location: [facility] ORS;  Service: General;  Laterality: N/A;   ESOPHAGOGASTRODUODENOSCOPY (EGD) WITH PROPOFOL N/A 12/19/2019   Procedure: ESOPHAGOGASTRODUODENOSCOPY (EGD) WITH PROPOFOL;  Surgeon: Lanelle Bal, DO;  [facility]: [facility];  Service: Endoscopy;  Laterality: N/A;  3:00pm   HERNIA REPAIR Left    LIH   HYDRADENITIS EXCISION  10/02/2011   Procedure: EXCISION HYDRADENITIS AXILLA;  Surgeon: Fabio Bering, MD;  Location: [facility] ORS;  Service:  General;  Laterality: Right;  Right Axillary Dissection   HYDRADENITIS EXCISION Left 10/19/2013   Procedure: INCISION OF HIDRADENITIS SUPPURATIVA LEFT AXILLA;  Surgeon: Marlane Hatcher, MD;  Location: [facility] OR[facility];  Service: General;  Laterality: Left;   HYDRADENITIS EXCISION Left 09/09/2018   Procedure: EXCISION HIDRADENITIS AXILLA, LEFT;  Surgeon: Franky Macho, MD;  Location: [facility] ORS;  Service: General;  Laterality: Left;   TONSILLECTOMY       OB History     Gravida  0   Para      Term      Preterm      AB      Living  0      SAB      IAB      Ectopic      Multiple      Live Births              Family History  Problem Relation Age of Onset   Diabetes Mother    Hypertension Mother    Hyperlipidemia Mother    Neuropathy Mother    Diabetes Maternal Grandmother    Hypertension Maternal Grandmother    Heart disease Maternal Grandmother        CHF   COPD Maternal Grandmother    Cirrhosis Maternal Grandmother    Anxiety disorder Maternal Grandmother    Cancer Maternal Grandfather  lung   Diabetes Paternal Grandmother    Diabetes Paternal Grandfather    Aneurysm Paternal Grandfather    Anxiety disorder Paternal Grandfather    Drug abuse Father    ADD / ADHD Brother    Stroke Other    Anxiety disorder Maternal Uncle    Anxiety disorder Paternal Uncle    Depression Paternal Uncle    Anxiety disorder Maternal Aunt     Social History   Tobacco Use   Smoking status: Every Day    Packs/day: 0.25    Years: 7.00    Pack years: 1.75    Types: Cigarettes   Smokeless tobacco: Never  Vaping Use   Vaping Use: Never used  Substance Use Topics   Alcohol use: Yes    Comment: social use    Drug use: No    Home Medications Prior to Admission medications   Medication Sig Start Date End Date Taking? Authorizing Provider  fluticasone (FLONASE) 50 MCG/ACT nasal spray Place 1 spray into both nostrils daily. 11/29/20  Yes Jacalyn Lefevre, MD  ibuprofen  (ADVIL) 800 MG tablet Take 1 tablet (800 mg total) by mouth 3 (three) times daily. 11/29/20  Yes Jacalyn Lefevre, MD  meclizine (ANTIVERT) 25 MG tablet Take 1 tablet (25 mg total) by mouth 3 (three) times daily as needed for dizziness. 11/29/20  Yes Jacalyn Lefevre, MD  sodium chloride (OCEAN) 0.65 % SOLN nasal spray Place 1 spray into both nostrils as needed for congestion. 11/29/20  Yes Jacalyn Lefevre, MD  acetaminophen (TYLENOL) 325 MG tablet Take 650 mg by mouth every 6 (six) hours as needed for moderate pain or headache.    [provider]  amphetamine-dextroamphetamine (ADDERALL) 20 MG tablet Take 1 tablet (20 mg total) by mouth 2 (two) times daily. 09/30/20 09/30/21  Myrlene Broker, MD  amphetamine-dextroamphetamine (ADDERALL) 20 MG tablet Take 1 tablet (20 mg total) by mouth 2 (two) times daily. Patient not taking: Reported on 11/29/2020 09/30/20 09/30/21  Myrlene Broker, MD  amphetamine-dextroamphetamine (ADDERALL) 20 MG tablet Take 1 tablet (20 mg total) by mouth 2 (two) times daily. Patient not taking: Reported on 11/29/2020 09/30/20 09/30/21  Myrlene Broker, MD  benzonatate (TESSALON) 100 MG capsule Take 1 capsule (100 mg total) by mouth 3 (three) times daily as needed for cough. Patient not taking: Reported on 11/29/2020 12/25/19   Durward Parcel, FNP  cetirizine (ZYRTEC ALLERGY) 10 MG tablet Take 1 tablet (10 mg total) by mouth daily. Patient taking differently: Take 10 mg by mouth daily as needed for allergies.  08/31/19   Avegno, Zachery Dakins, FNP  chlorpheniramine-HYDROcodone (TUSSIONEX PENNKINETIC ER) 10-8 MG/5ML SUER Take 5 mLs by mouth every 12 (twelve) hours as needed for cough. Patient not taking: Reported on 11/29/2020 11/05/20   Domenick Gong, MD  clonazePAM (KLONOPIN) 1 MG tablet TAKE 1 TABLET(1 MG) BY MOUTH TWICE DAILY AS NEEDED FOR ANXIETY 09/30/20   Myrlene Broker, MD  escitalopram (LEXAPRO) 20 MG tablet Take 1 tablet (20 mg total) by mouth daily. 09/30/20  09/30/21  Myrlene Broker, MD  NUVARING 0.12-0.015 MG/24HR vaginal ring INSERT VAGINALLY AND LEAVE IN. PLACE FOR 3 CONSECUTIVE WEEKS, THEN REMOVE FOR 1 WEEK Patient taking differently: Place 1 each vaginally See admin instructions. PLACE FOR 3 CONSECUTIVE WEEKS, THEN REMOVE FOR 1 WEEK 11/28/15   Constant, Peggy, MD  omeprazole (PRILOSEC) 20 MG capsule Take 1 capsule (20 mg total) by mouth 2 (two) times daily. 12/19/19 06/16/20  Lanelle Bal, DO  ondansetron (ZOFRAN ODT) 4 MG disintegrating tablet Take 1 tablet (4 mg total) by mouth every 8 (eight) hours as needed for nausea or vomiting. Patient not taking: Reported on 11/29/2020 12/25/19   Durward Parcel, FNP    Allergies    Bactrim, Ciprofloxacin, Other, Penicillins, Percocet [oxycodone-acetaminophen], and Vancomycin  Review of Systems   Review of Systems  HENT:  Positive for congestion.   Neurological:  Positive for headaches.  All other systems reviewed and are negative.  Physical Exam Updated Vital Signs BP 130/72   Pulse 75   Temp 98.7 F (37.1 C) (Oral)   Resp 18   Ht 5\' 4"  (1.626 m)   Wt 112.7 kg   SpO2 100%   BMI 42.64 kg/m   Physical Exam Vitals and nursing note reviewed.  Constitutional:      Appearance: She is well-developed.  HENT:     Head: Normocephalic and atraumatic.     Mouth/Throat:     Mouth: Mucous membranes are moist.     Pharynx: Oropharynx is clear.  Eyes:     Extraocular Movements: Extraocular movements intact.     Pupils: Pupils are equal, round, and reactive to light.  Cardiovascular:     Rate and Rhythm: Normal rate and regular rhythm.     Heart sounds: Normal heart sounds.  Pulmonary:     Effort: Pulmonary effort is normal.     Breath sounds: Normal breath sounds.  Abdominal:     General: Bowel sounds are normal.     Palpations: Abdomen is soft.  Musculoskeletal:        General: Normal range of motion.     Cervical back: Normal range of motion and neck supple.  Skin:    General:  Skin is warm.  Neurological:     Mental Status: She is alert and oriented to person, place, and time.  Psychiatric:        Mood and Affect: Mood normal.        Speech: Speech normal.        Behavior: Behavior normal.    ED Results / Procedures / Treatments   Labs (all labs ordered are listed, but only abnormal results are displayed) Labs Reviewed  COMPREHENSIVE METABOLIC PANEL - Abnormal; Notable for the following components:      Result Value   Calcium 8.8 (*)    Total Bilirubin 0.2 (*)    All other components within normal limits  CBC WITH DIFFERENTIAL/PLATELET - Abnormal; Notable for the following components:   WBC 11.5 (*)    Lymphs Abs 4.3 (*)    All other components within normal limits  URINALYSIS, ROUTINE W REFLEX MICROSCOPIC - Abnormal; Notable for the following components:   APPearance HAZY (*)    Hgb urine dipstick MODERATE (*)    Leukocytes,Ua MODERATE (*)    Bacteria, UA RARE (*)    All other components within normal limits  RESP PANEL BY RT-PCR (FLU A&B, COVID) [id number]    EKG None  Radiology CT Head Wo Contrast  Result Date: 11/29/2020 CLINICAL DATA:  Headache and dizziness 1 week.  Vision change EXAM: CT HEAD WITHOUT CONTRAST TECHNIQUE: Contiguous axial images were obtained from the base of the skull through the vertex without intravenous contrast. COMPARISON:  CT head 06/07/2014 FINDINGS: Brain: No evidence of acute infarction, hemorrhage, hydrocephalus, extra-axial collection or mass lesion/mass effect. Dural calcification in the frontal lobes bilaterally with progression since 2016. No edema in the adjacent brain. Vascular: Negative for hyperdense vessel Skull:  Negative Sinuses/Orbits: Negative Other: None IMPRESSION: No acute abnormality. Dural base calcifications in the frontal lobes bilaterally with progression since 2016. Probable degenerative calcification. Electronically Signed   By: Marlan Palau M.D.   On: 11/29/2020 14:30    Procedures Procedures    Medications Ordered in ED Medications  sodium chloride 0.9 % bolus 1,000 mL (0 mLs Intravenous Stopped 11/29/20 1348)  ketorolac (TORADOL) 30 MG/ML injection 30 mg (30 mg Intravenous Given 11/29/20 1248)    ED Course  I have reviewed the triage vital signs and the nursing notes.  Pertinent labs & imaging results that were available during my care of the patient were reviewed by me and considered in my medical decision making (see chart for details).    MDM Rules/Calculators/A&P                           Pt is feeling better after treatment.  SBP down to 130 with pain control.  Sx are likely sinus related, so she is d/c with afrin/flonase/ibuprofen.  She is to do saline rinses as well.  She is to return if worse.  F/u with pcp.  Final Clinical Impression(s) / ED Diagnoses Final diagnoses:  Acute nonintractable headache, unspecified headache type    Rx / DC Orders ED Discharge Orders          Ordered    sodium chloride (OCEAN) 0.65 % SOLN nasal spray  As needed        11/29/20 1456    fluticasone (FLONASE) 50 MCG/ACT nasal spray  Daily        11/29/20 1456    ibuprofen (ADVIL) 800 MG tablet  3 times daily        11/29/20 1456    meclizine (ANTIVERT) 25 MG tablet  3 times daily PRN        11/29/20 1456             Jacalyn Lefevre, MD 11/29/20 1457

## 2020-11-29 NOTE — ED Notes (Signed)
Dc instructions and scripts reviewed with pt. No questions or concerns at this time. Will follow up with pcp as needed. Pt ambulated with steady gait to lobby at discharge.

## 2020-11-29 NOTE — ED Triage Notes (Signed)
Sent by Urgent Care.  C/o HA and dizziness x 1 week.  States she lost her vision for several minutes after masturbating.  Last night BP was 163/107 per pt. Severe pain yesterday morning. Vision is back to normal but sees spots at times.

## 2020-11-29 NOTE — ED Notes (Signed)
Pt ambulated with steady gait to bathroom. Urine obtained and sent to lab

## 2020-11-29 NOTE — ED Triage Notes (Signed)
Pt presents with headache, dizzy spells, vision loss for several minutes yesterday after masturbating. Pt states she has been taking her BP at home and has received readings of 160's/100's. Pt states dizzy spells and head pressure have been intermittent for 1 week.

## 2020-12-24 ENCOUNTER — Telehealth (INDEPENDENT_AMBULATORY_CARE_PROVIDER_SITE_OTHER): Payer: 59 | Admitting: Psychiatry

## 2020-12-24 ENCOUNTER — Other Ambulatory Visit: Payer: Self-pay

## 2020-12-24 ENCOUNTER — Encounter (HOSPITAL_COMMUNITY): Payer: Self-pay | Admitting: Psychiatry

## 2020-12-24 DIAGNOSIS — F411 Generalized anxiety disorder: Secondary | ICD-10-CM

## 2020-12-24 DIAGNOSIS — F900 Attention-deficit hyperactivity disorder, predominantly inattentive type: Secondary | ICD-10-CM

## 2020-12-24 MED ORDER — AMPHETAMINE-DEXTROAMPHETAMINE 20 MG PO TABS: 20.0000 mg | ORAL_TABLET | Freq: Two times a day (BID) | ORAL | 0 refills | Status: DC

## 2020-12-24 MED ORDER — ESCITALOPRAM OXALATE 20 MG PO TABS: 20.0000 mg | ORAL_TABLET | Freq: Every day | ORAL | 2 refills | Status: DC

## 2020-12-24 MED ORDER — CLONAZEPAM 1 MG PO TABS
ORAL_TABLET | ORAL | 2 refills | Status: DC
Start: 2020-12-24 — End: 2021-03-25

## 2020-12-24 NOTE — Progress Notes (Signed)
Virtual Visit via Telephone Note  I connected with Kathleen Velasquez on 12/24/20 at  8:40 AM EST by telephone and verified that I am speaking with the correct person using two identifiers.  Location: Patient: home Provider: office   I discussed the limitations, risks, security and privacy concerns of performing an evaluation and management service by telephone and the availability of in person appointments. I also discussed with the patient that there may be a patient responsible charge related to this service. The patient expressed understanding and agreed to proceed.      I discussed the assessment and treatment plan with the patient. The patient was provided an opportunity to ask questions and all were answered. The patient agreed with the plan and demonstrated an understanding of the instructions.   The patient was advised to call back or seek an in-person evaluation if the symptoms worsen or if the condition fails to improve as anticipated.  I provided 15 minutes of non-face-to-face time during this encounter.   [name], MD  [facility] MD/PA/NP OP Progress Note  12/24/2020 9:04 AM Kathleen Velasquez  MRN:  [id number]  Chief Complaint:  Chief Complaint   Depression; Anxiety; Follow-up; ADD    HPI: This patient is a 30 year old single white female who lives with her niece in [place].  She works as a Occupational hygienist at [employer]  The patient returns for follow-up after 3 months.  She states she was pretty ill last month.  She had a severe sinus headache with nausea and fatigue.  She states that the vertigo associated with it has not gone away.  It seems to come and go.  She is trying to take it easy at work and not do any heavy lifting.  She does have an appointment with neurology coming up in January.  Despite this her mood has been stable.  She denies significant depression anxiety difficulty sleeping.  She is focusing well with the Adderall  Visit Diagnosis:     ICD-10-CM   1. Generalized anxiety disorder  F41.1     2. Attention deficit hyperactivity disorder (ADHD), predominantly inattentive type  F90.0       Past Psychiatric History: none  Past Medical History:  Past Medical History:  Diagnosis Date   Abscess    right buttocks/thigh   Anxiety    Depression    DUB (dysfunctional uterine bleeding) 05/25/2012   Hydradenitis    Hypothyroidism    Polycystic ovaries    Thyroid disease    hypothyroidism    Past Surgical History:  Procedure Laterality Date   ADENOIDECTOMY     BALLOON DILATION N/A 12/19/2019   Procedure: BALLOON DILATION;  Surgeon: Lanelle Bal, DO;  [facility];  Service: Endoscopy;  Laterality: N/A;   BIOPSY  12/19/2019   Procedure: BIOPSY;  Surgeon: Lanelle Bal, DO;  [facility];  Service: Endoscopy;;   CHOLECYSTECTOMY  01/27/2012   Procedure: LAPAROSCOPIC CHOLECYSTECTOMY;  Surgeon: Fabio Bering, MD;  Location: [facility];  Service: General;  Laterality: N/A;   ESOPHAGOGASTRODUODENOSCOPY (EGD) WITH PROPOFOL N/A 12/19/2019   Procedure: ESOPHAGOGASTRODUODENOSCOPY (EGD) WITH PROPOFOL;  Surgeon: Lanelle Bal, DO;  [facility];  Service: Endoscopy;  Laterality: N/A;  3:00pm   HERNIA REPAIR Left    LIH   HYDRADENITIS EXCISION  10/02/2011   Procedure: EXCISION HYDRADENITIS AXILLA;  Surgeon: Fabio Bering, MD;  Location: [facility];  Service: General;  Laterality: Right;  Right Axillary Dissection  HYDRADENITIS EXCISION Left 10/19/2013   Procedure: INCISION OF HIDRADENITIS SUPPURATIVA LEFT AXILLA;  Surgeon: Marlane Hatcher, MD;  Location: [facility];  Service: General;  Laterality: Left;   HYDRADENITIS EXCISION Left 09/09/2018   Procedure: EXCISION HIDRADENITIS AXILLA, LEFT;  Surgeon: Franky Macho, MD;  Location: [facility];  Service: General;  Laterality: Left;   TONSILLECTOMY      Family Psychiatric History: See below  Family History:  Family History  Problem Relation Age of Onset    Diabetes Mother    Hypertension Mother    Hyperlipidemia Mother    Neuropathy Mother    Diabetes Maternal Grandmother    Hypertension Maternal Grandmother    Heart disease Maternal Grandmother        CHF   COPD Maternal Grandmother    Cirrhosis Maternal Grandmother    Anxiety disorder Maternal Grandmother    Cancer Maternal Grandfather        lung   Diabetes Paternal Grandmother    Diabetes Paternal Grandfather    Aneurysm Paternal Grandfather    Anxiety disorder Paternal Grandfather    Drug abuse Father    ADD / ADHD Brother    Stroke Other    Anxiety disorder Maternal Uncle    Anxiety disorder Paternal Uncle    Depression Paternal Uncle    Anxiety disorder Maternal Aunt     Social History:  Social History   Socioeconomic History   Marital status: Single    Spouse name: Not on file   Number of children: Not on file   Years of education: Not on file   Highest education level: Not on file  Occupational History   Not on file  Tobacco Use   Smoking status: Every Day    Packs/day: 0.25    Years: 7.00    Pack years: 1.75    Types: Cigarettes   Smokeless tobacco: Never  Vaping Use   Vaping Use: Never used  Substance and Sexual Activity   Alcohol use: Yes    Comment: social use    Drug use: No   Sexual activity: Yes    Birth control/protection: None, Implant  Other Topics Concern   Not on file  Social History Narrative   Not on file   Social Determinants of Health   Financial Resource Strain: Not on file  Food Insecurity: Not on file  Transportation Needs: Not on file  Physical Activity: Not on file  Stress: Not on file  Social Connections: Not on file    Allergies:  Allergies  Allergen Reactions   Bactrim Hives and Itching   Ciprofloxacin Hives and Itching    CRNA Discontinued preop antibiotic  IVPB  Cipro in OR after developed itching and hives left arm.  Received Benadryl with relief noted. Dr.Gonzalez and Dr. Leticia Penna informed. DDallasRN   Other  Other (See Comments)    [place] causes a hives, swelling, itching, and anaphylaxis.    Penicillins Hives and Itching    Has patient had a PCN reaction causing immediate rash, facial/tongue/throat swelling, SOB or lightheadedness with hypotension: Yes Has patient had a PCN reaction causing severe rash involving mucus membranes or skin necrosis: No Has patient had a PCN reaction that required hospitalization No Has patient had a PCN reaction occurring within the last 10 years: No If all of the above answers are "NO", then may proceed with Cephalosporin use.    Percocet [Oxycodone-Acetaminophen] Other (See Comments)    Face flushes   Vancomycin Itching    Metabolic Disorder  Labs: Lab Results  Component Value Date   HGBA1C 5.7 (H) 05/25/2012   MPG 117 (H) 05/25/2012   No results found for: PROLACTIN No results found for: CHOL, TRIG, HDL, CHOLHDL, VLDL, LDLCALC Lab Results  Component Value Date   TSH 2.515 05/25/2012    Therapeutic Level Labs: No results found for: LITHIUM No results found for: VALPROATE No components found for:  CBMZ  Current Medications: Current Outpatient Medications  Medication Sig Dispense Refill   amphetamine-dextroamphetamine (ADDERALL) 20 MG tablet Take 1 tablet (20 mg total) by mouth 2 (two) times daily. 60 tablet 0   amphetamine-dextroamphetamine (ADDERALL) 20 MG tablet Take 1 tablet (20 mg total) by mouth 2 (two) times daily. 90 tablet 0   amphetamine-dextroamphetamine (ADDERALL) 20 MG tablet Take 1 tablet (20 mg total) by mouth 2 (two) times daily. 60 tablet 0   benzonatate (TESSALON) 100 MG capsule Take 1 capsule (100 mg total) by mouth 3 (three) times daily as needed for cough. (Patient not taking: Reported on 11/29/2020) 30 capsule 0   cetirizine (ZYRTEC ALLERGY) 10 MG tablet Take 1 tablet (10 mg total) by mouth daily. (Patient taking differently: Take 10 mg by mouth daily as needed for allergies. ) 30 tablet 0   chlorpheniramine-HYDROcodone  (TUSSIONEX PENNKINETIC ER) 10-8 MG/5ML SUER Take 5 mLs by mouth every 12 (twelve) hours as needed for cough. (Patient not taking: Reported on 11/29/2020) 60 mL 0   clonazePAM (KLONOPIN) 1 MG tablet TAKE 1 TABLET(1 MG) BY MOUTH TWICE DAILY AS NEEDED FOR ANXIETY 60 tablet 2   escitalopram (LEXAPRO) 20 MG tablet Take 1 tablet (20 mg total) by mouth daily. 90 tablet 2   fluticasone (FLONASE) 50 MCG/ACT nasal spray Place 1 spray into both nostrils daily. 18.2 mL 2   ibuprofen (ADVIL) 800 MG tablet Take 1 tablet (800 mg total) by mouth 3 (three) times daily. 21 tablet 0   meclizine (ANTIVERT) 25 MG tablet Take 1 tablet (25 mg total) by mouth 3 (three) times daily as needed for dizziness. 30 tablet 0   NUVARING 0.12-0.015 MG/24HR vaginal ring INSERT VAGINALLY AND LEAVE IN. PLACE FOR 3 CONSECUTIVE WEEKS, THEN REMOVE FOR 1 WEEK (Patient taking differently: Place 1 each vaginally See admin instructions. PLACE FOR 3 CONSECUTIVE WEEKS, THEN REMOVE FOR 1 WEEK) 1 each 10   omeprazole (PRILOSEC) 20 MG capsule Take 1 capsule (20 mg total) by mouth 2 (two) times daily. 60 capsule 5   sodium chloride (OCEAN) 0.65 % SOLN nasal spray Place 1 spray into both nostrils as needed for congestion. 88 mL 0   No current facility-administered medications for this visit.     Musculoskeletal: Strength & Muscle Tone: na Gait & Station: na Patient leans: N/A  Psychiatric Specialty Exam: Review of Systems  Gastrointestinal:  Positive for nausea.  Neurological:  Positive for dizziness and headaches.  All other systems reviewed and are negative.  There were no vitals taken for this visit.There is no height or weight on file to calculate BMI.  General Appearance: NA  Eye Contact:  NA  Speech:  Clear and Coherent  Volume:  Normal  Mood:  Euthymic  Affect:  NA  Thought Process:  Goal Directed  Orientation:  Full (Time, Place, and Person)  Thought Content: Rumination   Suicidal Thoughts:  No  Homicidal Thoughts:  No   Memory:  Immediate;   Good Recent;   Good Remote;   Fair  Judgement:  Good  Insight:  Good  Psychomotor Activity:  Decreased  Concentration:  Concentration: Good and Attention Span: Good  Recall:  Good  Fund of Knowledge: Good  Language: Good  Akathisia:  No  Handed:  Right  AIMS (if indicated): not done  Assets:  Communication Skills Desire for Improvement Resilience Social Support Talents/Skills  ADL's:  Intact  Cognition: WNL  Sleep:  Good   Screenings: PHQ2-9    Flowsheet Row [facility]24[facility]2020 [facility] Video Visit from 09/30/2020 in [facility] Video Visit from 07/01/2020 in [facility]  PHQ-2 Total Score 0 0 0      Flowsheet Row Video Visit from 12/24/2020 in [facility] Most recent reading at 12/24/2020  8:58 AM ED from 11/29/2020 in [facility] EMERGENCY DEPARTMENT Most recent reading at 11/29/2020 12:08 PM ED from 11/29/2020 in [facility] [place] Most recent reading at 11/29/2020 10:05 AM  C-SSRS RISK CATEGORY No Risk No Risk No Risk        Assessment and Plan: This patient is a 30 year old female with a history of depression anxiety and ADHD.  Despite her recent health history she is doing well on her current regimen.  She will continue Lexapro 20 mg daily for depression and anxiety, clonazepam 1 mg twice daily for anxiety and Adderall 20 mg twice daily for ADD.  She will return to see me in 13-month   [name], MD 12/24/2020, 9:04 AM

## 2021-01-13 ENCOUNTER — Other Ambulatory Visit: Payer: Self-pay

## 2021-01-13 ENCOUNTER — Encounter: Payer: Self-pay | Admitting: Emergency Medicine

## 2021-01-13 ENCOUNTER — Ambulatory Visit
Admission: EM | Admit: 2021-01-13 | Discharge: 2021-01-13 | Disposition: A | Discharge status: Home / Self Care | Payer: 59 | Attending: Urgent Care | Admitting: Urgent Care

## 2021-01-13 DIAGNOSIS — J069 Acute upper respiratory infection, unspecified: Secondary | ICD-10-CM

## 2021-01-13 DIAGNOSIS — Z8616 Personal history of COVID-19: Secondary | ICD-10-CM

## 2021-01-13 DIAGNOSIS — R052 Subacute cough: Secondary | ICD-10-CM | POA: Diagnosis not present

## 2021-01-13 DIAGNOSIS — J3089 Other allergic rhinitis: Secondary | ICD-10-CM

## 2021-01-13 MED ORDER — PREDNISONE 20 MG PO TABS: ORAL_TABLET | ORAL | 0 refills | Status: DC

## 2021-01-13 MED ORDER — PROMETHAZINE-DM 6.25-15 MG/5ML PO SYRP: 5.0000 mL | ORAL_SOLUTION | Freq: Every evening | ORAL | 0 refills | Status: DC | PRN

## 2021-01-13 NOTE — ED Triage Notes (Signed)
Pt here with productive cough, chest congestion, nasal congestion and fatigue for a week. States she is starting to feel better, but is still coughing up mucous.

## 2021-01-13 NOTE — ED Provider Notes (Signed)
[facility]   MRN: [id number] DOB: 02/07/1990  Subjective:   Kathleen Velasquez is a 31 y.o. female presenting for 1 week history of persistent coughing.  Initially started out with sinus congestion and fatigue.  She also felt some chest congestion.  The sinus symptoms got better but she continues with the coughing and would like a steroid course as she usually does better with this.  She does have a history of allergies but does not take anything for them.  No chest pain, shortness of breath or wheezing.  Has had 1 sick contact with her daughter.  Has a history of COVID-19, does not want to be tested for this again as she feels that this is very different for her.  No current facility-administered medications for this encounter.  Current Outpatient Medications:    amphetamine-dextroamphetamine (ADDERALL) 20 MG tablet, Take 1 tablet (20 mg total) by mouth 2 (two) times daily., Disp: 60 tablet, Rfl: 0   amphetamine-dextroamphetamine (ADDERALL) 20 MG tablet, Take 1 tablet (20 mg total) by mouth 2 (two) times daily., Disp: 90 tablet, Rfl: 0   amphetamine-dextroamphetamine (ADDERALL) 20 MG tablet, Take 1 tablet (20 mg total) by mouth 2 (two) times daily., Disp: 60 tablet, Rfl: 0   benzonatate (TESSALON) 100 MG capsule, Take 1 capsule (100 mg total) by mouth 3 (three) times daily as needed for cough. (Patient not taking: Reported on 11/29/2020), Disp: 30 capsule, Rfl: 0   cetirizine (ZYRTEC ALLERGY) 10 MG tablet, Take 1 tablet (10 mg total) by mouth daily. (Patient taking differently: Take 10 mg by mouth daily as needed for allergies. ), Disp: 30 tablet, Rfl: 0   chlorpheniramine-HYDROcodone (TUSSIONEX PENNKINETIC ER) 10-8 MG/5ML SUER, Take 5 mLs by mouth every 12 (twelve) hours as needed for cough. (Patient not taking: Reported on 11/29/2020), Disp: 60 mL, Rfl: 0   clonazePAM (KLONOPIN) 1 MG tablet, TAKE 1 TABLET(1 MG) BY MOUTH TWICE DAILY AS NEEDED FOR ANXIETY, Disp: 60 tablet, Rfl: 2    escitalopram (LEXAPRO) 20 MG tablet, Take 1 tablet (20 mg total) by mouth daily., Disp: 90 tablet, Rfl: 2   fluticasone (FLONASE) 50 MCG/ACT nasal spray, Place 1 spray into both nostrils daily., Disp: 18.2 mL, Rfl: 2   ibuprofen (ADVIL) 800 MG tablet, Take 1 tablet (800 mg total) by mouth 3 (three) times daily., Disp: 21 tablet, Rfl: 0   meclizine (ANTIVERT) 25 MG tablet, Take 1 tablet (25 mg total) by mouth 3 (three) times daily as needed for dizziness., Disp: 30 tablet, Rfl: 0   NUVARING 0.12-0.015 MG/24HR vaginal ring, INSERT VAGINALLY AND LEAVE IN. PLACE FOR 3 CONSECUTIVE WEEKS, THEN REMOVE FOR 1 WEEK (Patient taking differently: Place 1 each vaginally See admin instructions. PLACE FOR 3 CONSECUTIVE WEEKS, THEN REMOVE FOR 1 WEEK), Disp: 1 each, Rfl: 10   omeprazole (PRILOSEC) 20 MG capsule, Take 1 capsule (20 mg total) by mouth 2 (two) times daily., Disp: 60 capsule, Rfl: 5   sodium chloride (OCEAN) 0.65 % SOLN nasal spray, Place 1 spray into both nostrils as needed for congestion., Disp: 88 mL, Rfl: 0   Allergies  Allergen Reactions   Bactrim Hives and Itching   Ciprofloxacin Hives and Itching    CRNA Discontinued preop antibiotic  IVPB  Cipro in OR after developed itching and hives left arm.  Received Benadryl with relief noted. Dr.Gonzalez and Dr. Leticia PennaZiegler informed. DDallasRN   Other Other (See Comments)    [place]urkey causes a hives, swelling, itching, and anaphylaxis.  Penicillins Hives and Itching    Has patient had a PCN reaction causing immediate rash, facial/tongue/throat swelling, SOB or lightheadedness with hypotension: Yes Has patient had a PCN reaction causing severe rash involving mucus membranes or skin necrosis: No Has patient had a PCN reaction that required hospitalization No Has patient had a PCN reaction occurring within the last 10 years: No If all of the above answers are "NO", then may proceed with Cephalosporin use.    Percocet [Oxycodone-Acetaminophen] Other (See  Comments)    Face flushes   Vancomycin Itching    Past Medical History:  Diagnosis Date   Abscess    right buttocks/thigh   Anxiety    Depression    DUB (dysfunctional uterine bleeding) 05/25/2012   Hydradenitis    Hypothyroidism    Polycystic ovaries    Thyroid disease    hypothyroidism     Past Surgical History:  Procedure Laterality Date   ADENOIDECTOMY     BALLOON DILATION N/A 12/19/2019   Procedure: BALLOON DILATION;  Surgeon: Lanelle Bal, DO;  [facility];  Service: Endoscopy;  Laterality: N/A;   BIOPSY  12/19/2019   Procedure: BIOPSY;  Surgeon: Lanelle Bal, DO;  [facility];  Service: Endoscopy;;   CHOLECYSTECTOMY  01/27/2012   Procedure: LAPAROSCOPIC CHOLECYSTECTOMY;  Surgeon: Fabio Bering, MD;  Location: [facility];  Service: General;  Laterality: N/A;   ESOPHAGOGASTRODUODENOSCOPY (EGD) WITH PROPOFOL N/A 12/19/2019   Procedure: ESOPHAGOGASTRODUODENOSCOPY (EGD) WITH PROPOFOL;  Surgeon: Lanelle Bal, DO;  [facility];  Service: Endoscopy;  Laterality: N/A;  3:00pm   HERNIA REPAIR Left    LIH   HYDRADENITIS EXCISION  10/02/2011   Procedure: EXCISION HYDRADENITIS AXILLA;  Surgeon: Fabio Bering, MD;  Location: [facility];  Service: General;  Laterality: Right;  Right Axillary Dissection   HYDRADENITIS EXCISION Left 10/19/2013   Procedure: INCISION OF HIDRADENITIS SUPPURATIVA LEFT AXILLA;  Surgeon: Marlane Hatcher, MD;  Location: [facility];  Service: General;  Laterality: Left;   HYDRADENITIS EXCISION Left 09/09/2018   Procedure: EXCISION HIDRADENITIS AXILLA, LEFT;  Surgeon: Franky Macho, MD;  Location: [facility];  Service: General;  Laterality: Left;   TONSILLECTOMY      Family History  Problem Relation Age of Onset   Diabetes Mother    Hypertension Mother    Hyperlipidemia Mother    Neuropathy Mother    Diabetes Maternal Grandmother    Hypertension Maternal Grandmother    Heart disease Maternal Grandmother        CHF    COPD Maternal Grandmother    Cirrhosis Maternal Grandmother    Anxiety disorder Maternal Grandmother    Cancer Maternal Grandfather        lung   Diabetes Paternal Grandmother    Diabetes Paternal Grandfather    Aneurysm Paternal Grandfather    Anxiety disorder Paternal Grandfather    Drug abuse Father    ADD / ADHD Brother    Stroke Other    Anxiety disorder Maternal Uncle    Anxiety disorder Paternal Uncle    Depression Paternal Uncle    Anxiety disorder Maternal Aunt     Social History   Tobacco Use   Smoking status: Every Day    Packs/day: 0.25    Years: 7.00    Pack years: 1.75    Types: Cigarettes   Smokeless tobacco: Never  Vaping Use   Vaping Use: Never used  Substance Use Topics   Alcohol use: Yes  Comment: social use    Drug use: No    ROS   Objective:   Vitals: BP (!) 147/88    Pulse 77    Temp 99 F (37.2 C)    Resp 20    SpO2 98%   Physical Exam Constitutional:      General: She is not in acute distress.    Appearance: Normal appearance. She is well-developed. She is not ill-appearing, toxic-appearing or diaphoretic.  HENT:     Head: Normocephalic and atraumatic.     Right Ear: Tympanic membrane, ear canal and external ear normal. No drainage or tenderness. No middle ear effusion. There is no impacted cerumen. Tympanic membrane is not erythematous.     Left Ear: Tympanic membrane, ear canal and external ear normal. No drainage or tenderness.  No middle ear effusion. There is no impacted cerumen. Tympanic membrane is not erythematous.     Nose: Nose normal. No congestion or rhinorrhea.     Mouth/Throat:     Mouth: Mucous membranes are moist. No oral lesions.     Pharynx: No pharyngeal swelling, oropharyngeal exudate, posterior oropharyngeal erythema or uvula swelling.     Tonsils: No tonsillar exudate or tonsillar abscesses.  Eyes:     General: No scleral icterus.       Right eye: No discharge.        Left eye: No discharge.     Extraocular  Movements: Extraocular movements intact.     Right eye: Normal extraocular motion.     Left eye: Normal extraocular motion.     Conjunctiva/sclera: Conjunctivae normal.  Cardiovascular:     Rate and Rhythm: Normal rate and regular rhythm.     Pulses: Normal pulses.     Heart sounds: Normal heart sounds. No murmur heard.   No friction rub. No gallop.  Pulmonary:     Effort: Pulmonary effort is normal. No respiratory distress.     Breath sounds: Normal breath sounds. No stridor. No wheezing, rhonchi or rales.  Musculoskeletal:     Cervical back: Normal range of motion and neck supple.  Lymphadenopathy:     Cervical: No cervical adenopathy.  Skin:    General: Skin is warm and dry.     Findings: No rash.  Neurological:     General: No focal deficit present.     Mental Status: She is alert and oriented to person, place, and time.  Psychiatric:        Mood and Affect: Mood normal.        Behavior: Behavior normal.        Thought Content: Thought content normal.    Assessment and Plan :   PDMP not reviewed this encounter.  1. Viral upper respiratory illness   2. History of COVID-19   3. Subacute cough   4. Allergic rhinitis due to other allergic trigger, unspecified seasonality    Patient declined COVID-19 testing. Deferred imaging given clear cardiopulmonary exam, hemodynamically stable vital signs. I obliged her and did the steroid course which she usually does better with.  Recommended she restart allergy medications. Counseled patient on potential for adverse effects with medications prescribed/recommended today, ER and return-to-clinic precautions discussed, patient verbalized understanding.    Wallis Bamberg, [place] 01/13/21 1746

## 2021-01-22 ENCOUNTER — Ambulatory Visit: Payer: 59 | Admitting: Psychiatry

## 2021-01-22 ENCOUNTER — Telehealth: Payer: Self-pay | Admitting: Psychiatry

## 2021-01-22 ENCOUNTER — Encounter: Payer: Self-pay | Admitting: Psychiatry

## 2021-01-22 VITALS — BP 149/101 | HR 89 | Ht 65.0 in | Wt 250.0 lb

## 2021-01-22 DIAGNOSIS — G4453 Primary thunderclap headache: Secondary | ICD-10-CM

## 2021-01-22 MED ORDER — PROPRANOLOL HCL 20 MG PO TABS: 20.0000 mg | ORAL_TABLET | Freq: Two times a day (BID) | ORAL | 2 refills | Status: DC

## 2021-01-22 MED ORDER — INDOMETHACIN 25 MG PO CAPS: 25.0000 mg | ORAL_CAPSULE | ORAL | 2 refills | Status: DC | PRN

## 2021-01-22 NOTE — Progress Notes (Signed)
Referring:  Elfredia Nevins, MD [address] [place],  [place] [address]  PCP: Elfredia Nevins, MD  Neurology was asked to evaluate Kathleen Velasquez, a 31 year old female for a chief complaint of headaches.  Our recommendations of care will be communicated by shared medical record.    CC:  headaches  HPI:  Medical co-morbidities: anxiety, ADHD  The patient presents for evaluation of headaches which have been present since October 2022. Had a sinus infection at that time and developed severe pressure in her head associated with nauseated and dizziness. These episodes only last for a few minutes at a time. Has checked her blood pressure during these episodes and it is typically elevated. Has other types of headaches which are throbbing or sharp pain and can be associated with phonophobia and nausea. Headaches occur 2 times per week.  She also reports 2 episodes of severe headaches during orgasm. Described as worst headache of her life. Lost vision and with one of these headaches. Went to the ED where [facility] showed calcifications in the frontal lobes, with no acute process.  Saw her eye doctor a couple of months ago and was told eye exam was normal.  She does have a history of tension headaches prior to this, but has never been diagnosed with migraines.  Headache History: Onset: October 2022 Triggers: orgasm Aura: no Location: retro-orbital, holocephalic Quality/Description: pressure, throbbing, sharp pain Severity: 10/10 Associated Symptoms:  Photophobia: no  Phonophobia: yes  Nausea: yes Vomiting:yes +pulsatile tinnitus Worse with activity?: yes Duration of headaches: minutes to several hours Red flags:   Thunderclap onset  Headache days per month: 8 Headache free days per month: 22  Current Treatment: Abortive none  Preventative none  Prior Therapies                                 Tylenol Ibuprofen   Headache Risk Factors: Headache risk factors and/or  co-morbidities (+) Neck Pain (+) Obesity  Body mass index is 41.6 kg/m. (-) History of Traumatic Brain Injury and/or Concussion  LABS: CBC    Component Value Date/Time   WBC 11.5 (H) 11/29/2020 1234   RBC 4.62 11/29/2020 1234   HGB 14.4 11/29/2020 1234   HCT 42.8 11/29/2020 1234   PLT 313 11/29/2020 1234   MCV 92.6 11/29/2020 1234   MCH 31.2 11/29/2020 1234   MCHC 33.6 11/29/2020 1234   RDW 13.1 11/29/2020 1234   LYMPHSABS 4.3 (H) 11/29/2020 1234   MONOABS 0.5 11/29/2020 1234   EOSABS 0.3 11/29/2020 1234   BASOSABS 0.0 11/29/2020 1234   CMP Latest Ref Rng & Units 11/29/2020 02/27/2020 09/07/2017  Glucose 70 - 99 mg/dL 99 272(Z) 366(Y)  BUN 6 - 20 mg/dL 11 9 10   Creatinine 0.44 - 1.00 mg/dL 4.03 4.74  Sodium 135 - 145 mmol/L 137 132(L) 136  Potassium 3.5 - 5.1 mmol/L 3.5 3.5 3.6  Chloride 98 - 111 mmol/L 104 104 105  CO2 22 - 32 mmol/L 24 20(L) 23  Calcium 8.9 - 10.3 mg/dL 2.59) 9.0 5.6(L)  Total Protein 6.5 - 8.1 g/dL 6.9 - 7.2  Total Bilirubin 0.3 - 1.2 mg/dL 8.7(F) - 6.4(P)  Alkaline Phos 38 - 126 U/L 75 - 71  AST 15 - 41 U/L 18 - 19  ALT 0 - 44 U/L 15 - 15     IMAGING:  [facility] 11/2020: No acute abnormality.   Dural base calcifications in the  frontal lobes bilaterally with progression since 2016. Probable degenerative calcification.  Imaging independently reviewed on January 22, 2021   Current Outpatient Medications on File Prior to Visit  Medication Sig Dispense Refill   amphetamine-dextroamphetamine (ADDERALL) 20 MG tablet Take 1 tablet (20 mg total) by mouth 2 (two) times daily. 60 tablet 0   chlorpheniramine-HYDROcodone (TUSSIONEX PENNKINETIC ER) 10-8 MG/5ML SUER Take 5 mLs by mouth every 12 (twelve) hours as needed for cough. 60 mL 0   clonazePAM (KLONOPIN) 1 MG tablet TAKE 1 TABLET(1 MG) BY MOUTH TWICE DAILY AS NEEDED FOR ANXIETY 60 tablet 2   escitalopram (LEXAPRO) 20 MG tablet Take 1 tablet (20 mg total) by mouth daily. 90 tablet 2   ibuprofen  (ADVIL) 800 MG tablet Take 1 tablet (800 mg total) by mouth 3 (three) times daily. 21 tablet 0   meclizine (ANTIVERT) 25 MG tablet Take 1 tablet (25 mg total) by mouth 3 (three) times daily as needed for dizziness. 30 tablet 0   NUVARING 0.12-0.015 MG/24HR vaginal ring INSERT VAGINALLY AND LEAVE IN. PLACE FOR 3 CONSECUTIVE WEEKS, THEN REMOVE FOR 1 WEEK (Patient taking differently: Place 1 each vaginally See admin instructions. PLACE FOR 3 CONSECUTIVE WEEKS, THEN REMOVE FOR 1 WEEK) 1 each 10   omeprazole (PRILOSEC) 20 MG capsule Take 1 capsule (20 mg total) by mouth 2 (two) times daily. 60 capsule 5   promethazine-dextromethorphan (PROMETHAZINE-DM) 6.25-15 MG/5ML syrup Take 5 mLs by mouth at bedtime as needed for cough. 100 mL 0   amphetamine-dextroamphetamine (ADDERALL) 20 MG tablet Take 1 tablet (20 mg total) by mouth 2 (two) times daily. (Patient not taking: Reported on 01/22/2021) 90 tablet 0   amphetamine-dextroamphetamine (ADDERALL) 20 MG tablet Take 1 tablet (20 mg total) by mouth 2 (two) times daily. (Patient not taking: Reported on 01/22/2021) 60 tablet 0   benzonatate (TESSALON) 100 MG capsule Take 1 capsule (100 mg total) by mouth 3 (three) times daily as needed for cough. (Patient not taking: Reported on 11/29/2020) 30 capsule 0   cetirizine (ZYRTEC ALLERGY) 10 MG tablet Take 1 tablet (10 mg total) by mouth daily. (Patient taking differently: Take 10 mg by mouth daily as needed for allergies. ) 30 tablet 0   fluticasone (FLONASE) 50 MCG/ACT nasal spray Place 1 spray into both nostrils daily. (Patient not taking: Reported on 01/22/2021) 18.2 mL 2   predniSONE (DELTASONE) 20 MG tablet Take 2 tablets daily with breakfast. (Patient not taking: Reported on 01/22/2021) 10 tablet 0   sodium chloride (OCEAN) 0.65 % SOLN nasal spray Place 1 spray into both nostrils as needed for congestion. (Patient not taking: Reported on 01/22/2021) 88 mL 0   No current facility-administered medications on file prior to  visit.     Allergies: Allergies  Allergen Reactions   Bactrim Hives and Itching   Ciprofloxacin Hives and Itching    CRNA Discontinued preop antibiotic  IVPB  Cipro in OR after developed itching and hives left arm.  Received Benadryl with relief noted. Dr.Gonzalez and Dr. Leticia PennaZiegler informed. DDallasRN   Other Other (See Comments)    [place]urkey causes a hives, swelling, itching, and anaphylaxis.    Penicillins Hives and Itching    Has patient had a PCN reaction causing immediate rash, facial/tongue/throat swelling, SOB or lightheadedness with hypotension: Yes Has patient had a PCN reaction causing severe rash involving mucus membranes or skin necrosis: No Has patient had a PCN reaction that required hospitalization No Has patient had a PCN reaction occurring within the last  10 years: No If all of the above answers are "NO", then may proceed with Cephalosporin use.    Percocet [Oxycodone-Acetaminophen] Other (See Comments)    Face flushes   Vancomycin Itching    Family History: Migraine or other headaches in the family:  no Aneurysms in a first degree relative:  grandfather had an aneurysm Brain tumors in the family:  no Other neurological illness in the family:   no  Past Medical History: Past Medical History:  Diagnosis Date   Abscess    right buttocks/thigh   Anxiety    Depression    DUB (dysfunctional uterine bleeding) 05/25/2012   Hydradenitis    Hypothyroidism    Polycystic ovaries    Thyroid disease    hypothyroidism    Past Surgical History Past Surgical History:  Procedure Laterality Date   ADENOIDECTOMY     BALLOON DILATION N/A 12/19/2019   Procedure: BALLOON DILATION;  Surgeon: Lanelle Bal, DO;  [facility];  Service: Endoscopy;  Laterality: N/A;   BIOPSY  12/19/2019   Procedure: BIOPSY;  Surgeon: Lanelle Bal, DO;  [facility];  Service: Endoscopy;;   CHOLECYSTECTOMY  01/27/2012   Procedure: LAPAROSCOPIC CHOLECYSTECTOMY;   Surgeon: Fabio Bering, MD;  [facility];  Service: General;  Laterality: N/A;   ESOPHAGOGASTRODUODENOSCOPY (EGD) WITH PROPOFOL N/A 12/19/2019   Procedure: ESOPHAGOGASTRODUODENOSCOPY (EGD) WITH PROPOFOL;  Surgeon: Lanelle Bal, DO;  [facility];  Service: Endoscopy;  Laterality: N/A;  3:00pm   HERNIA REPAIR Left    LIH   HYDRADENITIS EXCISION  10/02/2011   Procedure: EXCISION HYDRADENITIS AXILLA;  Surgeon: Fabio Bering, MD;  [facility];  Service: General;  Laterality: Right;  Right Axillary Dissection   HYDRADENITIS EXCISION Left 10/19/2013   Procedure: INCISION OF HIDRADENITIS SUPPURATIVA LEFT AXILLA;  Surgeon: Marlane Hatcher, MD;  Location[facility];  Service: General;  Laterality: Left;   HYDRADENITIS EXCISION Left 09/09/2018   Procedure: EXCISION HIDRADENITIS AXILLA, LEFT;  Surgeon: Franky Macho, MD;  Location: [facility];  Service: General;  Laterality: Left;   TONSILLECTOMY      Social History: Social History   Tobacco Use   Smoking status: Every Day    Packs/day: 0.25    Years: 7.00    Pack years: 1.75    Types: Cigarettes   Smokeless tobacco: Never  Vaping Use   Vaping Use: Never used  Substance Use Topics   Alcohol use: Yes    Comment: social use    Drug use: No    ROS: Negative for fevers, chills. Positive for headaches, dizziness. All other systems reviewed and negative unless stated otherwise in HPI.   Physical Exam:   Vital Signs: BP (!) 149/101    Pulse 89    Ht 5\' 5"  (1.651 m)    Wt 250 lb (113.4 kg)    BMI 41.60 kg/m  GENERAL: well appearing,in no acute distress,alert SKIN:  Color, texture, turgor normal. No rashes or lesions HEAD:  Normocephalic/atraumatic. CV:  RRR RESP: Normal respiratory effort MSK: +tenderness to palpation over bilateral occiput, neck, and shoulders  NEUROLOGICAL: Mental Status: Alert, oriented to person, place and time,Follows commands Cranial Nerves: PERRL,visual fields intact to  confrontation,extraocular movements intact,facial sensation intact,no facial droop or ptosis,hearing intact to finger rub bilaterally,no dysarthria Motor: muscle strength 5/5 both upper and lower extremities,no drift, normal tone Reflexes: 2+ throughout Sensation: intact to light touch all 4 extremities Coordination: Finger-to- nose-finger intact bilaterally Gait: normal-based  IMPRESSION: 31 year old female with a history of ADHD and anxiety who presents for evaluation of thunderclap headaches during orgasm and chronic headaches following a sinus infection in October. CTH with frontal calcifications, otherwise unremarkable. Will order CTA head to assess for intracranial aneurysm. Will start propranolol for headache prevention and Indomethacin for rescue. Low suspicion for IIH at this time as recent eye exam was unremarkable. If CTA negative and headache persists despite treatment will consider MRI/MRV.  PLAN: -CTA head -Preventive: Start propranolol 20 mg BID -Rescue: Indomethacin 25 mg PRN. Can also take 30 minutes prior to sexual activity to prevent sex headache -next steps: consider MRI/MRV if CTA negative and headaches persist  I spent a total of 41 minutes chart reviewing and counseling the patient. Headache education was done. Discussed treatment options including preventive and acute medications. Discussed medication side effects, adverse reactions and drug interactions. Written educational materials and patient instructions outlining all of the above were given.  Follow-up: 3 months  Ocie DoyneJennifer Irby Fails, MD 01/22/2021   9:02 AM

## 2021-01-22 NOTE — Telephone Encounter (Signed)
[employer] pending faxed notes 

## 2021-01-22 NOTE — Patient Instructions (Addendum)
CTA to look at blood vessels in the head Start propranolol 20 mg twice a day for headache prevention Take Indomethacin as needed for severe headache. Can also take 30 minutes prior to sexual activity to help prevent sex headaches

## 2021-01-23 NOTE — Telephone Encounter (Signed)
[employer] auth: [id number] (exp. 01/22/21 to 04/22/21) order sent to [facility] to be scheduled at [facility]

## 2021-01-27 ENCOUNTER — Encounter (HOSPITAL_COMMUNITY): Payer: Self-pay | Admitting: Radiology

## 2021-01-28 ENCOUNTER — Other Ambulatory Visit: Payer: Self-pay

## 2021-01-28 ENCOUNTER — Ambulatory Visit (HOSPITAL_COMMUNITY)
Admission: RE | Admit: 2021-01-28 | Discharge: 2021-01-28 | Disposition: A | Discharge status: Home / Self Care | Payer: 59 | Source: Ambulatory Visit | Attending: Psychiatry | Admitting: Psychiatry

## 2021-01-28 DIAGNOSIS — G4453 Primary thunderclap headache: Secondary | ICD-10-CM | POA: Diagnosis present

## 2021-01-28 MED ORDER — IOHEXOL 350 MG/ML SOLN
75.0000 mL | Freq: Once | INTRAVENOUS | Status: AC | PRN
  Administered 2021-01-28: 75 mL via INTRAVENOUS

## 2021-01-29 ENCOUNTER — Telehealth: Payer: Self-pay | Admitting: Psychiatry

## 2021-01-29 ENCOUNTER — Other Ambulatory Visit: Payer: Self-pay | Admitting: Psychiatry

## 2021-01-29 DIAGNOSIS — G932 Benign intracranial hypertension: Secondary | ICD-10-CM

## 2021-01-29 NOTE — Telephone Encounter (Signed)
Referral sent to [facility]. Phone: [phone]. 

## 2021-01-30 ENCOUNTER — Encounter: Payer: Self-pay | Admitting: Psychiatry

## 2021-01-30 ENCOUNTER — Telehealth: Payer: Self-pay | Admitting: Psychiatry

## 2021-01-30 NOTE — Telephone Encounter (Signed)
Pt have questions about head CT that was not address and questions about what;'s on the CT scan results. Would like a call from the nurse.

## 2021-02-03 ENCOUNTER — Emergency Department
Admission: EM | Admit: 2021-02-03 | Discharge: 2021-02-03 | Disposition: A | Discharge status: Home / Self Care | Payer: 59 | Attending: Student in an Organized Health Care Education/Training Program | Admitting: Student in an Organized Health Care Education/Training Program

## 2021-02-03 ENCOUNTER — Other Ambulatory Visit: Payer: Self-pay

## 2021-02-03 ENCOUNTER — Emergency Department: Payer: 59

## 2021-02-03 DIAGNOSIS — J029 Acute pharyngitis, unspecified: Secondary | ICD-10-CM | POA: Insufficient documentation

## 2021-02-03 DIAGNOSIS — D72829 Elevated white blood cell count, unspecified: Secondary | ICD-10-CM | POA: Insufficient documentation

## 2021-02-03 DIAGNOSIS — R0981 Nasal congestion: Secondary | ICD-10-CM

## 2021-02-03 DIAGNOSIS — Z20822 Contact with and (suspected) exposure to covid-19: Secondary | ICD-10-CM | POA: Diagnosis not present

## 2021-02-03 LAB — RESP PANEL BY RT-PCR (FLU A&B, COVID) ARPGX2
Influenza A by PCR: NEGATIVE
Influenza B by PCR: NEGATIVE
SARS Coronavirus 2 by RT PCR: NEGATIVE

## 2021-02-03 LAB — CBC
HCT: 43.1 % (ref 36.0–46.0)
Hemoglobin: 13.9 g/dL (ref 12.0–15.0)
MCH: 29.7 pg (ref 26.0–34.0)
MCHC: 32.3 g/dL (ref 30.0–36.0)
MCV: 92.1 fL (ref 80.0–100.0)
Platelets: 325 10*3/uL (ref 150–400)
RBC: 4.68 MIL/uL (ref 3.87–5.11)
RDW: 13.1 % (ref 11.5–15.5)
WBC: 17.2 10*3/uL — ABNORMAL HIGH (ref 4.0–10.5)
nRBC: 0 % (ref 0.0–0.2)

## 2021-02-03 LAB — BASIC METABOLIC PANEL
Anion gap: 9 (ref 5–15)
BUN: 11 mg/dL (ref 6–20)
CO2: 26 mmol/L (ref 22–32)
Calcium: 9.1 mg/dL (ref 8.9–10.3)
Chloride: 102 mmol/L (ref 98–111)
Creatinine, Ser: 0.67 mg/dL (ref 0.44–1.00)
GFR, Estimated: >60 mL/min (ref >60)
Glucose, Bld: 123 mg/dL — ABNORMAL HIGH (ref 70–99)
Potassium: 3.3 mmol/L — ABNORMAL LOW (ref 3.5–5.1)
Sodium: 137 mmol/L (ref 135–145)

## 2021-02-03 LAB — GROUP A STREP BY PCR: Group A Strep by PCR: NOT DETECTED

## 2021-02-03 MED ORDER — DOXYCYCLINE HYCLATE 100 MG PO TABS: 100.0000 mg | ORAL_TABLET | Freq: Two times a day (BID) | ORAL | 0 refills | Status: AC

## 2021-02-03 MED ORDER — KETOROLAC TROMETHAMINE 30 MG/ML IJ SOLN
30.0000 mg | Freq: Once | INTRAMUSCULAR | Status: AC
  Administered 2021-02-03: 30 mg via INTRAMUSCULAR
  Filled 2021-02-03: qty 1

## 2021-02-03 NOTE — ED Provider Triage Note (Signed)
Emergency Medicine Provider Triage Evaluation Note  Kathleen Velasquez, a 31 y.o. female  was evaluated in triage.  Pt complains of sore throat, cervical myalgias, chills, and headache for the last several days.  Patient had a CT head performed in outpatient setting for evaluation of possible pseudotumor cerebri.  She is further being evaluated by ophthalmology.  She denies any nausea, vomiting or syncope.  Review of Systems  Positive: Sore throat, chills, headache Negative: NV  Physical Exam  BP 139/87 (BP Location: Left Arm)    Pulse 80    Temp 99 F (37.2 C) (Oral)    Resp 20    Ht 5\' 5"  (1.651 m)    Wt 102.1 kg    SpO2 100%    BMI 37.44 kg/m  Gen:   Awake, no distress   Resp:  Normal effort  MSK:   Moves extremities without difficulty  Other:  ENT: uvula is midline and tonsils are absent  Medical Decision Making  Medically screening exam initiated at 1:02 PM.  Appropriate orders placed.  Kathleen Velasquez was informed that the remainder of the evaluation will be completed by another provider, this initial triage assessment does not replace that evaluation, and the importance of remaining in the ED until their evaluation is complete.  Patient with ED evaluation of sore throat, cervical myalgias and headache.   [name], PA-C 02/03/21 1304

## 2021-02-03 NOTE — ED Provider Notes (Signed)
[facility] Provider Note    Event Date/Time   First MD Initiated Contact with Patient 02/03/21 1326     (approximate)   History   Torticollis   HPI  Kathleen Velasquez is a 31 y.o. female with history of generalized anxiety disorder as well as recurrent headaches presents to the ER for evaluation of sore throat congestion cough some neck discomfort as well.  Is having persistent headaches.  Is not the worst headache of her life.  Just had recent CT imaging which showed no evidence of aneurysm or bleed did show possible empty sella turcica and she is being referred to ophthalmology for possible pseudotumor.  Has had sick contacts in the house.  Her niece was just diagnosed with pneumonia.  She the patient was seen in urgent care at the beginning of the month on the second and was given prednisone.     Physical Exam   Triage Vital Signs: ED Triage Vitals  Enc Vitals Group     BP 02/03/21 1251 139/87     Pulse Rate 02/03/21 1251 80     Resp 02/03/21 1251 20     Temp 02/03/21 1251 99 F (37.2 C)     Temp Source 02/03/21 1251 Oral     SpO2 02/03/21 1251 100 %     Weight 02/03/21 1248 225 lb (102.1 kg)     Height 02/03/21 1248 5\' 5"  (1.651 m)     Head Circumference --      Peak Flow --      Pain Score 02/03/21 1248 7     Pain Loc --      Pain Edu? --      Excl. in GC? --     Most recent vital signs: Vitals:   02/03/21 1251  BP: 139/87  Pulse: 80  Resp: 20  Temp: 99 F (37.2 C)  SpO2: 100%     Constitutional: Alert  Eyes: Conjunctivae are normal.  Head: Atraumatic. Nose: No congestion/rhinnorhea. Mouth/Throat: Mucous membranes are moist.  Bilateral tonsillar erythema no uvular edema.  No findings to suggest PTA or RPA.  No exudates.  Bilateral TMs are sunken and dull with clear effusion Neck: no meningismus,  ttp along right trapezius, no midline ttp, no rash or erythema Cardiovascular:   Good peripheral circulation. Respiratory: Normal  respiratory effort.  No retractions.  Gastrointestinal: Soft and nontender.  Musculoskeletal:  no deformity Neurologic:  MAE spontaneously. No gross focal neurologic deficits are appreciated.  Skin:  Skin is warm, dry and intact. No rash noted. Psychiatric: Mood and affect are normal. Speech and behavior are normal.    ED Results / Procedures / Treatments   Labs (all labs ordered are listed, but only abnormal results are displayed) Labs Reviewed  CBC - Abnormal; Notable for the following components:      Result Value   WBC 17.2 (*)    All other components within normal limits  BASIC METABOLIC PANEL - Abnormal; Notable for the following components:   Potassium 3.3 (*)    Glucose, Bld 123 (*)    All other components within normal limits  RESP PANEL BY RT-PCR (FLU A&B, COVID) [id number]  GROUP A STREP BY PCR     EKG     RADIOLOGY Please see ED Course for my review and interpretation.  I personally reviewed all radiographic images ordered to evaluate for the above acute complaints and reviewed radiology reports and findings.  These findings were personally discussed with  the patient.  Please see medical record for radiology report.    PROCEDURES:  Critical Care performed: No  Procedures   MEDICATIONS ORDERED IN ED: Medications  ketorolac (TORADOL) 30 MG/ML injection 30 mg (30 mg Intramuscular Given 02/03/21 1411)     IMPRESSION / MDM / ASSESSMENT AND PLAN / ED COURSE  I reviewed the triage vital signs and the nursing notes.                              Differential diagnosis includes, but is not limited to, URI, flu, COVID, pneumonia, strep, sinusitis, otitis, pharyngitis, PTA, RPA, meningitis  Patient presenting with symptoms as described above consistent with URI.  Patient clinically well-appearing in no acute distress.  Does have leukocytosis but also recently on course of steroids does have symptoms consistent with URI.  Strep is negative.  Flu and COVID ordered  and are pending.  No acidemia.  She is not meningitic.  After discussion of her signs and symptoms for which she is presenting have a lower suspicion for meningitis and do not believe that LP is clinically indicated based on this presentation.  No symptoms of encephalitis.  Will give Toradol.  Will check x-ray.    Clinical Course as of 02/03/21 1459  Mon Feb 03, 2021  1457 Patient reassessed.  She has very well-appearing.  Does have a history of sinusitis and feels that this could be related.  Given her presentation with white count will place on antibiotics.  She is not toxic and is clinically very well-appearing therefore I do believe is appropriate for outpatient follow-up.  Patient agreeable to plan. [PR]    Clinical Course User Index [PR] Willy Eddy, MD     FINAL CLINICAL IMPRESSION(S) / ED DIAGNOSES   Final diagnoses:  Sore throat  Nasal congestion     Rx / DC Orders   ED Discharge Orders          Ordered    doxycycline (VIBRA-TABS) 100 MG tablet  2 times daily        02/03/21 1454             Note:  This document was prepared using Dragon voice recognition software and may include unintentional dictation errors.    Willy Eddy, MD 02/03/21 1459

## 2021-02-03 NOTE — ED Notes (Addendum)
See triage notes for details, pt to ed for neck pain- stiffness and pain in neck/shoulders. Pt also c/o sore throat, states it only hurts when swallowing.  Pt had head CT last week, and to follow up with ophthalmology for pseudotumor/ eval of optic nerve.  States she when bends over she has 10/10 pain, and "feels like im under water" Pt has muffled hearing bilaterally.   Pt is A&ox4.

## 2021-02-03 NOTE — ED Triage Notes (Signed)
Pt report sore throat, body aches and headache for the past few days.  Pt states has stiffness in neck and shoulders that started 2 days ago, was told by neuro to come to ER if this developed, had CT performed out patient.

## 2021-03-06 DIAGNOSIS — R519 Headache, unspecified: Secondary | ICD-10-CM | POA: Diagnosis not present

## 2021-03-06 DIAGNOSIS — Z6841 Body Mass Index (BMI) 40.0 and over, adult: Secondary | ICD-10-CM | POA: Diagnosis not present

## 2021-03-06 DIAGNOSIS — L732 Hidradenitis suppurativa: Secondary | ICD-10-CM | POA: Diagnosis not present

## 2021-03-06 DIAGNOSIS — G932 Benign intracranial hypertension: Secondary | ICD-10-CM | POA: Diagnosis not present

## 2021-03-18 ENCOUNTER — Telehealth (HOSPITAL_COMMUNITY): Payer: Self-pay | Admitting: Psychiatry

## 2021-03-18 ENCOUNTER — Other Ambulatory Visit: Payer: Self-pay

## 2021-03-25 ENCOUNTER — Encounter (HOSPITAL_COMMUNITY): Payer: Self-pay | Admitting: Psychiatry

## 2021-03-25 ENCOUNTER — Other Ambulatory Visit: Payer: Self-pay

## 2021-03-25 ENCOUNTER — Telehealth (INDEPENDENT_AMBULATORY_CARE_PROVIDER_SITE_OTHER): Payer: 59 | Admitting: Psychiatry

## 2021-03-25 DIAGNOSIS — F411 Generalized anxiety disorder: Secondary | ICD-10-CM

## 2021-03-25 DIAGNOSIS — F900 Attention-deficit hyperactivity disorder, predominantly inattentive type: Secondary | ICD-10-CM | POA: Diagnosis not present

## 2021-03-25 MED ORDER — AMPHETAMINE-DEXTROAMPHETAMINE 20 MG PO TABS: 20.0000 mg | ORAL_TABLET | Freq: Two times a day (BID) | ORAL | 0 refills | Status: DC

## 2021-03-25 MED ORDER — CLONAZEPAM 1 MG PO TABS: ORAL_TABLET | ORAL | 2 refills | Status: DC

## 2021-03-25 MED ORDER — ESCITALOPRAM OXALATE 20 MG PO TABS: 20.0000 mg | ORAL_TABLET | Freq: Every day | ORAL | 2 refills | Status: DC

## 2021-03-25 NOTE — Progress Notes (Signed)
Virtual Visit via Video Note  I connected with Kathleen Velasquez on 03/25/21 at 11:40 AM EDT by a video enabled telemedicine application and verified that I am speaking with the correct person using two identifiers.  Location: Patient: home Provider: office   I discussed the limitations of evaluation and management by telemedicine and the availability of in person appointments. The patient expressed understanding and agreed to proceed.     I discussed the assessment and treatment plan with the patient. The patient was provided an opportunity to ask questions and all were answered. The patient agreed with the plan and demonstrated an understanding of the instructions.   The patient was advised to call back or seek an in-person evaluation if the symptoms worsen or if the condition fails to improve as anticipated.  I provided 15 minutes of non-face-to-face time during this encounter.   [name], MD  [facility] MD/PA/NP OP Progress Note  03/25/2021 11:55 AM Kathleen Velasquez  MRN:  [id number]  Chief Complaint:  Chief Complaint  Patient presents with   Depression   Drug Problem   ADHD   Follow-up   HPI: This patient is a 31 year old single white female who lives with her niece in [place].  She works as a Occupational hygienist at [employer].  The patient returns for follow-up after 3 months.  She states that she has had more medical issues.  She has had a lot of sinus headaches.  She got referred to a neurologist.  She had another brain CT which indicated partial empty sella but its not clear that this is related to the headaches.  She was put on propranolol and Indocin which helps to some degree.  She is going to be referred to another neurologist and an ENT.  Overall however her mood has been stable she denies significant depression and anxiety and her focus has been good with the Adderall Visit Diagnosis:    ICD-10-CM   1. Generalized anxiety disorder  F41.1     2. Attention  deficit hyperactivity disorder (ADHD), predominantly inattentive type  F90.0       Past Psychiatric History: none  Past Medical History:  Past Medical History:  Diagnosis Date   Abscess    right buttocks/thigh   Anxiety    Depression    DUB (dysfunctional uterine bleeding) 05/25/2012   Hydradenitis    Hypothyroidism    Polycystic ovaries    Thyroid disease    hypothyroidism    Past Surgical History:  Procedure Laterality Date   ADENOIDECTOMY     BALLOON DILATION N/A 12/19/2019   Procedure: BALLOON DILATION;  Surgeon: Lanelle Bal, DO;  [facility];  Service: Endoscopy;  Laterality: N/A;   BIOPSY  12/19/2019   Procedure: BIOPSY;  Surgeon: Lanelle Bal, DO;  [facility];  Service: Endoscopy;;   CHOLECYSTECTOMY  01/27/2012   Procedure: LAPAROSCOPIC CHOLECYSTECTOMY;  Surgeon: Fabio Bering, MD;  Location: [facility] OR[facility];  Service: General;  Laterality: N/A;   ESOPHAGOGASTRODUODENOSCOPY (EGD) WITH PROPOFOL N/A 12/19/2019   Procedure: ESOPHAGOGASTRODUODENOSCOPY (EGD) WITH PROPOFOL;  Surgeon: Lanelle Bal, DO;  [facility];  Service: Endoscopy;  Laterality: N/A;  3:00pm   HERNIA REPAIR Left    LIH   HYDRADENITIS EXCISION  10/02/2011   Procedure: EXCISION HYDRADENITIS AXILLA;  Surgeon: Fabio Bering, MD;  Location: [facility];  Service: General;  Laterality: Right;  Right Axillary Dissection   HYDRADENITIS EXCISION Left 10/19/2013   Procedure: INCISION  OF HIDRADENITIS SUPPURATIVA LEFT AXILLA;  Surgeon: Marlane Hatcher, MD;  Location: [facility];  Service: General;  Laterality: Left;   HYDRADENITIS EXCISION Left 09/09/2018   Procedure: EXCISION HIDRADENITIS AXILLA, LEFT;  Surgeon: Franky Macho, MD;  Location: [facility];  Service: General;  Laterality: Left;   TONSILLECTOMY      Family Psychiatric History: see below  Family History:  Family History  Problem Relation Age of Onset   Diabetes Mother    Hypertension Mother    Hyperlipidemia Mother     Neuropathy Mother    Diabetes Maternal Grandmother    Hypertension Maternal Grandmother    Heart disease Maternal Grandmother        CHF   COPD Maternal Grandmother    Cirrhosis Maternal Grandmother    Anxiety disorder Maternal Grandmother    Cancer Maternal Grandfather        lung   Diabetes Paternal Grandmother    Diabetes Paternal Grandfather    Aneurysm Paternal Grandfather    Anxiety disorder Paternal Grandfather    Drug abuse Father    ADD / ADHD Brother    Stroke Other    Anxiety disorder Maternal Uncle    Anxiety disorder Paternal Uncle    Depression Paternal Uncle    Anxiety disorder Maternal Aunt     Social History:  Social History   Socioeconomic History   Marital status: Single    Spouse name: Not on file   Number of children: Not on file   Years of education: Not on file   Highest education level: Not on file  Occupational History   Not on file  Tobacco Use   Smoking status: Every Day    Packs/day: 0.25    Years: 7.00    Pack years: 1.75    Types: Cigarettes   Smokeless tobacco: Never  Vaping Use   Vaping Use: Never used  Substance and Sexual Activity   Alcohol use: Yes    Comment: social use    Drug use: No   Sexual activity: Yes    Birth control/protection: None, Implant  Other Topics Concern   Not on file  Social History Narrative   Not on file   Social Determinants of Health   Financial Resource Strain: Not on file  Food Insecurity: Not on file  Transportation Needs: Not on file  Physical Activity: Not on file  Stress: Not on file  Social Connections: Not on file    Allergies:  Allergies  Allergen Reactions   Bactrim Hives and Itching   Ciprofloxacin Hives and Itching    CRNA Discontinued preop antibiotic  IVPB  Cipro in OR after developed itching and hives left arm.  Received Benadryl with relief noted. Dr.Gonzalez and Dr. Leticia Penna informed. DDallasRN   Other Other (See Comments)    [place] causes a hives, swelling, itching,  and anaphylaxis.    Penicillins Hives and Itching    Has patient had a PCN reaction causing immediate rash, facial/tongue/throat swelling, SOB or lightheadedness with hypotension: Yes Has patient had a PCN reaction causing severe rash involving mucus membranes or skin necrosis: No Has patient had a PCN reaction that required hospitalization No Has patient had a PCN reaction occurring within the last 10 years: No If all of the above answers are "NO", then may proceed with Cephalosporin use.    Percocet [Oxycodone-Acetaminophen] Other (See Comments)    Face flushes   Vancomycin Itching    Metabolic Disorder Labs: Lab Results  Component Value Date  HGBA1C 5.7 (H) 05/25/2012   MPG 117 (H) 05/25/2012   No results found for: PROLACTIN No results found for: CHOL, TRIG, HDL, CHOLHDL, VLDL, LDLCALC Lab Results  Component Value Date   TSH 2.515 05/25/2012    Therapeutic Level Labs: No results found for: LITHIUM No results found for: VALPROATE No components found for:  CBMZ  Current Medications: Current Outpatient Medications  Medication Sig Dispense Refill   amphetamine-dextroamphetamine (ADDERALL) 20 MG tablet Take 1 tablet (20 mg total) by mouth 2 (two) times daily. 60 tablet 0   amphetamine-dextroamphetamine (ADDERALL) 20 MG tablet Take 1 tablet (20 mg total) by mouth 2 (two) times daily. 90 tablet 0   amphetamine-dextroamphetamine (ADDERALL) 20 MG tablet Take 1 tablet (20 mg total) by mouth 2 (two) times daily. 60 tablet 0   benzonatate (TESSALON) 100 MG capsule Take 1 capsule (100 mg total) by mouth 3 (three) times daily as needed for cough. (Patient not taking: Reported on 11/29/2020) 30 capsule 0   cetirizine (ZYRTEC ALLERGY) 10 MG tablet Take 1 tablet (10 mg total) by mouth daily. (Patient taking differently: Take 10 mg by mouth daily as needed for allergies. ) 30 tablet 0   chlorpheniramine-HYDROcodone (TUSSIONEX PENNKINETIC ER) 10-8 MG/5ML SUER Take 5 mLs by mouth every 12  (twelve) hours as needed for cough. 60 mL 0   clonazePAM (KLONOPIN) 1 MG tablet TAKE 1 TABLET(1 MG) BY MOUTH TWICE DAILY AS NEEDED FOR ANXIETY 60 tablet 2   escitalopram (LEXAPRO) 20 MG tablet Take 1 tablet (20 mg total) by mouth daily. 90 tablet 2   fluticasone (FLONASE) 50 MCG/ACT nasal spray Place 1 spray into both nostrils daily. (Patient not taking: Reported on 01/22/2021) 18.2 mL 2   ibuprofen (ADVIL) 800 MG tablet Take 1 tablet (800 mg total) by mouth 3 (three) times daily. 21 tablet 0   indomethacin (INDOCIN) 25 MG capsule Take 1 capsule (25 mg total) by mouth as needed (severe headache). 30 capsule 2   meclizine (ANTIVERT) 25 MG tablet Take 1 tablet (25 mg total) by mouth 3 (three) times daily as needed for dizziness. 30 tablet 0   NUVARING 0.12-0.015 MG/24HR vaginal ring INSERT VAGINALLY AND LEAVE IN. PLACE FOR 3 CONSECUTIVE WEEKS, THEN REMOVE FOR 1 WEEK (Patient taking differently: Place 1 each vaginally See admin instructions. PLACE FOR 3 CONSECUTIVE WEEKS, THEN REMOVE FOR 1 WEEK) 1 each 10   omeprazole (PRILOSEC) 20 MG capsule Take 1 capsule (20 mg total) by mouth 2 (two) times daily. 60 capsule 5   promethazine-dextromethorphan (PROMETHAZINE-DM) 6.25-15 MG/5ML syrup Take 5 mLs by mouth at bedtime as needed for cough. 100 mL 0   propranolol (INDERAL) 20 MG tablet Take 1 tablet (20 mg total) by mouth 2 (two) times daily. 60 tablet 2   sodium chloride (OCEAN) 0.65 % SOLN nasal spray Place 1 spray into both nostrils as needed for congestion. (Patient not taking: Reported on 01/22/2021) 88 mL 0   No current facility-administered medications for this visit.     Musculoskeletal: Strength & Muscle Tone: within normal limits Gait & Station: normal Patient leans: N/A  Psychiatric Specialty Exam: Review of Systems  HENT:  Positive for congestion.   Neurological:  Positive for headaches.  All other systems reviewed and are negative.  There were no vitals taken for this visit.There is no  height or weight on file to calculate BMI.  General Appearance: Casual and Fairly Groomed  Eye Contact:  Good  Speech:  Clear and Coherent  Volume:  Normal  Mood:  Euthymic  Affect:  Appropriate and Congruent  Thought Process:  Goal Directed  Orientation:  Full (Time, Place, and Person)  Thought Content: Rumination   Suicidal Thoughts:  No  Homicidal Thoughts:  No  Memory:  Immediate;   Good Recent;   Good Remote;   Good  Judgement:  Good  Insight:  Good  Psychomotor Activity:  Normal  Concentration:  Concentration: Good and Attention Span: Good  Recall:  Good  Fund of Knowledge: Good  Language: Good  Akathisia:  No  Handed:  Right  AIMS (if indicated): not done  Assets:  Communication Skills Desire for Improvement Physical Health Resilience Social Support Talents/Skills  ADL's:  Intact  Cognition: WNL  Sleep:  Good   Screenings: PHQ2-9    Flowsheet Row [facility] Video Visit from 12/24/2020 in [facility] Video Visit from 09/30/2020 in [facility] Video Visit from 07/01/2020 in [facility]  PHQ-2 Total Score 0 0 0 0      Flowsheet Row Video Visit from 03/25/2021 [facility] [facility] from 02/03/2021 in [facility] from 01/13/2021 in [facility] RISK CATEGORY No Risk No Risk No Risk        Assessment and Plan: This patient is a 31 year old female with a history of depression anxiety and ADHD.  Despite her health issues she continues to do well on her current regimen.  She will continue Lexapro 20 mg daily for depression and anxiety, clonazepam 1 mg twice daily for anxiety and Adderall 20 mg twice daily for ADD.  She will return to see me in 3  months  Collaboration of Care: Collaboration of Care: Primary Care Provider AEB chart notes will be provided to PCP at patient's request  Patient/Guardian was advised Release of Information must be obtained prior to any record release in order to collaborate their care with an outside provider. Patient/Guardian was advised if they have not already done so to contact the registration department to sign all necessary forms in order for [place] to release information regarding their care.   Consent: Patient/Guardian gives verbal consent for treatment and assignment of benefits for services provided during this visit. Patient/Guardian expressed understanding and agreed to proceed.    [name], MD 03/25/2021, 11:55 AM

## 2021-04-08 ENCOUNTER — Emergency Department (HOSPITAL_COMMUNITY)
Admission: EM | Admit: 2021-04-08 | Discharge: 2021-04-08 | Disposition: A | Discharge status: Home / Self Care | Payer: BC Managed Care – PPO | Attending: Emergency Medicine | Admitting: Emergency Medicine

## 2021-04-08 ENCOUNTER — Encounter (HOSPITAL_COMMUNITY): Payer: Self-pay | Admitting: *Deleted

## 2021-04-08 ENCOUNTER — Other Ambulatory Visit: Payer: Self-pay

## 2021-04-08 ENCOUNTER — Emergency Department (HOSPITAL_COMMUNITY): Payer: BC Managed Care – PPO

## 2021-04-08 DIAGNOSIS — R7309 Other abnormal glucose: Secondary | ICD-10-CM | POA: Diagnosis not present

## 2021-04-08 DIAGNOSIS — R7401 Elevation of levels of liver transaminase levels: Secondary | ICD-10-CM | POA: Insufficient documentation

## 2021-04-08 DIAGNOSIS — R7989 Other specified abnormal findings of blood chemistry: Secondary | ICD-10-CM | POA: Insufficient documentation

## 2021-04-08 DIAGNOSIS — R1011 Right upper quadrant pain: Secondary | ICD-10-CM | POA: Diagnosis not present

## 2021-04-08 DIAGNOSIS — Z20822 Contact with and (suspected) exposure to covid-19: Secondary | ICD-10-CM | POA: Insufficient documentation

## 2021-04-08 DIAGNOSIS — R197 Diarrhea, unspecified: Secondary | ICD-10-CM | POA: Diagnosis not present

## 2021-04-08 DIAGNOSIS — R059 Cough, unspecified: Secondary | ICD-10-CM | POA: Insufficient documentation

## 2021-04-08 DIAGNOSIS — M545 Low back pain, unspecified: Secondary | ICD-10-CM | POA: Diagnosis not present

## 2021-04-08 DIAGNOSIS — R748 Abnormal levels of other serum enzymes: Secondary | ICD-10-CM | POA: Diagnosis not present

## 2021-04-08 DIAGNOSIS — R112 Nausea with vomiting, unspecified: Secondary | ICD-10-CM | POA: Insufficient documentation

## 2021-04-08 DIAGNOSIS — R1013 Epigastric pain: Secondary | ICD-10-CM | POA: Insufficient documentation

## 2021-04-08 DIAGNOSIS — D72829 Elevated white blood cell count, unspecified: Secondary | ICD-10-CM | POA: Insufficient documentation

## 2021-04-08 DIAGNOSIS — R000 Tachycardia, unspecified: Secondary | ICD-10-CM | POA: Diagnosis not present

## 2021-04-08 DIAGNOSIS — R109 Unspecified abdominal pain: Secondary | ICD-10-CM | POA: Diagnosis not present

## 2021-04-08 LAB — CBC WITH DIFFERENTIAL/PLATELET
Abs Immature Granulocytes: 0.04 10*3/uL (ref 0.00–0.07)
Basophils Absolute: 0 10*3/uL (ref 0.0–0.1)
Basophils Relative: 0 %
Eosinophils Absolute: 0 10*3/uL (ref 0.0–0.5)
Eosinophils Relative: 0 %
HCT: 48.1 % — ABNORMAL HIGH (ref 36.0–46.0)
Hemoglobin: 15.9 g/dL — ABNORMAL HIGH (ref 12.0–15.0)
Immature Granulocytes: 0 %
Lymphocytes Relative: 15 %
Lymphs Abs: 1.5 10*3/uL (ref 0.7–4.0)
MCH: 30.9 pg (ref 26.0–34.0)
MCHC: 33.1 g/dL (ref 30.0–36.0)
MCV: 93.4 fL (ref 80.0–100.0)
Monocytes Absolute: 0.6 10*3/uL (ref 0.1–1.0)
Monocytes Relative: 6 %
Neutro Abs: 8.4 10*3/uL — ABNORMAL HIGH (ref 1.7–7.7)
Neutrophils Relative %: 79 %
Platelets: 300 10*3/uL (ref 150–400)
RBC: 5.15 MIL/uL — ABNORMAL HIGH (ref 3.87–5.11)
RDW: 13 % (ref 11.5–15.5)
WBC: 10.6 10*3/uL — ABNORMAL HIGH (ref 4.0–10.5)
nRBC: 0 % (ref 0.0–0.2)

## 2021-04-08 LAB — RESP PANEL BY RT-PCR (FLU A&B, COVID) ARPGX2
Influenza A by PCR: NEGATIVE
Influenza B by PCR: NEGATIVE
SARS Coronavirus 2 by RT PCR: NEGATIVE

## 2021-04-08 LAB — URINALYSIS, ROUTINE W REFLEX MICROSCOPIC
Bilirubin Urine: NEGATIVE
Glucose, UA: NEGATIVE mg/dL
Ketones, ur: NEGATIVE mg/dL
Nitrite: NEGATIVE
Protein, ur: 30 mg/dL — AB
Specific Gravity, Urine: 1.027 (ref 1.005–1.030)
pH: 5 (ref 5.0–8.0)

## 2021-04-08 LAB — COMPREHENSIVE METABOLIC PANEL
ALT: 67 U/L — ABNORMAL HIGH (ref 0–44)
AST: 97 U/L — ABNORMAL HIGH (ref 15–41)
Albumin: 4 g/dL (ref 3.5–5.0)
Alkaline Phosphatase: 102 U/L (ref 38–126)
Anion gap: 13 (ref 5–15)
BUN: 15 mg/dL (ref 6–20)
CO2: 21 mmol/L — ABNORMAL LOW (ref 22–32)
Calcium: 8.8 mg/dL — ABNORMAL LOW (ref 8.9–10.3)
Chloride: 104 mmol/L (ref 98–111)
Creatinine, Ser: 0.79 mg/dL (ref 0.44–1.00)
GFR, Estimated: >60 mL/min (ref >60)
Glucose, Bld: 118 mg/dL — ABNORMAL HIGH (ref 70–99)
Potassium: 3.7 mmol/L (ref 3.5–5.1)
Sodium: 138 mmol/L (ref 135–145)
Total Bilirubin: 0.9 mg/dL (ref 0.3–1.2)
Total Protein: 7.4 g/dL (ref 6.5–8.1)

## 2021-04-08 LAB — HCG, SERUM, QUALITATIVE: Preg, Serum: NEGATIVE

## 2021-04-08 LAB — LIPASE, BLOOD: Lipase: 123 U/L — ABNORMAL HIGH (ref 11–51)

## 2021-04-08 MED ORDER — SODIUM CHLORIDE 0.9 % IV BOLUS
1000.0000 mL | Freq: Once | INTRAVENOUS | Status: AC
  Administered 2021-04-08: 1000 mL via INTRAVENOUS

## 2021-04-08 MED ORDER — METOCLOPRAMIDE HCL 10 MG PO TABS: 10.0000 mg | ORAL_TABLET | Freq: Three times a day (TID) | ORAL | 0 refills | Status: DC | PRN

## 2021-04-08 MED ORDER — METOCLOPRAMIDE HCL 5 MG/ML IJ SOLN
5.0000 mg | Freq: Once | INTRAMUSCULAR | Status: AC
  Administered 2021-04-08: 5 mg via INTRAVENOUS
  Filled 2021-04-08: qty 2

## 2021-04-08 MED ORDER — IOHEXOL 300 MG/ML  SOLN
100.0000 mL | Freq: Once | INTRAMUSCULAR | Status: AC | PRN
  Administered 2021-04-08: 100 mL via INTRAVENOUS

## 2021-04-08 NOTE — Discharge Instructions (Addendum)
I prescribed you a nausea medicine called Reglan.  This is the same medication that you were given in the emergency department that improved your symptoms.  You can take this every 8 hours as needed for significant nausea/vomiting that you cannot control.  Please only take this as prescribed.  Do not take Zofran when taking this medication. ? ?Your liver function tests were also mildly elevated during this visit as well.  When following up with your primary doctor please have these numbers rechecked. ? ?Please continue to monitor your symptoms closely.  If you develop any new or worsening symptoms please come back to the emergency department. ?

## 2021-04-08 NOTE — ED Triage Notes (Signed)
Nausea last night at first then emesis and diarrhea.  Took zofran last night and this morning.  ?

## 2021-04-08 NOTE — ED Provider Notes (Signed)
?[place] ?Provider Note ? ? ?CSN: [id number] ?Arrival date & time: 04/08/21  1245 ? ?  ? ?History ? ?Chief Complaint  ?Patient presents with  ? Abdominal Pain  ? ? ?Kathleen Velasquez is a 31 y.o. female. ? ?HPI ?Patient is a 31 year old female who is status post cholecystectomy who presents to the emergency department due to nausea, vomiting, diarrhea, as well as abdominal pain.  Patient states that she initially became nauseous yesterday afternoon.  She took a Zofran with mild relief but her symptoms then began to worsen yesterday evening.  She reports persistent nausea, vomiting, and diarrhea.  Shortly thereafter she then began developing upper abdominal pain.  Denies any hematemesis or hematochezia.  States she has been unable to tolerate any p.o. intake due to her symptoms.  States that she is status post cholecystectomy but feels that her pain is consistent with prior cholecystitis.  Denies any chest pain, shortness of breath, or urinary complaints.  She does note a mild cough for the past 2 days but otherwise denies any URI symptoms.  Denies any regular alcohol use. ?  ? ?Home Medications ?Prior to Admission medications   ?Medication Sig Start Date End Date Taking? Authorizing Provider  ?amphetamine-dextroamphetamine (ADDERALL) 20 MG tablet Take 1 tablet (20 mg total) by mouth 2 (two) times daily. 03/25/21 03/25/22 Yes Cloria Spring, MD  ?cetirizine (ZYRTEC ALLERGY) 10 MG tablet Take 1 tablet (10 mg total) by mouth daily. ?Patient taking differently: Take 10 mg by mouth daily as needed for allergies. 08/31/19  Yes Avegno, Darrelyn Hillock, FNP  ?clonazePAM (KLONOPIN) 1 MG tablet TAKE 1 TABLET(1 MG) BY MOUTH TWICE DAILY AS NEEDED FOR ANXIETY 03/25/21  Yes Cloria Spring, MD  ?doxycycline (VIBRAMYCIN) 100 MG capsule Take 100 mg by mouth 2 (two) times daily. 03/06/21  Yes [provider]  ?escitalopram (LEXAPRO) 20 MG tablet Take 1 tablet (20 mg total) by mouth daily. 03/25/21 03/25/22 Yes  Cloria Spring, MD  ?ibuprofen (ADVIL) 800 MG tablet Take 1 tablet (800 mg total) by mouth 3 (three) times daily. 11/29/20  Yes Isla Pence, MD  ?indomethacin (INDOCIN) 25 MG capsule Take 1 capsule (25 mg total) by mouth as needed (severe headache). 01/22/21  Yes Genia Harold, MD  ?meclizine (ANTIVERT) 25 MG tablet Take 1 tablet (25 mg total) by mouth 3 (three) times daily as needed for dizziness. 11/29/20  Yes Isla Pence, MD  ?metoCLOPramide (REGLAN) 10 MG tablet Take 1 tablet (10 mg total) by mouth every 8 (eight) hours as needed for nausea. 04/08/21  Yes Rayna Sexton, PA-C  ?NUVARING 0.12-0.015 MG/24HR vaginal ring INSERT VAGINALLY AND LEAVE IN. PLACE FOR 3 CONSECUTIVE WEEKS, THEN REMOVE FOR 1 WEEK ?Patient taking differently: Place 1 each vaginally See admin instructions. PLACE FOR 3 CONSECUTIVE WEEKS, THEN REMOVE FOR 1 WEEK 11/28/15  Yes Constant, Peggy, MD  ?omeprazole (PRILOSEC) 20 MG capsule Take 1 capsule (20 mg total) by mouth 2 (two) times daily. 12/19/19 04/08/21 Yes Carver, Elon Alas, DO  ?propranolol (INDERAL) 20 MG tablet Take 1 tablet (20 mg total) by mouth 2 (two) times daily. 01/22/21  Yes Genia Harold, MD  ?traMADol (ULTRAM) 50 MG tablet Take 50 mg by mouth every 6 (six) hours. 11/11/20  Yes [provider]  ?WEGOVY 0.25 MG/0.5ML SOAJ Inject 0.25 mg into the skin once a week. Friday 04/02/21  Yes [provider]  ?acetaZOLAMIDE ER (DIAMOX) 500 MG capsule Take 500 mg by mouth every 12 (twelve) hours. 03/07/21  [provider]  ?amphetamine-dextroamphetamine (ADDERALL) 20 MG tablet Take 1 tablet (20 mg total) by mouth 2 (two) times daily. ?Patient not taking: Reported on 04/08/2021 03/25/21 03/25/22  Myrlene Broker, MD  ?amphetamine-dextroamphetamine (ADDERALL) 20 MG tablet Take 1 tablet (20 mg total) by mouth 2 (two) times daily. ?Patient not taking: Reported on 04/08/2021 03/25/21 03/25/22  Myrlene Broker, MD  ?benzonatate (TESSALON) 100 MG capsule Take 1  capsule (100 mg total) by mouth 3 (three) times daily as needed for cough. ?Patient not taking: Reported on 11/29/2020 12/25/19   Durward Parcel, FNP  ?chlorpheniramine-HYDROcodone (TUSSIONEX PENNKINETIC ER) 10-8 MG/5ML SUER Take 5 mLs by mouth every 12 (twelve) hours as needed for cough. ?Patient not taking: Reported on 04/08/2021 11/05/20   Domenick Gong, MD  ?fluticasone [facility]) 50 MCG/ACT nasal spray Place 1 spray into both nostrils daily. ?Patient not taking: Reported on 01/22/2021 11/29/20   Jacalyn Lefevre, MD  ?meloxicam (MOBIC) 7.5 MG tablet Take 15 mg by mouth daily. ?Patient not taking: Reported on 04/08/2021 11/11/20   [provider]  ?methylPREDNISolone (MEDROL DOSEPAK) 4 MG TBPK tablet See admin instructions. follow package directions ?Patient not taking: Reported on 04/08/2021 03/06/21   [provider]  ?promethazine-dextromethorphan (PROMETHAZINE-DM) 6.25-15 MG/5ML syrup Take 5 mLs by mouth at bedtime as needed for cough. ?Patient not taking: Reported on 04/08/2021 01/13/21   Wallis Bamberg, PA-C  ?sodium chloride (OCEAN) 0.65 % SOLN nasal spray Place 1 spray into both nostrils as needed for congestion. ?Patient not taking: Reported on 01/22/2021 11/29/20   Jacalyn Lefevre, MD  ?   ? ?Allergies    ?Bactrim, Ciprofloxacin, Other, Penicillins, Percocet [oxycodone-acetaminophen], and Vancomycin   ? ?Review of Systems   ?Review of Systems  ?All other systems reviewed and are negative. ?Ten systems reviewed and are negative for acute change, except as noted in the HPI.   ?Physical Exam ?Updated Vital Signs ?BP 138/81   Pulse 94   Temp 99.3 ?F (37.4 ?C) (Oral)   Resp (!) 21   Ht 5\' 5"  (1.651 m)   Wt 109.3 kg   SpO2 96%   BMI 40.10 kg/m?  ?Physical Exam ?Vitals and nursing note reviewed.  ?Constitutional:   ?   General: She is not in acute distress. ?   Appearance: Normal appearance. She is not ill-appearing, toxic-appearing or diaphoretic.  ?HENT:  ?   Head: Normocephalic and  atraumatic.  ?   Right Ear: External ear normal.  ?   Left Ear: External ear normal.  ?   Nose: Nose normal.  ?   Mouth/Throat:  ?   Mouth: Mucous membranes are moist.  ?   Pharynx: Oropharynx is clear. No oropharyngeal exudate or posterior oropharyngeal erythema.  ?Eyes:  ?   Extraocular Movements: Extraocular movements intact.  ?Cardiovascular:  ?   Rate and Rhythm: Regular rhythm. Tachycardia present.  ?   Pulses: Normal pulses.  ?   Heart sounds: Normal heart sounds. No murmur heard. ?  No friction rub. No gallop.  ?Pulmonary:  ?   Effort: Pulmonary effort is normal. No respiratory distress.  ?   Breath sounds: Normal breath sounds. No stridor. No wheezing, rhonchi or rales.  ?Abdominal:  ?   General: Abdomen is flat.  ?   Palpations: Abdomen is soft.  ?   Tenderness: There is abdominal tenderness in the right upper quadrant and epigastric area.  ?   Comments: Protuberant abdomen that is soft.  Moderate tenderness noted along the epigastrium  and right upper quadrant.  Additional mild right CVA tenderness.  No left CVA tenderness.  ?Musculoskeletal:     ?   General: Normal range of motion.  ?   Cervical back: Normal range of motion and neck supple. No tenderness.  ?Skin: ?   General: Skin is warm and dry.  ?Neurological:  ?   General: No focal deficit present.  ?   Mental Status: She is alert and oriented to person, place, and time.  ?Psychiatric:     ?   Mood and Affect: Mood normal.     ?   Behavior: Behavior normal.  ? ?ED Results / Procedures / Treatments   ?Labs ?(all labs ordered are listed, but only abnormal results are displayed) ?Labs Reviewed  ?COMPREHENSIVE METABOLIC PANEL - Abnormal; Notable for the following components:  ?    Result Value  ? CO2 21 (*)   ? Glucose, Bld 118 (*)   ? Calcium 8.8 (*)   ? AST 97 (*)   ? ALT 67 (*)   ? All other components within normal limits  ?CBC WITH DIFFERENTIAL/PLATELET - Abnormal; Notable for the following components:  ? WBC 10.6 (*)   ? RBC 5.15 (*)   ? Hemoglobin  15.9 (*)   ? HCT 48.1 (*)   ? Neutro Abs 8.4 (*)   ? All other components within normal limits  ?LIPASE, BLOOD - Abnormal; Notable for the following components:  ? Lipase 123 (*)   ? All other components within normal li

## 2021-04-08 NOTE — ED Notes (Signed)
Patient was able to handle drinking water. Does not feel sick. ?

## 2021-04-09 DIAGNOSIS — G932 Benign intracranial hypertension: Secondary | ICD-10-CM | POA: Diagnosis not present

## 2021-04-09 DIAGNOSIS — E039 Hypothyroidism, unspecified: Secondary | ICD-10-CM | POA: Diagnosis not present

## 2021-04-09 DIAGNOSIS — F330 Major depressive disorder, recurrent, mild: Secondary | ICD-10-CM | POA: Diagnosis not present

## 2021-04-09 DIAGNOSIS — Z0001 Encounter for general adult medical examination with abnormal findings: Secondary | ICD-10-CM | POA: Diagnosis not present

## 2021-04-09 DIAGNOSIS — Z6841 Body Mass Index (BMI) 40.0 and over, adult: Secondary | ICD-10-CM | POA: Diagnosis not present

## 2021-04-09 DIAGNOSIS — R7309 Other abnormal glucose: Secondary | ICD-10-CM | POA: Diagnosis not present

## 2021-04-09 DIAGNOSIS — Z13220 Encounter for screening for lipoid disorders: Secondary | ICD-10-CM | POA: Diagnosis not present

## 2021-04-09 DIAGNOSIS — L732 Hidradenitis suppurativa: Secondary | ICD-10-CM | POA: Diagnosis not present

## 2021-04-09 DIAGNOSIS — K529 Noninfective gastroenteritis and colitis, unspecified: Secondary | ICD-10-CM | POA: Diagnosis not present

## 2021-04-09 DIAGNOSIS — H8110 Benign paroxysmal vertigo, unspecified ear: Secondary | ICD-10-CM | POA: Diagnosis not present

## 2021-04-09 DIAGNOSIS — Z1331 Encounter for screening for depression: Secondary | ICD-10-CM | POA: Diagnosis not present

## 2021-04-09 DIAGNOSIS — R109 Unspecified abdominal pain: Secondary | ICD-10-CM | POA: Diagnosis not present

## 2021-04-10 ENCOUNTER — Encounter (HOSPITAL_COMMUNITY): Payer: Self-pay | Admitting: Emergency Medicine

## 2021-04-10 ENCOUNTER — Emergency Department (HOSPITAL_COMMUNITY)
Admission: EM | Admit: 2021-04-10 | Discharge: 2021-04-10 | Disposition: A | Discharge status: Home / Self Care | Payer: BC Managed Care – PPO | Attending: Emergency Medicine | Admitting: Emergency Medicine

## 2021-04-10 ENCOUNTER — Other Ambulatory Visit: Payer: Self-pay

## 2021-04-10 DIAGNOSIS — M545 Low back pain, unspecified: Secondary | ICD-10-CM | POA: Insufficient documentation

## 2021-04-10 DIAGNOSIS — R109 Unspecified abdominal pain: Secondary | ICD-10-CM | POA: Insufficient documentation

## 2021-04-10 DIAGNOSIS — F17210 Nicotine dependence, cigarettes, uncomplicated: Secondary | ICD-10-CM | POA: Insufficient documentation

## 2021-04-10 DIAGNOSIS — R112 Nausea with vomiting, unspecified: Secondary | ICD-10-CM | POA: Insufficient documentation

## 2021-04-10 DIAGNOSIS — E039 Hypothyroidism, unspecified: Secondary | ICD-10-CM | POA: Diagnosis not present

## 2021-04-10 DIAGNOSIS — R197 Diarrhea, unspecified: Secondary | ICD-10-CM | POA: Insufficient documentation

## 2021-04-10 LAB — URINALYSIS, ROUTINE W REFLEX MICROSCOPIC
Bilirubin Urine: NEGATIVE
Glucose, UA: NEGATIVE mg/dL
Ketones, ur: NEGATIVE mg/dL
Nitrite: NEGATIVE
Protein, ur: NEGATIVE mg/dL
Specific Gravity, Urine: 1.006 (ref 1.005–1.030)
pH: 7 (ref 5.0–8.0)

## 2021-04-10 MED ORDER — NAPROXEN 250 MG PO TABS
500.0000 mg | ORAL_TABLET | Freq: Once | ORAL | Status: AC
  Administered 2021-04-10: 500 mg via ORAL
  Filled 2021-04-10: qty 2

## 2021-04-10 MED ORDER — METHOCARBAMOL 500 MG PO TABS: 500.0000 mg | ORAL_TABLET | Freq: Three times a day (TID) | ORAL | 0 refills | Status: DC | PRN

## 2021-04-10 NOTE — ED Provider Notes (Signed)
?[facility] ?[facility] Emergency Department ?Provider Note ?MRN:  [id number]  ?Arrival date & time: 04/10/21    ? ?Chief Complaint   ?Back pain ?History of Present Illness   ?Kathleen Velasquez is a 31 y.o. year-old female with no pertinent past medical history presenting to the ED with chief complaint of back pain. ? ?Patient explains she is recovering from a GI illness.  Had nausea vomiting and diarrhea with abdominal pain, was evaluated here in the emergency department a few days ago.  Overall seems to be doing better, still with some mild diarrhea, no further vomiting.  Abdominal pain is improved.  Here for back pain, lower lumbar pain worse with motion that is new.  No fever, no numbness or weakness to the arms or legs, no bowel or bladder dysfunction. ? ?Review of Systems  ?A thorough review of systems was obtained and all systems are negative except as noted in the HPI and PMH.  ? ?Patient's Health History   ? ?Past Medical History:  ?Diagnosis Date  ? Abscess   ? right buttocks/thigh  ? Anxiety   ? Depression   ? DUB (dysfunctional uterine bleeding) 05/25/2012  ? Hydradenitis   ? Hypothyroidism   ? Polycystic ovaries   ? Thyroid disease   ? hypothyroidism  ?  ?Past Surgical History:  ?Procedure Laterality Date  ? ADENOIDECTOMY    ? BALLOON DILATION N/A 12/19/2019  ? Procedure: BALLOON DILATION;  Surgeon: Lanelle Bal, DO;  Location: [facility];  Service: Endoscopy;  Laterality: N/A;  ? BIOPSY  12/19/2019  ? Procedure: BIOPSY;  Surgeon: Lanelle Bal, DO;  Location: [facility];  Service: Endoscopy;;  ? CHOLECYSTECTOMY  01/27/2012  ? Procedure: LAPAROSCOPIC CHOLECYSTECTOMY;  Surgeon: Fabio Bering, MD;  Location: [facility] ORS;  Service: General;  Laterality: N/A;  ? ESOPHAGOGASTRODUODENOSCOPY (EGD) WITH PROPOFOL N/A 12/19/2019  ? Procedure: ESOPHAGOGASTRODUODENOSCOPY (EGD) WITH PROPOFOL;  Surgeon: Lanelle Bal, DO;  Location: [facility];  Service: Endoscopy;  Laterality: N/A;  3:00pm   ? HERNIA REPAIR Left   ? LIH  ? HYDRADENITIS EXCISION  10/02/2011  ? Procedure: EXCISION HYDRADENITIS AXILLA;  Surgeon: Fabio Bering, MD;  Location: [facility];  Service: General;  Laterality: Right;  Right Axillary Dissection  ? HYDRADENITIS EXCISION Left 10/19/2013  ? Procedure: INCISION OF HIDRADENITIS SUPPURATIVA LEFT AXILLA;  Surgeon: Marlane Hatcher, MD;  Location: [facility];  Service: General;  Laterality: Left;  ? HYDRADENITIS EXCISION Left 09/09/2018  ? Procedure: EXCISION HIDRADENITIS AXILLA, LEFT;  Surgeon: Franky Macho, MD;  Location: [facility];  Service: General;  Laterality: Left;  ? TONSILLECTOMY    ?  ?Family History  ?Problem Relation Age of Onset  ? Diabetes Mother   ? Hypertension Mother   ? Hyperlipidemia Mother   ? Neuropathy Mother   ? Diabetes Maternal Grandmother   ? Hypertension Maternal Grandmother   ? Heart disease Maternal Grandmother   ?     CHF  ? COPD Maternal Grandmother   ? Cirrhosis Maternal Grandmother   ? Anxiety disorder Maternal Grandmother   ? Cancer Maternal Grandfather   ?     lung  ? Diabetes Paternal Grandmother   ? Diabetes Paternal Grandfather   ? Aneurysm Paternal Grandfather   ? Anxiety disorder Paternal Grandfather   ? Drug abuse Father   ? ADD / ADHD Brother   ? Stroke Other   ? Anxiety disorder Maternal Uncle   ? Anxiety disorder Paternal Uncle   ?  Depression Paternal Uncle   ? Anxiety disorder Maternal Aunt   ?  ?Social History  ? ?Socioeconomic History  ? Marital status: Single  ?  Spouse name: Not on file  ? Number of children: Not on file  ? Years of education: Not on file  ? Highest education level: Not on file  ?Occupational History  ? Not on file  ?Tobacco Use  ? Smoking status: Every Day  ?  Packs/day: 0.25  ?  Years: 7.00  ?  Pack years: 1.75  ?  Types: Cigarettes  ? Smokeless tobacco: Never  ?Vaping Use  ? Vaping Use: Never used  ?Substance and Sexual Activity  ? Alcohol use: Yes  ?  Comment: social use   ? Drug use: No  ? Sexual activity: Yes  ?  Birth  control/protection: None, Implant  ?Other Topics Concern  ? Not on file  ?Social History Narrative  ? Not on file  ? ?Social Determinants of Health  ? ?Financial Resource Strain: Not on file  ?Food Insecurity: Not on file  ?Transportation Needs: Not on file  ?Physical Activity: Not on file  ?Stress: Not on file  ?Social Connections: Not on file  ?Intimate Partner Violence: Not on file  ?  ? ?Physical Exam  ? ?Vitals:  ? 04/10/21 0121  ?BP: (!) 133/95  ?Pulse: 83  ?Resp: 20  ?Temp: 98.7 ?F (37.1 ?C)  ?SpO2: 98%  ?  ?CONSTITUTIONAL: Well-appearing, NAD ?NEURO/PSYCH:  Alert and oriented x 3, no focal deficits ?EYES:  eyes equal and reactive ?ENT/NECK:  no LAD, no JVD ?CARDIO: Regular rate, well-perfused, normal S1 and S2 ?PULM:  CTAB no wheezing or rhonchi ?GI/GU:  non-distended, non-tender ?MSK/SPINE:  No gross deformities, no edema ?SKIN:  no rash, atraumatic ? ? ?*Additional and/or pertinent findings included in MDM below ? ?Diagnostic and Interventional Summary  ? ? EKG Interpretation ? ?Date/Time:    ?Ventricular Rate:    ?PR Interval:    ?QRS Duration:   ?QT Interval:    ?QTC Calculation:   ?R Axis:     ?Text Interpretation:   ?  ? ?  ? ?Labs Reviewed  ?URINALYSIS, ROUTINE W REFLEX MICROSCOPIC - Abnormal; Notable for the following components:  ?    Result Value  ? Color, Urine STRAW (*)   ? Hgb urine dipstick MODERATE (*)   ? Leukocytes,Ua TRACE (*)   ? Bacteria, UA FEW (*)   ? All other components within normal limits  ?  ?No orders to display  ?  ?Medications  ?naproxen (NAPROSYN) tablet 500 mg (500 mg Oral Given 04/10/21 0209)  ?  ? ?Procedures  /  Critical Care ?Procedures ? ?ED Course and Medical Decision Making  ?Initial Impression and Ddx ?Favoring MSK back pain, no red flag symptoms to suggest myelopathy.  Less likely pyelonephritis given that the pain is lower and not really at the costovertebral angle.  Benign abdomen, doubt kidney stone, I see no indication for repeat imaging.  Patient appropriate for  discharge with symptomatic management at home. ? ?Past medical/surgical history that increases complexity of ED encounter: None ? ?Interpretation of Diagnostics ?Urinalysis overall reassuring, will send for culture. ? ?Patient Reassessment and Ultimate Disposition/Management ?Discharge home ? ?Patient management required discussion with the following services or consulting groups:  None ? ?Complexity of Problems Addressed ?Acute illness or injury that poses threat of life of bodily function ? ?Additional Data Reviewed and Analyzed ?Further history obtained from: ?None ? ?Additional Factors Impacting ED  Encounter Risk ?None ? ?Elmer SowMichael M. Pilar PlateBero, MD ?[facility] ?[facility] [email or web address] ? ?Final Clinical Impressions(s) / ED Diagnoses  ? ?  ICD-10-CM   ?1. Acute low back pain, unspecified back pain laterality, unspecified whether sciatica present  M54.50   ?  ?  ?ED Discharge Orders   ? ?      Ordered  ?  methocarbamol (ROBAXIN) 500 MG tablet  Every 8 hours PRN       ? 04/10/21 0259  ? ?  ?  ? ?  ?  ? ?Discharge Instructions Discussed with and Provided to Patient:  ? ? ? ?Discharge Instructions   ? ?  ?You were evaluated in the Emergency Department and after careful evaluation, we did not find any emergent condition requiring admission or further testing in the hospital. ? ?Your exam/testing today was overall reassuring.  Symptoms may be due to a muscular strain or spasm.  Recommend continued use of Tylenol or Motrin at home for discomfort.  Can use the Robaxin muscle relaxer as needed for more significant pain.  Please use caution as this medication can cause drowsiness. ? ?Please return to the Emergency Department if you experience any worsening of your condition.  Thank you for allowing us to be a part of your care. ? ? ? ? ? ?  ?Sabas SousBero, Irine Heminger M, MD ?04/10/21 0300 ? ?

## 2021-04-10 NOTE — ED Triage Notes (Signed)
Pt to ed pov. C/o Diarrhea and abdominal pain that started this past Monday night. Pt was in [facility] 2 days ago and is back due to not getting better. Pt was at pcp this morning.  ?

## 2021-04-10 NOTE — Discharge Instructions (Signed)
You were evaluated in the Emergency Department and after careful evaluation, we did not find any emergent condition requiring admission or further testing in the hospital. ? ?Your exam/testing today was overall reassuring.  Symptoms may be due to a muscular strain or spasm.  Recommend continued use of Tylenol or Motrin at home for discomfort.  Can use the Robaxin muscle relaxer as needed for more significant pain.  Please use caution as this medication can cause drowsiness. ? ?Please return to the Emergency Department if you experience any worsening of your condition.  Thank you for allowing [place] to be a part of your care. ? ?

## 2021-05-01 ENCOUNTER — Ambulatory Visit: Payer: 59 | Admitting: Psychiatry

## 2021-05-09 DIAGNOSIS — G932 Benign intracranial hypertension: Secondary | ICD-10-CM | POA: Diagnosis not present

## 2021-05-09 DIAGNOSIS — E669 Obesity, unspecified: Secondary | ICD-10-CM | POA: Diagnosis not present

## 2021-05-09 DIAGNOSIS — E039 Hypothyroidism, unspecified: Secondary | ICD-10-CM | POA: Diagnosis not present

## 2021-05-21 ENCOUNTER — Other Ambulatory Visit (HOSPITAL_COMMUNITY): Payer: Self-pay | Admitting: Psychiatry

## 2021-05-21 MED ORDER — AMPHETAMINE-DEXTROAMPHETAMINE 20 MG PO TABS: 20.0000 mg | ORAL_TABLET | Freq: Two times a day (BID) | ORAL | 0 refills | Status: DC

## 2021-06-16 ENCOUNTER — Encounter (HOSPITAL_COMMUNITY): Payer: Self-pay | Admitting: Psychiatry

## 2021-06-16 ENCOUNTER — Telehealth (INDEPENDENT_AMBULATORY_CARE_PROVIDER_SITE_OTHER): Payer: Self-pay | Admitting: Psychiatry

## 2021-06-16 DIAGNOSIS — F900 Attention-deficit hyperactivity disorder, predominantly inattentive type: Secondary | ICD-10-CM

## 2021-06-16 DIAGNOSIS — F411 Generalized anxiety disorder: Secondary | ICD-10-CM

## 2021-06-16 MED ORDER — AMPHETAMINE-DEXTROAMPHETAMINE 20 MG PO TABS: 20.0000 mg | ORAL_TABLET | Freq: Two times a day (BID) | ORAL | 0 refills | Status: DC

## 2021-06-16 MED ORDER — ESCITALOPRAM OXALATE 20 MG PO TABS: 20.0000 mg | ORAL_TABLET | Freq: Every day | ORAL | 2 refills | Status: DC

## 2021-06-16 MED ORDER — CLONAZEPAM 1 MG PO TABS: ORAL_TABLET | ORAL | 2 refills | Status: DC

## 2021-06-16 NOTE — Progress Notes (Signed)
Virtual Visit via Video Note  I connected with Kathleen Velasquez on 06/16/21 at  2:00 PM EDT by a video enabled telemedicine application and verified that I am speaking with the correct person using two identifiers.  Location: Patient: home Provider: office   I discussed the limitations of evaluation and management by telemedicine and the availability of in person appointments. The patient expressed understanding and agreed to proceed.      I discussed the assessment and treatment plan with the patient. The patient was provided an opportunity to ask questions and all were answered. The patient agreed with the plan and demonstrated an understanding of the instructions.   The patient was advised to call back or seek an in-person evaluation if the symptoms worsen or if the condition fails to improve as anticipated.  I provided 15 minutes of non-face-to-face time during this encounter.   [name], MD  [facility] MD/PA/NP OP Progress Note  06/16/2021 2:38 PM Kathleen Velasquez  MRN:  [id number]  Chief Complaint:  Chief Complaint  Patient presents with   Depression   Anxiety   ADD   Follow-up   HPI: This patient is a 31 year old single white female who lives with her niece in [place].  She works as a Occupational hygienist at [employer].  The patient returns for follow-up after 3 months regarding her anxiety depression and ADHD.  Overall she is doing somewhat better.  She is having less headaches.  She is now on [facility] and has lost 20 pounds.  She states that her mood is generally good and her anxiety is well controlled.  She is focusing well with the Adderall XR. Visit Diagnosis:    ICD-10-CM   1. Generalized anxiety disorder  F41.1     2. Attention deficit hyperactivity disorder (ADHD), predominantly inattentive type  F90.0       Past Psychiatric History: none  Past Medical History:  Past Medical History:  Diagnosis Date   Abscess    right buttocks/thigh   Anxiety     Depression    DUB (dysfunctional uterine bleeding) 05/25/2012   Hydradenitis    Hypothyroidism    Polycystic ovaries    Thyroid disease    hypothyroidism    Past Surgical History:  Procedure Laterality Date   ADENOIDECTOMY     BALLOON DILATION N/A 12/19/2019   Procedure: BALLOON DILATION;  Surgeon: Lanelle Bal, DO;  [facility];  Service: Endoscopy;  Laterality: N/A;   BIOPSY  12/19/2019   Procedure: BIOPSY;  Surgeon: Lanelle Bal, DO;  [facility];  Service: Endoscopy;;   CHOLECYSTECTOMY  01/27/2012   Procedure: LAPAROSCOPIC CHOLECYSTECTOMY;  Surgeon: Fabio Bering, MD;  Location: [facility] ORS;  Service: General;  Laterality: N/A;   ESOPHAGOGASTRODUODENOSCOPY (EGD) WITH PROPOFOL N/A 12/19/2019   Procedure: ESOPHAGOGASTRODUODENOSCOPY (EGD) WITH PROPOFOL;  Surgeon: Lanelle Bal, DO;  Location[facility];  Service: Endoscopy;  Laterality: N/A;  3:00pm   HERNIA REPAIR Left    LIH   HYDRADENITIS EXCISION  10/02/2011   Procedure: EXCISION HYDRADENITIS AXILLA;  Surgeon: Fabio Bering, MD;  Location: [facility] ORS;  Service: General;  Laterality: Right;  Right Axillary Dissection   HYDRADENITIS EXCISION Left 10/19/2013   Procedure: INCISION OF HIDRADENITIS SUPPURATIVA LEFT AXILLA;  Surgeon: Marlane Hatcher, MD;  Location: [facility] ORS;  Service: General;  Laterality: Left;   HYDRADENITIS EXCISION Left 09/09/2018   Procedure: EXCISION HIDRADENITIS AXILLA, LEFT;  Surgeon: Franky Macho, MD;  Location: [facility]  ORS;  Service: General;  Laterality: Left;   TONSILLECTOMY      Family Psychiatric History: see below  Family History:  Family History  Problem Relation Age of Onset   Diabetes Mother    Hypertension Mother    Hyperlipidemia Mother    Neuropathy Mother    Diabetes Maternal Grandmother    Hypertension Maternal Grandmother    Heart disease Maternal Grandmother        CHF   COPD Maternal Grandmother    Cirrhosis Maternal Grandmother    Anxiety disorder  Maternal Grandmother    Cancer Maternal Grandfather        lung   Diabetes Paternal Grandmother    Diabetes Paternal Grandfather    Aneurysm Paternal Grandfather    Anxiety disorder Paternal Grandfather    Drug abuse Father    ADD / ADHD Brother    Stroke Other    Anxiety disorder Maternal Uncle    Anxiety disorder Paternal Uncle    Depression Paternal Uncle    Anxiety disorder Maternal Aunt     Social History:  Social History   Socioeconomic History   Marital status: Single    Spouse name: Not on file   Number of children: Not on file   Years of education: Not on file   Highest education level: Not on file  Occupational History   Not on file  Tobacco Use   Smoking status: Every Day    Packs/day: 0.25    Years: 7.00    Pack years: 1.75    Types: Cigarettes   Smokeless tobacco: Never  Vaping Use   Vaping Use: Never used  Substance and Sexual Activity   Alcohol use: Yes    Comment: social use    Drug use: No   Sexual activity: Yes    Birth control/protection: None, Implant  Other Topics Concern   Not on file  Social History Narrative   Not on file   Social Determinants of Health   Financial Resource Strain: Not on file  Food Insecurity: Not on file  Transportation Needs: Not on file  Physical Activity: Not on file  Stress: Not on file  Social Connections: Not on file    Allergies:  Allergies  Allergen Reactions   Bactrim Hives and Itching   Ciprofloxacin Hives and Itching    CRNA Discontinued preop antibiotic  IVPB  Cipro in OR after developed itching and hives left arm.  Received Benadryl with relief noted. Dr.Gonzalez and Dr. Leticia Penna informed. DDallasRN   Other Other (See Comments)    [place] causes a hives, swelling, itching, and anaphylaxis.    Penicillins Hives and Itching    Has patient had a PCN reaction causing immediate rash, facial/tongue/throat swelling, SOB or lightheadedness with hypotension: Yes Has patient had a PCN reaction causing  severe rash involving mucus membranes or skin necrosis: No Has patient had a PCN reaction that required hospitalization No Has patient had a PCN reaction occurring within the last 10 years: No If all of the above answers are "NO", then may proceed with Cephalosporin use.    Percocet [Oxycodone-Acetaminophen] Other (See Comments)    Face flushes   Vancomycin Itching    Metabolic Disorder Labs: Lab Results  Component Value Date   HGBA1C 5.7 (H) 05/25/2012   MPG 117 (H) 05/25/2012   No results found for: PROLACTIN No results found for: CHOL, TRIG, HDL, CHOLHDL, VLDL, LDLCALC Lab Results  Component Value Date   TSH 2.515 05/25/2012  Therapeutic Level Labs: No results found for: LITHIUM No results found for: VALPROATE No components found for:  CBMZ  Current Medications: Current Outpatient Medications  Medication Sig Dispense Refill   acetaZOLAMIDE ER (DIAMOX) 500 MG capsule Take 500 mg by mouth every 12 (twelve) hours.     amphetamine-dextroamphetamine (ADDERALL) 20 MG tablet Take 1 tablet (20 mg total) by mouth 2 (two) times daily. 60 tablet 0   amphetamine-dextroamphetamine (ADDERALL) 20 MG tablet Take 1 tablet (20 mg total) by mouth 2 (two) times daily. 60 tablet 0   amphetamine-dextroamphetamine (ADDERALL) 20 MG tablet Take 1 tablet (20 mg total) by mouth 2 (two) times daily. 60 tablet 0   cetirizine (ZYRTEC ALLERGY) 10 MG tablet Take 1 tablet (10 mg total) by mouth daily. (Patient taking differently: Take 10 mg by mouth daily as needed for allergies.) 30 tablet 0   clonazePAM (KLONOPIN) 1 MG tablet TAKE 1 TABLET(1 MG) BY MOUTH TWICE DAILY AS NEEDED FOR ANXIETY 60 tablet 2   doxycycline (VIBRAMYCIN) 100 MG capsule Take 100 mg by mouth 2 (two) times daily.     escitalopram (LEXAPRO) 20 MG tablet Take 1 tablet (20 mg total) by mouth daily. 90 tablet 2   ibuprofen (ADVIL) 800 MG tablet Take 1 tablet (800 mg total) by mouth 3 (three) times daily. 21 tablet 0   indomethacin  (INDOCIN) 25 MG capsule Take 1 capsule (25 mg total) by mouth as needed (severe headache). 30 capsule 2   meclizine (ANTIVERT) 25 MG tablet Take 1 tablet (25 mg total) by mouth 3 (three) times daily as needed for dizziness. 30 tablet 0   methocarbamol (ROBAXIN) 500 MG tablet Take 1 tablet (500 mg total) by mouth every 8 (eight) hours as needed for muscle spasms. 30 tablet 0   metoCLOPramide (REGLAN) 10 MG tablet Take 1 tablet (10 mg total) by mouth every 8 (eight) hours as needed for nausea. 8 tablet 0   NUVARING 0.12-0.015 MG/24HR vaginal ring INSERT VAGINALLY AND LEAVE IN. PLACE FOR 3 CONSECUTIVE WEEKS, THEN REMOVE FOR 1 WEEK (Patient taking differently: Place 1 each vaginally See admin instructions. PLACE FOR 3 CONSECUTIVE WEEKS, THEN REMOVE FOR 1 WEEK) 1 each 10   omeprazole (PRILOSEC) 20 MG capsule Take 1 capsule (20 mg total) by mouth 2 (two) times daily. 60 capsule 5   promethazine-dextromethorphan (PROMETHAZINE-DM) 6.25-15 MG/5ML syrup Take 5 mLs by mouth at bedtime as needed for cough. (Patient not taking: Reported on 04/08/2021) 100 mL 0   propranolol (INDERAL) 20 MG tablet Take 1 tablet (20 mg total) by mouth 2 (two) times daily. 60 tablet 2   sodium chloride (OCEAN) 0.65 % SOLN nasal spray Place 1 spray into both nostrils as needed for congestion. (Patient not taking: Reported on 01/22/2021) 88 mL 0   traMADol (ULTRAM) 50 MG tablet Take 50 mg by mouth every 6 (six) hours.     WEGOVY 0.25 MG/0.5ML SOAJ Inject 0.25 mg into the skin once a week. Friday     No current facility-administered medications for this visit.     Musculoskeletal: Strength & Muscle Tone: within normal limits Gait & Station: normal Patient leans: N/A  Psychiatric Specialty Exam: Review of Systems  Neurological:  Positive for headaches.  All other systems reviewed and are negative.  There were no vitals taken for this visit.There is no height or weight on file to calculate BMI.  General Appearance: Casual and  Fairly Groomed  Eye Contact:  Good  Speech:  Clear and Coherent  Volume:  Normal  Mood:  Euthymic  Affect:  Appropriate and Congruent  Thought Process:  Goal Directed  Orientation:  Full (Time, Place, and Person)  Thought Content: WDL   Suicidal Thoughts:  No  Homicidal Thoughts:  No  Memory:  Immediate;   Good Recent;   Good Remote;   Good  Judgement:  Good  Insight:  Fair  Psychomotor Activity:  Normal  Concentration:  Concentration: Good and Attention Span: Good  Recall:  Good  Fund of Knowledge: Good  Language: Good  Akathisia:  No  Handed:  Right  AIMS (if indicated): not done  Assets:  Communication Skills Desire for Improvement Physical Health Resilience Social Support Talents/Skills  ADL's:  Intact  Cognition: WNL  Sleep:  Good   Screenings: PHQ2-9    Flowsheet Row [facility] Video Visit from 03/25/2021 in [facility] Video Visit from 12/24/2020 in [facility] Video Visit from 09/30/2020 in [facility] Video Visit from 07/01/2020 in [facility]  PHQ-2 Total Score 0 0 0 0 0      Flowsheet Row Video Visit [facility]/16[facility]2021 [facility]  C-SSRS RISK CATEGORY No Risk No Risk No Risk        Assessment and Plan: This patient is a 31 year old female with a history of depression anxiety and ADHD.  She continues to do well on her current regimen.  She will continue Lexapro 20 mg daily for depression and anxiety, clonazepam 1 mg twice daily for anxiety and Adderall 20 mg twice daily for ADD.  She will return to see me in 3 months  Collaboration of Care: Collaboration of Care: Primary  Care Provider AEB notes will be shared with PCP at patient's request  Patient/Guardian was advised Release of Information must be obtained prior to any record release in order to collaborate their care with an outside provider. Patient/Guardian was advised if they have not already done so to contact the registration department to sign all necessary forms in order for us to release information regarding their care.   Consent: Patient/Guardian gives verbal consent for treatment and assignment of benefits for services provided during this visit. Patient/Guardian expressed understanding and agreed to proceed.    Diannia Rudereborah Trek Kimball, MD 06/16/2021, 2:38 PM

## 2021-06-23 DIAGNOSIS — G4482 Headache associated with sexual activity: Secondary | ICD-10-CM | POA: Diagnosis not present

## 2021-06-23 DIAGNOSIS — G44221 Chronic tension-type headache, intractable: Secondary | ICD-10-CM | POA: Diagnosis not present

## 2021-06-23 DIAGNOSIS — G4733 Obstructive sleep apnea (adult) (pediatric): Secondary | ICD-10-CM | POA: Diagnosis not present

## 2021-06-23 DIAGNOSIS — E668 Other obesity: Secondary | ICD-10-CM | POA: Diagnosis not present

## 2021-06-24 DIAGNOSIS — Z6841 Body Mass Index (BMI) 40.0 and over, adult: Secondary | ICD-10-CM | POA: Diagnosis not present

## 2021-06-24 DIAGNOSIS — E039 Hypothyroidism, unspecified: Secondary | ICD-10-CM | POA: Diagnosis not present

## 2021-06-24 DIAGNOSIS — R519 Headache, unspecified: Secondary | ICD-10-CM | POA: Diagnosis not present

## 2021-06-24 DIAGNOSIS — E063 Autoimmune thyroiditis: Secondary | ICD-10-CM | POA: Diagnosis not present

## 2021-06-24 DIAGNOSIS — G932 Benign intracranial hypertension: Secondary | ICD-10-CM | POA: Diagnosis not present

## 2021-07-08 ENCOUNTER — Other Ambulatory Visit (HOSPITAL_COMMUNITY): Payer: Self-pay | Admitting: Psychiatry

## 2021-07-22 ENCOUNTER — Telehealth (HOSPITAL_COMMUNITY): Payer: Self-pay

## 2021-07-22 NOTE — Telephone Encounter (Signed)
Medication management - Telephone call with patient to question if she was wanting to change pharmacies to [facility] after receiving prescription requests for 90 day orders for her Clonazepam and Escitalopram.  Requested pt call our office back if she was wanting to change pharmacies or if she just wanted to do this with next visit on 09/08/21 as she still has refill orders at her current [facility].

## 2021-08-01 ENCOUNTER — Emergency Department (HOSPITAL_COMMUNITY)
Admission: EM | Admit: 2021-08-01 | Discharge: 2021-08-01 | Disposition: A | Discharge status: Home / Self Care | Payer: BC Managed Care – PPO | Attending: Emergency Medicine | Admitting: Emergency Medicine

## 2021-08-01 ENCOUNTER — Other Ambulatory Visit: Payer: Self-pay

## 2021-08-01 ENCOUNTER — Encounter (HOSPITAL_COMMUNITY): Payer: Self-pay | Admitting: Emergency Medicine

## 2021-08-01 DIAGNOSIS — R112 Nausea with vomiting, unspecified: Secondary | ICD-10-CM | POA: Diagnosis not present

## 2021-08-01 DIAGNOSIS — E039 Hypothyroidism, unspecified: Secondary | ICD-10-CM | POA: Diagnosis not present

## 2021-08-01 DIAGNOSIS — R197 Diarrhea, unspecified: Secondary | ICD-10-CM | POA: Insufficient documentation

## 2021-08-01 DIAGNOSIS — F17210 Nicotine dependence, cigarettes, uncomplicated: Secondary | ICD-10-CM | POA: Diagnosis not present

## 2021-08-01 LAB — LIPASE, BLOOD: Lipase: 37 U/L (ref 11–51)

## 2021-08-01 LAB — COMPREHENSIVE METABOLIC PANEL
ALT: 53 U/L — ABNORMAL HIGH (ref 0–44)
AST: 75 U/L — ABNORMAL HIGH (ref 15–41)
Albumin: 3.8 g/dL (ref 3.5–5.0)
Alkaline Phosphatase: 103 U/L (ref 38–126)
Anion gap: 7 (ref 5–15)
BUN: 12 mg/dL (ref 6–20)
CO2: 20 mmol/L — ABNORMAL LOW (ref 22–32)
Calcium: 8.6 mg/dL — ABNORMAL LOW (ref 8.9–10.3)
Chloride: 111 mmol/L (ref 98–111)
Creatinine, Ser: 0.76 mg/dL (ref 0.44–1.00)
GFR, Estimated: >60 mL/min (ref >60)
Glucose, Bld: 110 mg/dL — ABNORMAL HIGH (ref 70–99)
Potassium: 3.3 mmol/L — ABNORMAL LOW (ref 3.5–5.1)
Sodium: 138 mmol/L (ref 135–145)
Total Bilirubin: 0.6 mg/dL (ref 0.3–1.2)
Total Protein: 7.1 g/dL (ref 6.5–8.1)

## 2021-08-01 LAB — CBC
HCT: 44.8 % (ref 36.0–46.0)
Hemoglobin: 15.1 g/dL — ABNORMAL HIGH (ref 12.0–15.0)
MCH: 30.8 pg (ref 26.0–34.0)
MCHC: 33.7 g/dL (ref 30.0–36.0)
MCV: 91.4 fL (ref 80.0–100.0)
Platelets: 316 10*3/uL (ref 150–400)
RBC: 4.9 MIL/uL (ref 3.87–5.11)
RDW: 13.2 % (ref 11.5–15.5)
WBC: 13.3 10*3/uL — ABNORMAL HIGH (ref 4.0–10.5)
nRBC: 0 % (ref 0.0–0.2)

## 2021-08-01 LAB — PROTIME-INR
INR: 1 (ref 0.8–1.2)
Prothrombin Time: 12.5 seconds (ref 11.4–15.2)

## 2021-08-01 MED ORDER — ONDANSETRON HCL 4 MG/2ML IJ SOLN
4.0000 mg | Freq: Once | INTRAMUSCULAR | Status: AC
  Administered 2021-08-01: 4 mg via INTRAVENOUS
  Filled 2021-08-01: qty 2

## 2021-08-01 MED ORDER — ONDANSETRON 4 MG PO TBDP: 4.0000 mg | ORAL_TABLET | Freq: Three times a day (TID) | ORAL | 0 refills | Status: DC | PRN

## 2021-08-01 MED ORDER — SODIUM CHLORIDE 0.9 % IV BOLUS
1000.0000 mL | Freq: Once | INTRAVENOUS | Status: AC
  Administered 2021-08-01: 1000 mL via INTRAVENOUS

## 2021-08-01 MED ORDER — DIPHENHYDRAMINE HCL 50 MG/ML IJ SOLN
25.0000 mg | Freq: Once | INTRAMUSCULAR | Status: AC
  Administered 2021-08-01: 25 mg via INTRAVENOUS
  Filled 2021-08-01: qty 1

## 2021-08-01 MED ORDER — PROCHLORPERAZINE EDISYLATE 10 MG/2ML IJ SOLN
10.0000 mg | Freq: Once | INTRAMUSCULAR | Status: AC
Start: 2021-08-01 — End: 2021-08-01
  Administered 2021-08-01: 10 mg via INTRAVENOUS
  Filled 2021-08-01: qty 2

## 2021-08-01 MED ORDER — PROCHLORPERAZINE MALEATE 10 MG PO TABS: 10.0000 mg | ORAL_TABLET | Freq: Two times a day (BID) | ORAL | 0 refills | Status: DC | PRN

## 2021-08-01 NOTE — ED Provider Notes (Signed)
[facility] Emergency Department Provider Note MRN:  [id number]  Arrival date & time: 08/01/21     Chief Complaint   Nausea vomiting diarrhea History of Present Illness   Kathleen Velasquez is a 31 y.o. year-old female with a history of polycystic ovaries presenting to the ED with chief complaint of nausea vomiting diarrhea.  Nausea vomiting and diarrhea for the past 2 or 3 days.  Trouble taking oral medications today, increased vomiting today, felt like the stool was a bit dark and felt like she vomited a bit of blood today.  No abdominal pain.  Review of Systems  A thorough review of systems was obtained and all systems are negative except as noted in the HPI and PMH.   Patient's Health History    Past Medical History:  Diagnosis Date   Abscess    right buttocks/thigh   Anxiety    Depression    DUB (dysfunctional uterine bleeding) 05/25/2012   Hydradenitis    Hypothyroidism    Polycystic ovaries    Thyroid disease    hypothyroidism    Past Surgical History:  Procedure Laterality Date   ADENOIDECTOMY     BALLOON DILATION N/A 12/19/2019   Procedure: BALLOON DILATION;  Surgeon: Lanelle Bal, DO;  [facility];  Service: Endoscopy;  Laterality: N/A;   BIOPSY  12/19/2019   Procedure: BIOPSY;  Surgeon: Lanelle Bal, DO;  [facility];  Service: Endoscopy;;   CHOLECYSTECTOMY  01/27/2012   Procedure: LAPAROSCOPIC CHOLECYSTECTOMY;  Surgeon: Fabio Bering, MD;  Location: [facility] OR[facility];  Service: General;  Laterality: N/A;   ESOPHAGOGASTRODUODENOSCOPY (EGD) WITH PROPOFOL N/A 12/19/2019   Procedure: ESOPHAGOGASTRODUODENOSCOPY (EGD) WITH PROPOFOL;  Surgeon: Lanelle Bal, DO;  [facility];  Service: Endoscopy;  Laterality: N/A;  3:00pm   HERNIA REPAIR Left    LIH   HYDRADENITIS EXCISION  10/02/2011   Procedure: EXCISION HYDRADENITIS AXILLA;  Surgeon: Fabio Bering, MD;  Location: [facility];  Service: General;  Laterality:  Right;  Right Axillary Dissection   HYDRADENITIS EXCISION Left 10/19/2013   Procedure: INCISION OF HIDRADENITIS SUPPURATIVA LEFT AXILLA;  Surgeon: Marlane Hatcher, MD;  Location: [facility];  Service: General;  Laterality: Left;   HYDRADENITIS EXCISION Left 09/09/2018   Procedure: EXCISION HIDRADENITIS AXILLA, LEFT;  Surgeon: Franky Macho, MD;  Location: [facility];  Service: General;  Laterality: Left;   TONSILLECTOMY      Family History  Problem Relation Age of Onset   Diabetes Mother    Hypertension Mother    Hyperlipidemia Mother    Neuropathy Mother    Diabetes Maternal Grandmother    Hypertension Maternal Grandmother    Heart disease Maternal Grandmother        CHF   COPD Maternal Grandmother    Cirrhosis Maternal Grandmother    Anxiety disorder Maternal Grandmother    Cancer Maternal Grandfather        lung   Diabetes Paternal Grandmother    Diabetes Paternal Grandfather    Aneurysm Paternal Grandfather    Anxiety disorder Paternal Grandfather    Drug abuse Father    ADD / ADHD Brother    Stroke Other    Anxiety disorder Maternal Uncle    Anxiety disorder Paternal Uncle    Depression Paternal Uncle    Anxiety disorder Maternal Aunt     Social History   Socioeconomic History   Marital status: Single    Spouse name: Not on file  Number of children: Not on file   Years of education: Not on file   Highest education level: Not on file  Occupational History   Not on file  Tobacco Use   Smoking status: Every Day    Packs/day: 0.50    Years: 7.00    Total pack years: 3.50    Types: Cigarettes   Smokeless tobacco: Never  Vaping Use   Vaping Use: Never used  Substance and Sexual Activity   Alcohol use: Yes    Comment: social use    Drug use: No   Sexual activity: Yes    Birth control/protection: None, Implant  Other Topics Concern   Not on file  Social History Narrative   Not on file   Social Determinants of Health   Financial Resource Strain: Not on file   Food Insecurity: Not on file  Transportation Needs: Not on file  Physical Activity: Not on file  Stress: Not on file  Social Connections: Not on file  Intimate Partner Violence: Not on file     Physical Exam   Vitals:   08/01/21 0451 08/01/21 0505  BP:    Pulse: 73 72  Resp:    Temp:    SpO2: 98% 100%    CONSTITUTIONAL: Well-appearing, NAD NEURO/PSYCH:  Alert and oriented x 3, no focal deficits EYES:  eyes equal and reactive ENT/NECK:  no LAD, no JVD CARDIO: Regular rate, well-perfused, normal S1 and S2 PULM:  CTAB no wheezing or rhonchi GI/GU:  non-distended, non-tender MSK/SPINE:  No gross deformities, no edema SKIN:  no rash, atraumatic   *Additional and/or pertinent findings included in MDM below  Diagnostic and Interventional Summary    EKG Interpretation  Date/Time:    Ventricular Rate:    PR Interval:    QRS Duration:   QT Interval:    QTC Calculation:   R Axis:     Text Interpretation:         Labs Reviewed  CBC - Abnormal; Notable for the following components:      Result Value   WBC 13.3 (*)    Hemoglobin 15.1 (*)    All other components within normal limits  COMPREHENSIVE METABOLIC PANEL - Abnormal; Notable for the following components:   Potassium 3.3 (*)    CO2 20 (*)    Glucose, Bld 110 (*)    Calcium 8.6 (*)    AST 75 (*)    ALT 53 (*)    All other components within normal limits  LIPASE, BLOOD  PROTIME-INR    No orders to display    Medications  sodium chloride 0.9 % bolus 1,000 mL (0 mLs Intravenous Stopped 08/01/21 0541)  ondansetron (ZOFRAN) injection 4 mg (4 mg Intravenous Given 08/01/21 0443)  prochlorperazine (COMPAZINE) injection 10 mg (10 mg Intravenous Given 08/01/21 0536)  diphenhydrAMINE (BENADRYL) injection 25 mg (25 mg Intravenous Given 08/01/21 0540)     Procedures  /  Critical Care Procedures  ED Course and Medical Decision Making  Initial Impression and Ddx Abdomen is soft and nontender with no rebound  guarding or rigidity, vital signs are normal, overall favoring viral gastroenteritis, possibly with a Mallory-Weiss tear in the setting of multiple episodes of vomiting.  Providing fluids, Zofran, will check labs and reassess.  Past medical/surgical history that increases complexity of ED encounter: None  Interpretation of Diagnostics I personally reviewed the laboratory assessment and my interpretation is as follows: No significant blood count or electrolyte disturbance.  Minimal leukocytosis, very mild  hypokalemia, very mild lipase elevation of unclear significance.    Patient Reassessment and Ultimate Disposition/Management     Patient feeling much better after medications listed above, appropriate for discharge with return precautions.  Patient management required discussion with the following services or consulting groups:  None  Complexity of Problems Addressed Acute illness or injury that poses threat of life of bodily function  Additional Data Reviewed and Analyzed Further history obtained from: None  Additional Factors Impacting ED Encounter Risk Prescriptions  Elmer Sow. Pilar Plate, MD [facility] Emergency Medicine [facility] [email or web address]  Final Clinical Impressions(s) / ED Diagnoses     ICD-10-CM   1. Nausea vomiting and diarrhea  R11.2    R19.7       ED Discharge Orders          Ordered    prochlorperazine (COMPAZINE) 10 MG tablet  2 times daily PRN        08/01/21 0622    ondansetron (ZOFRAN-ODT) 4 MG disintegrating tablet  Every 8 hours PRN        08/01/21 0622             Discharge Instructions Discussed with and Provided to Patient:     Discharge Instructions      You were evaluated in the Emergency Department and after careful evaluation, we did not find any emergent condition requiring admission or further testing in the hospital.  Your exam/testing today is overall reassuring.  Symptoms likely due to a viral  gastroenteritis.  Use the Zofran or Compazine as needed for nausea, drink plenty of fluids and get plenty of rest.  Please return to the Emergency Department if you experience any worsening of your condition.   Thank you for allowing [place] to be a part of your care.       Sabas Sous, MD 08/01/21 [phone]

## 2021-08-01 NOTE — Discharge Instructions (Signed)
You were evaluated in the Emergency Department and after careful evaluation, we did not find any emergent condition requiring admission or further testing in the hospital.  Your exam/testing today is overall reassuring.  Symptoms likely due to a viral gastroenteritis.  Use the Zofran or Compazine as needed for nausea, drink plenty of fluids and get plenty of rest.  Please return to the Emergency Department if you experience any worsening of your condition.   Thank you for allowing [place] to be a part of your care.

## 2021-08-01 NOTE — ED Triage Notes (Signed)
C/o d/v/n. Pt states has Watery black stools. Emesis had a small blood clot in it. Abdominal pain. Pt states diarrhea started Wednesday. N/v started tonight. Pt denies fever

## 2021-08-01 NOTE — ED Notes (Signed)
Pt ambulated to the bathroom unassisted. C/o diarrhea

## 2021-08-04 DIAGNOSIS — R109 Unspecified abdominal pain: Secondary | ICD-10-CM | POA: Diagnosis not present

## 2021-08-30 ENCOUNTER — Ambulatory Visit (INDEPENDENT_AMBULATORY_CARE_PROVIDER_SITE_OTHER): Payer: BC Managed Care – PPO

## 2021-08-30 ENCOUNTER — Ambulatory Visit
Admission: EM | Admit: 2021-08-30 | Discharge: 2021-08-30 | Disposition: A | Discharge status: Home / Self Care | Payer: BC Managed Care – PPO | Attending: Nurse Practitioner | Admitting: Nurse Practitioner

## 2021-08-30 DIAGNOSIS — S29019A Strain of muscle and tendon of unspecified wall of thorax, initial encounter: Secondary | ICD-10-CM

## 2021-08-30 DIAGNOSIS — M546 Pain in thoracic spine: Secondary | ICD-10-CM

## 2021-08-30 MED ORDER — IBUPROFEN 800 MG PO TABS: 800.0000 mg | ORAL_TABLET | Freq: Three times a day (TID) | ORAL | 0 refills | Status: DC | PRN

## 2021-08-30 MED ORDER — METHOCARBAMOL 500 MG PO TABS
500.0000 mg | ORAL_TABLET | Freq: Two times a day (BID) | ORAL | 0 refills | Status: DC
Start: 2021-08-30 — End: 2023-02-01

## 2021-08-30 NOTE — ED Provider Notes (Signed)
[facility]    CSN: [id number] Arrival date & time: 08/30/21  1410      History   Chief Complaint Chief Complaint  Patient presents with   Back Pain    HPI Kathleen Velasquez is a 31 y.o. female.   The history is provided by the patient.   Patient presents for complaints of left-sided mid back pain.  Symptoms have been present for the past 2 weeks after patient lifted her 95 pound dog into the truck.  Patient states since her symptoms started, they have waxed and waned, but it worsened over the past week.  Patient states today when she was at work she had climbed up on a rolling cart and was pushing a 4 pound box.  She states as she pushed the box, she felt pain in the left middle back.  She states that she started to come down the steps, the pain in her mid back became more sharp.  She states that the pain becomes so intense that it "takes my breath away".  Pain worsens with bending, twisting, and sudden movement.  She denies numbness, tingling, radiation of pain, shortness of breath, or difficulty breathing.  She has not taken anything other than Tylenol for her symptoms.  Past Medical History:  Diagnosis Date   Abscess    right buttocks/thigh   Anxiety    Depression    DUB (dysfunctional uterine bleeding) 05/25/2012   Hydradenitis    Hypothyroidism    Polycystic ovaries    Thyroid disease    hypothyroidism    Patient Active Problem List   Diagnosis Date Noted   Left axillary hidradenitis    Anovulatory (dysfunctional uterine) bleeding 06/07/2012   DUB (dysfunctional uterine bleeding) 05/25/2012   FOOT PAIN, CHRONIC 05/09/2007    Past Surgical History:  Procedure Laterality Date   ADENOIDECTOMY     BALLOON DILATION N/A 12/19/2019   Procedure: BALLOON DILATION;  Surgeon: Lanelle Bal, DO;  Location: [facility];  Service: Endoscopy;  Laterality: N/A;   BIOPSY  12/19/2019   Procedure: BIOPSY;  Surgeon: Lanelle Bal, DO;  Location: [facility];   Service: Endoscopy;;   CHOLECYSTECTOMY  01/27/2012   Procedure: LAPAROSCOPIC CHOLECYSTECTOMY;  Surgeon: Fabio Bering, MD;  Location: [facility];  Service: General;  Laterality: N/A;   ESOPHAGOGASTRODUODENOSCOPY (EGD) WITH PROPOFOL N/A 12/19/2019   Procedure: ESOPHAGOGASTRODUODENOSCOPY (EGD) WITH PROPOFOL;  Surgeon: Lanelle Bal, DO;  Location: [facility];  Service: Endoscopy;  Laterality: N/A;  3:00pm   HERNIA REPAIR Left    LIH   HYDRADENITIS EXCISION  10/02/2011   Procedure: EXCISION HYDRADENITIS AXILLA;  Surgeon: Fabio Bering, MD;  Location: [facility];  Service: General;  Laterality: Right;  Right Axillary Dissection   HYDRADENITIS EXCISION Left 10/19/2013   Procedure: INCISION OF HIDRADENITIS SUPPURATIVA LEFT AXILLA;  Surgeon: Marlane Hatcher, MD;  Location: [facility];  Service: General;  Laterality: Left;   HYDRADENITIS EXCISION Left 09/09/2018   Procedure: EXCISION HIDRADENITIS AXILLA, LEFT;  Surgeon: Franky Macho, MD;  Location: [facility];  Service: General;  Laterality: Left;   TONSILLECTOMY      OB History     Gravida  0   Para      Term      Preterm      AB      Living  0      SAB      IAB      Ectopic  Multiple      Live Births               Home Medications    Prior to Admission medications   Medication Sig Start Date End Date Taking? Authorizing Provider  ibuprofen (ADVIL) 800 MG tablet Take 1 tablet (800 mg total) by mouth every 8 (eight) hours as needed. 08/30/21  Yes Shonda Mandarino-Warren, Sadie Haber, NP  methocarbamol (ROBAXIN) 500 MG tablet Take 1 tablet (500 mg total) by mouth 2 (two) times daily. 08/30/21  Yes Anthony Tamburo-Warren, Sadie Haber, NP  acetaZOLAMIDE ER (DIAMOX) 500 MG capsule Take 500 mg by mouth every 12 (twelve) hours. 03/07/21   [provider]  amphetamine-dextroamphetamine (ADDERALL) 20 MG tablet Take 1 tablet (20 mg total) by mouth 2 (two) times daily. 06/16/21 06/16/22  Myrlene Broker, MD  amphetamine-dextroamphetamine (ADDERALL) 20  MG tablet Take 1 tablet (20 mg total) by mouth 2 (two) times daily. 06/16/21 06/16/22  Myrlene Broker, MD  amphetamine-dextroamphetamine (ADDERALL) 20 MG tablet Take 1 tablet (20 mg total) by mouth 2 (two) times daily. 06/16/21 06/16/22  Myrlene Broker, MD  cetirizine (ZYRTEC ALLERGY) 10 MG tablet Take 1 tablet (10 mg total) by mouth daily. Patient taking differently: Take 10 mg by mouth daily as needed for allergies. 08/31/19   Avegno, Zachery Dakins, FNP  clonazePAM (KLONOPIN) 1 MG tablet TAKE 1 TABLET(1 MG) BY MOUTH TWICE DAILY AS NEEDED FOR ANXIETY 06/16/21   Myrlene Broker, MD  doxycycline (VIBRAMYCIN) 100 MG capsule Take 100 mg by mouth 2 (two) times daily. 03/06/21   [provider]  escitalopram (LEXAPRO) 20 MG tablet Take 1 tablet (20 mg total) by mouth daily. 06/16/21 06/16/22  Myrlene Broker, MD  indomethacin (INDOCIN) 25 MG capsule Take 1 capsule (25 mg total) by mouth as needed (severe headache). 01/22/21   Ocie Doyne, MD  meclizine (ANTIVERT) 25 MG tablet Take 1 tablet (25 mg total) by mouth 3 (three) times daily as needed for dizziness. 11/29/20   Jacalyn Lefevre, MD  metoCLOPramide (REGLAN) 10 MG tablet Take 1 tablet (10 mg total) by mouth every 8 (eight) hours as needed for nausea. 04/08/21   Placido Sou, PA-C  NUVARING 0.12-0.015 MG/24HR vaginal ring INSERT VAGINALLY AND LEAVE IN. PLACE FOR 3 CONSECUTIVE WEEKS, THEN REMOVE FOR 1 WEEK Patient taking differently: Place 1 each vaginally See admin instructions. PLACE FOR 3 CONSECUTIVE WEEKS, THEN REMOVE FOR 1 WEEK 11/28/15   Constant, Peggy, MD  omeprazole (PRILOSEC) 20 MG capsule Take 1 capsule (20 mg total) by mouth 2 (two) times daily. 12/19/19 04/08/21  Lanelle Bal, DO  ondansetron (ZOFRAN-ODT) 4 MG disintegrating tablet Take 1 tablet (4 mg total) by mouth every 8 (eight) hours as needed for nausea or vomiting. 08/01/21   Sabas Sous, MD  prochlorperazine (COMPAZINE) 10 MG tablet Take 1 tablet (10 mg total) by mouth 2 (two)  times daily as needed for nausea. 08/01/21   Sabas Sous, MD  promethazine-dextromethorphan (PROMETHAZINE-DM) 6.25-15 MG/5ML syrup Take 5 mLs by mouth at bedtime as needed for cough. Patient not taking: Reported on 04/08/2021 01/13/21   Wallis Bamberg, PA-C  propranolol (INDERAL) 20 MG tablet Take 1 tablet (20 mg total) by mouth 2 (two) times daily. 01/22/21   Ocie Doyne, MD  sodium chloride (OCEAN) 0.65 % SOLN nasal spray Place 1 spray into both nostrils as needed for congestion. Patient not taking: Reported on 01/22/2021 11/29/20   Jacalyn Lefevre, MD  traMADol Janean Sark) 50 MG tablet  Take 50 mg by mouth every 6 (six) hours. 11/11/20   [provider]  WEGOVY 0.25 MG/0.5ML SOAJ Inject 0.25 mg into the skin once a week. Friday 04/02/21   [provider]    Family History Family History  Problem Relation Age of Onset   Diabetes Mother    Hypertension Mother    Hyperlipidemia Mother    Neuropathy Mother    Diabetes Maternal Grandmother    Hypertension Maternal Grandmother    Heart disease Maternal Grandmother        CHF   COPD Maternal Grandmother    Cirrhosis Maternal Grandmother    Anxiety disorder Maternal Grandmother    Cancer Maternal Grandfather        lung   Diabetes Paternal Grandmother    Diabetes Paternal Grandfather    Aneurysm Paternal Grandfather    Anxiety disorder Paternal Grandfather    Drug abuse Father    ADD / ADHD Brother    Stroke Other    Anxiety disorder Maternal Uncle    Anxiety disorder Paternal Uncle    Depression Paternal Uncle    Anxiety disorder Maternal Aunt     Social History Social History   Tobacco Use   Smoking status: Every Day    Packs/day: 0.50    Years: 7.00    Total pack years: 3.50    Types: Cigarettes   Smokeless tobacco: Never  Vaping Use   Vaping Use: Never used  Substance Use Topics   Alcohol use: Yes    Comment: social use    Drug use: No     Allergies   Bactrim, Ciprofloxacin, Other, Penicillins,  Percocet [oxycodone-acetaminophen], and Vancomycin   Review of Systems Review of Systems Per HPI  Physical Exam Triage Vital Signs ED Triage Vitals  Enc Vitals Group     BP 08/30/21 1448 131/84     Pulse Rate 08/30/21 1448 73     Resp 08/30/21 1448 16     Temp 08/30/21 1448 98.9 F (37.2 C)     Temp Source 08/30/21 1448 Oral     SpO2 08/30/21 1448 97 %     Weight --      Height --      Head Circumference --      Peak Flow --      Pain Score 08/30/21 1450 9     Pain Loc --      Pain Edu? --      Excl. in GC? --    No data found.  Updated Vital Signs BP 131/84 (BP Location: Right Arm)   Pulse 73   Temp 98.9 F (37.2 C) (Oral)   Resp 16   SpO2 97%   Visual Acuity Right Eye Distance:   Left Eye Distance:   Bilateral Distance:    Right Eye Near:   Left Eye Near:    Bilateral Near:     Physical Exam Vitals and nursing note reviewed.  Constitutional:      General: She is not in acute distress.    Appearance: Normal appearance.  HENT:     Head: Normocephalic.  Eyes:     Extraocular Movements: Extraocular movements intact.  Cardiovascular:     Rate and Rhythm: Normal rate and regular rhythm.     Pulses: Normal pulses.     Heart sounds: Normal heart sounds.  Pulmonary:     Effort: Pulmonary effort is normal.     Breath sounds: Normal breath sounds.  Abdominal:  General: Bowel sounds are normal.     Palpations: Abdomen is soft.  Musculoskeletal:     Thoracic back: No swelling, edema, spasms, tenderness or bony tenderness. Decreased range of motion.  Skin:    General: Skin is warm and dry.  Neurological:     General: No focal deficit present.     Mental Status: She is alert and oriented to person, place, and time.  Psychiatric:        Mood and Affect: Mood normal.        Behavior: Behavior normal.      UC Treatments / Results  Labs (all labs ordered are listed, but only abnormal results are displayed) Labs Reviewed - No data to  display  EKG   Radiology DG Thoracic Spine 2 View  Result Date: 08/30/2021 CLINICAL DATA:  Thoracic back pain 2 weeks after lifting a 95 pound dog. EXAM: THORACIC SPINE 2 VIEWS COMPARISON:  None Available. FINDINGS: There is no evidence of thoracic spine fracture. Alignment is normal. No other significant bone abnormalities are identified. IMPRESSION: Negative. Electronically Signed   By: Amie Portland M.D.   On: 08/30/2021 15:37    Procedures Procedures (including critical care time)  Medications Ordered in UC Medications - No data to display  Initial Impression / Assessment and Plan / UC Course  I have reviewed the triage vital signs and the nursing notes.  Pertinent labs & imaging results that were available during my care of the patient were reviewed by me and considered in my medical decision making (see chart for details).  Patient presents for complaints of left sided mid back pain after a lifting injury. Symptoms have worsened over the past week.  On exam, patient has no spasm or mid thoracic tenderness.  Her x-rays are negative for fracture or other bone abnormalities.  Symptoms appear to be consistent with a myofascial strain.  Patient was prescribed ibuprofen 800 mg and methocarbamol 500 mg for her symptoms.  Supportive care recommendations were provided to the patient.  Patient advised to follow-up with orthopedics if symptoms do not improve within the next 2 to 4 weeks. Final Clinical Impressions(s) / UC Diagnoses   Final diagnoses:  Thoracic myofascial strain, initial encounter     Discharge Instructions      Your x-rays are negative for fracture or other abnormal findings.  Symptoms appear to be consistent with a myofascial strain. Take medication as prescribed. May apply ice or heat as needed.  Apply ice for pain or swelling, heat for spasm or stiffness.  Apply for 20 minutes, remove for 1 hour, then repeat as needed. Gentle stretching and range of motion exercises  at least 2-3 times daily while symptoms persist. As discussed, if your symptoms fail to improve over the next 2 to 4 weeks, recommend following up with orthopedics.  She can follow-up with [facility] at [phone] or [facility] at [phone].     ED Prescriptions     Medication Sig Dispense Auth. Provider   ibuprofen (ADVIL) 800 MG tablet Take 1 tablet (800 mg total) by mouth every 8 (eight) hours as needed. 30 tablet Sharell Hilmer-Warren, Sadie Haber, NP   methocarbamol (ROBAXIN) 500 MG tablet Take 1 tablet (500 mg total) by mouth 2 (two) times daily. 20 tablet Kito Cuffe-Warren, Sadie Haber, NP      PDMP not reviewed this encounter.   Abran Cantor, NP 08/30/21 1622

## 2021-08-30 NOTE — ED Triage Notes (Signed)
Pt reports right sided middle back pain when lay down, bend over x 1 week. States pain is worse after using a ladder at work today. Pt reports she fell on the ground 3 weeks ago when she was picking up her 95 lbs dog and she hear something pop. Tylenol gives no relief.

## 2021-08-30 NOTE — Discharge Instructions (Addendum)
Your x-rays are negative for fracture or other abnormal findings.  Symptoms appear to be consistent with a myofascial strain. Take medication as prescribed. May apply ice or heat as needed.  Apply ice for pain or swelling, heat for spasm or stiffness.  Apply for 20 minutes, remove for 1 hour, then repeat as needed. Gentle stretching and range of motion exercises at least 2-3 times daily while symptoms persist. As discussed, if your symptoms fail to improve over the next 2 to 4 weeks, recommend following up with orthopedics.  She can follow-up with [facility] at [phone] or [facility] at [phone].

## 2021-09-08 ENCOUNTER — Telehealth (INDEPENDENT_AMBULATORY_CARE_PROVIDER_SITE_OTHER): Payer: Self-pay | Admitting: Psychiatry

## 2021-09-08 ENCOUNTER — Encounter (HOSPITAL_COMMUNITY): Payer: Self-pay | Admitting: Psychiatry

## 2021-09-08 DIAGNOSIS — F411 Generalized anxiety disorder: Secondary | ICD-10-CM

## 2021-09-08 DIAGNOSIS — F900 Attention-deficit hyperactivity disorder, predominantly inattentive type: Secondary | ICD-10-CM

## 2021-09-08 MED ORDER — AMPHETAMINE-DEXTROAMPHETAMINE 20 MG PO TABS: 20.0000 mg | ORAL_TABLET | Freq: Two times a day (BID) | ORAL | 0 refills | Status: DC

## 2021-09-08 MED ORDER — ESCITALOPRAM OXALATE 20 MG PO TABS: 20.0000 mg | ORAL_TABLET | Freq: Every day | ORAL | 2 refills | Status: DC

## 2021-09-08 MED ORDER — AMPHETAMINE-DEXTROAMPHETAMINE 20 MG PO TABS
20.0000 mg | ORAL_TABLET | Freq: Two times a day (BID) | ORAL | 0 refills | Status: DC
Start: 2021-09-08 — End: 2021-12-03

## 2021-09-08 MED ORDER — CLONAZEPAM 1 MG PO TABS: ORAL_TABLET | ORAL | 2 refills | Status: DC

## 2021-09-08 NOTE — Progress Notes (Signed)
Virtual Visit via Video Note  I connected with Kathleen Velasquez on 09/08/21 at  1:00 PM EDT by a video enabled telemedicine application and verified that I am speaking with the correct person using two identifiers.  Location: Patient: home Provider: office   I discussed the limitations of evaluation and management by telemedicine and the availability of in person appointments. The patient expressed understanding and agreed to proceed.      I discussed the assessment and treatment plan with the patient. The patient was provided an opportunity to ask questions and all were answered. The patient agreed with the plan and demonstrated an understanding of the instructions.   The patient was advised to call back or seek an in-person evaluation if the symptoms worsen or if the condition fails to improve as anticipated.  I provided 20 minutes of non-face-to-face time during this encounter.   [name], MD  [facility] MD/PA/NP OP Progress Note  09/08/2021 1:20 PM Kathleen Velasquez  MRN:  [id number]  Chief Complaint:  Chief Complaint  Patient presents with   Depression   Anxiety   ADHD   Follow-up   HPI: This patient is a 31 year old single white female who lives with her niece in [place].  She works as a Occupational hygienist at [employer].  The patient returns after 3 months regarding her anxiety depression and ADHD.  Overall she is doing well.  She hurt her thoracic area probably at work.  She had a negative x-ray but it is still bothering her and she may need to have an MRI.  Other than that her health has been good.  Her mood has been stable and she denies significant depression anxiety thoughts of self-harm or suicidal ideation.  She is sleeping well.  She is focusing well with the Adderall Visit Diagnosis:    ICD-10-CM   1. Generalized anxiety disorder  F41.1     2. Attention deficit hyperactivity disorder (ADHD), predominantly inattentive type  F90.0       Past  Psychiatric History: none  Past Medical History:  Past Medical History:  Diagnosis Date   Abscess    right buttocks/thigh   Anxiety    Depression    DUB (dysfunctional uterine bleeding) 05/25/2012   Hydradenitis    Hypothyroidism    Polycystic ovaries    Thyroid disease    hypothyroidism    Past Surgical History:  Procedure Laterality Date   ADENOIDECTOMY     BALLOON DILATION N/A 12/19/2019   Procedure: BALLOON DILATION;  Surgeon: Lanelle Bal, DO;  [facility];  Service: Endoscopy;  Laterality: N/A;   BIOPSY  12/19/2019   Procedure: BIOPSY;  Surgeon: Lanelle Bal, DO;  [facility];  Service: Endoscopy;;   CHOLECYSTECTOMY  01/27/2012   Procedure: LAPAROSCOPIC CHOLECYSTECTOMY;  Surgeon: Fabio Bering, MD;  Location: [facility];  Service: General;  Laterality: N/A;   ESOPHAGOGASTRODUODENOSCOPY (EGD) WITH PROPOFOL N/A 12/19/2019   Procedure: ESOPHAGOGASTRODUODENOSCOPY (EGD) WITH PROPOFOL;  Surgeon: Lanelle Bal, DO;  [facility];  Service: Endoscopy;  Laterality: N/A;  3:00pm   HERNIA REPAIR Left    LIH   HYDRADENITIS EXCISION  10/02/2011   Procedure: EXCISION HYDRADENITIS AXILLA;  Surgeon: Fabio Bering, MD;  Location: [facility];  Service: General;  Laterality: Right;  Right Axillary Dissection   HYDRADENITIS EXCISION Left 10/19/2013   Procedure: INCISION OF HIDRADENITIS SUPPURATIVA LEFT AXILLA;  Surgeon: Marlane Hatcher, MD;  Location: [facility];  Service:  General;  Laterality: Left;   HYDRADENITIS EXCISION Left 09/09/2018   Procedure: EXCISION HIDRADENITIS AXILLA, LEFT;  Surgeon: Franky Macho, MD;  Location: [facility];  Service: General;  Laterality: Left;   TONSILLECTOMY      Family Psychiatric History: see below  Family History:  Family History  Problem Relation Age of Onset   Diabetes Mother    Hypertension Mother    Hyperlipidemia Mother    Neuropathy Mother    Diabetes Maternal Grandmother    Hypertension Maternal Grandmother     Heart disease Maternal Grandmother        CHF   COPD Maternal Grandmother    Cirrhosis Maternal Grandmother    Anxiety disorder Maternal Grandmother    Cancer Maternal Grandfather        lung   Diabetes Paternal Grandmother    Diabetes Paternal Grandfather    Aneurysm Paternal Grandfather    Anxiety disorder Paternal Grandfather    Drug abuse Father    ADD / ADHD Brother    Stroke Other    Anxiety disorder Maternal Uncle    Anxiety disorder Paternal Uncle    Depression Paternal Uncle    Anxiety disorder Maternal Aunt     Social History:  Social History   Socioeconomic History   Marital status: Single    Spouse name: Not on file   Number of children: Not on file   Years of education: Not on file   Highest education level: Not on file  Occupational History   Not on file  Tobacco Use   Smoking status: Every Day    Packs/day: 0.50    Years: 7.00    Total pack years: 3.50    Types: Cigarettes   Smokeless tobacco: Never  Vaping Use   Vaping Use: Never used  Substance and Sexual Activity   Alcohol use: Yes    Comment: social use    Drug use: No   Sexual activity: Yes    Birth control/protection: None, Implant  Other Topics Concern   Not on file  Social History Narrative   Not on file   Social Determinants of Health   Financial Resource Strain: Not on file  Food Insecurity: Not on file  Transportation Needs: Not on file  Physical Activity: Not on file  Stress: Not on file  Social Connections: Not on file    Allergies:  Allergies  Allergen Reactions   Bactrim Hives and Itching   Ciprofloxacin Hives and Itching    CRNA Discontinued preop antibiotic  IVPB  Cipro in OR after developed itching and hives left arm.  Received Benadryl with relief noted. Dr.Gonzalez and Dr. Leticia Penna informed. DDallasRN   Other Other (See Comments)    [place] causes a hives, swelling, itching, and anaphylaxis.    Penicillins Hives and Itching    Has patient had a PCN reaction  causing immediate rash, facial/tongue/throat swelling, SOB or lightheadedness with hypotension: Yes Has patient had a PCN reaction causing severe rash involving mucus membranes or skin necrosis: No Has patient had a PCN reaction that required hospitalization No Has patient had a PCN reaction occurring within the last 10 years: No If all of the above answers are "NO", then may proceed with Cephalosporin use.    Percocet [Oxycodone-Acetaminophen] Other (See Comments)    Face flushes   Vancomycin Itching    Metabolic Disorder Labs: Lab Results  Component Value Date   HGBA1C 5.7 (H) 05/25/2012   MPG 117 (H) 05/25/2012   No results found  for: "PROLACTIN" No results found for: "CHOL", "TRIG", "HDL", "CHOLHDL", "VLDL", "LDLCALC" Lab Results  Component Value Date   TSH 2.515 05/25/2012    Therapeutic Level Labs: No results found for: "LITHIUM" No results found for: "VALPROATE" No results found for: "CBMZ"  Current Medications: Current Outpatient Medications  Medication Sig Dispense Refill   acetaZOLAMIDE ER (DIAMOX) 500 MG capsule Take 500 mg by mouth every 12 (twelve) hours.     amphetamine-dextroamphetamine (ADDERALL) 20 MG tablet Take 1 tablet (20 mg total) by mouth 2 (two) times daily. 60 tablet 0   amphetamine-dextroamphetamine (ADDERALL) 20 MG tablet Take 1 tablet (20 mg total) by mouth 2 (two) times daily. 60 tablet 0   amphetamine-dextroamphetamine (ADDERALL) 20 MG tablet Take 1 tablet (20 mg total) by mouth 2 (two) times daily. 60 tablet 0   cetirizine (ZYRTEC ALLERGY) 10 MG tablet Take 1 tablet (10 mg total) by mouth daily. (Patient taking differently: Take 10 mg by mouth daily as needed for allergies.) 30 tablet 0   clonazePAM (KLONOPIN) 1 MG tablet TAKE 1 TABLET(1 MG) BY MOUTH TWICE DAILY AS NEEDED FOR ANXIETY 60 tablet 2   doxycycline (VIBRAMYCIN) 100 MG capsule Take 100 mg by mouth 2 (two) times daily.     escitalopram (LEXAPRO) 20 MG tablet Take 1 tablet (20 mg total) by  mouth daily. 90 tablet 2   ibuprofen (ADVIL) 800 MG tablet Take 1 tablet (800 mg total) by mouth every 8 (eight) hours as needed. 30 tablet 0   indomethacin (INDOCIN) 25 MG capsule Take 1 capsule (25 mg total) by mouth as needed (severe headache). 30 capsule 2   meclizine (ANTIVERT) 25 MG tablet Take 1 tablet (25 mg total) by mouth 3 (three) times daily as needed for dizziness. 30 tablet 0   methocarbamol (ROBAXIN) 500 MG tablet Take 1 tablet (500 mg total) by mouth 2 (two) times daily. 20 tablet 0   metoCLOPramide (REGLAN) 10 MG tablet Take 1 tablet (10 mg total) by mouth every 8 (eight) hours as needed for nausea. 8 tablet 0   NUVARING 0.12-0.015 MG/24HR vaginal ring INSERT VAGINALLY AND LEAVE IN. PLACE FOR 3 CONSECUTIVE WEEKS, THEN REMOVE FOR 1 WEEK (Patient taking differently: Place 1 each vaginally See admin instructions. PLACE FOR 3 CONSECUTIVE WEEKS, THEN REMOVE FOR 1 WEEK) 1 each 10   omeprazole (PRILOSEC) 20 MG capsule Take 1 capsule (20 mg total) by mouth 2 (two) times daily. 60 capsule 5   ondansetron (ZOFRAN-ODT) 4 MG disintegrating tablet Take 1 tablet (4 mg total) by mouth every 8 (eight) hours as needed for nausea or vomiting. 20 tablet 0   prochlorperazine (COMPAZINE) 10 MG tablet Take 1 tablet (10 mg total) by mouth 2 (two) times daily as needed for nausea. 20 tablet 0   propranolol (INDERAL) 20 MG tablet Take 1 tablet (20 mg total) by mouth 2 (two) times daily. 60 tablet 2   traMADol (ULTRAM) 50 MG tablet Take 50 mg by mouth every 6 (six) hours.     WEGOVY 0.25 MG/0.5ML SOAJ Inject 0.25 mg into the skin once a week. Friday     No current facility-administered medications for this visit.     Musculoskeletal: Strength & Muscle Tone: na Gait & Station: na Patient leans: N/A  Psychiatric Specialty Exam: Review of Systems  Musculoskeletal:  Positive for back pain.  All other systems reviewed and are negative.   There were no vitals taken for this visit.There is no height or  weight on file to  calculate BMI.  General Appearance: NA  Eye Contact:  NA  Speech:  Clear and Coherent  Volume:  Normal  Mood:  Euthymic  Affect:  NA  Thought Process:  Goal Directed  Orientation:  Full (Time, Place, and Person)  Thought Content: WDL   Suicidal Thoughts:  No  Homicidal Thoughts:  No  Memory:  Immediate;   Good Recent;   Good Remote;   Good  Judgement:  Good  Insight:  Good  Psychomotor Activity:  Normal  Concentration:  Concentration: Good and Attention Span: Good  Recall:  Good  Fund of Knowledge: Good  Language: Good  Akathisia:  No  Handed:  Right  AIMS (if indicated): not done  Assets:  Communication Skills Desire for Improvement Physical Health Resilience Social Support Talents/Skills Vocational/Educational  ADL's:  Intact  Cognition: WNL  Sleep:  Good   Screenings: PHQ2-9    Flowsheet Row [facility] Video Visit from 06/16/2021 in [facility] Video Visit from 03/25/2021 in [facility] Video Visit from 12[facility]24/2020 in [facility] Video Visit from 09/30/2020 in [facility]  PHQ-2 Total Score 0 0 0 0 0      Flowsheet Row Video Visit from 09/08/2021 [facility] [facility] from 08/30/2021 in [facility] from 08/01/2021 in [facility] EMERGENCY DEPARTMENT  C-SSRS RISK CATEGORY No Risk No Risk No Risk        Assessment and Plan: This patient is a 31 year old female with a history of depression anxiety and ADHD.  She continues to do well on her current regimen.  She will continue Lexapro 20 mg daily for depression and anxiety, clonazepam 1 mg twice daily for anxiety and Adderall 20 mg twice daily for ADD.  She will return to see me in 3  months  Collaboration of Care: Collaboration of Care: Primary Care Provider AEB notes will be shared with PCP at patient's request  Patient/Guardian was advised Release of Information must be obtained prior to any record release in order to collaborate their care with an outside provider. Patient/Guardian was advised if they have not already done so to contact the registration department to sign all necessary forms in order for [place] to release information regarding their care.   Consent: Patient/Guardian gives verbal consent for treatment and assignment of benefits for services provided during this visit. Patient/Guardian expressed understanding and agreed to proceed.    [name], MD 09/08/2021, 1:20 PM

## 2021-11-26 DIAGNOSIS — G932 Benign intracranial hypertension: Secondary | ICD-10-CM | POA: Diagnosis not present

## 2021-12-03 ENCOUNTER — Telehealth (INDEPENDENT_AMBULATORY_CARE_PROVIDER_SITE_OTHER): Payer: BC Managed Care – PPO | Admitting: Psychiatry

## 2021-12-03 ENCOUNTER — Encounter (HOSPITAL_COMMUNITY): Payer: Self-pay | Admitting: Psychiatry

## 2021-12-03 DIAGNOSIS — F411 Generalized anxiety disorder: Secondary | ICD-10-CM | POA: Diagnosis not present

## 2021-12-03 DIAGNOSIS — F900 Attention-deficit hyperactivity disorder, predominantly inattentive type: Secondary | ICD-10-CM | POA: Diagnosis not present

## 2021-12-03 MED ORDER — AMPHETAMINE-DEXTROAMPHETAMINE 20 MG PO TABS: 20.0000 mg | ORAL_TABLET | Freq: Two times a day (BID) | ORAL | 0 refills | Status: DC

## 2021-12-03 MED ORDER — CLONAZEPAM 1 MG PO TABS: ORAL_TABLET | ORAL | 2 refills | Status: DC

## 2021-12-03 MED ORDER — ESCITALOPRAM OXALATE 20 MG PO TABS
20.0000 mg | ORAL_TABLET | Freq: Every day | ORAL | 2 refills | Status: DC
Start: 2021-12-03 — End: 2022-05-19

## 2021-12-03 NOTE — Progress Notes (Signed)
Virtual Visit via Video Note  I connected with Kathleen Velasquez on 12/03/21 at  2:40 PM EST by a video enabled telemedicine application and verified that I am speaking with the correct person using two identifiers.  Location: Patient: home Provider: home office   I discussed the limitations of evaluation and management by telemedicine and the availability of in person appointments. The patient expressed understanding     I discussed the assessment and treatment plan with the patient. The patient was provided an opportunity to ask questions and all were answered. The patient agreed with the plan and demonstrated an understanding of the instructions.   The patient was advised to call back or seek an in-person evaluation if the symptoms worsen or if the condition fails to improve as anticipated.  I provided 15 minutes of non-face-to-face time during this encounter.   Diannia Rudereborah Knolan Simien, MD  [facility] MD/PA/NP OP Progress Note  12/03/2021 4:07 PM Kathleen HawthorneBrittany R Velasquez  MRN:  [id number]  Chief Complaint:  Chief Complaint  Patient presents with   Anxiety   Depression   Follow-up   HPI: This patient is a 31 year old single white female who [place].  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  The patient returns for follow-up after 3 months regarding her anxiety depression and ADHD.  She continues to do very well.  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  Her health has been good.  She is sleeping well and denies significant symptoms of depression or anxiety.  She is still focusing well with the Adderall Visit Diagnosis:    ICD-10-CM   1. Generalized anxiety disorder  F41.1     2. Attention deficit hyperactivity disorder (ADHD), predominantly inattentive type  F90.0       Past Psychiatric History: none  Past Medical History:  Past Medical History:  Diagnosis Date   Abscess    right buttocks/thigh   Anxiety     Depression    DUB (dysfunctional uterine bleeding) 05/25/2012   Hydradenitis    Hypothyroidism    Polycystic ovaries    Thyroid disease    hypothyroidism    Past Surgical History:  Procedure Laterality Date   ADENOIDECTOMY     BALLOON DILATION N/A 12/19/2019   Procedure: BALLOON DILATION;  Surgeon: Lanelle Balarver, Charles K, DO;  [facility];  Service: Endoscopy;  Laterality: N/A;   BIOPSY  12/19/2019   Procedure: BIOPSY;  Surgeon: Lanelle Balarver, Charles K, DO;  [facility];  Service: Endoscopy;;   CHOLECYSTECTOMY  01/27/2012   Procedure: LAPAROSCOPIC CHOLECYSTECTOMY;  Surgeon: Fabio BeringBrent C Ziegler, MD;  Location: [facility];  Service: General;  Laterality: N/A;   ESOPHAGOGASTRODUODENOSCOPY (EGD) WITH PROPOFOL N/A 12/19/2019   Procedure: ESOPHAGOGASTRODUODENOSCOPY (EGD) WITH PROPOFOL;  Surgeon: Lanelle Balarver, Charles K, DO;  [facility];  Service: Endoscopy;  Laterality: N/A;  3:00pm   HERNIA REPAIR Left    LIH   HYDRADENITIS EXCISION  10/02/2011   Procedure: EXCISION HYDRADENITIS AXILLA;  Surgeon: Fabio BeringBrent C Ziegler, MD;  Location: [facility];  Service: General;  Laterality: Right;  Right Axillary Dissection   HYDRADENITIS EXCISION Left 10/19/2013   Procedure: INCISION OF HIDRADENITIS SUPPURATIVA LEFT AXILLA;  Surgeon: Marlane HatcherWilliam S Bradford, MD;  Location: [facility];  Service: General;  Laterality: Left;   HYDRADENITIS EXCISION Left 09/09/2018   Procedure: EXCISION HIDRADENITIS AXILLA, LEFT;  Surgeon: Franky MachoJenkins, Mark, MD;  Location: [facility];  Service: General;  Laterality: Left;   TONSILLECTOMY      Family Psychiatric History: See below  Family History:  Family History  Problem Relation Age of Onset   Diabetes Mother    Hypertension Mother    Hyperlipidemia Mother    Neuropathy Mother    Diabetes Maternal Grandmother    Hypertension Maternal Grandmother    Heart disease Maternal Grandmother        CHF   COPD Maternal Grandmother    Cirrhosis Maternal Grandmother    Anxiety disorder  Maternal Grandmother    Cancer Maternal Grandfather        lung   Diabetes Paternal Grandmother    Diabetes Paternal Grandfather    Aneurysm Paternal Grandfather    Anxiety disorder Paternal Grandfather    Drug abuse Father    ADD / ADHD Brother    Stroke Other    Anxiety disorder Maternal Uncle    Anxiety disorder Paternal Uncle    Depression Paternal Uncle    Anxiety disorder Maternal Aunt     Social History:  Social History   Socioeconomic History   Marital status: Single    Spouse name: Not on file   Number of children: Not on file   Years of education: Not on file   Highest education level: Not on file  Occupational History   Not on file  Tobacco Use   Smoking status: Every Day    Packs/day: 0.50    Years: 7.00    Total pack years: 3.50    Types: Cigarettes   Smokeless tobacco: Never  Vaping Use   Vaping Use: Never used  Substance and Sexual Activity   Alcohol use: Yes    Comment: social use    Drug use: No   Sexual activity: Yes    Birth control/protection: None, Implant  Other Topics Concern   Not on file  Social History Narrative   Not on file   Social Determinants of Health   Financial Resource Strain: Not on file  Food Insecurity: Not on file  Transportation Needs: Not on file  Physical Activity: Not on file  Stress: Not on file  Social Connections: Not on file    Allergies:  Allergies  Allergen Reactions   Bactrim Hives and Itching   Ciprofloxacin Hives and Itching    CRNA Discontinued preop antibiotic  IVPB  Cipro in OR after developed itching and hives left arm.  Received Benadryl with relief noted. Dr.Gonzalez and Dr. Leticia Penna informed. DDallasRN   Other Other (See Comments)    [place] causes a hives, swelling, itching, and anaphylaxis.    Penicillins Hives and Itching    Has patient had a PCN reaction causing immediate rash, facial/tongue/throat swelling, SOB or lightheadedness with hypotension: Yes Has patient had a PCN reaction causing  severe rash involving mucus membranes or skin necrosis: No Has patient had a PCN reaction that required hospitalization No Has patient had a PCN reaction occurring within the last 10 years: No If all of the above answers are "NO", then may proceed with Cephalosporin use.    Percocet [Oxycodone-Acetaminophen] Other (See Comments)    Face flushes   Vancomycin Itching    Metabolic Disorder Labs: Lab Results  Component Value Date   HGBA1C 5.7 (H) 05/25/2012   MPG 117 (H) 05/25/2012   No results found for: "PROLACTIN" No results found for: "CHOL", "TRIG", "HDL", "CHOLHDL", "VLDL", "LDLCALC" Lab Results  Component Value Date   TSH 2.515 05/25/2012    Therapeutic Level Labs: No  results found for: "LITHIUM" No results found for: "VALPROATE" No results found for: "CBMZ"  Current Medications: Current Outpatient Medications  Medication Sig Dispense Refill   acetaZOLAMIDE ER (DIAMOX) 500 MG capsule Take 500 mg by mouth every 12 (twelve) hours.     amphetamine-dextroamphetamine (ADDERALL) 20 MG tablet Take 1 tablet (20 mg total) by mouth 2 (two) times daily. 60 tablet 0   amphetamine-dextroamphetamine (ADDERALL) 20 MG tablet Take 1 tablet (20 mg total) by mouth 2 (two) times daily. 60 tablet 0   amphetamine-dextroamphetamine (ADDERALL) 20 MG tablet Take 1 tablet (20 mg total) by mouth 2 (two) times daily. 60 tablet 0   cetirizine (ZYRTEC ALLERGY) 10 MG tablet Take 1 tablet (10 mg total) by mouth daily. (Patient taking differently: Take 10 mg by mouth daily as needed for allergies.) 30 tablet 0   clonazePAM (KLONOPIN) 1 MG tablet TAKE 1 TABLET(1 MG) BY MOUTH TWICE DAILY AS NEEDED FOR ANXIETY 60 tablet 2   doxycycline (VIBRAMYCIN) 100 MG capsule Take 100 mg by mouth 2 (two) times daily.     escitalopram (LEXAPRO) 20 MG tablet Take 1 tablet (20 mg total) by mouth daily. 90 tablet 2   ibuprofen (ADVIL) 800 MG tablet Take 1 tablet (800 mg total) by mouth every 8 (eight) hours as needed. 30  tablet 0   indomethacin (INDOCIN) 25 MG capsule Take 1 capsule (25 mg total) by mouth as needed (severe headache). 30 capsule 2   meclizine (ANTIVERT) 25 MG tablet Take 1 tablet (25 mg total) by mouth 3 (three) times daily as needed for dizziness. 30 tablet 0   methocarbamol (ROBAXIN) 500 MG tablet Take 1 tablet (500 mg total) by mouth 2 (two) times daily. 20 tablet 0   metoCLOPramide (REGLAN) 10 MG tablet Take 1 tablet (10 mg total) by mouth every 8 (eight) hours as needed for nausea. 8 tablet 0   NUVARING 0.12-0.015 MG/24HR vaginal ring INSERT VAGINALLY AND LEAVE IN. PLACE FOR 3 CONSECUTIVE WEEKS, THEN REMOVE FOR 1 WEEK (Patient taking differently: Place 1 each vaginally See admin instructions. PLACE FOR 3 CONSECUTIVE WEEKS, THEN REMOVE FOR 1 WEEK) 1 each 10   omeprazole (PRILOSEC) 20 MG capsule Take 1 capsule (20 mg total) by mouth 2 (two) times daily. 60 capsule 5   ondansetron (ZOFRAN-ODT) 4 MG disintegrating tablet Take 1 tablet (4 mg total) by mouth every 8 (eight) hours as needed for nausea or vomiting. 20 tablet 0   prochlorperazine (COMPAZINE) 10 MG tablet Take 1 tablet (10 mg total) by mouth 2 (two) times daily as needed for nausea. 20 tablet 0   propranolol (INDERAL) 20 MG tablet Take 1 tablet (20 mg total) by mouth 2 (two) times daily. 60 tablet 2   traMADol (ULTRAM) 50 MG tablet Take 50 mg by mouth every 6 (six) hours.     WEGOVY 0.25 MG/0.5ML SOAJ Inject 0.25 mg into the skin once a week. Friday     No current facility-administered medications for this visit.     Musculoskeletal: Strength & Muscle Tone: na Gait & Station: na Patient leans: N/A  Psychiatric Specialty Exam: Review of Systems  All other systems reviewed and are negative.   There were no vitals taken for this visit.There is no height or weight on file to calculate BMI.  General Appearance: na  Eye Contact:  na  Speech:  Clear and Coherent  Volume:  Normal  Mood:  Euthymic  Affect:  Appropriate and Congruent   Thought Process:  Goal Directed  Orientation:  Full (Time, Place, and Person)  Thought Content: WDL   Suicidal Thoughts:  No  Homicidal Thoughts:  No  Memory:  Immediate;   Good Recent;   Good Remote;   Good  Judgement:  Good  Insight:  Fair  Psychomotor Activity:  Normal  Concentration:  Concentration: Good and Attention Span: Good  Recall:  Good  Fund of Knowledge: Good  Language: Good  Akathisia:  No  Handed:  Right  AIMS (if indicated): not done  Assets:  Communication Skills Desire for Improvement Physical Health Resilience Social Support Talents/Skills  ADL's:  Intact  Cognition: WNL  Sleep:  Good   Screenings: PHQ2-9    Flowsheet Row [facility] Video Visit from 06/16/2021 in [facility] Video Visit from 03/25/2021 in [facility] Video Visit from 12/24/2020 [facility] Video Visit from 09/30/2020 [facility]  PHQ-2 Total Score 0 0 0 0 0      Flowsheet Row Video Visit from 09/08/2021 [facility] [facility] from 08/30/2021 in [facility] from 08/01/2021 in [facility]PART[facility]  C-SSRS RISK CATEGORY No Risk No Risk No Risk        Assessment and Plan: This patient is a 31 year old female with a history of depression anxiety and ADHD.  She continues to do well on her current regimen.  She will continue Lexapro 20 mg daily for depression and anxiety, clonazepam 1 mg twice daily for anxiety and Adderall 20 mg twice daily for ADD.  She will return to see me in 3 months  Collaboration of Care: Collaboration of Care: Primary Care Provider AEB notes will be shared with PCP at patient's request  Patient/Guardian was advised Release of Information must be  obtained prior to any record release in order to collaborate their care with an outside provider. Patient/Guardian was advised if they have not already done so to contact the registration department to sign all necessary forms in order for [place] to release information regarding their care.   Consent: Patient/Guardian gives verbal consent for treatment and assignment of benefits for services provided during this visit. Patient/Guardian expressed understanding and agreed to proceed.    [name], MD 12/03/2021, 4:07 PM

## 2021-12-15 DIAGNOSIS — M25552 Pain in left hip: Secondary | ICD-10-CM | POA: Diagnosis not present

## 2021-12-15 DIAGNOSIS — M5451 Vertebrogenic low back pain: Secondary | ICD-10-CM | POA: Diagnosis not present

## 2021-12-25 DIAGNOSIS — R7309 Other abnormal glucose: Secondary | ICD-10-CM | POA: Diagnosis not present

## 2021-12-25 DIAGNOSIS — Z6835 Body mass index (BMI) 35.0-35.9, adult: Secondary | ICD-10-CM | POA: Diagnosis not present

## 2021-12-25 DIAGNOSIS — E063 Autoimmune thyroiditis: Secondary | ICD-10-CM | POA: Diagnosis not present

## 2022-01-13 DIAGNOSIS — M25552 Pain in left hip: Secondary | ICD-10-CM | POA: Diagnosis not present

## 2022-01-13 DIAGNOSIS — M5432 Sciatica, left side: Secondary | ICD-10-CM | POA: Diagnosis not present

## 2022-01-13 DIAGNOSIS — M5451 Vertebrogenic low back pain: Secondary | ICD-10-CM | POA: Diagnosis not present

## 2022-01-13 DIAGNOSIS — M5431 Sciatica, right side: Secondary | ICD-10-CM | POA: Diagnosis not present

## 2022-02-26 ENCOUNTER — Ambulatory Visit
Admission: EM | Admit: 2022-02-26 | Discharge: 2022-02-26 | Disposition: A | Discharge status: Home / Self Care | Payer: BC Managed Care – PPO | Attending: Family Medicine | Admitting: Family Medicine

## 2022-02-26 DIAGNOSIS — J069 Acute upper respiratory infection, unspecified: Secondary | ICD-10-CM

## 2022-02-26 DIAGNOSIS — U071 COVID-19: Secondary | ICD-10-CM | POA: Insufficient documentation

## 2022-02-26 LAB — POCT INFLUENZA A/B
Influenza A, POC: NEGATIVE
Influenza B, POC: NEGATIVE

## 2022-02-26 MED ORDER — PROMETHAZINE-DM 6.25-15 MG/5ML PO SYRP: 5.0000 mL | ORAL_SOLUTION | Freq: Four times a day (QID) | ORAL | 0 refills | Status: DC | PRN

## 2022-02-26 MED ORDER — FLUTICASONE PROPIONATE 50 MCG/ACT NA SUSP: 1.0000 | Freq: Two times a day (BID) | NASAL | 2 refills | Status: DC

## 2022-02-26 NOTE — ED Provider Notes (Signed)
[facility]    CSN: [id number] Arrival date & time: 02/26/22  0906      History   Chief Complaint No chief complaint on file.   HPI Kathleen Velasquez is a 32 y.o. female.   Patient presenting today with 1 day history of low-grade fever, chills, body aches, fatigue, congestion, headaches, ear pain, throat pain, cough.  Denies chest pain, shortness of breath, abdominal pain, nausea vomiting or diarrhea.  So far took some Aleve last night with minimal relief.  Otherwise not tried anything over-the-counter for symptoms.  Multiple sick contacts with COVID recently.    Past Medical History:  Diagnosis Date   Abscess    right buttocks/thigh   Anxiety    Depression    DUB (dysfunctional uterine bleeding) 05/25/2012   Hydradenitis    Hypothyroidism    Polycystic ovaries    Thyroid disease    hypothyroidism    Patient Active Problem List   Diagnosis Date Noted   Left axillary hidradenitis    Anovulatory (dysfunctional uterine) bleeding 06/07/2012   DUB (dysfunctional uterine bleeding) 05/25/2012   FOOT PAIN, CHRONIC 05/09/2007    Past Surgical History:  Procedure Laterality Date   ADENOIDECTOMY     BALLOON DILATION N/A 12/19/2019   Procedure: BALLOON DILATION;  Surgeon: Eloise Harman, DO;  [facility];  Service: Endoscopy;  Laterality: N/A;   BIOPSY  12/19/2019   Procedure: BIOPSY;  Surgeon: Eloise Harman, DO;  [facility];  Service: Endoscopy;;   CHOLECYSTECTOMY  01/27/2012   Procedure: LAPAROSCOPIC CHOLECYSTECTOMY;  Surgeon: Donato Heinz, MD;  Location[facility];  Service: General;  Laterality: N/A;   ESOPHAGOGASTRODUODENOSCOPY (EGD) WITH PROPOFOL N/A 12/19/2019   Procedure: ESOPHAGOGASTRODUODENOSCOPY (EGD) WITH PROPOFOL;  Surgeon: Eloise Harman, DO;  [facility];  Service: Endoscopy;  Laterality: N/A;  3:00pm   HERNIA REPAIR Left    LIH   HYDRADENITIS EXCISION  10/02/2011   Procedure: EXCISION HYDRADENITIS AXILLA;   Surgeon: Donato Heinz, MD;  Location: [facility];  Service: General;  Laterality: Right;  Right Axillary Dissection   HYDRADENITIS EXCISION Left 10/19/2013   Procedure: INCISION OF HIDRADENITIS SUPPURATIVA LEFT AXILLA;  Surgeon: Scherry Ran, MD;  Location: [facility];  Service: General;  Laterality: Left;   HYDRADENITIS EXCISION Left 09/09/2018   Procedure: EXCISION HIDRADENITIS AXILLA, LEFT;  Surgeon: Aviva Signs, MD;  Location: [facility];  Service: General;  Laterality: Left;   TONSILLECTOMY      OB History     Gravida  0   Para      Term      Preterm      AB      Living  0      SAB      IAB      Ectopic      Multiple      Live Births               Home Medications    Prior to Admission medications   Medication Sig Start Date End Date Taking? Authorizing Provider  fluticasone (FLONASE) 50 MCG/ACT nasal spray Place 1 spray into both nostrils 2 (two) times daily. 02/26/22  Yes Volney American, PA-C  promethazine-dextromethorphan (PROMETHAZINE-DM) 6.25-15 MG/5ML syrup Take 5 mLs by mouth 4 (four) times daily as needed. 02/26/22  Yes Volney American, PA-C  acetaZOLAMIDE ER (DIAMOX) 500 MG capsule Take 500 mg by mouth every 12 (twelve) hours. 03/07/21   [provider]  amphetamine-dextroamphetamine (ADDERALL) 20 MG tablet Take 1 tablet (20 mg total) by mouth 2 (two) times daily. 12/03/21 12/03/22  Cloria Spring, MD  amphetamine-dextroamphetamine (ADDERALL) 20 MG tablet Take 1 tablet (20 mg total) by mouth 2 (two) times daily. 12/03/21 12/03/22  Cloria Spring, MD  amphetamine-dextroamphetamine (ADDERALL) 20 MG tablet Take 1 tablet (20 mg total) by mouth 2 (two) times daily. 12/03/21 12/03/22  Cloria Spring, MD  cetirizine (ZYRTEC ALLERGY) 10 MG tablet Take 1 tablet (10 mg total) by mouth daily. Patient taking differently: Take 10 mg by mouth daily as needed for allergies. 08/31/19   Avegno, Darrelyn Hillock, FNP  clonazePAM (KLONOPIN) 1 MG tablet  TAKE 1 TABLET(1 MG) BY MOUTH TWICE DAILY AS NEEDED FOR ANXIETY 12/03/21   Cloria Spring, MD  doxycycline (VIBRAMYCIN) 100 MG capsule Take 100 mg by mouth 2 (two) times daily. 03/06/21   [provider]  escitalopram (LEXAPRO) 20 MG tablet Take 1 tablet (20 mg total) by mouth daily. 12/03/21 12/03/22  Cloria Spring, MD  ibuprofen (ADVIL) 800 MG tablet Take 1 tablet (800 mg total) by mouth every 8 (eight) hours as needed. 08/30/21   Leath-Warren, Alda Lea, NP  indomethacin (INDOCIN) 25 MG capsule Take 1 capsule (25 mg total) by mouth as needed (severe headache). 01/22/21   Genia Harold, MD  meclizine (ANTIVERT) 25 MG tablet Take 1 tablet (25 mg total) by mouth 3 (three) times daily as needed for dizziness. 11/29/20   Isla Pence, MD  methocarbamol (ROBAXIN) 500 MG tablet Take 1 tablet (500 mg total) by mouth 2 (two) times daily. 08/30/21   Leath-Warren, Alda Lea, NP  metoCLOPramide (REGLAN) 10 MG tablet Take 1 tablet (10 mg total) by mouth every 8 (eight) hours as needed for nausea. 04/08/21   Rayna Sexton, PA-C  NUVARING 0.12-0.015 MG/24HR vaginal ring INSERT VAGINALLY AND LEAVE IN. PLACE FOR 3 CONSECUTIVE WEEKS, THEN REMOVE FOR 1 WEEK Patient taking differently: Place 1 each vaginally See admin instructions. PLACE FOR 3 CONSECUTIVE WEEKS, THEN REMOVE FOR 1 WEEK 11/28/15   Constant, Peggy, MD  omeprazole (PRILOSEC) 20 MG capsule Take 1 capsule (20 mg total) by mouth 2 (two) times daily. 12/19/19 04/08/21  Eloise Harman, DO  ondansetron (ZOFRAN-ODT) 4 MG disintegrating tablet Take 1 tablet (4 mg total) by mouth every 8 (eight) hours as needed for nausea or vomiting. 08/01/21   Maudie Flakes, MD  prochlorperazine (COMPAZINE) 10 MG tablet Take 1 tablet (10 mg total) by mouth 2 (two) times daily as needed for nausea. 08/01/21   Maudie Flakes, MD  propranolol (INDERAL) 20 MG tablet Take 1 tablet (20 mg total) by mouth 2 (two) times daily. 01/22/21   Genia Harold, MD  traMADol  (ULTRAM) 50 MG tablet Take 50 mg by mouth every 6 (six) hours. 11/11/20   [provider]  WEGOVY 0.25 MG/0.5ML SOAJ Inject 0.25 mg into the skin once a week. Friday 04/02/21   [provider]    Family History Family History  Problem Relation Age of Onset   Diabetes Mother    Hypertension Mother    Hyperlipidemia Mother    Neuropathy Mother    Diabetes Maternal Grandmother    Hypertension Maternal Grandmother    Heart disease Maternal Grandmother        CHF   COPD Maternal Grandmother    Cirrhosis Maternal Grandmother    Anxiety disorder Maternal Grandmother    Cancer Maternal Grandfather  lung   Diabetes Paternal Grandmother    Diabetes Paternal Grandfather    Aneurysm Paternal Grandfather    Anxiety disorder Paternal Grandfather    Drug abuse Father    ADD / ADHD Brother    Stroke Other    Anxiety disorder Maternal Uncle    Anxiety disorder Paternal Uncle    Depression Paternal Uncle    Anxiety disorder Maternal Aunt     Social History Social History   Tobacco Use   Smoking status: Every Day    Packs/day: 0.50    Years: 7.00    Total pack years: 3.50    Types: Cigarettes   Smokeless tobacco: Never  Vaping Use   Vaping Use: Never used  Substance Use Topics   Alcohol use: Yes    Comment: social use    Drug use: No     Allergies   Bactrim, Ciprofloxacin, Other, Penicillins, Percocet [oxycodone-acetaminophen], and Vancomycin   Review of Systems Review of Systems PER HPI  Physical Exam Triage Vital Signs ED Triage Vitals  Enc Vitals Group     BP 02/26/22 0950 (!) 136/99     Pulse Rate 02/26/22 0950 91     Resp 02/26/22 0950 20     Temp 02/26/22 0950 99.5 F (37.5 C)     Temp Source 02/26/22 0950 Oral     SpO2 02/26/22 0950 97 %     Weight --      Height --      Head Circumference --      Peak Flow --      Pain Score 02/26/22 0953 5     Pain Loc --      Pain Edu? --      Excl. in [place]? --    No data found.  Updated  Vital Signs BP (!) 136/99 (BP Location: Right Arm)   Pulse 91   Temp 99.5 F (37.5 C) (Oral)   Resp 20   LMP  (LMP Unknown) Comment: pt states no period in 4 years due to nuvaring  SpO2 97%   Visual Acuity Right Eye Distance:   Left Eye Distance:   Bilateral Distance:    Right Eye Near:   Left Eye Near:    Bilateral Near:     Physical Exam Vitals and nursing note reviewed.  Constitutional:      Appearance: Normal appearance.  HENT:     Head: Atraumatic.     Right Ear: Tympanic membrane and external ear normal.     Left Ear: Tympanic membrane and external ear normal.     Nose: Rhinorrhea present.     Mouth/Throat:     Mouth: Mucous membranes are moist.     Pharynx: Posterior oropharyngeal erythema present.  Eyes:     Extraocular Movements: Extraocular movements intact.     Conjunctiva/sclera: Conjunctivae normal.  Cardiovascular:     Rate and Rhythm: Normal rate and regular rhythm.     Heart sounds: Normal heart sounds.  Pulmonary:     Effort: Pulmonary effort is normal.     Breath sounds: Normal breath sounds. No wheezing or rales.  Musculoskeletal:        General: Normal range of motion.     Cervical back: Normal range of motion and neck supple.  Skin:    General: Skin is warm and dry.  Neurological:     Mental Status: She is alert and oriented to person, place, and time.  Psychiatric:  Mood and Affect: Mood normal.        Thought Content: Thought content normal.    UC Treatments / Results  Labs (all labs ordered are listed, but only abnormal results are displayed) Labs Reviewed  SARS CORONAVIRUS 2 (TAT 6-24 HRS)  POCT INFLUENZA A/B   EKG  Radiology No results found.  Procedures Procedures (including critical care time)  Medications Ordered in UC Medications - No data to display  Initial Impression / Assessment and Plan / UC Course  I have reviewed the triage vital signs and the nursing notes.  Pertinent labs & imaging results that were  available during my care of the patient were reviewed by me and considered in my medical decision making (see chart for details).     Vitals and exam overall reassuring today and suggestive of a viral respiratory infection.  Rapid flu negative, COVID testing pending.  Treat with Phenergan DM, Flonase, supportive over-the-counter medications and home care.  Work note given.  Return for worsening symptoms.  Final Clinical Impressions(s) / UC Diagnoses   Final diagnoses:  Viral URI with cough   Discharge Instructions   None    ED Prescriptions     Medication Sig Dispense Auth. Provider   promethazine-dextromethorphan (PROMETHAZINE-DM) 6.25-15 MG/5ML syrup Take 5 mLs by mouth 4 (four) times daily as needed. 100 mL Volney American, PA-C   fluticasone [facility]) 50 MCG/ACT nasal spray Place 1 spray into both nostrils 2 (two) times daily. 16 g Volney American, [place]      PDMP not reviewed this encounter.   Volney American, [place] 02/26/22 1047

## 2022-02-26 NOTE — ED Triage Notes (Signed)
Pt reports throat pain, chills, ear pain, headache, body aches cough, fatigue, congestion, throat and nasal drainage x 1 day

## 2022-02-27 LAB — SARS CORONAVIRUS 2 (TAT 6-24 HRS): SARS Coronavirus 2: POSITIVE — AB

## 2022-03-12 ENCOUNTER — Encounter: Payer: Self-pay | Admitting: Radiology

## 2022-05-19 ENCOUNTER — Encounter (HOSPITAL_COMMUNITY): Payer: Self-pay | Admitting: Psychiatry

## 2022-05-19 ENCOUNTER — Telehealth (INDEPENDENT_AMBULATORY_CARE_PROVIDER_SITE_OTHER): Payer: BC Managed Care – PPO | Admitting: Psychiatry

## 2022-05-19 DIAGNOSIS — F900 Attention-deficit hyperactivity disorder, predominantly inattentive type: Secondary | ICD-10-CM

## 2022-05-19 DIAGNOSIS — F411 Generalized anxiety disorder: Secondary | ICD-10-CM | POA: Diagnosis not present

## 2022-05-19 MED ORDER — AMPHETAMINE-DEXTROAMPHETAMINE 20 MG PO TABS: 20.0000 mg | ORAL_TABLET | Freq: Two times a day (BID) | ORAL | 0 refills | Status: DC

## 2022-05-19 MED ORDER — ESCITALOPRAM OXALATE 20 MG PO TABS: 20.0000 mg | ORAL_TABLET | Freq: Every day | ORAL | 2 refills | Status: DC

## 2022-05-19 MED ORDER — CLONAZEPAM 1 MG PO TABS: 1.0000 mg | ORAL_TABLET | Freq: Every day | ORAL | 2 refills | Status: DC | PRN

## 2022-05-19 NOTE — Progress Notes (Signed)
Virtual Visit via Video Note  I connected with Kathleen Velasquez on 05/19/22 at  3:00 PM EDT by a video enabled telemedicine application and verified that I am speaking with the correct person using two identifiers.  Location: Patient: home Provider: office   I discussed the limitations of evaluation and management by telemedicine and the availability of in person appointments. The patient expressed understanding and agreed to proceed.      I discussed the assessment and treatment plan with the patient. The patient was provided an opportunity to ask questions and all were answered. The patient agreed with the plan and demonstrated an understanding of the instructions.   The patient was advised to call back or seek an in-person evaluation if the symptoms worsen or if the condition fails to improve as anticipated.  I provided 15 minutes of non-face-to-face time during this encounter.   [name], MD  [facility] MD/PA/NP OP Progress Note  05/19/2022 3:33 PM Kathleen Velasquez  MRN:  [id number]  Chief Complaint:  Chief Complaint  Patient presents with   Depression   Anxiety   ADD   Follow-up   HPI: This patient is a 32 year old single white female who lives with her niece in [place].  She works as a Occupational hygienist at [employer].  The patient returns for follow-up after about 6 months regarding her anxiety depression and ADHD.  She states she was quite sick for a while with COVID followed by a stomach bug.  She did not take the Adderall during that time and therefore did not run out of medication.  The patient in general is doing well.  She has been using Zepbound for weight loss and has lost about 90 pounds.  She still has some more loose but is obviously made great progress.  Her mood has been good and she denies significant anxiety.  She still enjoys her job.  She is focusing well and sleeping well Visit Diagnosis:    ICD-10-CM   1. Generalized anxiety disorder  F41.1     2.  Attention deficit hyperactivity disorder (ADHD), predominantly inattentive type  F90.0       Past Psychiatric History: none  Past Medical History:  Past Medical History:  Diagnosis Date   Abscess    right buttocks/thigh   Anxiety    Depression    DUB (dysfunctional uterine bleeding) 05/25/2012   Hydradenitis    Hypothyroidism    Polycystic ovaries    Thyroid disease    hypothyroidism    Past Surgical History:  Procedure Laterality Date   ADENOIDECTOMY     BALLOON DILATION N/A 12/19/2019   Procedure: BALLOON DILATION;  Surgeon: Lanelle Bal, DO;  [facility];  Service: Endoscopy;  Laterality: N/A;   BIOPSY  12/19/2019   Procedure: BIOPSY;  Surgeon: Lanelle Bal, DO;  Location: [facility];  Service: Endoscopy;;   CHOLECYSTECTOMY  01/27/2012   Procedure: LAPAROSCOPIC CHOLECYSTECTOMY;  Surgeon: Fabio Bering, MD;  Location: [facility] ORS;  Service: General;  Laterality: N/A;   ESOPHAGOGASTRODUODENOSCOPY (EGD) WITH PROPOFOL N/A 12/19/2019   Procedure: ESOPHAGOGASTRODUODENOSCOPY (EGD) WITH PROPOFOL;  Surgeon: Lanelle Bal, DO;  Location: [facility];  Service: Endoscopy;  Laterality: N/A;  3:00pm   HERNIA REPAIR Left    LIH   HYDRADENITIS EXCISION  10/02/2011   Procedure: EXCISION HYDRADENITIS AXILLA;  Surgeon: Fabio Bering, MD;  Location: [facility] OR[facility];  Service: General;  Laterality: Right;  Right Axillary Dissection   HYDRADENITIS EXCISION  Left 10/19/2013   Procedure: INCISION OF HIDRADENITIS SUPPURATIVA LEFT AXILLA;  Surgeon: Marlane Hatcher, MD;  Location: [facility];  Service: General;  Laterality: Left;   HYDRADENITIS EXCISION Left 09/09/2018   Procedure: EXCISION HIDRADENITIS AXILLA, LEFT;  Surgeon: Franky Macho, MD;  Location: [facility];  Service: General;  Laterality: Left;   TONSILLECTOMY      Family Psychiatric History: See below  Family History:  Family History  Problem Relation Age of Onset   Diabetes Mother    Hypertension Mother    Hyperlipidemia  Mother    Neuropathy Mother    Diabetes Maternal Grandmother    Hypertension Maternal Grandmother    Heart disease Maternal Grandmother        CHF   COPD Maternal Grandmother    Cirrhosis Maternal Grandmother    Anxiety disorder Maternal Grandmother    Cancer Maternal Grandfather        lung   Diabetes Paternal Grandmother    Diabetes Paternal Grandfather    Aneurysm Paternal Grandfather    Anxiety disorder Paternal Grandfather    Drug abuse Father    ADD / ADHD Brother    Stroke Other    Anxiety disorder Maternal Uncle    Anxiety disorder Paternal Uncle    Depression Paternal Uncle    Anxiety disorder Maternal Aunt     Social History:  Social History   Socioeconomic History   Marital status: Single    Spouse name: Not on file   Number of children: Not on file   Years of education: Not on file   Highest education level: Not on file  Occupational History   Not on file  Tobacco Use   Smoking status: Every Day    Packs/day: 0.50    Years: 7.00    Additional pack years: 0.00    Total pack years: 3.50    Types: Cigarettes   Smokeless tobacco: Never  Vaping Use   Vaping Use: Never used  Substance and Sexual Activity   Alcohol use: Yes    Comment: social use    Drug use: No   Sexual activity: Yes    Birth control/protection: None, Implant  Other Topics Concern   Not on file  Social History Narrative   Not on file   Social Determinants of Health   Financial Resource Strain: Not on file  Food Insecurity: Not on file  Transportation Needs: Not on file  Physical Activity: Not on file  Stress: Not on file  Social Connections: Not on file    Allergies:  Allergies  Allergen Reactions   Bactrim Hives and Itching   Ciprofloxacin Hives and Itching    CRNA Discontinued preop antibiotic  IVPB  Cipro in OR after developed itching and hives left arm.  Received Benadryl with relief noted. Dr.Gonzalez and Dr. Leticia Penna informed. DDallasRN   Other Other (See Comments)     [place] causes a hives, swelling, itching, and anaphylaxis.    Penicillins Hives and Itching    Has patient had a PCN reaction causing immediate rash, facial/tongue/throat swelling, SOB or lightheadedness with hypotension: Yes Has patient had a PCN reaction causing severe rash involving mucus membranes or skin necrosis: No Has patient had a PCN reaction that required hospitalization No Has patient had a PCN reaction occurring within the last 10 years: No If all of the above answers are "NO", then may proceed with Cephalosporin use.    Percocet [Oxycodone-Acetaminophen] Other (See Comments)    Face flushes   Vancomycin  Itching    Metabolic Disorder Labs: Lab Results  Component Value Date   HGBA1C 5.7 (H) 05/25/2012   MPG 117 (H) 05/25/2012   No results found for: "PROLACTIN" No results found for: "CHOL", "TRIG", "HDL", "CHOLHDL", "VLDL", "LDLCALC" Lab Results  Component Value Date   TSH 2.515 05/25/2012    Therapeutic Level Labs: No results found for: "LITHIUM" No results found for: "VALPROATE" No results found for: "CBMZ"  Current Medications: Current Outpatient Medications  Medication Sig Dispense Refill   ZEPBOUND 10 MG/0.5ML Pen Inject into the skin.     acetaZOLAMIDE ER (DIAMOX) 500 MG capsule Take 500 mg by mouth every 12 (twelve) hours.     amphetamine-dextroamphetamine (ADDERALL) 20 MG tablet Take 1 tablet (20 mg total) by mouth 2 (two) times daily. 60 tablet 0   amphetamine-dextroamphetamine (ADDERALL) 20 MG tablet Take 1 tablet (20 mg total) by mouth 2 (two) times daily. 60 tablet 0   amphetamine-dextroamphetamine (ADDERALL) 20 MG tablet Take 1 tablet (20 mg total) by mouth 2 (two) times daily. 60 tablet 0   cetirizine (ZYRTEC ALLERGY) 10 MG tablet Take 1 tablet (10 mg total) by mouth daily. (Patient taking differently: Take 10 mg by mouth daily as needed for allergies.) 30 tablet 0   clonazePAM (KLONOPIN) 1 MG tablet Take 1 tablet (1 mg total) by mouth daily as  needed for anxiety. 30 tablet 2   doxycycline (VIBRAMYCIN) 100 MG capsule Take 100 mg by mouth 2 (two) times daily.     escitalopram (LEXAPRO) 20 MG tablet Take 1 tablet (20 mg total) by mouth daily. 90 tablet 2   fluticasone (FLONASE) 50 MCG/ACT nasal spray Place 1 spray into both nostrils 2 (two) times daily. 16 g 2   ibuprofen (ADVIL) 800 MG tablet Take 1 tablet (800 mg total) by mouth every 8 (eight) hours as needed. 30 tablet 0   indomethacin (INDOCIN) 25 MG capsule Take 1 capsule (25 mg total) by mouth as needed (severe headache). 30 capsule 2   meclizine (ANTIVERT) 25 MG tablet Take 1 tablet (25 mg total) by mouth 3 (three) times daily as needed for dizziness. 30 tablet 0   methocarbamol (ROBAXIN) 500 MG tablet Take 1 tablet (500 mg total) by mouth 2 (two) times daily. 20 tablet 0   metoCLOPramide (REGLAN) 10 MG tablet Take 1 tablet (10 mg total) by mouth every 8 (eight) hours as needed for nausea. 8 tablet 0   NUVARING 0.12-0.015 MG/24HR vaginal ring INSERT VAGINALLY AND LEAVE IN. PLACE FOR 3 CONSECUTIVE WEEKS, THEN REMOVE FOR 1 WEEK (Patient taking differently: Place 1 each vaginally See admin instructions. PLACE FOR 3 CONSECUTIVE WEEKS, THEN REMOVE FOR 1 WEEK) 1 each 10   omeprazole (PRILOSEC) 20 MG capsule Take 1 capsule (20 mg total) by mouth 2 (two) times daily. 60 capsule 5   ondansetron (ZOFRAN-ODT) 4 MG disintegrating tablet Take 1 tablet (4 mg total) by mouth every 8 (eight) hours as needed for nausea or vomiting. 20 tablet 0   prochlorperazine (COMPAZINE) 10 MG tablet Take 1 tablet (10 mg total) by mouth 2 (two) times daily as needed for nausea. 20 tablet 0   promethazine-dextromethorphan (PROMETHAZINE-DM) 6.25-15 MG/5ML syrup Take 5 mLs by mouth 4 (four) times daily as needed. 100 mL 0   traMADol (ULTRAM) 50 MG tablet Take 50 mg by mouth every 6 (six) hours.     No current facility-administered medications for this visit.     Musculoskeletal: Strength & Muscle Tone: within  normal limits  Gait & Station: normal Patient leans: N/A  Psychiatric Specialty Exam: Review of Systems  All other systems reviewed and are negative.   There were no vitals taken for this visit.There is no height or weight on file to calculate BMI.  General Appearance: Casual and Fairly Groomed  Eye Contact:  Good  Speech:  Clear and Coherent  Volume:  Normal  Mood:  Euthymic  Affect:  Congruent  Thought Process:  Goal Directed  Orientation:  Full (Time, Place, and Person)  Thought Content: WDL   Suicidal Thoughts:  No  Homicidal Thoughts:  No  Memory:  Immediate;   Good Recent;   Good Remote;   Good  Judgement:  Good  Insight:  Good  Psychomotor Activity:  Normal  Concentration:  Concentration: Good and Attention Span: Good  Recall:  Good  Fund of Knowledge: Good  Language: Good  Akathisia:  No  Handed:  Right  AIMS (if indicated): not done  Assets:  Communication Skills Desire for Improvement Physical Health Resilience Social Support Talents/Skills  ADL's:  Intact  Cognition: WNL  Sleep:  Good   Screenings: PHQ2-9    Flowsheet Row Video Visit from 09/08/2021 in [facility] [place] Video Visit from 06/16/2021 in [facility] at [place] Video Visit from 03/25/2021 in [facility] at [place] Video Visit from 12/24/2020 in [facility] [place] Video Visit from 09/30/2020 in [facility] at [facility] Total Score 0 0 0 0 0      Flowsheet Row ED from 02/26/2022 in [facility] Video Visit from 09/08/2021 in [facility] at [place] ED from 08/30/2021 in [facility] RISK CATEGORY No Risk No Risk No Risk        Assessment and Plan: This patient is a 32 year old female with a history of depression anxiety and ADHD.  She  continues to do well on her current regimen.  She will continue Lexapro 20 mg daily for depression and anxiety, clonazepam 1 mg twice daily for anxiety and Adderall 20 mg twice daily for ADD.  She will return to see me in 3 months  Collaboration of Care: Collaboration of Care: Primary Care Provider AEB notes will be shared with PCP at patient's request  Patient/Guardian was advised Release of Information must be obtained prior to any record release in order to collaborate their care with an outside provider. Patient/Guardian was advised if they have not already done so to contact the registration department to sign all necessary forms in order for [place] to release information regarding their care.   Consent: Patient/Guardian gives verbal consent for treatment and assignment of benefits for services provided during this visit. Patient/Guardian expressed understanding and agreed to proceed.    [name], MD 05/19/2022, 3:33 PM

## 2022-07-14 DIAGNOSIS — M5432 Sciatica, left side: Secondary | ICD-10-CM | POA: Diagnosis not present

## 2022-07-14 DIAGNOSIS — M5416 Radiculopathy, lumbar region: Secondary | ICD-10-CM | POA: Diagnosis not present

## 2022-07-14 DIAGNOSIS — Z0001 Encounter for general adult medical examination with abnormal findings: Secondary | ICD-10-CM | POA: Diagnosis not present

## 2022-07-14 DIAGNOSIS — Z13220 Encounter for screening for lipoid disorders: Secondary | ICD-10-CM | POA: Diagnosis not present

## 2022-07-14 DIAGNOSIS — Z6830 Body mass index (BMI) 30.0-30.9, adult: Secondary | ICD-10-CM | POA: Diagnosis not present

## 2022-07-14 DIAGNOSIS — Z9229 Personal history of other drug therapy: Secondary | ICD-10-CM | POA: Diagnosis not present

## 2022-07-14 DIAGNOSIS — E6609 Other obesity due to excess calories: Secondary | ICD-10-CM | POA: Diagnosis not present

## 2022-08-02 ENCOUNTER — Emergency Department (HOSPITAL_COMMUNITY): Payer: BC Managed Care – PPO

## 2022-08-02 ENCOUNTER — Emergency Department (HOSPITAL_COMMUNITY)
Admission: EM | Admit: 2022-08-02 | Discharge: 2022-08-03 | Disposition: A | Discharge status: Home / Self Care | Payer: BC Managed Care – PPO | Attending: Emergency Medicine | Admitting: Emergency Medicine

## 2022-08-02 ENCOUNTER — Other Ambulatory Visit: Payer: Self-pay

## 2022-08-02 ENCOUNTER — Encounter (HOSPITAL_COMMUNITY): Payer: Self-pay

## 2022-08-02 DIAGNOSIS — R112 Nausea with vomiting, unspecified: Secondary | ICD-10-CM | POA: Insufficient documentation

## 2022-08-02 DIAGNOSIS — R0781 Pleurodynia: Secondary | ICD-10-CM | POA: Diagnosis not present

## 2022-08-02 DIAGNOSIS — E876 Hypokalemia: Secondary | ICD-10-CM | POA: Insufficient documentation

## 2022-08-02 DIAGNOSIS — R0789 Other chest pain: Secondary | ICD-10-CM | POA: Diagnosis not present

## 2022-08-02 DIAGNOSIS — R079 Chest pain, unspecified: Secondary | ICD-10-CM | POA: Diagnosis not present

## 2022-08-02 DIAGNOSIS — R42 Dizziness and giddiness: Secondary | ICD-10-CM | POA: Insufficient documentation

## 2022-08-02 DIAGNOSIS — R091 Pleurisy: Secondary | ICD-10-CM | POA: Diagnosis not present

## 2022-08-02 DIAGNOSIS — F17210 Nicotine dependence, cigarettes, uncomplicated: Secondary | ICD-10-CM | POA: Insufficient documentation

## 2022-08-02 NOTE — ED Provider Notes (Signed)
[place] EMERGENCY DEPARTMENT AT [facility] Provider Note   CSN: [id number] Arrival date & time: 08/02/22  2311     History {Add pertinent medical, surgical, social history, OB history to HPI:1} Chief Complaint  Patient presents with   Chest Pain    Pt stated that she has had chest pain that started Friday and has gotten worse since then. Pt stated that her CP feels stabbing and shooting and when she takes a deep breath, it radiates around her torso and through her shoulder. Pt also complains of nausea and vomitting. Pt stated that her CP is on her left side which feels heavy. Pt stated that she has also been feeling nauseous and dizzy.     CATALEYAH COLBORN is a 32 y.o. female.  The history is provided by the patient.  Chest Pain She has history of polycystic ovari left-sided chest pain for the last 3 days.  Pain is sharp, but with a pressure feeling when she takes a deep breath.  There is associated dyspnea and nausea but no diaphoresis.  She has taken ibuprofen without relief.  She just returned from an 8-day cruise where she drove to [place] and back.  She is a cigarette smoker but denies history of hypertension or hyperlipidemia.  There is a strong family history of premature coronary atherosclerosis.  She has taken ibuprofen without relief.   Home Medications Prior to Admission medications   Medication Sig Start Date End Date Taking? Authorizing Provider  acetaZOLAMIDE ER (DIAMOX) 500 MG capsule Take 500 mg by mouth every 12 (twelve) hours. 03/07/21   [provider]  amphetamine-dextroamphetamine (ADDERALL) 20 MG tablet Take 1 tablet (20 mg total) by mouth 2 (two) times daily. 05/19/22 05/19/23  Myrlene Broker, MD  amphetamine-dextroamphetamine (ADDERALL) 20 MG tablet Take 1 tablet (20 mg total) by mouth 2 (two) times daily. 05/19/22 05/19/23  Myrlene Broker, MD  amphetamine-dextroamphetamine (ADDERALL) 20 MG tablet Take 1 tablet (20 mg total) by mouth 2 (two) times  daily. 05/19/22 05/19/23  Myrlene Broker, MD  cetirizine (ZYRTEC ALLERGY) 10 MG tablet Take 1 tablet (10 mg total) by mouth daily. Patient taking differently: Take 10 mg by mouth daily as needed for allergies. 08/31/19   Avegno, Zachery Dakins, FNP  clonazePAM (KLONOPIN) 1 MG tablet Take 1 tablet (1 mg total) by mouth daily as needed for anxiety. 05/19/22   Myrlene Broker, MD  doxycycline (VIBRAMYCIN) 100 MG capsule Take 100 mg by mouth 2 (two) times daily. 03/06/21   [provider]  escitalopram (LEXAPRO) 20 MG tablet Take 1 tablet (20 mg total) by mouth daily. 05/19/22 05/19/23  Myrlene Broker, MD  fluticasone (FLONASE) 50 MCG/ACT nasal spray Place 1 spray into both nostrils 2 (two) times daily. 02/26/22   Particia Nearing, PA-C  ibuprofen (ADVIL) 800 MG tablet Take 1 tablet (800 mg total) by mouth every 8 (eight) hours as needed. 08/30/21   Leath-Warren, Sadie Haber, NP  indomethacin (INDOCIN) 25 MG capsule Take 1 capsule (25 mg total) by mouth as needed (severe headache). 01/22/21   Ocie Doyne, MD  meclizine (ANTIVERT) 25 MG tablet Take 1 tablet (25 mg total) by mouth 3 (three) times daily as needed for dizziness. 11/29/20   Jacalyn Lefevre, MD  methocarbamol (ROBAXIN) 500 MG tablet Take 1 tablet (500 mg total) by mouth 2 (two) times daily. 08/30/21   Leath-Warren, Sadie Haber, NP  metoCLOPramide (REGLAN) 10 MG tablet Take 1 tablet (10 mg total) by  mouth every 8 (eight) hours as needed for nausea. 04/08/21   Placido Sou, PA-C  NUVARING 0.12-0.015 MG/24HR vaginal ring INSERT VAGINALLY AND LEAVE IN. PLACE FOR 3 CONSECUTIVE WEEKS, THEN REMOVE FOR 1 WEEK Patient taking differently: Place 1 each vaginally See admin instructions. PLACE FOR 3 CONSECUTIVE WEEKS, THEN REMOVE FOR 1 WEEK 11/28/15   Constant, Peggy, MD  omeprazole (PRILOSEC) 20 MG capsule Take 1 capsule (20 mg total) by mouth 2 (two) times daily. 12/19/19 04/08/21  Lanelle Bal, DO  ondansetron (ZOFRAN-ODT) 4 MG disintegrating tablet  Take 1 tablet (4 mg total) by mouth every 8 (eight) hours as needed for nausea or vomiting. 08/01/21   Sabas Sous, MD  prochlorperazine (COMPAZINE) 10 MG tablet Take 1 tablet (10 mg total) by mouth 2 (two) times daily as needed for nausea. 08/01/21   Sabas Sous, MD  promethazine-dextromethorphan (PROMETHAZINE-DM) 6.25-15 MG/5ML syrup Take 5 mLs by mouth 4 (four) times daily as needed. 02/26/22   Particia Nearing, PA-C  traMADol (ULTRAM) 50 MG tablet Take 50 mg by mouth every 6 (six) hours. 11/11/20   [provider]  ZEPBOUND 10 MG/0.5ML Pen Inject into the skin. 03/27/22   [provider]      Allergies    Bactrim, Ciprofloxacin, Other, Penicillins, Percocet [oxycodone-acetaminophen], and Vancomycin    Review of Systems   Review of Systems  Cardiovascular:  Positive for chest pain.  All other systems reviewed and are negative.   Physical Exam Updated Vital Signs BP 133/86 (BP Location: Right Arm)   Pulse 68   Temp 98.4 F (36.9 C) (Oral)   Resp 18   Ht 5\' 5"  (1.651 m)   Wt 80.3 kg   SpO2 100%   BMI 29.45 kg/m  Physical Exam Vitals and nursing note reviewed.   32 year old female, resting comfortably and in no acute distress. Vital signs are normal. Oxygen saturation is 100%, which is normal. Head is normocephalic and atraumatic. PERRLA, EOMI. Oropharynx is clear. Neck is nontender and supple without adenopathy or JVD. Back is nontender and there is no CVA tenderness. Lungs are clear without rales, wheezes, or rhonchi. Chest is nontender. Heart has regular rate and rhythm without murmur. Abdomen is soft, flat, nontendersis or edema, full range of motion is present. Skin is warm and dry without rash. Neurologic: Mental status is normal, cranial nerves are intact, moves all extremities equally.  ED Results / Procedures / Treatments   Labs (all labs ordered are listed, but only abnormal results are displayed) Labs Reviewed  BASIC METABOLIC PANEL   CBC  TROPONIN I (HIGH SENSITIVITY)    EKG ED ECG REPORT   Date: 08/02/2022  Rate: 68  Rhythm: normal sinus rhythm  QRS Axis: normal  Intervals: normal  ST/T Wave abnormalities: early repolarization  Conduction Disutrbances:none  Narrative Interpretation: Normal sinus rhythm, early repolarization.  When compared with ECG 04/08/2021, no significant changes are seen.  Old EKG Reviewed: unchanged  I have personally reviewed the EKG tracing and agree with the computerized printout as noted.  Radiology No results found.  Procedures Procedures  Cardiac monitor shows normal sinus rhythm, per my interpretation.  Medications Ordered in ED Medications - No data to display  ED Course/ Medical Decision Making/ A&P   {   Click here for ABCD2, HEART and other calculatorsREFRESH Note before signing :1}  Medical Decision Making Amount and/or Complexity of Data Reviewed Labs: ordered. Radiology: ordered.   Pleuritic chest pain concerning for pulmonary embolism in the setting of recent long distance car ride.  This is a presentation that carries a high risk of morbidity and complications.  Doubt ACS given duration of symptoms but cannot rule out without troponins.  I have reviewed and interpreted her electrocardiogram and my interpretation is early repolarization but no ischemic changes.  I have ordered laboratory workup of CBC, basic metabolic panel, troponin, D-dimer.  I have ordered chest x-ray.  I have ordered oral aspirin and intravenous morphine.  I have reviewed her past records, and she has no relevant past visits.  Heart score is 3, which puts her at low risk for major adverse cardiac events in the next 6 weeks.  {Document critical care time when appropriate:1} {Document review of labs and clinical decision tools ie heart score, Chads2Vasc2 etc:1}  {Document your independent review of radiology images, and any outside records:1} {Document your discussion  with family members, caretakers, and with consultants:1} {Document social determinants of health affecting pt's care:1} {Document your decision making why or why not admission, treatments were needed:1} Final Clinical Impression(s) / ED Diagnoses Final diagnoses:  None    Rx / DC Orders ED Discharge Orders     None

## 2022-08-03 DIAGNOSIS — R079 Chest pain, unspecified: Secondary | ICD-10-CM | POA: Diagnosis not present

## 2022-08-03 LAB — CBC WITH DIFFERENTIAL/PLATELET
Abs Immature Granulocytes: 0.02 10*3/uL (ref 0.00–0.07)
Basophils Absolute: 0.1 10*3/uL (ref 0.0–0.1)
Basophils Relative: 0 %
Eosinophils Absolute: 0.4 10*3/uL (ref 0.0–0.5)
Eosinophils Relative: 3 %
HCT: 42 % (ref 36.0–46.0)
Hemoglobin: 14.1 g/dL (ref 12.0–15.0)
Immature Granulocytes: 0 %
Lymphocytes Relative: 36 %
Lymphs Abs: 5.3 10*3/uL — ABNORMAL HIGH (ref 0.7–4.0)
MCH: 30.7 pg (ref 26.0–34.0)
MCHC: 33.6 g/dL (ref 30.0–36.0)
MCV: 91.3 fL (ref 80.0–100.0)
Monocytes Absolute: 0.7 10*3/uL (ref 0.1–1.0)
Monocytes Relative: 5 %
Neutro Abs: 8.3 10*3/uL — ABNORMAL HIGH (ref 1.7–7.7)
Neutrophils Relative %: 56 %
Platelets: 290 10*3/uL (ref 150–400)
RBC: 4.6 MIL/uL (ref 3.87–5.11)
RDW: 13 % (ref 11.5–15.5)
WBC: 14.8 10*3/uL — ABNORMAL HIGH (ref 4.0–10.5)
nRBC: 0 % (ref 0.0–0.2)

## 2022-08-03 LAB — BASIC METABOLIC PANEL
Anion gap: 8 (ref 5–15)
BUN: 10 mg/dL (ref 6–20)
CO2: 23 mmol/L (ref 22–32)
Calcium: 8.8 mg/dL — ABNORMAL LOW (ref 8.9–10.3)
Chloride: 104 mmol/L (ref 98–111)
Creatinine, Ser: 0.72 mg/dL (ref 0.44–1.00)
GFR, Estimated: >60 mL/min (ref >60)
Glucose, Bld: 93 mg/dL (ref 70–99)
Potassium: 3.3 mmol/L — ABNORMAL LOW (ref 3.5–5.1)
Sodium: 135 mmol/L (ref 135–145)

## 2022-08-03 LAB — TROPONIN I (HIGH SENSITIVITY)
Troponin I (High Sensitivity): 2 ng/L (ref <18)
Troponin I (High Sensitivity): 3 ng/L (ref <18)

## 2022-08-03 LAB — D-DIMER, QUANTITATIVE: D-Dimer, Quant: 0.27 ug/mL-FEU (ref 0.00–0.50)

## 2022-08-03 MED ORDER — MORPHINE SULFATE (PF) 4 MG/ML IV SOLN
4.0000 mg | Freq: Once | INTRAVENOUS | Status: AC
  Administered 2022-08-03: 4 mg via INTRAVENOUS
  Filled 2022-08-03: qty 1

## 2022-08-03 MED ORDER — POTASSIUM CHLORIDE CRYS ER 20 MEQ PO TBCR
40.0000 meq | EXTENDED_RELEASE_TABLET | Freq: Once | ORAL | Status: AC
  Administered 2022-08-03: 40 meq via ORAL
  Filled 2022-08-03: qty 2

## 2022-08-03 MED ORDER — HYDROCODONE-ACETAMINOPHEN 5-325 MG PO TABS: 1.0000 | ORAL_TABLET | ORAL | 0 refills | Status: DC | PRN

## 2022-08-03 MED ORDER — DEXAMETHASONE 4 MG PO TABS
10.0000 mg | ORAL_TABLET | Freq: Once | ORAL | Status: AC
  Administered 2022-08-03: 10 mg via ORAL
  Filled 2022-08-03: qty 3

## 2022-08-03 MED ORDER — ASPIRIN 81 MG PO CHEW
324.0000 mg | CHEWABLE_TABLET | Freq: Once | ORAL | Status: AC
  Administered 2022-08-03: 324 mg via ORAL
  Filled 2022-08-03: qty 4

## 2022-08-03 NOTE — Discharge Instructions (Signed)
Take ibuprofen and/or acetaminophen as needed for less severe pain.  Return if your symptoms are getting worse.

## 2022-08-04 MED FILL — Hydrocodone-Acetaminophen Tab 5-325 MG: ORAL | Qty: 6 | Status: AC

## 2022-08-08 ENCOUNTER — Ambulatory Visit
Admission: EM | Admit: 2022-08-08 | Discharge: 2022-08-08 | Disposition: A | Discharge status: Home / Self Care | Payer: BC Managed Care – PPO | Attending: Family Medicine | Admitting: Family Medicine

## 2022-08-08 DIAGNOSIS — Z1152 Encounter for screening for COVID-19: Secondary | ICD-10-CM | POA: Insufficient documentation

## 2022-08-08 DIAGNOSIS — R42 Dizziness and giddiness: Secondary | ICD-10-CM | POA: Diagnosis not present

## 2022-08-08 MED ORDER — MECLIZINE HCL 25 MG PO TABS: 25.0000 mg | ORAL_TABLET | Freq: Three times a day (TID) | ORAL | 0 refills | Status: DC | PRN

## 2022-08-08 NOTE — Discharge Instructions (Addendum)
I have sent over some meclizine to see if this helps with your dizziness and given you a handout on how to perform the Epley maneuver's to see if we can recalibrate your equilibrium.  Your labs should be back in the morning and someone should call if things are abnormal or need further discussion.  Go to the emergency department if your symptoms worsen

## 2022-08-08 NOTE — ED Triage Notes (Signed)
Pt reports since her last ED visit her head feels "swimmy", feels dizzy, sharp left side pains (with picking up things), head pressure x 3 days.    States she is super dizzy

## 2022-08-08 NOTE — ED Notes (Signed)
Attempt to obtain blood specimen (2x by RN, 1 by CMA and 1 by Rad tech). Provider made aware we were unsuccessful patient to return tomorrow for attempt.

## 2022-08-09 NOTE — ED Provider Notes (Addendum)
[facility]    CSN: [id number] Arrival date & time: 08/08/22  1535      History   Chief Complaint No chief complaint on file.   HPI Kathleen Velasquez is a 32 y.o. female.   Patient presenting today with 3-day history of feeling dizzy, described as both room spinning and wobbling when she is up and moving.  States when she is sitting still, resting symptoms resolved for the most part.  She states she is still having some sharp left-sided pains with movement and lifting but that overall her pleurisy symptoms that she was seen in the emergency department for 5 days ago have improved with the steroid course given at that time.  She denies mental status changes, severe headache, vision changes, speech or swallowing difficulties, extremity weakness numbness or tingling.  Has not been trying anything over-the-counter for the symptoms.  Of note, just returned from a 9-day cruise several days ago.  EKG and labs were done 5 days ago in the emergency department and without significant abnormality per chart review.  Denies chance of pregnancy.    Past Medical History:  Diagnosis Date   Abscess    right buttocks/thigh   Anxiety    Depression    DUB (dysfunctional uterine bleeding) 05/25/2012   Hydradenitis    Hypothyroidism    Polycystic ovaries    Thyroid disease    hypothyroidism    Patient Active Problem List   Diagnosis Date Noted   Left axillary hidradenitis    Anovulatory (dysfunctional uterine) bleeding 06/07/2012   DUB (dysfunctional uterine bleeding) 05/25/2012   FOOT PAIN, CHRONIC 05/09/2007    Past Surgical History:  Procedure Laterality Date   ADENOIDECTOMY     BALLOON DILATION N/A 12/19/2019   Procedure: BALLOON DILATION;  Surgeon: Lanelle Bal, DO;  Location: [facility];  Service: Endoscopy;  Laterality: N/A;   BIOPSY  12/19/2019   Procedure: BIOPSY;  Surgeon: Lanelle Bal, DO;  Location: [facility];  Service: Endoscopy;;   CHOLECYSTECTOMY   01/27/2012   Procedure: LAPAROSCOPIC CHOLECYSTECTOMY;  Surgeon: Fabio Bering, MD;  Location: [facility];  Service: General;  Laterality: N/A;   ESOPHAGOGASTRODUODENOSCOPY (EGD) WITH PROPOFOL N/A 12/19/2019   Procedure: ESOPHAGOGASTRODUODENOSCOPY (EGD) WITH PROPOFOL;  Surgeon: Lanelle Bal, DO;  Location: [facility];  Service: Endoscopy;  Laterality: N/A;  3:00pm   HERNIA REPAIR Left    LIH   HYDRADENITIS EXCISION  10/02/2011   Procedure: EXCISION HYDRADENITIS AXILLA;  Surgeon: Fabio Bering, MD;  Location: [facility];  Service: General;  Laterality: Right;  Right Axillary Dissection   HYDRADENITIS EXCISION Left 10/19/2013   Procedure: INCISION OF HIDRADENITIS SUPPURATIVA LEFT AXILLA;  Surgeon: Marlane Hatcher, MD;  Location: [facility];  Service: General;  Laterality: Left;   HYDRADENITIS EXCISION Left 09/09/2018   Procedure: EXCISION HIDRADENITIS AXILLA, LEFT;  Surgeon: Franky Macho, MD;  Location: [facility];  Service: General;  Laterality: Left;   TONSILLECTOMY      OB History     Gravida  0   Para      Term      Preterm      AB      Living  0      SAB      IAB      Ectopic      Multiple      Live Births               Home Medications  Prior to Admission medications   Medication Sig Start Date End Date Taking? Authorizing Provider  meclizine (ANTIVERT) 25 MG tablet Take 1 tablet (25 mg total) by mouth 3 (three) times daily as needed for dizziness. 08/08/22  Yes Particia Nearing, PA-C  acetaZOLAMIDE ER (DIAMOX) 500 MG capsule Take 500 mg by mouth every 12 (twelve) hours. 03/07/21   [provider]  amphetamine-dextroamphetamine (ADDERALL) 20 MG tablet Take 1 tablet (20 mg total) by mouth 2 (two) times daily. 05/19/22 05/19/23  Myrlene Broker, MD  amphetamine-dextroamphetamine (ADDERALL) 20 MG tablet Take 1 tablet (20 mg total) by mouth 2 (two) times daily. 05/19/22 05/19/23  Myrlene Broker, MD  amphetamine-dextroamphetamine (ADDERALL) 20 MG tablet Take 1  tablet (20 mg total) by mouth 2 (two) times daily. 05/19/22 05/19/23  Myrlene Broker, MD  cetirizine (ZYRTEC ALLERGY) 10 MG tablet Take 1 tablet (10 mg total) by mouth daily. Patient taking differently: Take 10 mg by mouth daily as needed for allergies. 08/31/19   Avegno, Zachery Dakins, FNP  clonazePAM (KLONOPIN) 1 MG tablet Take 1 tablet (1 mg total) by mouth daily as needed for anxiety. 05/19/22   Myrlene Broker, MD  doxycycline (VIBRAMYCIN) 100 MG capsule Take 100 mg by mouth 2 (two) times daily. 03/06/21   [provider]  escitalopram (LEXAPRO) 20 MG tablet Take 1 tablet (20 mg total) by mouth daily. 05/19/22 05/19/23  Myrlene Broker, MD  fluticasone (FLONASE) 50 MCG/ACT nasal spray Place 1 spray into both nostrils 2 (two) times daily. 02/26/22   Particia Nearing, PA-C  HYDROcodone-acetaminophen (NORCO) 5-325 MG tablet Take 1 tablet by mouth every 4 (four) hours as needed for moderate pain. 08/03/22   Dione Booze, MD  HYDROcodone-acetaminophen (NORCO) 5-325 MG tablet Take 1 tablet by mouth every 4 (four) hours as needed for moderate pain. 08/03/22   Dione Booze, MD  ibuprofen (ADVIL) 800 MG tablet Take 1 tablet (800 mg total) by mouth every 8 (eight) hours as needed. 08/30/21   Leath-Warren, Sadie Haber, NP  indomethacin (INDOCIN) 25 MG capsule Take 1 capsule (25 mg total) by mouth as needed (severe headache). 01/22/21   Ocie Doyne, MD  meclizine (ANTIVERT) 25 MG tablet Take 1 tablet (25 mg total) by mouth 3 (three) times daily as needed for dizziness. 11/29/20   Jacalyn Lefevre, MD  methocarbamol (ROBAXIN) 500 MG tablet Take 1 tablet (500 mg total) by mouth 2 (two) times daily. 08/30/21   Leath-Warren, Sadie Haber, NP  metoCLOPramide (REGLAN) 10 MG tablet Take 1 tablet (10 mg total) by mouth every 8 (eight) hours as needed for nausea. 04/08/21   Placido Sou, PA-C  NUVARING 0.12-0.015 MG/24HR vaginal ring INSERT VAGINALLY AND LEAVE IN. PLACE FOR 3 CONSECUTIVE WEEKS, THEN REMOVE FOR 1  WEEK Patient taking differently: Place 1 each vaginally See admin instructions. PLACE FOR 3 CONSECUTIVE WEEKS, THEN REMOVE FOR 1 WEEK 11/28/15   Constant, Peggy, MD  omeprazole (PRILOSEC) 20 MG capsule Take 1 capsule (20 mg total) by mouth 2 (two) times daily. 12/19/19 04/08/21  Lanelle Bal, DO  ondansetron (ZOFRAN-ODT) 4 MG disintegrating tablet Take 1 tablet (4 mg total) by mouth every 8 (eight) hours as needed for nausea or vomiting. 08/01/21   Sabas Sous, MD  prochlorperazine (COMPAZINE) 10 MG tablet Take 1 tablet (10 mg total) by mouth 2 (two) times daily as needed for nausea. 08/01/21   Sabas Sous, MD  promethazine-dextromethorphan (PROMETHAZINE-DM) 6.25-15 MG/5ML syrup Take 5 mLs by mouth  4 (four) times daily as needed. 02/26/22   Particia Nearing, PA-C  traMADol (ULTRAM) 50 MG tablet Take 50 mg by mouth every 6 (six) hours. 11/11/20   [provider]  ZEPBOUND 10 MG/0.5ML Pen Inject into the skin. 03/27/22   [provider]    Family History Family History  Problem Relation Age of Onset   Diabetes Mother    Hypertension Mother    Hyperlipidemia Mother    Neuropathy Mother    Diabetes Maternal Grandmother    Hypertension Maternal Grandmother    Heart disease Maternal Grandmother        CHF   COPD Maternal Grandmother    Cirrhosis Maternal Grandmother    Anxiety disorder Maternal Grandmother    Cancer Maternal Grandfather        lung   Diabetes Paternal Grandmother    Diabetes Paternal Grandfather    Aneurysm Paternal Grandfather    Anxiety disorder Paternal Grandfather    Drug abuse Father    ADD / ADHD Brother    Stroke Other    Anxiety disorder Maternal Uncle    Anxiety disorder Paternal Uncle    Depression Paternal Uncle    Anxiety disorder Maternal Aunt     Social History Social History   Tobacco Use   Smoking status: Every Day    Current packs/day: 0.50    Average packs/day: 0.5 packs/day for 7.0 years (3.5 ttl pk-yrs)     Types: Cigarettes   Smokeless tobacco: Never  Vaping Use   Vaping status: Never Used  Substance Use Topics   Alcohol use: Yes    Comment: social use    Drug use: No     Allergies   Bactrim, Ciprofloxacin, Other, Penicillins, Percocet [oxycodone-acetaminophen], and Vancomycin   Review of Systems Review of Systems PER HPI  Physical Exam Triage Vital Signs ED Triage Vitals [08/08/22 1547]  Encounter Vitals Group     BP 112/81     Systolic BP Percentile      Diastolic BP Percentile      Pulse Rate 85     Resp 18     Temp 98.7 F (37.1 C)     Temp Source Oral     SpO2 98 %     Weight      Height      Head Circumference      Peak Flow      Pain Score 0     Pain Loc      Pain Education      Exclude from Growth Chart    Orthostatic VS for the past 24 hrs:  BP- Lying Pulse- Lying BP- Sitting Pulse- Sitting BP- Standing at 0 minutes Pulse- Standing at 0 minutes  08/08/22 1548 115/79 91 104/72 86 115/78 100    Updated Vital Signs BP 112/81 (BP Location: Right Arm)   Pulse 85   Temp 98.7 F (37.1 C) (Oral)   Resp 18   SpO2 98%   Visual Acuity Right Eye Distance:   Left Eye Distance:   Bilateral Distance:    Right Eye Near:   Left Eye Near:    Bilateral Near:     Physical Exam Vitals and nursing note reviewed.  Constitutional:      Appearance: Normal appearance. She is not ill-appearing.  HENT:     Head: Atraumatic.     Mouth/Throat:     Mouth: Mucous membranes are moist.  Eyes:     Extraocular Movements: Extraocular movements intact.  Conjunctiva/sclera: Conjunctivae normal.     Pupils: Pupils are equal, round, and reactive to light.  Cardiovascular:     Rate and Rhythm: Normal rate and regular rhythm.     Heart sounds: Normal heart sounds.  Pulmonary:     Effort: Pulmonary effort is normal.     Breath sounds: Normal breath sounds.  Musculoskeletal:        General: Normal range of motion.     Cervical back: Normal range of motion and neck  supple.  Skin:    General: Skin is warm and dry.  Neurological:     General: No focal deficit present.     Mental Status: She is alert and oriented to person, place, and time.     Cranial Nerves: No cranial nerve deficit.     Motor: No weakness.     Gait: Gait normal.  Psychiatric:        Mood and Affect: Mood normal.        Thought Content: Thought content normal.        Judgment: Judgment normal.      UC Treatments / Results  Labs (all labs ordered are listed, but only abnormal results are displayed) Labs Reviewed  SARS CORONAVIRUS 2 (TAT 6-24 HRS)  CBC WITH DIFFERENTIAL/PLATELET  COMPREHENSIVE METABOLIC PANEL    EKG   Radiology No results found.  Procedures Procedures (including critical care time)  Medications Ordered in UC Medications - No data to display  Initial Impression / Assessment and Plan / UC Course  I have reviewed the triage vital signs and the nursing notes.  Pertinent labs & imaging results that were available during my care of the patient were reviewed by me and considered in my medical decision making (see chart for details).     Vital signs benign and reassuring, orthostatics without significant change, COVID testing pending per her request.  She declines EKG today as no current chest pain and EKG was just performed in the emergency department several days ago.  Lab collection attempted without success, discussed to hydrate and return tomorrow for labs.  Exam without any focal deficits.  Suspect a vertigo type, equilibrium related dizziness.  Will treat with meclizine, Epley maneuvers, rest, hydration and go to the emergency department for worsening symptoms.   45 minutes spent today in direct patient care, evaluation, education and attempted lab draw Final Clinical Impressions(s) / UC Diagnoses   Final diagnoses:  Dizziness     Discharge Instructions      I have sent over some meclizine to see if this helps with your dizziness and given  you a handout on how to perform the Epley maneuver's to see if we can recalibrate your equilibrium.  Your labs should be back in the morning and someone should call if things are abnormal or need further discussion.  Go to the emergency department if your symptoms worsen    ED Prescriptions     Medication Sig Dispense Auth. Provider   meclizine (ANTIVERT) 25 MG tablet Take 1 tablet (25 mg total) by mouth 3 (three) times daily as needed for dizziness. 15 tablet Particia Nearing, [place]      PDMP not reviewed this encounter.   Particia Nearing, [place] 08/09/22 1513    Particia Nearing, [place] 08/09/22 1513

## 2022-08-10 LAB — SARS CORONAVIRUS 2 (TAT 6-24 HRS): SARS Coronavirus 2: NEGATIVE

## 2022-08-19 ENCOUNTER — Telehealth (HOSPITAL_COMMUNITY): Payer: BC Managed Care – PPO | Admitting: Psychiatry

## 2022-09-10 ENCOUNTER — Encounter (HOSPITAL_COMMUNITY): Payer: Self-pay | Admitting: Psychiatry

## 2022-09-10 ENCOUNTER — Telehealth (INDEPENDENT_AMBULATORY_CARE_PROVIDER_SITE_OTHER): Payer: BC Managed Care – PPO | Admitting: Psychiatry

## 2022-09-10 DIAGNOSIS — F411 Generalized anxiety disorder: Secondary | ICD-10-CM

## 2022-09-10 DIAGNOSIS — F900 Attention-deficit hyperactivity disorder, predominantly inattentive type: Secondary | ICD-10-CM

## 2022-09-10 MED ORDER — ESCITALOPRAM OXALATE 20 MG PO TABS: 20.0000 mg | ORAL_TABLET | Freq: Every day | ORAL | 2 refills | Status: DC

## 2022-09-10 MED ORDER — CLONAZEPAM 1 MG PO TABS: 1.0000 mg | ORAL_TABLET | Freq: Every day | ORAL | 2 refills | Status: AC | PRN

## 2022-09-10 MED ORDER — AMPHETAMINE-DEXTROAMPHETAMINE 20 MG PO TABS: 20.0000 mg | ORAL_TABLET | Freq: Two times a day (BID) | ORAL | 0 refills | Status: DC

## 2022-09-10 MED ORDER — AMPHETAMINE-DEXTROAMPHETAMINE 20 MG PO TABS: 20.0000 mg | ORAL_TABLET | Freq: Two times a day (BID) | ORAL | 0 refills | Status: AC

## 2022-09-10 NOTE — Progress Notes (Signed)
Virtual Visit via Video Note  I connected with Kathleen Velasquez on 09/10/22 at  3:00 PM EDT by a video enabled telemedicine application and verified that I am speaking with the correct person using two identifiers.  Location: Patient: home Provider: office   I discussed the limitations of evaluation and management by telemedicine and the availability of in person appointments. The patient expressed understanding and agreed to proceed.      I discussed the assessment and treatment plan with the patient. The patient was provided an opportunity to ask questions and all were answered. The patient agreed with the plan and demonstrated an understanding of the instructions.   The patient was advised to call back or seek an in-person evaluation if the symptoms worsen or if the condition fails to improve as anticipated.  I provided 15 minutes of non-face-to-face time during this encounter.   [name], MD  [facility] MD/PA/NP OP Progress Note  09/10/2022 3:20 PM Kathleen Velasquez  MRN:  [id number]  Chief Complaint:  Chief Complaint  Patient presents with   Depression   Anxiety   Follow-up   ADD   HPI: This patient is a 32 year old single white female who lives with her niece in [place].  She works as a Occupational hygienist at [employer].  The patient returns for follow-up after about 4 months regarding her anxiety depression and ADHD.  Overall she is doing well.  She continues to lose weight on the stepdown.  She is now down to 171 pounds and is trying to get down to 150 as her goal weight.  Her mood has been good and she denies significant depression or anxiety.  She is still enjoying her job.  She is focusing well sleeping well and denies any thoughts of self-harm or suicide. Visit Diagnosis:    ICD-10-CM   1. Generalized anxiety disorder  F41.1     2. Attention deficit hyperactivity disorder (ADHD), predominantly inattentive type  F90.0       Past Psychiatric History:  none  Past Medical History:  Past Medical History:  Diagnosis Date   Abscess    right buttocks/thigh   Anxiety    Depression    DUB (dysfunctional uterine bleeding) 05/25/2012   Hydradenitis    Hypothyroidism    Polycystic ovaries    Thyroid disease    hypothyroidism    Past Surgical History:  Procedure Laterality Date   ADENOIDECTOMY     BALLOON DILATION N/A 12/19/2019   Procedure: BALLOON DILATION;  Surgeon: Lanelle Bal, DO;  [facility];  Service: Endoscopy;  Laterality: N/A;   BIOPSY  12/19/2019   Procedure: BIOPSY;  Surgeon: Lanelle Bal, DO;  [facility];  Service: Endoscopy;;   CHOLECYSTECTOMY  01/27/2012   Procedure: LAPAROSCOPIC CHOLECYSTECTOMY;  Surgeon: Fabio Bering, MD;  Location: [facility];  Service: General;  Laterality: N/A;   ESOPHAGOGASTRODUODENOSCOPY (EGD) WITH PROPOFOL N/A 12/19/2019   Procedure: ESOPHAGOGASTRODUODENOSCOPY (EGD) WITH PROPOFOL;  Surgeon: Lanelle Bal, DO;  [facility];  Service: Endoscopy;  Laterality: N/A;  3:00pm   HERNIA REPAIR Left    LIH   HYDRADENITIS EXCISION  10/02/2011   Procedure: EXCISION HYDRADENITIS AXILLA;  Surgeon: Fabio Bering, MD;  Location: [facility];  Service: General;  Laterality: Right;  Right Axillary Dissection   HYDRADENITIS EXCISION Left 10/19/2013   Procedure: INCISION OF HIDRADENITIS SUPPURATIVA LEFT AXILLA;  Surgeon: Marlane Hatcher, MD;  Location: [facility];  Service: General;  Laterality:  Left;   HYDRADENITIS EXCISION Left 09/09/2018   Procedure: EXCISION HIDRADENITIS AXILLA, LEFT;  Surgeon: Franky Macho, MD;  Location: [facility];  Service: General;  Laterality: Left;   TONSILLECTOMY      Family Psychiatric History: See below  Family History:  Family History  Problem Relation Age of Onset   Diabetes Mother    Hypertension Mother    Hyperlipidemia Mother    Neuropathy Mother    Diabetes Maternal Grandmother    Hypertension Maternal Grandmother    Heart disease  Maternal Grandmother        CHF   COPD Maternal Grandmother    Cirrhosis Maternal Grandmother    Anxiety disorder Maternal Grandmother    Cancer Maternal Grandfather        lung   Diabetes Paternal Grandmother    Diabetes Paternal Grandfather    Aneurysm Paternal Grandfather    Anxiety disorder Paternal Grandfather    Drug abuse Father    ADD / ADHD Brother    Stroke Other    Anxiety disorder Maternal Uncle    Anxiety disorder Paternal Uncle    Depression Paternal Uncle    Anxiety disorder Maternal Aunt     Social History:  Social History   Socioeconomic History   Marital status: Single    Spouse name: Not on file   Number of children: Not on file   Years of education: Not on file   Highest education level: Not on file  Occupational History   Not on file  Tobacco Use   Smoking status: Every Day    Current packs/day: 0.50    Average packs/day: 0.5 packs/day for 7.0 years (3.5 ttl pk-yrs)    Types: Cigarettes   Smokeless tobacco: Never  Vaping Use   Vaping status: Never Used  Substance and Sexual Activity   Alcohol use: Yes    Comment: social use    Drug use: No   Sexual activity: Yes    Birth control/protection: None, Implant  Other Topics Concern   Not on file  Social History Narrative   Not on file   Social Determinants of Health   Financial Resource Strain: Not on file  Food Insecurity: Not on file  Transportation Needs: Not on file  Physical Activity: Not on file  Stress: Not on file  Social Connections: Not on file    Allergies:  Allergies  Allergen Reactions   Bactrim Hives and Itching   Ciprofloxacin Hives and Itching    CRNA Discontinued preop antibiotic  IVPB  Cipro in OR after developed itching and hives left arm.  Received Benadryl with relief noted. Dr.Gonzalez and Dr. Leticia Penna informed. DDallasRN   Other Other (See Comments)    [place] causes a hives, swelling, itching, and anaphylaxis.    Penicillins Hives and Itching    Has patient had  a PCN reaction causing immediate rash, facial/tongue/throat swelling, SOB or lightheadedness with hypotension: Yes Has patient had a PCN reaction causing severe rash involving mucus membranes or skin necrosis: No Has patient had a PCN reaction that required hospitalization No Has patient had a PCN reaction occurring within the last 10 years: No If all of the above answers are "NO", then may proceed with Cephalosporin use.    Percocet [Oxycodone-Acetaminophen] Other (See Comments)    Face flushes   Vancomycin Itching    Metabolic Disorder Labs: Lab Results  Component Value Date   HGBA1C 5.7 (H) 05/25/2012   MPG 117 (H) 05/25/2012   No results found for: "  PROLACTIN" No results found for: "CHOL", "TRIG", "HDL", "CHOLHDL", "VLDL", "LDLCALC" Lab Results  Component Value Date   TSH 2.515 05/25/2012    Therapeutic Level Labs: No results found for: "LITHIUM" No results found for: "VALPROATE" No results found for: "CBMZ"  Current Medications: Current Outpatient Medications  Medication Sig Dispense Refill   amphetamine-dextroamphetamine (ADDERALL) 20 MG tablet Take 1 tablet (20 mg total) by mouth 2 (two) times daily. 60 tablet 0   acetaZOLAMIDE ER (DIAMOX) 500 MG capsule Take 500 mg by mouth every 12 (twelve) hours.     amphetamine-dextroamphetamine (ADDERALL) 20 MG tablet Take 1 tablet (20 mg total) by mouth 2 (two) times daily. 60 tablet 0   amphetamine-dextroamphetamine (ADDERALL) 20 MG tablet Take 1 tablet (20 mg total) by mouth 2 (two) times daily. 60 tablet 0   cetirizine (ZYRTEC ALLERGY) 10 MG tablet Take 1 tablet (10 mg total) by mouth daily. (Patient taking differently: Take 10 mg by mouth daily as needed for allergies.) 30 tablet 0   clonazePAM (KLONOPIN) 1 MG tablet Take 1 tablet (1 mg total) by mouth daily as needed for anxiety. 30 tablet 2   doxycycline (VIBRAMYCIN) 100 MG capsule Take 100 mg by mouth 2 (two) times daily.     escitalopram (LEXAPRO) 20 MG tablet Take 1 tablet  (20 mg total) by mouth daily. 90 tablet 2   fluticasone (FLONASE) 50 MCG/ACT nasal spray Place 1 spray into both nostrils 2 (two) times daily. 16 g 2   HYDROcodone-acetaminophen (NORCO) 5-325 MG tablet Take 1 tablet by mouth every 4 (four) hours as needed for moderate pain. 6 tablet 0   HYDROcodone-acetaminophen (NORCO) 5-325 MG tablet Take 1 tablet by mouth every 4 (four) hours as needed for moderate pain. 10 tablet 0   ibuprofen (ADVIL) 800 MG tablet Take 1 tablet (800 mg total) by mouth every 8 (eight) hours as needed. 30 tablet 0   indomethacin (INDOCIN) 25 MG capsule Take 1 capsule (25 mg total) by mouth as needed (severe headache). 30 capsule 2   meclizine (ANTIVERT) 25 MG tablet Take 1 tablet (25 mg total) by mouth 3 (three) times daily as needed for dizziness. 30 tablet 0   meclizine (ANTIVERT) 25 MG tablet Take 1 tablet (25 mg total) by mouth 3 (three) times daily as needed for dizziness. 15 tablet 0   methocarbamol (ROBAXIN) 500 MG tablet Take 1 tablet (500 mg total) by mouth 2 (two) times daily. 20 tablet 0   metoCLOPramide (REGLAN) 10 MG tablet Take 1 tablet (10 mg total) by mouth every 8 (eight) hours as needed for nausea. 8 tablet 0   NUVARING 0.12-0.015 MG/24HR vaginal ring INSERT VAGINALLY AND LEAVE IN. PLACE FOR 3 CONSECUTIVE WEEKS, THEN REMOVE FOR 1 WEEK (Patient taking differently: Place 1 each vaginally See admin instructions. PLACE FOR 3 CONSECUTIVE WEEKS, THEN REMOVE FOR 1 WEEK) 1 each 10   omeprazole (PRILOSEC) 20 MG capsule Take 1 capsule (20 mg total) by mouth 2 (two) times daily. 60 capsule 5   ondansetron (ZOFRAN-ODT) 4 MG disintegrating tablet Take 1 tablet (4 mg total) by mouth every 8 (eight) hours as needed for nausea or vomiting. 20 tablet 0   prochlorperazine (COMPAZINE) 10 MG tablet Take 1 tablet (10 mg total) by mouth 2 (two) times daily as needed for nausea. 20 tablet 0   promethazine-dextromethorphan (PROMETHAZINE-DM) 6.25-15 MG/5ML syrup Take 5 mLs by mouth 4  (four) times daily as needed. 100 mL 0   traMADol (ULTRAM) 50 MG tablet Take  50 mg by mouth every 6 (six) hours.     ZEPBOUND 10 MG/0.5ML Pen Inject into the skin.     No current facility-administered medications for this visit.     Musculoskeletal: Strength & Muscle Tone: within normal limits Gait & Station: normal Patient leans: N/A  Psychiatric Specialty Exam: Review of Systems  All other systems reviewed and are negative.   There were no vitals taken for this visit.There is no height or weight on file to calculate BMI.  General Appearance: Casual and Fairly Groomed  Eye Contact:  Good  Speech:  Clear and Coherent  Volume:  Normal  Mood:  Euthymic  Affect:  Congruent  Thought Process:  Goal Directed  Orientation:  Full (Time, Place, and Person)  Thought Content: WDL   Suicidal Thoughts:  No  Homicidal Thoughts:  No  Memory:  Immediate;   Good Recent;   Good Remote;   Fair  Judgement:  Good  Insight:  Good  Psychomotor Activity:  Normal  Concentration:  Concentration: Good and Attention Span: Good  Recall:  Good  Fund of Knowledge: Good  Language: Good  Akathisia:  No  Handed:  Right  AIMS (if indicated): not done  Assets:  Communication Skills Desire for Improvement Physical Health Resilience Social Support Talents/Skills  ADL's:  Intact  Cognition: WNL  Sleep:  Good   Screenings: PHQ2-9    Flowsheet Row Video Visit from 09/08/2021 in [facility] [place] Video Visit from 06/16/2021 in [facility] at [place] Video Visit from 03/25/2021 in [facility] at [place] Video Visit from 12/24/2020 in [facility] at [place] Video Visit from 09/30/2020 in [facility] at [facility] Total Score 0 0 0 0 0      Flowsheet Row ED from 08/08/2022 in [facility] ED from 08/02/2022 in [facility] at [facility] ED from 02/26/2022 in [facility] at [place]  C-SSRS RISK CATEGORY No Risk No Risk No Risk        Assessment and Plan: This patient is a 32 year old female with a history of depression anxiety and ADHD.  She continues to do well on her current regimen.  She will continue Lexapro 20 mg daily for depression and anxiety, clonazepam 1 mg daily as needed for anxiety and Adderall 20 mg twice daily for ADD.  She will return to see me in 3 months  Collaboration of Care: Collaboration of Care: Primary Care Provider AEB notes will be shared with PCP at patient's request  Patient/Guardian was advised Release of Information must be obtained prior to any record release in order to collaborate their care with an outside provider. Patient/Guardian was advised if they have not already done so to contact the registration department to sign all necessary forms in order for [place] to release information regarding their care.   Consent: Patient/Guardian gives verbal consent for treatment and assignment of benefits for services provided during this visit. Patient/Guardian expressed understanding and agreed to proceed.    [name], MD 09/10/2022, 3:20 PM

## 2022-10-01 DIAGNOSIS — E663 Overweight: Secondary | ICD-10-CM | POA: Diagnosis not present

## 2022-10-01 DIAGNOSIS — G932 Benign intracranial hypertension: Secondary | ICD-10-CM | POA: Diagnosis not present

## 2022-10-01 DIAGNOSIS — Z6828 Body mass index (BMI) 28.0-28.9, adult: Secondary | ICD-10-CM | POA: Diagnosis not present

## 2022-10-01 DIAGNOSIS — Z1331 Encounter for screening for depression: Secondary | ICD-10-CM | POA: Diagnosis not present

## 2022-10-01 DIAGNOSIS — Z0001 Encounter for general adult medical examination with abnormal findings: Secondary | ICD-10-CM | POA: Diagnosis not present

## 2022-11-25 ENCOUNTER — Encounter: Payer: Self-pay | Admitting: Adult Health

## 2022-11-25 ENCOUNTER — Ambulatory Visit: Payer: BC Managed Care – PPO | Admitting: Adult Health

## 2022-11-25 ENCOUNTER — Other Ambulatory Visit (HOSPITAL_COMMUNITY)
Admission: RE | Admit: 2022-11-25 | Discharge: 2022-11-25 | Disposition: A | Discharge status: Home / Self Care | Payer: BC Managed Care – PPO | Source: Ambulatory Visit | Attending: Adult Health | Admitting: Adult Health

## 2022-11-25 VITALS — BP 129/82 | HR 76 | Ht 63.0 in | Wt 164.0 lb

## 2022-11-25 DIAGNOSIS — Z113 Encounter for screening for infections with a predominantly sexual mode of transmission: Secondary | ICD-10-CM | POA: Diagnosis not present

## 2022-11-25 DIAGNOSIS — Z1331 Encounter for screening for depression: Secondary | ICD-10-CM

## 2022-11-25 DIAGNOSIS — Z01419 Encounter for gynecological examination (general) (routine) without abnormal findings: Secondary | ICD-10-CM | POA: Insufficient documentation

## 2022-11-25 DIAGNOSIS — Z3044 Encounter for surveillance of vaginal ring hormonal contraceptive device: Secondary | ICD-10-CM | POA: Insufficient documentation

## 2022-11-25 DIAGNOSIS — F17200 Nicotine dependence, unspecified, uncomplicated: Secondary | ICD-10-CM | POA: Insufficient documentation

## 2022-11-25 MED ORDER — ETONOGESTREL-ETHINYL ESTRADIOL 0.12-0.015 MG/24HR VA RING: VAGINAL_RING | VAGINAL | 6 refills | Status: DC

## 2022-11-25 NOTE — Progress Notes (Signed)
Patient ID: Kathleen Velasquez, female   DOB: 1990-04-24, 32 y.o.   MRN: [id number] History of Present Illness: Kathleen Velasquez is a 32 year old white female,single, G0P0, in for a well woman gyn exam and pap. She needs refill on Nuva ring, too. She has lost over 100 lbs since February 2023, she is on zepbound now.  PCP is Dr Sherwood Gambler.   Current Medications, Allergies, Past Medical History, Past Surgical History, Family History and Social History were reviewed in [employer] electronic medical record.     Review of Systems: Patient denies any headaches, hearing loss, fatigue, blurred vision, shortness of breath, chest pain, abdominal pain, problems with bowel movements, urination, or intercourse(not active). No joint pain or mood swings.  Has bleeding if not on nuva ring, had ?PCO in past.   Physical Exam:BP 129/82 (BP Location: Left Arm, Patient Position: Sitting, Cuff Size: Normal)   Pulse 76   Ht 5\' 3"  (1.6 m)   Wt 164 lb (74.4 kg)   LMP 11/02/2022 (Exact Date)   BMI 29.05 kg/m   General:  Well developed, well nourished, no acute distress Skin:  Warm and dry Neck:  Midline trachea, normal thyroid, good ROM, no lymphadenopathy Lungs; Clear to auscultation bilaterally Breast:  No dominant palpable mass, retraction, or nipple discharge Cardiovascular: Regular rate and rhythm Abdomen:  Soft, non tender, no hepatosplenomegaly Pelvic:  External genitalia is normal in appearance, no lesions.  The vagina is normal in appearance. Urethra has no lesions or masses. The cervix is smooth, pap with GC/CHL and HR HPV genotyping performed, cervix bleed with EC brush.   Uterus is felt to be normal size, shape, and contour.  No adnexal masses or tenderness noted.Bladder is non tender, no masses felt. Extremities/musculoskeletal:  No swelling or varicosities noted, no clubbing or cyanosis Psych:  No mood changes, alert and cooperative,seems happy AA is 0 Fall risk is low    11/25/2022    8:32 AM 09/08/2021     1:11 PM 06/16/2021    2:17 PM  Depression screen PHQ 2/9  Decreased Interest 0    Down, Depressed, Hopeless 0    PHQ - 2 Score 0    Altered sleeping 0    Tired, decreased energy 1    Change in appetite 0    Feeling bad or failure about yourself  0    Trouble concentrating 0    Moving slowly or fidgety/restless 0    Suicidal thoughts 0    PHQ-9 Score 1       Information is confidential and restricted. Go to Review Flowsheets to unlock data.       11/25/2022    8:32 AM  GAD 7 : Generalized Anxiety Score  Nervous, Anxious, on Edge 0  Control/stop worrying 0  Worry too much - different things 0  Trouble relaxing 0  Restless 0  Easily annoyed or irritable 0  Afraid - awful might happen 0  Total GAD 7 Score 0      Upstream - 11/25/22 4098       Pregnancy Intention Screening   Does the patient want to become pregnant in the next year? No    Would the patient like to discuss contraceptive options today? Yes      Contraception Wrap Up   Current Method Vaginal Ring    End Method Vaginal Ring    Contraception Counseling Provided Yes            Examination chaperoned by Freddie Apley RN  Impression and plan: 1. Encounter for routine gynecological examination with Papanicolaou smear of cervix Pap sent Pap in 3 years if normal Physical in 1 year Labs with PCP  - Cytology - PAP( [place])  2. Encounter for surveillance of vaginal ring hormonal contraceptive device Refilled ring for now. Meds ordered this encounter  Medications   etonogestrel-ethinyl estradiol (NUVARING) 0.12-0.015 MG/24HR vaginal ring    Sig: INSERT VAGINALLY AND LEAVE IN. PLACE FOR 3 CONSECUTIVE WEEKS, THEN REMOVE FOR 1 WEEK Strength: 0.12-0.015 MG/24HR    Dispense:  1 each    Refill:  6    Order Specific Question:   Supervising Provider    Answer:   Lazaro Arms [[id number]]   She does not want children and does not want to bleed heavy ever again Will get appt to see Dr Charlotta Newton to discuss  tubal and ablation, (she said a hysterectomy first)  3. Smoker Try to decrease   4. Screening for STD (sexually transmitted disease) GC/CHL on pap - Cytology - PAP( [place])

## 2022-11-27 LAB — CYTOLOGY - PAP
Chlamydia: NEGATIVE
Comment: NEGATIVE
Comment: NEGATIVE
Comment: NORMAL
Diagnosis: NEGATIVE
High risk HPV: NEGATIVE
Neisseria Gonorrhea: NEGATIVE

## 2022-12-16 ENCOUNTER — Ambulatory Visit: Payer: BC Managed Care – PPO | Admitting: Obstetrics & Gynecology

## 2022-12-16 ENCOUNTER — Encounter: Payer: Self-pay | Admitting: Obstetrics & Gynecology

## 2022-12-16 VITALS — BP 123/75 | HR 73 | Ht 63.0 in | Wt 161.0 lb

## 2022-12-16 DIAGNOSIS — F17200 Nicotine dependence, unspecified, uncomplicated: Secondary | ICD-10-CM

## 2022-12-16 DIAGNOSIS — N939 Abnormal uterine and vaginal bleeding, unspecified: Secondary | ICD-10-CM | POA: Diagnosis not present

## 2022-12-16 NOTE — Progress Notes (Signed)
GYN VISIT Patient name: KERTINA MEHRER MRN [id number]  Date of birth: December 08, 1990 Chief Complaint:   Pre-op Exam (Discuss surgical options)  History of Present Illness:   DYNESHIA MITRO is a 32 y.o. G0P0 female being seen today for the following concerns:  On nuvaring for over a decade and menses will be regular. Without this device will have continuous bleeding that in the past lasted 9mos.   With nuvaring menses last for about 5-7 days.  Once she is 32yo she will no longer be able to use Nuvaring due to tobacco use.  She does not ever desire a pregnancy.  Previously worked up by Dr. Emelda Fear over a decade ago and noted to have anovulatory irregular bleeding- symptoms would resolve on Clomid.  Denies regular discharge, itching, irritation.  Denies pelvic or abdominal pain.  She presents today to discuss more permanent options as she will need to discontinue the NuvaRing when she is 34 due to tobacco use.  She is concerned about weight gain and compliance with the pill.  At this time she never wants a pregnancy and is most interested in the endometrial ablation  Patient's last menstrual period was 11/02/2022 (exact date).    Review of Systems:   Pertinent items are noted in HPI Denies fever/chills, dizziness, headaches, visual disturbances, fatigue, shortness of breath, chest pain, abdominal pain, vomiting, no problems with bowel movements, urination, or intercourse unless otherwise stated above.  Pertinent History Reviewed:   Past Surgical History:  Procedure Laterality Date   ADENOIDECTOMY     BALLOON DILATION N/A 12/19/2019   Procedure: BALLOON DILATION;  Surgeon: Lanelle Bal, DO;  [facility];  Service: Endoscopy;  Laterality: N/A;   BIOPSY  12/19/2019   Procedure: BIOPSY;  Surgeon: Lanelle Bal, DO;  [facility];  Service: Endoscopy;;   CHOLECYSTECTOMY  01/27/2012   Procedure: LAPAROSCOPIC CHOLECYSTECTOMY;  Surgeon: Fabio Bering, MD;   Location: [facility];  Service: General;  Laterality: N/A;   ESOPHAGOGASTRODUODENOSCOPY (EGD) WITH PROPOFOL N/A 12/19/2019   Procedure: ESOPHAGOGASTRODUODENOSCOPY (EGD) WITH PROPOFOL;  Surgeon: Lanelle Bal, DO;  [facility];  Service: Endoscopy;  Laterality: N/A;  3:00pm   HERNIA REPAIR Left    LIH   HYDRADENITIS EXCISION  10/02/2011   Procedure: EXCISION HYDRADENITIS AXILLA;  Surgeon: Fabio Bering, MD;  Location: [facility];  Service: General;  Laterality: Right;  Right Axillary Dissection   HYDRADENITIS EXCISION Left 10/19/2013   Procedure: INCISION OF HIDRADENITIS SUPPURATIVA LEFT AXILLA;  Surgeon: Marlane Hatcher, MD;  Location: [facility];  Service: General;  Laterality: Left;   HYDRADENITIS EXCISION Left 09/09/2018   Procedure: EXCISION HIDRADENITIS AXILLA, LEFT;  Surgeon: Franky Macho, MD;  Location: [facility];  Service: General;  Laterality: Left;   TONSILLECTOMY      Past Medical History:  Diagnosis Date   Abscess    right buttocks/thigh   Anxiety    Depression    DUB (dysfunctional uterine bleeding) 05/25/2012   Hydradenitis    Hypothyroidism    Polycystic ovaries    Thyroid disease    hypothyroidism   Reviewed problem list, medications and allergies. Physical Assessment:   Vitals:   12/16/22 1153  BP: 123/75  Pulse: 73  Weight: 161 lb (73 kg)  Height: 5\' 3"  (1.6 m)  Body mass index is 28.52 kg/m.       Physical Examination:   General appearance: alert, well appearing, and in no distress  Psych: mood appropriate,  normal affect  Skin: warm & dry   Cardiovascular: normal heart rate noted  Respiratory: normal respiratory effort, no distress  Abdomen: soft, non-tender   Pelvic: examination not indicated  Extremities: no edema   Chaperone: N/A    Assessment & Plan:  1) AUB -reviewed all options medical management including pills or IUD -Discussed endometrial ablation reviewed risk benefits including but not limited to risk of cramping, failure of  procedure and that procedure is not considered contraception -Reviewed hysterectomy discussed risk benefits including but not limited to risk of bleeding, infection and injury to surrounding organs or other potential surgical complications such as PE or DVT -Also discussed potential recovery of each option -After much discussion patient is leading towards endometrial ablation -Plan for pelvic ultrasound to rule out underlying etiology  Should ultrasound revealed no acute abnormalities will then plan to proceed with endometrial ablation []  surgical referral created []  will need to discontinue Zepbound one week prior to surgery  Orders Placed This Encounter  Procedures   US PELVIC COMPLETE WITH TRANSVAGINAL    Return for pelvic US then visit with me after for EMB (next available in office).   Myna Hidalgo, DO Attending Obstetrician & Gynecologist, [facility] for [employer], [facility]

## 2022-12-28 ENCOUNTER — Encounter: Payer: Self-pay | Admitting: Obstetrics & Gynecology

## 2023-01-27 ENCOUNTER — Ambulatory Visit: Payer: BC Managed Care – PPO | Admitting: Obstetrics & Gynecology

## 2023-01-27 ENCOUNTER — Encounter: Payer: Self-pay | Admitting: Obstetrics & Gynecology

## 2023-01-27 ENCOUNTER — Ambulatory Visit: Payer: BC Managed Care – PPO | Admitting: Radiology

## 2023-01-27 VITALS — BP 128/82 | HR 78 | Ht 64.0 in | Wt 160.4 lb

## 2023-01-27 DIAGNOSIS — N921 Excessive and frequent menstruation with irregular cycle: Secondary | ICD-10-CM | POA: Diagnosis not present

## 2023-01-27 DIAGNOSIS — N939 Abnormal uterine and vaginal bleeding, unspecified: Secondary | ICD-10-CM

## 2023-01-27 DIAGNOSIS — Z01818 Encounter for other preprocedural examination: Secondary | ICD-10-CM

## 2023-01-27 DIAGNOSIS — F17200 Nicotine dependence, unspecified, uncomplicated: Secondary | ICD-10-CM | POA: Diagnosis not present

## 2023-01-27 NOTE — Progress Notes (Signed)
 US :  TA and TV images obtained - Chaperone:  Emma -  vinyl probe cover used  Anteverted uterus normal in size,  EEC symmetrical and homogenous = 5.6 mm in thickness, Homogeneous myometrium, no focal mass seen no evidence of intracavitary defects,  avascular cavity and canal. Normal ovaries,  Rt ov appears mobile,  more difficult to tell about Left ov mobility due to Left ovary positioned higher and more lateral in the pelvis Neg adnexal regions - no evidence of pelvic mass or fluid collections Neg CDS  -  no free fluid present

## 2023-01-27 NOTE — Progress Notes (Signed)
 GYN VISIT Patient name: Kathleen Velasquez MRN [id number]  Date of birth: 23-Sep-1990 Chief Complaint:   Follow-up  History of Present Illness:   Kathleen Velasquez is a 33 y.o. G0P0 female being seen today for preop exam/HMB.  In review, this has been an ongoing issue for many years.  While her menses have been stable with NuvaRing, due to her tobacco use she is aware that this is contraindicated when she turns 34.  Without this device will have continuous bleeding that in the past lasted 9mos.   With nuvaring menses last for about 5-7 days.    US  was completed today to r/o underlying etiology.  She otherwise reports no acute complaints or changes since her last visit.  No LMP recorded. (Menstrual status: Oral contraceptives).   Review of Systems:   Pertinent items are noted in HPI Denies fever/chills, dizziness, headaches, visual disturbances, fatigue, shortness of breath, chest pain, abdominal pain, vomiting, no problems with bowel movements, urination, or intercourse unless otherwise stated above.  Pertinent History Reviewed:   Past Surgical History:  Procedure Laterality Date   ADENOIDECTOMY     BALLOON DILATION N/A 12/19/2019   Procedure: BALLOON DILATION;  Surgeon: Vinetta Greening, DO;  [facility];  Service: Endoscopy;  Laterality: N/A;   BIOPSY  12/19/2019   Procedure: BIOPSY;  Surgeon: Vinetta Greening, DO;  [facility];  Service: Endoscopy;;   CHOLECYSTECTOMY  01/27/2012   Procedure: LAPAROSCOPIC CHOLECYSTECTOMY;  Surgeon: Lovena Rubinstein, MD;  Location: [facility];  Service: General;  Laterality: N/A;   ESOPHAGOGASTRODUODENOSCOPY (EGD) WITH PROPOFOL  N/A 12/19/2019   Procedure: ESOPHAGOGASTRODUODENOSCOPY (EGD) WITH PROPOFOL ;  Surgeon: Vinetta Greening, DO;  [facility];  Service: Endoscopy;  Laterality: N/A;  3:00pm   HERNIA REPAIR Left    LIH   HYDRADENITIS EXCISION  10/02/2011   Procedure: EXCISION HYDRADENITIS AXILLA;  Surgeon: Lovena Rubinstein, MD;  Location: [facility];  Service: General;  Laterality: Right;  Right Axillary Dissection   HYDRADENITIS EXCISION Left 10/19/2013   Procedure: INCISION OF HIDRADENITIS SUPPURATIVA LEFT AXILLA;  Surgeon: Myrl Askew, MD;  Location: [facility];  Service: General;  Laterality: Left;   HYDRADENITIS EXCISION Left 09/09/2018   Procedure: EXCISION HIDRADENITIS AXILLA, LEFT;  Surgeon: Alanda Allegra, MD;  Location: [facility];  Service: General;  Laterality: Left;   TONSILLECTOMY      Past Medical History:  Diagnosis Date   Abscess    right buttocks/thigh   Anxiety    Depression    DUB (dysfunctional uterine bleeding) 05/25/2012   Hydradenitis    Hypothyroidism    Polycystic ovaries    Thyroid  disease    hypothyroidism   Reviewed problem list, medications and allergies. Physical Assessment:   Vitals:   01/27/23 1000  BP: 128/82  Pulse: 78  Weight: 160 lb 6.4 oz (72.8 kg)  Height: 5\' 4"  (1.626 m)  Body mass index is 27.53 kg/m.       Physical Examination:   General appearance: alert, well appearing, and in no distress  Psych: mood appropriate, normal affect  Skin: warm & dry   Cardiovascular: normal heart rate noted  Respiratory: normal respiratory effort, no distress  Pelvic: examination not indicated  Extremities: no edema   Chaperone: N/A    US  findings: Anteverted uterus normal in size,  EEC symmetrical and homogenous = 5.6 mm in thickness, Homogeneous myometrium, no focal mass seen no evidence of intracavitary defects,  avascular cavity and canal.  Normal ovaries,  Rt ov appears mobile,  more difficult to tell about Left ov mobility due to Left ovary positioned higher and more lateral in the pelvis Neg adnexal regions - no evidence of pelvic mass or fluid collections Neg CDS  -  no free fluid present  Assessment & Plan:  1) HMB/AUB -reviewed today's US , no acute abnormalities noted -reviewed plan for hysteroscopy, D&C, hydrothermal ablation -Discussed benefit and  risk including but not limited to risk of bleeding, infection or injury such as uterine perforation.  Discussed that due to small size of uterus showed perforation occur laparoscopy may be required.  Also discussed 10% failure of ablation -Patient is aware that procedure is not contraception -Questions and concerns were addressed and patient desires to proceed -Patient is aware stop Zepbound prior to procedure  -scheduled for 1/29, preop labs ordered   No orders of the defined types were placed in this encounter.   Return for as scheduled.   Kainen Struckman, DO Attending Obstetrician & Gynecologist, [facility] for [employer], [facility]

## 2023-02-04 NOTE — Patient Instructions (Addendum)
Kathleen Velasquez  02/04/2023     @PREFPERIOPPHARMACY @   Your procedure is scheduled on  02/10/2023.   Report to [name] at  0940  A.M.   Call this number if you have problems the morning of surgery:  [phone]  If you experience any cold or flu symptoms such as cough, fever, chills, shortness of breath, etc. between now and your scheduled surgery, please notify us at the above number.   Remember:  Do not eat after midnight.   You may drink clear liquids until  0740 on 02/10/2023.    Clear liquids allowed are:                    Water, Juice (No red color; non-citric and without pulp; diabetics please choose diet or no sugar options), Carbonated beverages (diabetics please choose diet or no sugar options), Clear Tea (No creamer, milk, or cream, including half & half and powdered creamer), Black Coffee Only (No creamer, milk or cream, including half & half and powdered creamer), and Clear Sports drink (No red color; diabetics please choose diet or no sugar options)    Take these medicines the morning of surgery with A SIP OF WATER                                         clonazepam.    Do not wear jewelry, make-up or nail polish, including gel polish,  artificial nails, or any other type of covering on natural nails (fingers and  toes).  Do not wear lotions, powders, or perfumes, or deodorant.  Do not shave 48 hours prior to surgery.  Men may shave face and neck.  Do not bring valuables to the hospital.  [facility] is not responsible for any belongings or valuables.  Contacts, dentures or bridgework may not be worn into surgery.  Leave your suitcase in the car.  After surgery it may be brought to your room.  For patients admitted to the hospital, discharge time will be determined by your treatment team.  Patients discharged the day of surgery will not be allowed to drive home and must have someone with them for 24 hours.    Special instructions:   DO NOT smoke  tobacco or vape for 24 hours before your procedure.  Please read over the following fact sheets that you were given. Coughing and Deep Breathing, Surgical Site Infection Prevention, Anesthesia Post-op Instructions, and Care and Recovery After Surgery         Dilation and Curettage or Vacuum Curettage, Care After The following information offers guidance on how to care for yourself after your procedure. Your doctor may also give you more specific instructions. If you have problems or questions, contact your doctor. What can I expect after the procedure? After the procedure, it is common to have: Mild pain or cramps. Some bleeding or spotting from the vagina. These may last for up to 2 weeks. Follow these instructions at home: Medicines Take over-the-counter and prescription medicines only as told by your doctor. If told, take steps to prevent problems with pooping (constipation). You may need to: Drink enough fluid to keep your pee (urine) pale yellow. Take medicines. You will be told what medicines to take. Eat foods that are high in fiber. These include beans, whole grains, and fresh fruits and vegetables. Limit foods that  are high in fat and sugar. These include fried or sweet foods. Ask your doctor if you should avoid driving or using machines while you are taking your medicine. Activity  If you were given a medicine to help you relax (sedative) during your procedure, it can affect you for many hours. Do not drive or use machinery until your doctor says that it is safe. Rest as told by your doctor. Get up to take short walks every 1-2 hours. Ask for help if you feel weak or unsteady. Do not lift anything that is heavier than 10 lb (4.5 kg), or the limit that you are told. Return to your normal activities when your doctor says that it is safe. Lifestyle For at least 2 weeks, or as long as told by your doctor: Do not douche. Do not use tampons. Do not have sex. General  instructions Do not take baths, swim, or use a hot tub. Ask your doctor if you may take showers. Do not smoke or use any products that contain nicotine or tobacco. These can delay healing. If you need help quitting, ask your doctor. Wear compression stockings as told by your doctor. It is up to you to get the results of your procedure. Ask how to get your results when they are ready. Keep all follow-up visits. Contact a doctor if: You have very bad cramps that get worse or do not get better with medicine. You have very bad pain in your belly (abdomen). You cannot drink fluids without vomiting. You have pain in the area just above your thighs. You have fluid from your vagina that smells bad. You have a rash. Get help right away if: You are bleeding a lot from your vagina. This means soaking more than one sanitary pad in 1 hour, and this happens for 2 hours in a row. You have a fever that is above 100.52F (38C). Your belly feels very tender or hard. You have chest pain. You have trouble breathing. You feel dizzy or light-headed. You faint. You have pain in your neck or shoulder area. These symptoms may be an emergency. Get help right away. Call your local emergency services (911 in the [place] Do not wait to see if the symptoms will go away. Do not drive yourself to the hospital. Summary After your procedure, it is common to have pain or cramping. It is also common to have bleeding or spotting from your vagina. Rest as told. Get up to take short walks every 1-2 hours. Do not lift anything that is heavier than 10 lb (4.5 kg), or the limit that you are told. Get help right away if you have problems from the procedure. Ask your doctor what problems to watch for. This information is not intended to replace advice given to you by your health care provider. Make sure you discuss any questions you have with your health care provider. Document Revised: 12/18/2019 Document Reviewed:  12/20/2019 Elsevier Patient Education  2024 [employer]General Anesthesia, Adult, Care After The following information offers guidance on how to care for yourself after your procedure. Your health care provider may also give you more specific instructions. If you have problems or questions, contact your health care provider. What can I expect after the procedure? After the procedure, it is common for people to: Have pain or discomfort at the IV site. Have nausea or vomiting. Have a sore throat or hoarseness. Have trouble concentrating. Feel cold or chills. Feel weak, sleepy, or tired (fatigue). Have soreness and  body aches. These can affect parts of the body that were not involved in surgery. Follow these instructions at home: For the time period you were told by your health care provider:  Rest. Do not participate in activities where you could fall or become injured. Do not drive or use machinery. Do not drink alcohol. Do not take sleeping pills or medicines that cause drowsiness. Do not make important decisions or sign legal documents. Do not take care of children on your own. General instructions Drink enough fluid to keep your urine pale yellow. If you have sleep apnea, surgery and certain medicines can increase your risk for breathing problems. Follow instructions from your health care provider about wearing your sleep device: Anytime you are sleeping, including during daytime naps. While taking prescription pain medicines, sleeping medicines, or medicines that make you drowsy. Return to your normal activities as told by your health care provider. Ask your health care provider what activities are safe for you. Take over-the-counter and prescription medicines only as told by your health care provider. Do not use any products that contain nicotine or tobacco. These products include cigarettes, chewing tobacco, and vaping devices, such as e-cigarettes. These can delay incision  healing after surgery. If you need help quitting, ask your health care provider. Contact a health care provider if: You have nausea or vomiting that does not get better with medicine. You vomit every time you eat or drink. You have pain that does not get better with medicine. You cannot urinate or have bloody urine. You develop a skin rash. You have a fever. Get help right away if: You have trouble breathing. You have chest pain. You vomit blood. These symptoms may be an emergency. Get help right away. Call 911. Do not wait to see if the symptoms will go away. Do not drive yourself to the hospital. Summary After the procedure, it is common to have a sore throat, hoarseness, nausea, vomiting, or to feel weak, sleepy, or fatigue. For the time period you were told by your health care provider, do not drive or use machinery. Get help right away if you have difficulty breathing, have chest pain, or vomit blood. These symptoms may be an emergency. This information is not intended to replace advice given to you by your health care provider. Make sure you discuss any questions you have with your health care provider. Document Revised: 03/28/2021 Document Reviewed: 03/28/2021 Elsevier Patient Education  2024 Else[employer].How to Use Chlorhexidine at Home in the Shower Chlorhexidine gluconate (CHG) is a germ-killing (antiseptic) wash that's used to clean the skin. It can get rid of the germs that normally live on the skin and can keep them away for about 24 hours. If you're having surgery, you may be told to shower with CHG at home the night before surgery. This can help lower your risk for infection. To use CHG wash in the shower, follow the steps below. Supplies needed: CHG body wash. Clean washcloth. Clean towel. How to use CHG in the shower Follow these steps unless you're told to use CHG in a different way: Start the shower. Use your normal soap and shampoo to wash your face and  hair. Turn off the shower or move out of the shower stream. Pour CHG onto a clean washcloth. Do not use any type of brush or rough sponge. Start at your neck, washing your body down to your toes. Make sure you: Wash the part of your body where the surgery will be done  for at least 1 minute. Do not scrub. Do not use CHG on your head or face unless your health care provider tells you to. If it gets into your ears or eyes, rinse them well with water. Do not wash your genitals with CHG. Wash your back and under your arms. Make sure to wash skin folds. Let the CHG sit on your skin for 1-2 minutes or as long as told. Rinse your entire body in the shower, including all body creases and folds. Turn off the shower. Dry off with a clean towel. Do not put anything on your skin afterward, such as powder, lotion, or perfume. Put on clean clothes or pajamas. If it's the night before surgery, sleep in clean sheets. General tips Use CHG only as told, and follow the instructions on the label. Use the full amount of CHG as told. This is often one bottle. Do not smoke and stay away from flames after using CHG. Your skin may feel sticky after using CHG. This is normal. The sticky feeling will go away as the CHG dries. Do not use CHG: If you have a chlorhexidine allergy or have reacted to chlorhexidine in the past. On open wounds or areas of skin that have broken skin, cuts, or scrapes. On babies younger than 70 months of age. Contact a health care provider if: You have questions about using CHG. Your skin gets irritated or itchy. You have a rash after using CHG. You swallow any CHG. Call your local poison control center 252-110-0891 in the [place] Your eyes itch badly, or they become very red or swollen. Your hearing changes. You have trouble seeing. If you can't reach your provider, go to an urgent care or emergency room. Do not drive yourself. Get help right away if: You have swelling or tingling in  your mouth or throat. You make high-pitched whistling sounds when you breathe, most often when you breathe out (wheeze). You have trouble breathing. These symptoms may be an emergency. Call 911 right away. Do not wait to see if the symptoms will go away. Do not drive yourself to the hospital. This information is not intended to replace advice given to you by your health care provider. Make sure you discuss any questions you have with your health care provider. Document Revised: 07/14/2022 Document Reviewed: 07/10/2021 Elsevier Patient Education  2024 [employer].

## 2023-02-05 ENCOUNTER — Encounter (HOSPITAL_COMMUNITY)
Admission: RE | Admit: 2023-02-05 | Discharge: 2023-02-05 | Disposition: A | Discharge status: Home / Self Care | Payer: BC Managed Care – PPO | Source: Ambulatory Visit | Attending: Obstetrics & Gynecology | Admitting: Obstetrics & Gynecology

## 2023-02-05 ENCOUNTER — Encounter (HOSPITAL_COMMUNITY): Payer: Self-pay

## 2023-02-05 DIAGNOSIS — N921 Excessive and frequent menstruation with irregular cycle: Secondary | ICD-10-CM | POA: Insufficient documentation

## 2023-02-05 DIAGNOSIS — Z01812 Encounter for preprocedural laboratory examination: Secondary | ICD-10-CM | POA: Diagnosis not present

## 2023-02-05 LAB — CBC
HCT: 45.8 % (ref 36.0–46.0)
Hemoglobin: 15 g/dL (ref 12.0–15.0)
MCH: 30.9 pg (ref 26.0–34.0)
MCHC: 32.8 g/dL (ref 30.0–36.0)
MCV: 94.2 fL (ref 80.0–100.0)
Platelets: 318 10*3/uL (ref 150–400)
RBC: 4.86 MIL/uL (ref 3.87–5.11)
RDW: 12.3 % (ref 11.5–15.5)
WBC: 9.8 10*3/uL (ref 4.0–10.5)
nRBC: 0 % (ref 0.0–0.2)

## 2023-02-05 LAB — PREGNANCY, URINE: Preg Test, Ur: NEGATIVE

## 2023-02-06 ENCOUNTER — Encounter: Payer: Self-pay | Admitting: Obstetrics & Gynecology

## 2023-02-08 NOTE — H&P (Signed)
[facility] Obstetrics and Gynecology Attending History and Physical  Kathleen Velasquez is a 33 y.o. G0P0  who presents for scheduled, D&C, hydrothermal ablation  In review, she has struggled with heavy irregular menses for many years.  While this has been mostly regulated with NuvaRing, due to her age and tobacco use she will need to discontinue this in the next year.  Options have been reviewed with patient and she desires a more permanent surgical intervention.  Denies any abnormal vaginal discharge, fevers, chills, sweats, dysuria, nausea, vomiting, other GI or GU symptoms or other general symptoms.  Past Medical History:  Diagnosis Date   Abscess    right buttocks/thigh   Anxiety    Depression    DUB (dysfunctional uterine bleeding) 05/25/2012   Hydradenitis    Hypothyroidism    Polycystic ovaries    Thyroid disease    hypothyroidism   Past Surgical History:  Procedure Laterality Date   ADENOIDECTOMY     BALLOON DILATION N/A 12/19/2019   Procedure: BALLOON DILATION;  Surgeon: Lanelle Bal, DO;  [facility];  Service: Endoscopy;  Laterality: N/A;   BIOPSY  12/19/2019   Procedure: BIOPSY;  Surgeon: Lanelle Bal, DO;  [facility];  Service: Endoscopy;;   CHOLECYSTECTOMY  01/27/2012   Procedure: LAPAROSCOPIC CHOLECYSTECTOMY;  Surgeon: Fabio Bering, MD;  Location: [facility];  Service: General;  Laterality: N/A;   ESOPHAGOGASTRODUODENOSCOPY (EGD) WITH PROPOFOL N/A 12/19/2019   Procedure: ESOPHAGOGASTRODUODENOSCOPY (EGD) WITH PROPOFOL;  Surgeon: Lanelle Bal, DO;  [facility];  Service: Endoscopy;  Laterality: N/A;  3:00pm   HERNIA REPAIR Left    LIH   HYDRADENITIS EXCISION  10/02/2011   Procedure: EXCISION HYDRADENITIS AXILLA;  Surgeon: Fabio Bering, MD;  Location: [facility];  Service: General;  Laterality: Right;  Right Axillary Dissection   HYDRADENITIS EXCISION Left 10/19/2013   Procedure: INCISION OF HIDRADENITIS SUPPURATIVA  LEFT AXILLA;  Surgeon: Marlane Hatcher, MD;  Location: [facility];  Service: General;  Laterality: Left;   HYDRADENITIS EXCISION Left 09/09/2018   Procedure: EXCISION HIDRADENITIS AXILLA, LEFT;  Surgeon: Franky Macho, MD;  Location: [facility];  Service: General;  Laterality: Left;   SHOULDER SURGERY  08/2018   TONSILLECTOMY     OB History  Gravida Para Term Preterm AB Living  0     0  SAB IAB Ectopic Multiple Live Births        Patient denies any other pertinent gynecologic issues.  No current facility-administered medications on file prior to encounter.   Current Outpatient Medications on File Prior to Encounter  Medication Sig Dispense Refill   acetaminophen (TYLENOL) 325 MG tablet Take 650 mg by mouth every 6 (six) hours as needed for moderate pain (pain score 4-6).     amphetamine-dextroamphetamine (ADDERALL) 20 MG tablet Take 1 tablet (20 mg total) by mouth 2 (two) times daily. 60 tablet 0   clonazePAM (KLONOPIN) 1 MG tablet Take 1 tablet (1 mg total) by mouth daily as needed for anxiety. (Patient taking differently: Take 1 mg by mouth 2 (two) times daily as needed for anxiety.) 30 tablet 2   etonogestrel-ethinyl estradiol (NUVARING) 0.12-0.015 MG/24HR vaginal ring INSERT VAGINALLY AND LEAVE IN. PLACE FOR 3 CONSECUTIVE WEEKS, THEN REMOVE FOR 1 WEEK Strength: 0.12-0.015 MG/24HR 1 each 6   ibuprofen (ADVIL) 200 MG tablet Take 600 mg by mouth every 6 (six) hours as needed for moderate pain (pain score 4-6).     tirzepatide (ZEPBOUND)  15 MG/0.5ML Pen Inject 15 mg into the skin every Tuesday.     amphetamine-dextroamphetamine (ADDERALL) 20 MG tablet Take 1 tablet (20 mg total) by mouth 2 (two) times daily. (Patient not taking: Reported on 12/16/2022) 60 tablet 0   amphetamine-dextroamphetamine (ADDERALL) 20 MG tablet Take 1 tablet (20 mg total) by mouth 2 (two) times daily. (Patient not taking: Reported on 12/16/2022) 60 tablet 0   cetirizine (ZYRTEC ALLERGY) 10 MG tablet Take 1 tablet (10 mg  total) by mouth daily. 30 tablet 0   Allergies  Allergen Reactions   Bactrim Hives and Itching   Ciprofloxacin Hives and Itching    CRNA Discontinued preop antibiotic  IVPB  Cipro in OR after developed itching and hives left arm.  Received Benadryl with relief noted. Dr.Gonzalez and Dr. Leticia Penna informed. DDallasRN   Other Other (See Comments)    [place] causes a hives, swelling, itching, and anaphylaxis.    Penicillins Hives and Itching        Percocet [Oxycodone-Acetaminophen] Other (See Comments)    Face flushes   Vancomycin Itching    Social History:   reports that she has been smoking cigarettes. She has a 3.5 pack-year smoking history. She has never used smokeless tobacco. She reports that she does not currently use alcohol. She reports that she does not use drugs. Family History  Problem Relation Age of Onset   Diabetes Paternal Grandfather    Aneurysm Paternal Grandfather    Anxiety disorder Paternal Grandfather    Diabetes Paternal Grandmother    Diabetes Maternal Grandmother    Hypertension Maternal Grandmother    Heart disease Maternal Grandmother        CHF   COPD Maternal Grandmother    Cirrhosis Maternal Grandmother    Anxiety disorder Maternal Grandmother    Cancer Maternal Grandfather        lung   Drug abuse Father    Diabetes Mother    Hypertension Mother    Hyperlipidemia Mother    Neuropathy Mother    Heart failure Mother    ADD / ADHD Brother    Anxiety disorder Maternal Aunt    Anxiety disorder Maternal Uncle    Anxiety disorder Paternal Uncle    Depression Paternal Uncle    Stroke Other     Review of Systems: Pertinent items noted in HPI and remainder of comprehensive ROS otherwise negative.  PHYSICAL EXAM: Blood pressure 119/77, pulse 73, temperature 98.3 F (36.8 C), temperature source Oral, resp. rate 20, height 5\' 4"  (1.626 m), weight 70.3 kg, last menstrual period 02/05/2023, SpO2 99%. CONSTITUTIONAL: Well-developed, well-nourished female  in no acute distress.  SKIN: Skin is warm and dry. No rash noted. Not diaphoretic. No erythema. No pallor. NEUROLOGIC: Alert and oriented to person, place, and time. Normal reflexes, muscle tone coordination. No cranial nerve deficit noted. PSYCHIATRIC: Normal mood and affect. Normal behavior. Normal judgment and thought content. CARDIOVASCULAR: Normal heart rate noted, regular rhythm RESPIRATORY: Effort and breath sounds normal, no problems with respiration noted ABDOMEN: Soft, nontender, nondistended. PELVIC: deferred MUSCULOSKELETAL: no calf tenderness bilaterally EXT: no edema bilaterally, normal pulses  Labs: Results for orders placed or performed during the hospital encounter of 02/05/23 (from the past 2 weeks)  CBC   Collection Time: 02/05/23  8:14 AM  Result Value Ref Range   WBC 9.8 4.0 - 10.5 K/uL   RBC 4.86 3.87 - 5.11 MIL/uL   Hemoglobin 15.0 12.0 - 15.0 g/dL   HCT 16.1 09.6 - 04.5 %  MCV 94.2 80.0 - 100.0 fL   MCH 30.9 26.0 - 34.0 pg   MCHC 32.8 30.0 - 36.0 g/dL   RDW 16.1 09.6 - 04.5 %   Platelets 318 150 - 400 K/uL   nRBC 0.0 0.0 - 0.2 %  Pregnancy, urine   Collection Time: 02/05/23  8:14 AM  Result Value Ref Range   Preg Test, Ur NEGATIVE NEGATIVE    Imaging Studies: US PELVIC COMPLETE WITH TRANSVAGINAL Result Date: 01/27/2023 Images from the original result were not included.  ..an Financial trader of Ultrasound Medicine Technical sales engineer) accredited practice Center for [facility] [address] [place] [address] Ordering Provider: Myna Hidalgo, DO                 GYNECOLOGIC SONOGRAM SANDI TOWE is a 33 y.o. G0P0 No LMP recorded. (Menstrual status: Oral contraceptives). for a pelvic sonogram for AUB. Uterus                      4.7 x 2.7 x 3.9 cm, Total uterine volume 25.71 cc   Ut Length = 7.2 cm                                                Homogeneous myometrium, no focal mass seen Endometrium          5.6 mm, symmetrical, homogenous                              no evidence of intracavitary defects,  avascular cavity and canal. Right ovary             2.3 x .9 x 1.8 cm, mobile Left ovary                2.7 x 1.3 x 2.0 cm,       more difficult to tell about Left ov mobility due to Left ovary positioned higher and more lateral in the pelvis Neg CDS  -  no free fluid present Technician Comments: [place]:  TA and TV images obtained - Chaperone:  Emma -  vinyl probe cover used Anteverted uterus normal in size,  EEC symmetrical and homogenous = 5.6 mm in thickness, Homogeneous myometrium, no focal mass seen no evidence of intracavitary defects,  avascular cavity and canal. Normal ovaries,  Rt ov appears mobile,  more difficult to tell about Left ov mobility due to Left ovary positioned higher and more lateral in the pelvis Neg adnexal regions - no evidence of pelvic mass or fluid collections Neg CDS  -  no free fluid present Wylie Hail 01/27/2023 9:49 AM Clinical Impression and recommendations: I have reviewed the sonogram results above, combined with the patient's current clinical course, below are my impressions and any appropriate recommendations for management based on the sonographic findings. Normal uterine size and shape- anteverted. Normal endometrium Normal ovaries bilaterally. Unremarkable pelvic US, no acute abnormalities noted. Myna Hidalgo, DO Attending Obstetrician & Gynecologist, [facility] for [facility], [facility]     Assessment: Heavy Menstrual bleeding  Plan: Hysteroscopy, D&C, hydrothermal ablation -IV Toradol -NPO -LR @ 125cc/hr -SCDs to OR -Risk/benefits and alternatives reviewed with the patient including but not limited to risk of bleeding, infection and injury to surrounding organs  requiring further surgical intervention.  Also discussed potential failure of the procedure.  Questions and concerns were addressed and pt desires to proceed  Myna Hidalgo, DO Attending Obstetrician  & Gynecologist, [facility] for [facility], [facility]

## 2023-02-10 ENCOUNTER — Other Ambulatory Visit: Payer: Self-pay | Admitting: Obstetrics & Gynecology

## 2023-02-10 ENCOUNTER — Encounter (HOSPITAL_COMMUNITY): Payer: Self-pay | Admitting: Obstetrics & Gynecology

## 2023-02-10 ENCOUNTER — Ambulatory Visit (HOSPITAL_COMMUNITY): Payer: BC Managed Care – PPO | Admitting: Certified Registered"

## 2023-02-10 ENCOUNTER — Ambulatory Visit (HOSPITAL_COMMUNITY)
Admission: RE | Admit: 2023-02-10 | Discharge: 2023-02-10 | Disposition: A | Discharge status: Home / Self Care | Payer: BC Managed Care – PPO | Attending: Obstetrics & Gynecology | Admitting: Obstetrics & Gynecology

## 2023-02-10 ENCOUNTER — Ambulatory Visit (HOSPITAL_COMMUNITY): Payer: Self-pay | Admitting: Certified Registered"

## 2023-02-10 ENCOUNTER — Encounter: Payer: Self-pay | Admitting: Obstetrics & Gynecology

## 2023-02-10 ENCOUNTER — Other Ambulatory Visit: Payer: Self-pay

## 2023-02-10 ENCOUNTER — Emergency Department (HOSPITAL_COMMUNITY)
Admission: EM | Admit: 2023-02-10 | Discharge: 2023-02-10 | Discharge status: Against Medical Advice | Payer: BC Managed Care – PPO | Source: Home / Self Care

## 2023-02-10 ENCOUNTER — Encounter (HOSPITAL_COMMUNITY)
Admission: RE | Disposition: A | Discharge status: Home / Self Care | Payer: Self-pay | Source: Home / Self Care | Attending: Obstetrics & Gynecology

## 2023-02-10 ENCOUNTER — Telehealth: Payer: Self-pay | Admitting: Obstetrics & Gynecology

## 2023-02-10 DIAGNOSIS — N921 Excessive and frequent menstruation with irregular cycle: Secondary | ICD-10-CM

## 2023-02-10 DIAGNOSIS — N858 Other specified noninflammatory disorders of uterus: Secondary | ICD-10-CM | POA: Diagnosis not present

## 2023-02-10 DIAGNOSIS — E282 Polycystic ovarian syndrome: Secondary | ICD-10-CM | POA: Diagnosis not present

## 2023-02-10 DIAGNOSIS — N938 Other specified abnormal uterine and vaginal bleeding: Secondary | ICD-10-CM

## 2023-02-10 DIAGNOSIS — I10 Essential (primary) hypertension: Secondary | ICD-10-CM | POA: Diagnosis not present

## 2023-02-10 DIAGNOSIS — Z793 Long term (current) use of hormonal contraceptives: Secondary | ICD-10-CM | POA: Insufficient documentation

## 2023-02-10 DIAGNOSIS — Z7985 Long-term (current) use of injectable non-insulin antidiabetic drugs: Secondary | ICD-10-CM | POA: Insufficient documentation

## 2023-02-10 DIAGNOSIS — F17210 Nicotine dependence, cigarettes, uncomplicated: Secondary | ICD-10-CM | POA: Insufficient documentation

## 2023-02-10 DIAGNOSIS — N920 Excessive and frequent menstruation with regular cycle: Secondary | ICD-10-CM | POA: Insufficient documentation

## 2023-02-10 DIAGNOSIS — N840 Polyp of corpus uteri: Secondary | ICD-10-CM | POA: Diagnosis not present

## 2023-02-10 HISTORY — PX: DILITATION & CURRETTAGE/HYSTROSCOPY WITH HYDROTHERMAL ABLATION: SHX5570

## 2023-02-10 SURGERY — DILATATION & CURETTAGE/HYSTEROSCOPY WITH HYDROTHERMAL ABLATION
Anesthesia: General

## 2023-02-10 MED ORDER — CHLORHEXIDINE GLUCONATE 0.12 % MT SOLN
15.0000 mL | Freq: Once | OROMUCOSAL | Status: DC
Start: 2023-02-10 — End: 2023-02-10

## 2023-02-10 MED ORDER — DEXAMETHASONE SODIUM PHOSPHATE 10 MG/ML IJ SOLN
INTRAMUSCULAR | Status: AC
  Filled 2023-02-10: qty 1

## 2023-02-10 MED ORDER — PROPOFOL 10 MG/ML IV BOLUS
INTRAVENOUS | Status: AC
  Filled 2023-02-10: qty 20

## 2023-02-10 MED ORDER — HYDROCODONE-ACETAMINOPHEN 5-325 MG PO TABS: ORAL_TABLET | ORAL | 0 refills | Status: DC

## 2023-02-10 MED ORDER — ONDANSETRON HCL 4 MG/2ML IJ SOLN
INTRAMUSCULAR | Status: DC | PRN
  Administered 2023-02-10: 4 mg via INTRAVENOUS

## 2023-02-10 MED ORDER — FENTANYL CITRATE PF 50 MCG/ML IJ SOSY: 25.0000 ug | PREFILLED_SYRINGE | INTRAMUSCULAR | Status: DC | PRN

## 2023-02-10 MED ORDER — OXYCODONE HCL 5 MG PO TABS: 5.0000 mg | ORAL_TABLET | Freq: Once | ORAL | Status: DC | PRN

## 2023-02-10 MED ORDER — FENTANYL CITRATE (PF) 100 MCG/2ML IJ SOLN
INTRAMUSCULAR | Status: DC | PRN
  Administered 2023-02-10: 100 ug via INTRAVENOUS

## 2023-02-10 MED ORDER — FENTANYL CITRATE (PF) 100 MCG/2ML IJ SOLN
INTRAMUSCULAR | Status: AC
  Filled 2023-02-10: qty 2

## 2023-02-10 MED ORDER — EPHEDRINE 5 MG/ML INJ
INTRAVENOUS | Status: AC
Start: 2023-02-10 — End: ?
  Filled 2023-02-10: qty 5

## 2023-02-10 MED ORDER — PROMETHAZINE HCL 25 MG PO TABS: 25.0000 mg | ORAL_TABLET | Freq: Four times a day (QID) | ORAL | 0 refills | Status: DC | PRN

## 2023-02-10 MED ORDER — MIDAZOLAM HCL 2 MG/2ML IJ SOLN
INTRAMUSCULAR | Status: AC
  Filled 2023-02-10: qty 2

## 2023-02-10 MED ORDER — PROPOFOL 10 MG/ML IV BOLUS
INTRAVENOUS | Status: DC | PRN
  Administered 2023-02-10: 200 mg via INTRAVENOUS

## 2023-02-10 MED ORDER — DEXAMETHASONE SODIUM PHOSPHATE 10 MG/ML IJ SOLN
INTRAMUSCULAR | Status: DC | PRN
  Administered 2023-02-10: 5 mg via INTRAVENOUS

## 2023-02-10 MED ORDER — OXYCODONE HCL 5 MG/5ML PO SOLN: 5.0000 mg | Freq: Once | ORAL | Status: DC | PRN

## 2023-02-10 MED ORDER — KETOROLAC TROMETHAMINE 30 MG/ML IJ SOLN
30.0000 mg | INTRAMUSCULAR | Status: AC
  Administered 2023-02-10: 30 mg via INTRAVENOUS
  Filled 2023-02-10: qty 1

## 2023-02-10 MED ORDER — LIDOCAINE-EPINEPHRINE 0.5 %-1:200000 IJ SOLN
INTRAMUSCULAR | Status: DC | PRN
  Administered 2023-02-10: 30 mL

## 2023-02-10 MED ORDER — MIDAZOLAM HCL 2 MG/2ML IJ SOLN
INTRAMUSCULAR | Status: DC | PRN
  Administered 2023-02-10: 2 mg via INTRAVENOUS

## 2023-02-10 MED ORDER — DEXMEDETOMIDINE HCL IN NACL 80 MCG/20ML IV SOLN
INTRAVENOUS | Status: DC | PRN
  Administered 2023-02-10: 8 ug via INTRAVENOUS

## 2023-02-10 MED ORDER — LIDOCAINE-EPINEPHRINE (PF) 1 %-1:200000 IJ SOLN
INTRAMUSCULAR | Status: AC
Start: 2023-02-10 — End: ?
  Filled 2023-02-10: qty 30

## 2023-02-10 MED ORDER — LACTATED RINGERS IV SOLN: INTRAVENOUS | Status: DC

## 2023-02-10 MED ORDER — ONDANSETRON 4 MG PO TBDP: 4.0000 mg | ORAL_TABLET | Freq: Three times a day (TID) | ORAL | 0 refills | Status: DC | PRN

## 2023-02-10 MED ORDER — ROCURONIUM BROMIDE 10 MG/ML (PF) SYRINGE
PREFILLED_SYRINGE | INTRAVENOUS | Status: AC
Start: 2023-02-10 — End: ?
  Filled 2023-02-10: qty 20

## 2023-02-10 MED ORDER — ORAL CARE MOUTH RINSE
15.0000 mL | Freq: Once | OROMUCOSAL | Status: DC
Start: 2023-02-10 — End: 2023-02-10

## 2023-02-10 MED ORDER — ACETAMINOPHEN 500 MG PO TABS
1000.0000 mg | ORAL_TABLET | Freq: Four times a day (QID) | ORAL | Status: DC | PRN
  Administered 2023-02-10: 1000 mg via ORAL

## 2023-02-10 MED ORDER — LACTATED RINGERS IV SOLN: INTRAVENOUS | Status: DC | PRN

## 2023-02-10 MED ORDER — ONDANSETRON HCL 4 MG/2ML IJ SOLN
INTRAMUSCULAR | Status: AC
  Filled 2023-02-10: qty 2

## 2023-02-10 MED ORDER — SODIUM CHLORIDE 0.9 % IR SOLN
Status: DC | PRN
  Administered 2023-02-10: 3000 mL

## 2023-02-10 MED ORDER — ONDANSETRON HCL 4 MG/2ML IJ SOLN: 4.0000 mg | Freq: Once | INTRAMUSCULAR | Status: DC | PRN

## 2023-02-10 MED ORDER — ACETAMINOPHEN 500 MG PO TABS
ORAL_TABLET | ORAL | Status: AC
Start: 2023-02-10 — End: ?
  Filled 2023-02-10: qty 2

## 2023-02-10 SURGICAL SUPPLY — 20 items
CATH ROBINSON RED A/P 16FR (CATHETERS) ×1 IMPLANT
CLOTH BEACON ORANGE TIMEOUT ST (SAFETY) ×1 IMPLANT
COVER LIGHT HANDLE STERIS (MISCELLANEOUS) ×2 IMPLANT
GAUZE 4X4 16PLY ~~LOC~~+RFID DBL (SPONGE) ×1 IMPLANT
GLOVE BIO SURGEON STRL SZ 6.5 (GLOVE) ×1 IMPLANT
GLOVE BIOGEL PI IND STRL 7.0 (GLOVE) ×3 IMPLANT
GOWN STRL REUS W/ TWL LRG LVL3 (GOWN DISPOSABLE) ×1 IMPLANT
GOWN STRL REUS W/TWL LRG LVL3 (GOWN DISPOSABLE) ×1 IMPLANT
IV NS IRRIG 3000ML ARTHROMATIC (IV SOLUTION) ×1 IMPLANT
KIT TURNOVER CYSTO (KITS) ×1 IMPLANT
KIT TURNOVER KIT A (KITS) ×1 IMPLANT
NS IRRIG 1000ML POUR BTL (IV SOLUTION) ×1 IMPLANT
PACK PERI GYN (CUSTOM PROCEDURE TRAY) ×1 IMPLANT
PAD ARMBOARD 7.5X6 YLW CONV (MISCELLANEOUS) ×1 IMPLANT
PAD TELFA 3X4 1S STER (GAUZE/BANDAGES/DRESSINGS) ×1 IMPLANT
POSITIONER HEAD 8X9X4 ADT (SOFTGOODS) ×1 IMPLANT
SET BASIN LINEN APH (SET/KITS/TRAYS/PACK) ×1 IMPLANT
SET GENESYS HTA PROCERVA (MISCELLANEOUS) ×1 IMPLANT
SYR CONTROL 10ML LL (SYRINGE) ×1 IMPLANT
UNDERPAD 30X36 HEAVY ABSORB (UNDERPADS AND DIAPERS) ×1 IMPLANT

## 2023-02-10 NOTE — Anesthesia Preprocedure Evaluation (Signed)
Anesthesia Evaluation  Patient identified by MRN, date of birth, ID band Patient awake    Reviewed: Allergy & Precautions, H&P , NPO status , Patient's Chart, lab work & pertinent test results, reviewed documented beta blocker date and time   Airway Mallampati: II  TM Distance: >3 FB Neck ROM: full    Dental no notable dental hx.    Pulmonary neg pulmonary ROS, Current Smoker   Pulmonary exam normal breath sounds clear to auscultation       Cardiovascular Exercise Tolerance: Good hypertension, negative cardio ROS  Rhythm:regular Rate:Normal     Neuro/Psych  PSYCHIATRIC DISORDERS Anxiety Depression    negative neurological ROS  negative psych ROS   GI/Hepatic negative GI ROS, Neg liver ROS,,,  Endo/Other  negative endocrine ROSHypothyroidism    Renal/GU negative Renal ROS  negative genitourinary   Musculoskeletal   Abdominal   Peds  Hematology negative hematology ROS (+)   Anesthesia Other Findings   Reproductive/Obstetrics negative OB ROS                             Anesthesia Physical Anesthesia Plan  ASA: 2  Anesthesia Plan: General and General LMA   Post-op Pain Management:    Induction:   PONV Risk Score and Plan: Ondansetron  Airway Management Planned:   Additional Equipment:   Intra-op Plan:   Post-operative Plan:   Informed Consent: I have reviewed the patients History and Physical, chart, labs and discussed the procedure including the risks, benefits and alternatives for the proposed anesthesia with the patient or authorized representative who has indicated his/her understanding and acceptance.     Dental Advisory Given  Plan Discussed with: CRNA  Anesthesia Plan Comments:        Anesthesia Quick Evaluation

## 2023-02-10 NOTE — Progress Notes (Signed)
Rx for zofran

## 2023-02-10 NOTE — Telephone Encounter (Signed)
Pt requesting phone call due to significant pain Pt already tried zofran and vomiting x 2 Notes considerable pelvic pain, unable to get comfortable  Rx sent in for phenergan to take now then in 30-1hr take pain medication Pt feels as though she needs to go ER for IV pain management  Advised that she try above medication first as it can be common to have intense cramping from surgery  Pt agreeable to above  Meds ordered this encounter  Medications   promethazine (PHENERGAN) 25 MG tablet    Sig: Take 1 tablet (25 mg total) by mouth every 6 (six) hours as needed for nausea or vomiting.    Dispense:  20 tablet    Refill:  0   HYDROcodone-acetaminophen (NORCO/VICODIN) 5-325 MG tablet    Sig: Take 1-2 tablets every 6 hours as needed for pain    Dispense:  10 tablet    Refill:  0

## 2023-02-10 NOTE — Discharge Instructions (Signed)
HOME INSTRUCTIONS  Please note any unusual or excessive bleeding, pain, swelling. Mild dizziness or drowsiness are normal for about 24 hours after surgery.   Shower when comfortable  Restrictions: No driving for 24 hours or while taking pain medications.  Activity:  No heavy lifting (> 20 lbs), nothing in vagina (no tampons, douching, or intercourse) x 2 weeks; no tub baths for 2 weeks Vaginal spotting is expected but if your bleeding is heavy, period like,  please call the office   Diet:  You may return to your regular diet.  Do not eat large meals.  Eat small frequent meals throughout the day.  Continue to drink a good amount of water at least 6-8 glasses of water per day, hydration is very important for the healing process.  Pain Management: Take over the counter tylenol or ibuprofen as needed for pain.  You can either take one or alternate between the two medications for pain management.  You may also use a heating pack as needed.    Alcohol -- Avoid for 24 hours and while taking pain medications.  Nausea: Take sips of ginger ale or soda  Fever -- Call physician if temperature over 101 degrees  Follow up:  If you have any acute problems, please call the office at [phone].  If you experience fever (a temperature greater than 100.4), pain unrelieved by pain medication, shortness of breath, swelling of a single leg, or any other symptoms which are concerning to you please the office immediately.

## 2023-02-10 NOTE — Op Note (Signed)
Operative Report  PreOp: 1) Heavy menstrual bleeding PostOp: same Procedure:  Hysteroscopy, Dilation and Curettage, Endometrial ablation Surgeon: Dr. Myna Hidalgo Anesthesia: General Complications:none EBL: Minimal IVF:1000cc  Findings: 5cm uterus with proliferative endometrium.  Both ostia visualized.  Specimens: endometrial curettings  Procedure: The patient was taken to the operating room where she underwent general anesthesia without difficulty. The patient was placed in a low lithotomy position using Allen stirrups. She was then prepped and draped in the normal sterile fashion. Sterile speculum was placed.  A single tooth tenaculum was placed on the anterior lip of the cervix. Cervical block was completed using 0.5% lidocaine with epinephrine.   The uterus was then sounded to 5. The endocervical canal was then serially dilated to 12French using Hank dilators to accommodate the hydrothermal ablation hysteroscopic apparatus.  The hysteroscope was inserted and findings were visualized as noted above.  The hysteroscope was removed and sharp curettage was performed. The tissue was sent to pathology.   The hydrothermal ablation hysteroscopic apparatus was then reinserted.  Cavity assessment was performed and passed with minimal leakage of fluid.  The hydrothermal ablation was then carried out as per protocol.   Complete ablation of the endometrium was observed and the hysteroscope was removed under direct visualization.  All instrument were then removed. Hemostasis was observed at the cervical site. The patient was repositioned to the supine position. The patient tolerated the procedure without any complications and taken to recovery in stable condition.   Myna Hidalgo, DO Attending Obstetrician & Gynecologist, [facility] for [employer], [facility]

## 2023-02-10 NOTE — Anesthesia Procedure Notes (Signed)
Procedure Name: LMA Insertion Date/Time: 02/10/2023 10:32 AM  Performed by: Shanon Payor, CRNAPre-anesthesia Checklist: Patient identified, Emergency Drugs available, Suction available, Patient being monitored and Timeout performed Patient Re-evaluated:Patient Re-evaluated prior to induction Oxygen Delivery Method: Circle system utilized Preoxygenation: Pre-oxygenation with 100% oxygen Induction Type: IV induction LMA: LMA inserted LMA Size: 4.0 Number of attempts: 1 Placement Confirmation: positive ETCO2, CO2 detector and breath sounds checked- equal and bilateral Tube secured with: Tape Dental Injury: Teeth and Oropharynx as per pre-operative assessment

## 2023-02-10 NOTE — Transfer of Care (Signed)
Immediate Anesthesia Transfer of Care Note  Patient: Kathleen Velasquez  Procedure(s) Performed: DILATATION & CURETTAGE/HYSTEROSCOPY WITH HYDROTHERMAL ABLATION  Patient Location: PACU  Anesthesia Type:General  Level of Consciousness: awake, alert , oriented, and patient cooperative  Airway & Oxygen Therapy: Patient Spontanous Breathing and Patient connected to face mask oxygen  Post-op Assessment: Report given to RN, Post -op Vital signs reviewed and stable, and Patient moving all extremities X 4  Post vital signs: Reviewed and stable  Last Vitals:  Vitals Value Taken Time  BP    Temp 37.2 C 02/10/23 1117  Pulse 84 02/10/23 1122  Resp 17 02/10/23 1122  SpO2 100 % 02/10/23 1122  Vitals shown include unfiled device data.  Last Pain:  Vitals:   02/10/23 0947  TempSrc: Oral  PainSc: 0-No pain      Patients Stated Pain Goal: 4 (02/10/23 0947)  Complications: No notable events documented.

## 2023-02-11 ENCOUNTER — Encounter (HOSPITAL_COMMUNITY): Payer: Self-pay | Admitting: Obstetrics & Gynecology

## 2023-02-11 ENCOUNTER — Encounter: Payer: Self-pay | Admitting: Obstetrics & Gynecology

## 2023-02-12 ENCOUNTER — Other Ambulatory Visit: Payer: Self-pay | Admitting: *Deleted

## 2023-02-12 LAB — SURGICAL PATHOLOGY

## 2023-02-13 NOTE — Anesthesia Postprocedure Evaluation (Signed)
Anesthesia Post Note  Patient: Kathleen Velasquez  Procedure(s) Performed: DILATATION & CURETTAGE/HYSTEROSCOPY WITH HYDROTHERMAL ABLATION  Patient location during evaluation: Phase II Anesthesia Type: General Level of consciousness: awake Pain management: pain level controlled Vital Signs Assessment: post-procedure vital signs reviewed and stable Respiratory status: spontaneous breathing and respiratory function stable Cardiovascular status: blood pressure returned to baseline and stable Postop Assessment: no headache and no apparent nausea or vomiting Anesthetic complications: no Comments: Late entry   No notable events documented.   Last Vitals:  Vitals:   02/10/23 1145 02/10/23 1211  BP: 120/73   Pulse: 78 84  Resp: 18 16  Temp:  36.7 C  SpO2: 98%     Last Pain:  Vitals:   02/11/23 1357  TempSrc:   PainSc: 0-No pain                 Windell Norfolk

## 2023-02-18 ENCOUNTER — Encounter: Payer: BC Managed Care – PPO | Admitting: Obstetrics & Gynecology

## 2023-02-19 ENCOUNTER — Other Ambulatory Visit (HOSPITAL_COMMUNITY)
Admission: RE | Admit: 2023-02-19 | Discharge: 2023-02-19 | Disposition: A | Discharge status: Home / Self Care | Payer: BC Managed Care – PPO | Source: Ambulatory Visit | Attending: Obstetrics & Gynecology | Admitting: Obstetrics & Gynecology

## 2023-02-19 ENCOUNTER — Ambulatory Visit: Payer: BC Managed Care – PPO | Admitting: Obstetrics & Gynecology

## 2023-02-19 ENCOUNTER — Encounter: Payer: Self-pay | Admitting: Obstetrics & Gynecology

## 2023-02-19 VITALS — BP 137/88 | HR 92 | Ht 64.0 in | Wt 160.0 lb

## 2023-02-19 DIAGNOSIS — G8918 Other acute postprocedural pain: Secondary | ICD-10-CM | POA: Diagnosis not present

## 2023-02-19 DIAGNOSIS — N898 Other specified noninflammatory disorders of vagina: Secondary | ICD-10-CM

## 2023-02-19 NOTE — Progress Notes (Signed)
    PostOp Visit Note  Kathleen Velasquez is a 33 y.o. G0P0 female who presents for a postoperative visit. She is 1 week postop following a hysteroscopy, ablation completed on 1/29   Today she notes that she is concerned that her pain is still interrupting day to day activity.  Notes yellow copious discharge- feels like she is peeing herself notes slight odor.  Doesn't seem like an infection or yeast.  Notes pain right above pubic area- dull ache- worse with any activity.  Random sharp pain in low back that seems constant.  No improvement with ibuprofen , using heating pad with minimal improvement.  Over time it has very slowly over started to improve.  Overall states she is not meeting her postop milestones as expected.  Denies fever or chills.  Tolerating gen diet.  +Flatus, Regular BMs.  No nausea/vomiting.  No other acute complaints  Review of Systems Pertinent items are noted in HPI.    Objective:  BP 137/88 (BP Location: Right Arm, Patient Position: Sitting, Cuff Size: Normal)   Pulse 92   Ht 5' 4 (1.626 m)   Wt 160 lb (72.6 kg)   LMP 02/05/2023   BMI 27.46 kg/m    Physical Examination:  GENERAL ASSESSMENT: well developed and well nourished SKIN: normal color, no lesions CHEST: normal air exchange, respiratory effort normal with no retractions HEART: regular rate and rhythm ABDOMEN: soft, non-distended, appropriately tender GU: normal external genitalia, pink vaginal mucosa-minimal clear to yellow thin discharge noted.  Cervix visualized- no abnormalities noted.    EXTREMITY: no edema no calf tenderness bilaterally PSYCH: mood appropriate, normal affect       Assessment:    Postop visit   Plan:    -no acute abnormalities noted on exam -reviewed that healing process may be different for everyone and anticipate her pelvic cramping/discomfort may last longer than routine -discussed concerns that based on symptoms, suspect may have endometriosis -encouraged pelvic rest  and decreased activity level -continue with OTC meds and heating pack -plan to r/o underlying infection  Verna Desrocher, DO Attending Obstetrician & Gynecologist, [facility] for [employer], [facility]

## 2023-02-22 ENCOUNTER — Encounter: Payer: Self-pay | Admitting: Obstetrics & Gynecology

## 2023-02-22 LAB — CERVICOVAGINAL ANCILLARY ONLY
Bacterial Vaginitis (gardnerella): NEGATIVE
Candida Glabrata: NEGATIVE
Candida Vaginitis: NEGATIVE
Comment: NEGATIVE
Comment: NEGATIVE
Comment: NEGATIVE

## 2023-02-22 IMAGING — CT CT HEAD W/O CM
3 series · 15 of 47 positions shown, 18 images · non-contrast
Comparison: CT head 06/07/2014

CLINICAL DATA: Headache and dizziness 1 week.  Vision change

EXAM:
CT HEAD WITHOUT CONTRAST
TECHNIQUE: Contiguous axial images were obtained from the base of the skull
through the vertex without intravenous contrast.

[Series 2: head w o · axial · 0.43mm/px · z∈[+15,+145]mm · 9 of 32 slices shown, 12 images]
[im 3/32  brain]
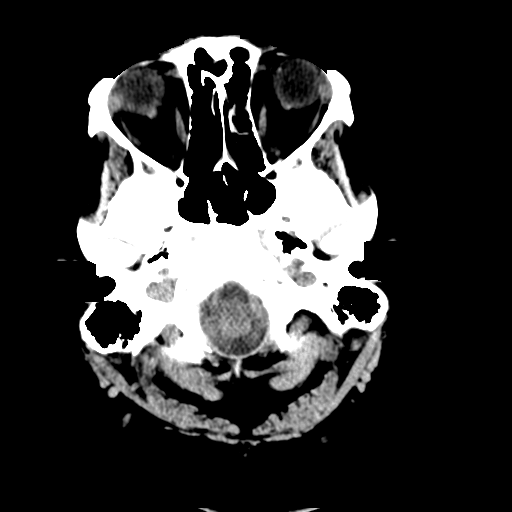
[im 3/32  bone]
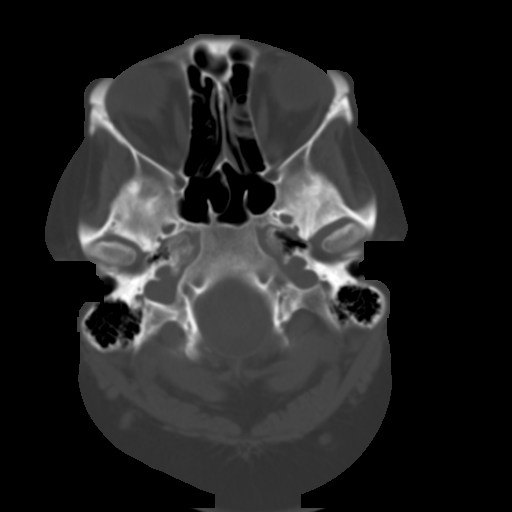
[im 6/32  brain]
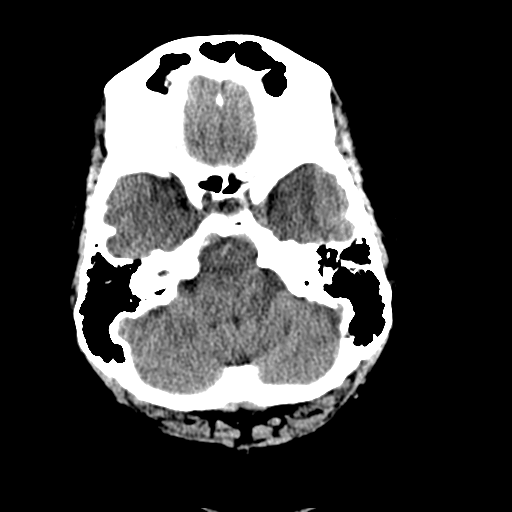
[im 9/32  brain]
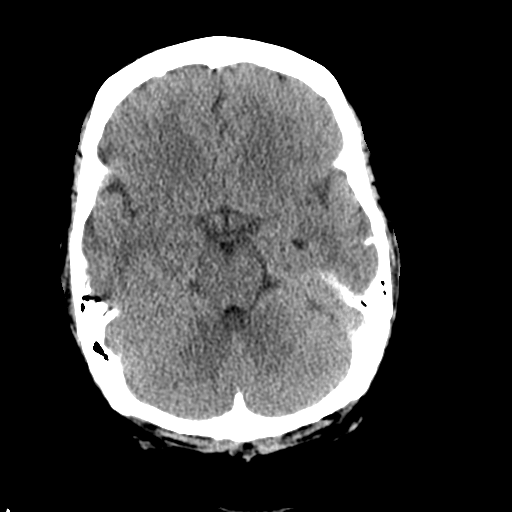
[im 12/32  brain]
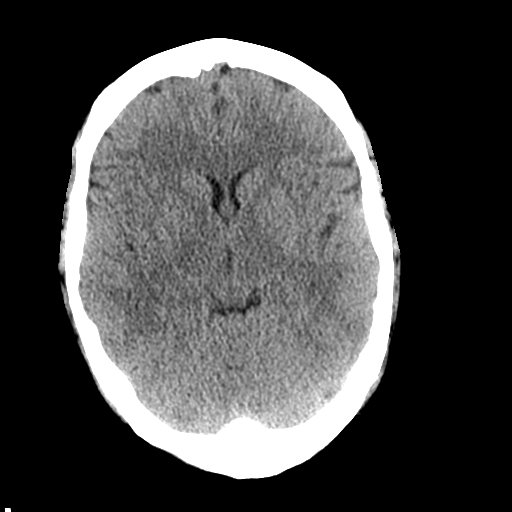
[im 17/32  brain]
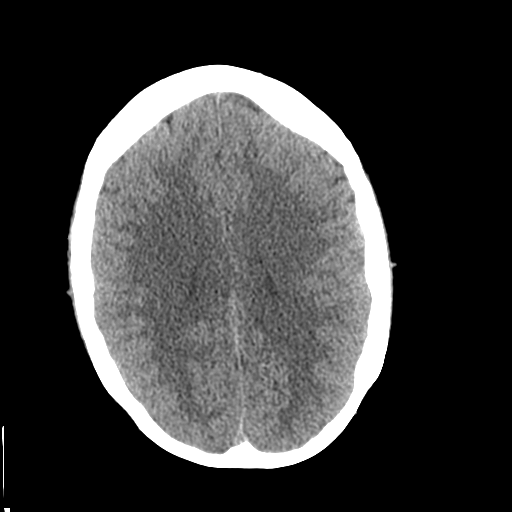
[im 17/32  bone]
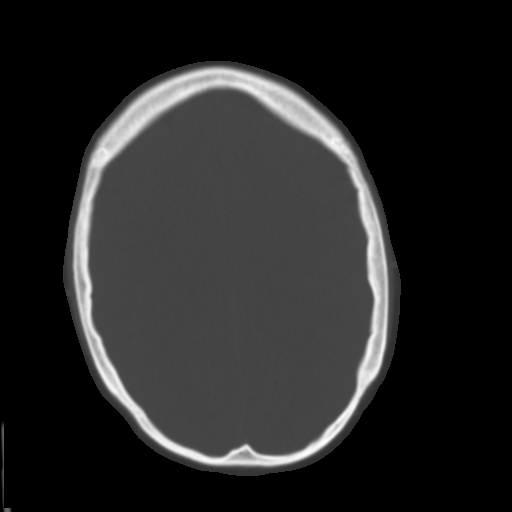
[im 20/32  brain]
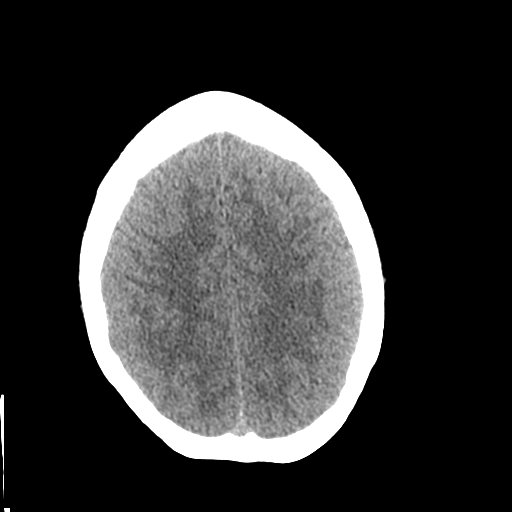
[im 23/32  brain]
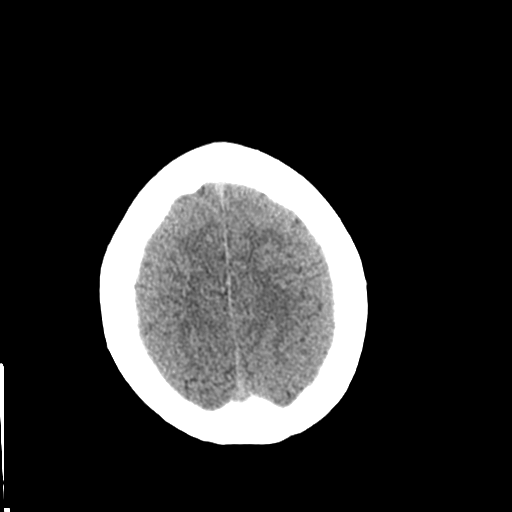
[im 26/32  brain]
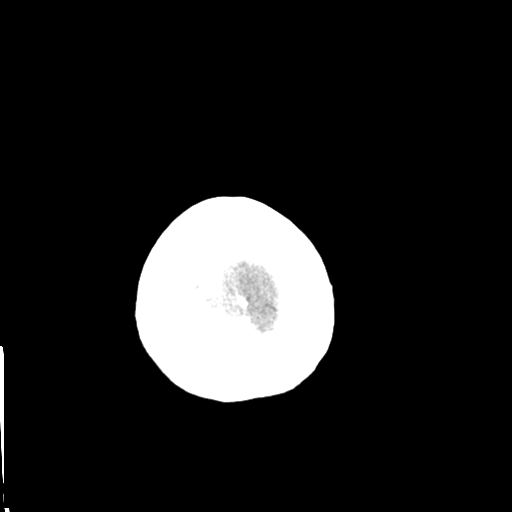
[im 29/32  brain]
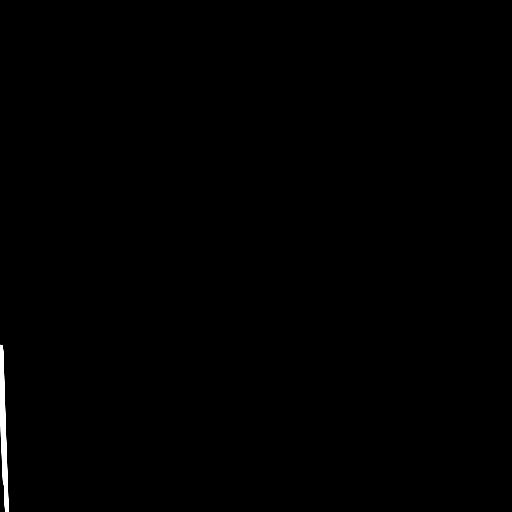
[im 29/32  bone]
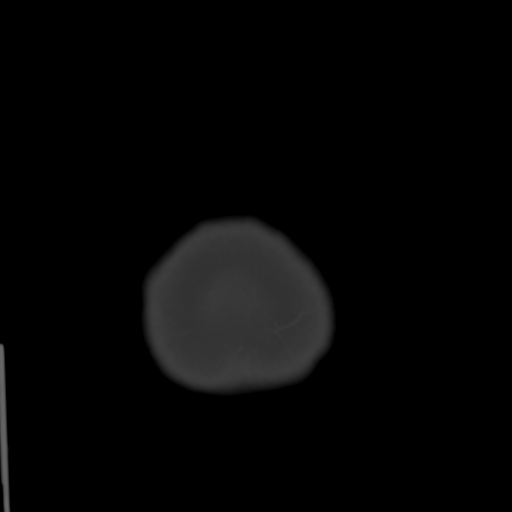

[Series 4: coronal soft · coronal · 0.30mm/px · 3 of 70 slices shown]
[im 24/70  brain]
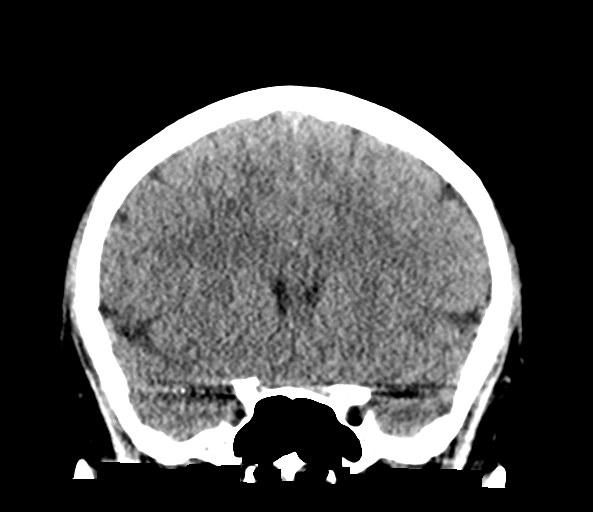
[im 31/70  brain]
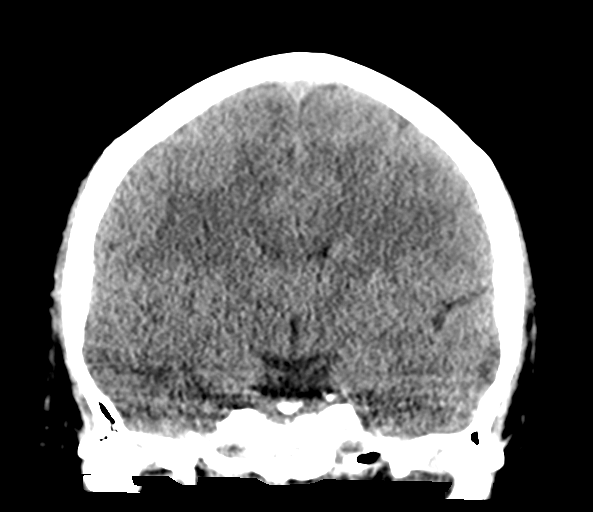
[im 39/70  brain]
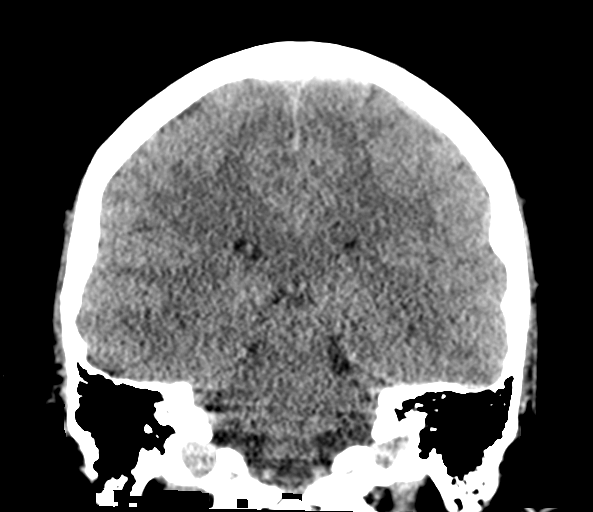

[Series 5: sagittal soft · sagittal · 0.30mm/px · 3 of 59 slices shown]
[im 20/59  brain]
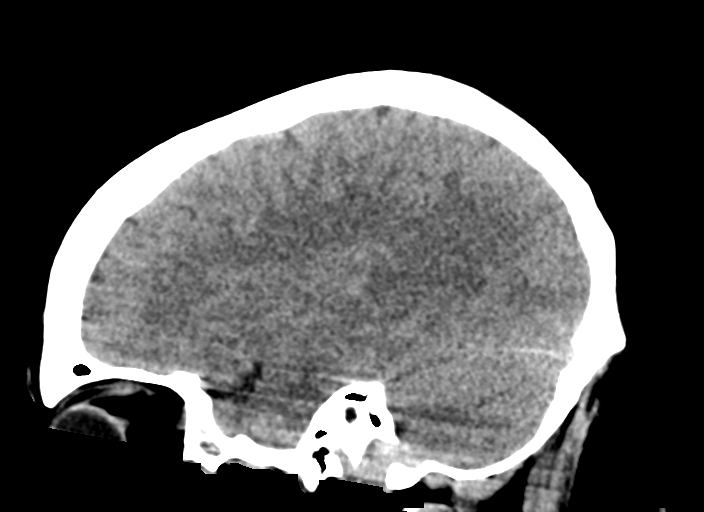
[im 30/59  brain]
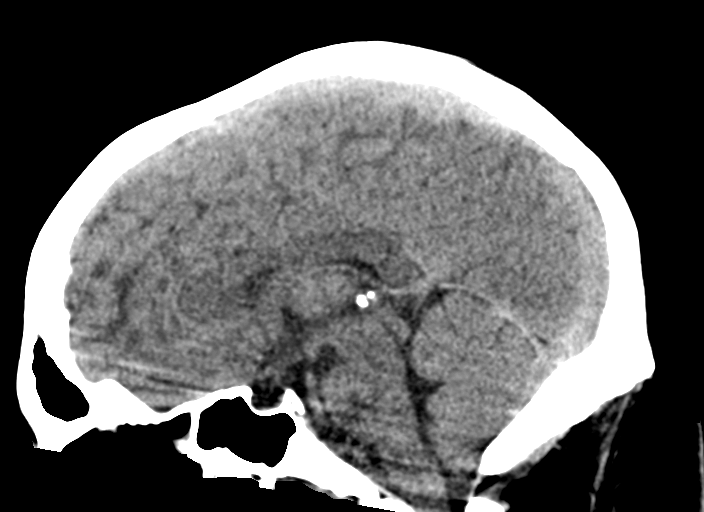
[im 39/59  brain]
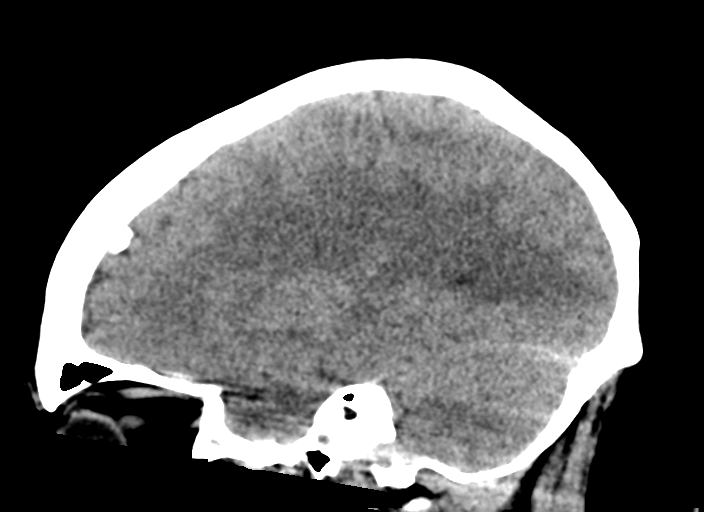

[15 of 47 positions shown; findings below may reference images not displayed]

FINDINGS: Brain: No evidence of acute infarction, hemorrhage, hydrocephalus,
extra-axial collection or mass lesion/mass effect. Dural
calcification in the frontal lobes bilaterally with progression
since 5463. No edema in the adjacent brain.

Vascular: Negative for hyperdense vessel

Skull: Negative

Sinuses/Orbits: Negative

Other: None
IMPRESSION: No acute abnormality.

Dural base calcifications in the frontal lobes bilaterally with
progression since 5463. Probable degenerative calcification.

## 2023-03-06 DIAGNOSIS — R519 Headache, unspecified: Secondary | ICD-10-CM | POA: Diagnosis not present

## 2023-03-06 DIAGNOSIS — Z2089 Contact with and (suspected) exposure to other communicable diseases: Secondary | ICD-10-CM | POA: Diagnosis not present

## 2023-03-06 DIAGNOSIS — B349 Viral infection, unspecified: Secondary | ICD-10-CM | POA: Diagnosis not present

## 2023-04-20 ENCOUNTER — Ambulatory Visit
Admission: RE | Admit: 2023-04-20 | Discharge: 2023-04-20 | Disposition: A | Discharge status: Home / Self Care | Source: Ambulatory Visit | Attending: Nurse Practitioner | Admitting: Nurse Practitioner

## 2023-04-20 VITALS — BP 129/85 | HR 74 | Temp 98.0°F | Resp 18

## 2023-04-20 DIAGNOSIS — J101 Influenza due to other identified influenza virus with other respiratory manifestations: Secondary | ICD-10-CM

## 2023-04-20 LAB — POC COVID19/FLU A&B COMBO
Covid Antigen, POC: NEGATIVE
Influenza A Antigen, POC: NEGATIVE
Influenza B Antigen, POC: POSITIVE — AB

## 2023-04-20 MED ORDER — OSELTAMIVIR PHOSPHATE 75 MG PO CAPS: 75.0000 mg | ORAL_CAPSULE | Freq: Two times a day (BID) | ORAL | 0 refills | Status: AC

## 2023-04-20 MED ORDER — BENZONATATE 100 MG PO CAPS: 100.0000 mg | ORAL_CAPSULE | Freq: Three times a day (TID) | ORAL | 0 refills | Status: DC | PRN

## 2023-04-20 NOTE — ED Provider Notes (Signed)
 [facility]    CSN: [id number] Arrival date & time: 04/20/23  1751      History   Chief Complaint Chief Complaint  Patient presents with   Cough    Runny nose sore throat couch stuffy nose pains fever. - Entered by patient    HPI Kathleen Velasquez is a 33 y.o. female.   Patient presents today with 1 day history of low-grade fever, body aches, congested cough, chest pain when she coughs, runny and stuffy nose, sore throat, headache, ear pain, eyes watering and sneezing, and fatigue.  She denies chills, abdominal pain, nausea/vomiting, and diarrhea.  Reports she has been around multiple coworkers last week who had influenza.  Has taken ibuprofen, Tylenol, Mucinex D, and Claritin for symptoms with mild temporary improvement.    Past Medical History:  Diagnosis Date   Abscess    right buttocks/thigh   Anxiety    Depression    DUB (dysfunctional uterine bleeding) 05/25/2012   Hydradenitis    Hypothyroidism    Polycystic ovaries    Thyroid disease    hypothyroidism    Patient Active Problem List   Diagnosis Date Noted   Menorrhagia with regular cycle 02/10/2023   Smoker 11/25/2022   Encounter for surveillance of vaginal ring hormonal contraceptive device 11/25/2022   Encounter for routine gynecological examination with Papanicolaou smear of cervix 11/25/2022   Left axillary hidradenitis    Anovulatory (dysfunctional uterine) bleeding 06/07/2012   DUB (dysfunctional uterine bleeding) 05/25/2012   FOOT PAIN, CHRONIC 05/09/2007    Past Surgical History:  Procedure Laterality Date   ADENOIDECTOMY     BALLOON DILATION N/A 12/19/2019   Procedure: BALLOON DILATION;  Surgeon: Lanelle Bal, DO;  Location: [facility];  Service: Endoscopy;  Laterality: N/A;   BIOPSY  12/19/2019   Procedure: BIOPSY;  Surgeon: Lanelle Bal, DO;  Location: [facility];  Service: Endoscopy;;   CHOLECYSTECTOMY  01/27/2012   Procedure: LAPAROSCOPIC CHOLECYSTECTOMY;  Surgeon:  Fabio Bering, MD;  Location: [facility];  Service: General;  Laterality: N/A;   DILITATION & CURRETTAGE/HYSTROSCOPY WITH HYDROTHERMAL ABLATION N/A 02/10/2023   Procedure: DILATATION & CURETTAGE/HYSTEROSCOPY WITH HYDROTHERMAL ABLATION;  Surgeon: Myna Hidalgo, DO;  Location: [facility];  Service: Gynecology;  Laterality: N/A;   ESOPHAGOGASTRODUODENOSCOPY (EGD) WITH PROPOFOL N/A 12/19/2019   Procedure: ESOPHAGOGASTRODUODENOSCOPY (EGD) WITH PROPOFOL;  Surgeon: Lanelle Bal, DO;  Location: [facility];  Service: Endoscopy;  Laterality: N/A;  3:00pm   HERNIA REPAIR Left    LIH   HYDRADENITIS EXCISION  10/02/2011   Procedure: EXCISION HYDRADENITIS AXILLA;  Surgeon: Fabio Bering, MD;  Location: [facility];  Service: General;  Laterality: Right;  Right Axillary Dissection   HYDRADENITIS EXCISION Left 10/19/2013   Procedure: INCISION OF HIDRADENITIS SUPPURATIVA LEFT AXILLA;  Surgeon: Marlane Hatcher, MD;  Location: [facility];  Service: General;  Laterality: Left;   HYDRADENITIS EXCISION Left 09/09/2018   Procedure: EXCISION HIDRADENITIS AXILLA, LEFT;  Surgeon: Franky Macho, MD;  Location: [facility];  Service: General;  Laterality: Left;   SHOULDER SURGERY  08/2018   TONSILLECTOMY      OB History     Gravida  0   Para      Term      Preterm      AB      Living  0      SAB      IAB      Ectopic      Multiple  Live Births               Home Medications    Prior to Admission medications   Medication Sig Start Date End Date Taking? Authorizing Provider  benzonatate (TESSALON) 100 MG capsule Take 1 capsule (100 mg total) by mouth 3 (three) times daily as needed for cough. Do not take with alcohol or while operating or driving heavy machinery 04/17/38  Yes Valentino Nose, NP  oseltamivir (TAMIFLU) 75 MG capsule Take 1 capsule (75 mg total) by mouth every 12 (twelve) hours for 5 days. 04/20/23 04/25/23 Yes Valentino Nose, NP  amphetamine-dextroamphetamine (ADDERALL) 20  MG tablet Take 1 tablet (20 mg total) by mouth 2 (two) times daily. 09/10/22 09/10/23  Myrlene Broker, MD  amphetamine-dextroamphetamine (ADDERALL) 20 MG tablet Take 1 tablet (20 mg total) by mouth 2 (two) times daily. Patient not taking: Reported on 12/16/2022 09/10/22 09/10/23  Myrlene Broker, MD  amphetamine-dextroamphetamine (ADDERALL) 20 MG tablet Take 1 tablet (20 mg total) by mouth 2 (two) times daily. Patient not taking: Reported on 12/16/2022 09/10/22 09/10/23  Myrlene Broker, MD  cetirizine (ZYRTEC ALLERGY) 10 MG tablet Take 1 tablet (10 mg total) by mouth daily. 08/31/19   Avegno, Zachery Dakins, FNP  clonazePAM (KLONOPIN) 1 MG tablet Take 1 tablet (1 mg total) by mouth daily as needed for anxiety. Patient taking differently: Take 1 mg by mouth 2 (two) times daily as needed for anxiety. 09/10/22   Myrlene Broker, MD  ibuprofen (ADVIL) 200 MG tablet Take 600 mg by mouth every 6 (six) hours as needed for moderate pain (pain score 4-6).    [provider]  tirzepatide (ZEPBOUND) 15 MG/0.5ML Pen Inject 15 mg into the skin every Tuesday.    [provider]    Family History Family History  Problem Relation Age of Onset   Diabetes Paternal Grandfather    Aneurysm Paternal Grandfather    Anxiety disorder Paternal Grandfather    Diabetes Paternal Grandmother    Diabetes Maternal Grandmother    Hypertension Maternal Grandmother    Heart disease Maternal Grandmother        CHF   COPD Maternal Grandmother    Cirrhosis Maternal Grandmother    Anxiety disorder Maternal Grandmother    Cancer Maternal Grandfather        lung   Drug abuse Father    Diabetes Mother    Hypertension Mother    Hyperlipidemia Mother    Neuropathy Mother    Heart failure Mother    ADD / ADHD Brother    Anxiety disorder Maternal Aunt    Anxiety disorder Maternal Uncle    Anxiety disorder Paternal Uncle    Depression Paternal Uncle    Stroke Other     Social History Social History   Tobacco  Use   Smoking status: Every Day    Current packs/day: 0.50    Average packs/day: 0.5 packs/day for 7.0 years (3.5 ttl pk-yrs)    Types: Cigarettes   Smokeless tobacco: Never  Vaping Use   Vaping status: Never Used  Substance Use Topics   Alcohol use: Not Currently    Comment: social use    Drug use: No     Allergies   Bactrim, Ciprofloxacin, Other, Penicillins, Percocet [oxycodone-acetaminophen], and Vancomycin   Review of Systems Review of Systems Per HPI  Physical Exam Triage Vital Signs ED Triage Vitals  Encounter Vitals Group     BP 04/20/23 1801 129/85  Systolic BP Percentile --      Diastolic BP Percentile --      Pulse Rate 04/20/23 1801 74     Resp 04/20/23 1801 18     Temp 04/20/23 1801 98 F (36.7 C)     Temp Source 04/20/23 1801 Oral     SpO2 04/20/23 1801 98 %     Weight --      Height --      Head Circumference --      Peak Flow --      Pain Score 04/20/23 1803 4     Pain Loc --      Pain Education --      Exclude from Growth Chart --    No data found.  Updated Vital Signs BP 129/85 (BP Location: Right Arm)   Pulse 74   Temp 98 F (36.7 C) (Oral)   Resp 18   SpO2 98%   Visual Acuity Right Eye Distance:   Left Eye Distance:   Bilateral Distance:    Right Eye Near:   Left Eye Near:    Bilateral Near:     Physical Exam Vitals and nursing note reviewed.  Constitutional:      General: She is not in acute distress.    Appearance: Normal appearance. She is not ill-appearing or toxic-appearing.  HENT:     Head: Normocephalic and atraumatic.     Right Ear: Ear canal and external ear normal. A middle ear effusion is present.     Left Ear: Tympanic membrane, ear canal and external ear normal.     Nose: Congestion present. No rhinorrhea.     Mouth/Throat:     Mouth: Mucous membranes are moist.     Pharynx: Oropharynx is clear. Postnasal drip present. No oropharyngeal exudate or posterior oropharyngeal erythema.  Eyes:     General: No  scleral icterus.    Extraocular Movements: Extraocular movements intact.  Cardiovascular:     Rate and Rhythm: Normal rate and regular rhythm.  Pulmonary:     Effort: Pulmonary effort is normal. No respiratory distress.     Breath sounds: Normal breath sounds. No wheezing, rhonchi or rales.  Musculoskeletal:     Cervical back: Normal range of motion and neck supple.  Lymphadenopathy:     Cervical: No cervical adenopathy.  Skin:    General: Skin is warm and dry.     Coloration: Skin is not jaundiced or pale.     Findings: No erythema or rash.  Neurological:     Mental Status: She is alert and oriented to person, place, and time.  Psychiatric:        Behavior: Behavior is cooperative.      UC Treatments / Results  Labs (all labs ordered are listed, but only abnormal results are displayed) Labs Reviewed  POC COVID19/FLU A&B COMBO - Abnormal; Notable for the following components:      Result Value   Influenza B Antigen, POC Positive (*)    All other components within normal limits    EKG   Radiology No results found.  Procedures Procedures (including critical care time)  Medications Ordered in UC Medications - No data to display  Initial Impression / Assessment and Plan / UC Course  I have reviewed the triage vital signs and the nursing notes.  Pertinent labs & imaging results that were available during my care of the patient were reviewed by me and considered in my medical decision making (see chart for details).  Patient is well-appearing, normotensive, afebrile, not tachycardic, not tachypneic, oxygenating well on room air.    1. Influenza B Vitals and exam are reassuring today Start Tamiflu twice daily for 5 days Other supportive care discussed with patient including cough suppressant medication ER and return precautions also discussed Work excuse provided  The patient was given the opportunity to ask questions.  All questions answered to their  satisfaction.  The patient is in agreement to this plan.   Final Clinical Impressions(s) / UC Diagnoses   Final diagnoses:  Influenza B     Discharge Instructions      You tested positive for influenza B.  Take Tamiflu as prescribed to treat it.  Symptoms should improve over the next week to 10 days.  If you develop chest pain or shortness of breath, go to the emergency room.  Some things that can make you feel better are: - Increased rest - Increasing fluid with water/sugar free electrolytes - Acetaminophen and ibuprofen as needed for fever/pain - Salt water gargling, chloraseptic spray and throat lozenges - OTC guaifenesin (Mucinex) 600 mg twice daily for congestion - Saline sinus flushes or a neti pot - Humidifying the air -Tessalon Perles every 8 hours as needed for dry cough      ED Prescriptions     Medication Sig Dispense Auth. Provider   oseltamivir (TAMIFLU) 75 MG capsule Take 1 capsule (75 mg total) by mouth every 12 (twelve) hours for 5 days. 10 capsule Cathlean Marseilles A, NP   benzonatate (TESSALON) 100 MG capsule Take 1 capsule (100 mg total) by mouth 3 (three) times daily as needed for cough. Do not take with alcohol or while operating or driving heavy machinery 21 capsule Valentino Nose, NP      PDMP not reviewed this encounter.   Valentino Nose, NP 04/21/23 [phone]

## 2023-04-20 NOTE — Discharge Instructions (Signed)
 You tested positive for influenza B.  Take Tamiflu as prescribed to treat it.  Symptoms should improve over the next week to 10 days.  If you develop chest pain or shortness of breath, go to the emergency room.  Some things that can make you feel better are: - Increased rest - Increasing fluid with water/sugar free electrolytes - Acetaminophen and ibuprofen as needed for fever/pain - Salt water gargling, chloraseptic spray and throat lozenges - OTC guaifenesin (Mucinex) 600 mg twice daily for congestion - Saline sinus flushes or a neti pot - Humidifying the air -Tessalon Perles every 8 hours as needed for dry cough

## 2023-04-20 NOTE — ED Triage Notes (Signed)
 Nasal congestion, cough, since yesterday.  States ears feels like they have fluid in them, Headache and body aches with minimal sore throat.

## 2023-05-11 DIAGNOSIS — M5412 Radiculopathy, cervical region: Secondary | ICD-10-CM | POA: Diagnosis not present

## 2023-05-11 DIAGNOSIS — Z6826 Body mass index (BMI) 26.0-26.9, adult: Secondary | ICD-10-CM | POA: Diagnosis not present

## 2023-05-11 DIAGNOSIS — G932 Benign intracranial hypertension: Secondary | ICD-10-CM | POA: Diagnosis not present

## 2023-05-11 DIAGNOSIS — E663 Overweight: Secondary | ICD-10-CM | POA: Diagnosis not present

## 2023-11-12 DIAGNOSIS — R0981 Nasal congestion: Secondary | ICD-10-CM | POA: Diagnosis not present

## 2023-11-12 DIAGNOSIS — B349 Viral infection, unspecified: Secondary | ICD-10-CM | POA: Diagnosis not present

## 2023-12-14 ENCOUNTER — Ambulatory Visit: Admitting: Physician Assistant

## 2023-12-14 ENCOUNTER — Other Ambulatory Visit (HOSPITAL_COMMUNITY): Payer: Self-pay

## 2023-12-14 ENCOUNTER — Encounter: Payer: Self-pay | Admitting: Physician Assistant

## 2023-12-14 ENCOUNTER — Encounter: Payer: Self-pay | Admitting: Pharmacy Technician

## 2023-12-14 ENCOUNTER — Ambulatory Visit (HOSPITAL_COMMUNITY)
Admission: RE | Admit: 2023-12-14 | Discharge: 2023-12-14 | Disposition: A | Source: Ambulatory Visit | Attending: Physician Assistant

## 2023-12-14 VITALS — BP 137/91 | HR 73 | Temp 98.2°F | Ht 64.0 in | Wt 165.5 lb

## 2023-12-14 DIAGNOSIS — Z7689 Persons encountering health services in other specified circumstances: Secondary | ICD-10-CM

## 2023-12-14 DIAGNOSIS — M7701 Medial epicondylitis, right elbow: Secondary | ICD-10-CM | POA: Insufficient documentation

## 2023-12-14 DIAGNOSIS — M4726 Other spondylosis with radiculopathy, lumbar region: Secondary | ICD-10-CM | POA: Diagnosis not present

## 2023-12-14 DIAGNOSIS — M5416 Radiculopathy, lumbar region: Secondary | ICD-10-CM | POA: Insufficient documentation

## 2023-12-14 DIAGNOSIS — F339 Major depressive disorder, recurrent, unspecified: Secondary | ICD-10-CM | POA: Insufficient documentation

## 2023-12-14 MED ORDER — ZEPBOUND 15 MG/0.5ML ~~LOC~~ SOAJ: 15.0000 mg | SUBCUTANEOUS | 1 refills | Status: AC

## 2023-12-14 NOTE — Assessment & Plan Note (Signed)
 Chronic lumbar radiculopathy with sciatica causing significant functional impairment. Ibuprofen  provides some relief. Prednisone  avoided unless severe flare occurs. Has never had a low back XR. No acute neurological findings on exam. Gait intact.  - Lumbar XR today. - Referred to physical therapy for back pain management. - Advised ibuprofen  as needed, not exceeding 800 mg twice daily, with food and water. - Follow up in 4-6 weeks. Will consider MRI and steroid injections if symptoms persist after 6-8 weeks of physical therapy.

## 2023-12-14 NOTE — Telephone Encounter (Signed)
 ERROR

## 2023-12-14 NOTE — Assessment & Plan Note (Signed)
 New onset medial epicondylitis in the right elbow with pain during active pushing and pulling, exacerbated by lifting over 10 pounds. - Supportive care discussed, ibuprofen  and tylenol  as needed, rest, ice and heat as needed.  - Provided exercises for medial epicondylitis. - Offered a doctor's note for light duty at work to avoid heavy lifting. - Advised rest and monitoring of symptoms over the next 2-3 weeks. - Follow up in 4-6 weeks if symptoms do not improve.

## 2023-12-14 NOTE — Progress Notes (Signed)
 New Patient Office Visit  Subjective    Patient ID: Kathleen Velasquez, female    DOB: 10/28/1990  Age: 34 y.o. MRN: [id number]  CC:  Chief Complaint  Patient presents with   New Patient (Initial Visit)    Patient is a new patient. She is wanting to have her zepbound refill for her. She mentioned her insurance pays for it for weight loss.  Before doctor retired, she had 2 rounds of prednisone  for lower back pain and wants to discuss what are the next steps she can do with that.  She wants to talk about her right elbow having a shooting sharp pain when heavy lifting is being involved    HPI Kathleen Velasquez presents to establish care  Discussed the use of AI scribe software for clinical note transcription with the patient, who gave verbal consent to proceed.  History of Present Illness Kathleen Velasquez is a 33 year old female who presents to establish care and management of sciatica and medication refills.  She has chronic lumbar radiculitis with sciatica. Pain starts above the buttocks and radiates down the left leg to the foot. Flares can be severe enough to cause knee buckling and inability to get off the couch. She takes ibuprofen  800 mg once to twice daily depending on workload, which reduces pain. She previously received two courses of prednisone  for this during flares.  She developed new medial elbow pain about three weeks ago, triggered by pushing, pulling, or lifting more than 10 pounds. Ibuprofen  does not relieve this pain.  She has lost about 110-117 pounds over nearly three years, initially on Wegovy in 2023 and on Zepbound since February 2024. She notes mild fatigue with Zepbound but no significant nausea, vomiting, or constipation.  She smokes about half a pack of cigarettes daily, down from one and a half packs, and has smoked for about 14-15 years. She does not use alcohol or recreational drugs.   Outpatient Encounter Medications as of 12/14/2023  Medication Sig    amphetamine -dextroamphetamine  (ADDERALL) 20 MG tablet Take 1 tablet (20 mg total) by mouth 2 (two) times daily.   clonazePAM  (KLONOPIN ) 1 MG tablet Take 1 tablet (1 mg total) by mouth daily as needed for anxiety.   ibuprofen  (ADVIL ) 200 MG tablet Take 600 mg by mouth every 6 (six) hours as needed for moderate pain (pain score 4-6).   [DISCONTINUED] tirzepatide (ZEPBOUND) 15 MG/0.5ML Pen Inject 15 mg into the skin every Tuesday.   tirzepatide (ZEPBOUND) 15 MG/0.5ML Pen Inject 15 mg into the skin every Tuesday.   [DISCONTINUED] amphetamine -dextroamphetamine  (ADDERALL) 20 MG tablet Take 1 tablet (20 mg total) by mouth 2 (two) times daily. (Patient not taking: Reported on 12/16/2022)   [DISCONTINUED] amphetamine -dextroamphetamine  (ADDERALL) 20 MG tablet Take 1 tablet (20 mg total) by mouth 2 (two) times daily. (Patient not taking: Reported on 12/16/2022)   [DISCONTINUED] benzonatate  (TESSALON ) 100 MG capsule Take 1 capsule (100 mg total) by mouth 3 (three) times daily as needed for cough. Do not take with alcohol or while operating or driving heavy machinery (Patient not taking: Reported on 12/14/2023)   [DISCONTINUED] cetirizine  (ZYRTEC  ALLERGY) 10 MG tablet Take 1 tablet (10 mg total) by mouth daily. (Patient not taking: Reported on 12/14/2023)   No facility-administered encounter medications on file as of 12/14/2023.    Past Medical History:  Diagnosis Date   Abscess    right buttocks/thigh   Anxiety    Depression    DUB (dysfunctional uterine bleeding)  05/25/2012   Hydradenitis    Hypothyroidism    Polycystic ovaries    Thyroid  disease    hypothyroidism    Past Surgical History:  Procedure Laterality Date   ADENOIDECTOMY     BALLOON DILATION N/A 12/19/2019   Procedure: BALLOON DILATION;  Surgeon: Cindie Carlin POUR, DO;  Location: [facility];  Service: Endoscopy;  Laterality: N/A;   BIOPSY  12/19/2019   Procedure: BIOPSY;  Surgeon: Cindie Carlin POUR, DO;  Location: [facility];   Service: Endoscopy;;   CHOLECYSTECTOMY  01/27/2012   Procedure: LAPAROSCOPIC CHOLECYSTECTOMY;  Surgeon: Thresa JAYSON Pulling, MD;  Location: [facility];  Service: General;  Laterality: N/A;   DILITATION & CURRETTAGE/HYSTROSCOPY WITH HYDROTHERMAL ABLATION N/A 02/10/2023   Procedure: DILATATION & CURETTAGE/HYSTEROSCOPY WITH HYDROTHERMAL ABLATION;  Surgeon: Ozan, Jennifer, DO;  Location: [facility];  Service: Gynecology;  Laterality: N/A;   ESOPHAGOGASTRODUODENOSCOPY (EGD) WITH PROPOFOL  N/A 12/19/2019   Procedure: ESOPHAGOGASTRODUODENOSCOPY (EGD) WITH PROPOFOL ;  Surgeon: Cindie Carlin POUR, DO;  Location: [facility];  Service: Endoscopy;  Laterality: N/A;  3:00pm   HERNIA REPAIR Left    LIH   HYDRADENITIS EXCISION  10/02/2011   Procedure: EXCISION HYDRADENITIS AXILLA;  Surgeon: Thresa JAYSON Pulling, MD;  Location: [facility];  Service: General;  Laterality: Right;  Right Axillary Dissection   HYDRADENITIS EXCISION Left 10/19/2013   Procedure: INCISION OF HIDRADENITIS SUPPURATIVA LEFT AXILLA;  Surgeon: Elsie GORMAN Holland, MD;  Location: [facility];  Service: General;  Laterality: Left;   HYDRADENITIS EXCISION Left 09/09/2018   Procedure: EXCISION HIDRADENITIS AXILLA, LEFT;  Surgeon: Mavis Anes, MD;  Location: [facility];  Service: General;  Laterality: Left;   SHOULDER SURGERY  08/2018   TONSILLECTOMY      Family History  Problem Relation Age of Onset   Diabetes Paternal Grandfather    Aneurysm Paternal Grandfather    Anxiety disorder Paternal Grandfather    Diabetes Paternal Grandmother    Diabetes Maternal Grandmother    Hypertension Maternal Grandmother    Heart disease Maternal Grandmother        CHF   COPD Maternal Grandmother    Cirrhosis Maternal Grandmother    Anxiety disorder Maternal Grandmother    Cancer Maternal Grandfather        lung   Drug abuse Father    Diabetes Mother    Hypertension Mother    Hyperlipidemia Mother    Neuropathy Mother    Heart failure Mother    ADD / ADHD Brother     Anxiety disorder Maternal Aunt    Anxiety disorder Maternal Uncle    Anxiety disorder Paternal Uncle    Depression Paternal Uncle    Stroke Other     Social History   Socioeconomic History   Marital status: Single    Spouse name: Not on file   Number of children: Not on file   Years of education: Not on file   Highest education level: Not on file  Occupational History   Not on file  Tobacco Use   Smoking status: Every Day    Current packs/day: 0.50    Average packs/day: 0.5 packs/day for 7.0 years (3.5 ttl pk-yrs)    Types: Cigarettes   Smokeless tobacco: Never  Vaping Use   Vaping status: Never Used  Substance and Sexual Activity   Alcohol use: Not Currently    Comment: social use    Drug use: No   Sexual activity: Not Currently    Birth control/protection: Inserts    Comment:  Nuvaring  Other Topics Concern   Not on file  Social History Narrative   Not on file   Social Drivers of Health   Financial Resource Strain: Low Risk  (11/25/2022)   Overall Financial Resource Strain (CARDIA)    Difficulty of Paying Living Expenses: Not hard at all  Food Insecurity: No Food Insecurity (11/25/2022)   Hunger Vital Sign    Worried About Running Out of Food in the Last Year: Never true    Ran Out of Food in the Last Year: Never true  Transportation Needs: No Transportation Needs (11/25/2022)   PRAPARE - Administrator, Civil Service (Medical): No    Lack of Transportation (Non-Medical): No  Physical Activity: Sufficiently Active (11/25/2022)   Exercise Vital Sign    Days of Exercise per Week: 5 days    Minutes of Exercise per Session: 30 min  Stress: No Stress Concern Present (11/25/2022)   [employer] of Occupational Health - Occupational Stress Questionnaire    Feeling of Stress : Not at all  Social Connections: Moderately Isolated (11/25/2022)   Social Connection and Isolation Panel    Frequency of Communication with Friends and Family: More than three  times a week    Frequency of Social Gatherings with Friends and Family: Twice a week    Attends Religious Services: More than 4 times per year    Active Member of [employer] or Organizations: No    Attends Banker Meetings: Never    Marital Status: Never married  Intimate Partner Violence: Not At Risk (11/25/2022)   Humiliation, Afraid, Rape, and Kick questionnaire    Fear of Current or Ex-Partner: No    Emotionally Abused: No    Physically Abused: No    Sexually Abused: No    Review of Systems  Constitutional:  Negative for chills, fever and malaise/fatigue.  Respiratory:  Negative for cough and shortness of breath.   Cardiovascular:  Negative for chest pain and palpitations.  Musculoskeletal:  Positive for back pain and joint pain. Negative for myalgias.  Neurological:  Negative for dizziness and headaches.      Objective    BP (!) 137/91 (BP Location: Left Arm, Patient Position: Sitting)   Pulse 73   Temp 98.2 F (36.8 C)   Ht 5' 4 (1.626 m)   Wt 165 lb 8 oz (75.1 kg)   SpO2 100%   BMI 28.41 kg/m   Physical Exam Constitutional:      General: She is not in acute distress.    Appearance: Normal appearance. She is not ill-appearing.  HENT:     Head: Normocephalic and atraumatic.     Mouth/Throat:     Mouth: Mucous membranes are moist.     Pharynx: Oropharynx is clear.  Eyes:     Extraocular Movements: Extraocular movements intact.     Conjunctiva/sclera: Conjunctivae normal.  Cardiovascular:     Rate and Rhythm: Normal rate and regular rhythm.     Heart sounds: Normal heart sounds. No murmur heard. Pulmonary:     Effort: Pulmonary effort is normal.     Breath sounds: Normal breath sounds.  Musculoskeletal:     Right elbow: No swelling or effusion. Normal range of motion. No tenderness.     Right lower leg: No edema.     Left lower leg: No edema.     Comments: Medial right elbow pain with flexion and supination against resistance.  Skin:    General:  Skin is warm and  dry.  Neurological:     General: No focal deficit present.     Mental Status: She is alert and oriented to person, place, and time.  Psychiatric:        Mood and Affect: Mood normal.        Behavior: Behavior normal.       Assessment & Plan:  Encounter to establish care  Medial epicondylitis of right elbow Assessment & Plan: New onset medial epicondylitis in the right elbow with pain during active pushing and pulling, exacerbated by lifting over 10 pounds. - Supportive care discussed, ibuprofen  and tylenol  as needed, rest, ice and heat as needed.  - Provided exercises for medial epicondylitis. - Offered a doctor's note for light duty at work to avoid heavy lifting. - Advised rest and monitoring of symptoms over the next 2-3 weeks. - Follow up in 4-6 weeks if symptoms do not improve.    Lumbar radiculitis Assessment & Plan: Chronic lumbar radiculopathy with sciatica causing significant functional impairment. Ibuprofen  provides some relief. Prednisone  avoided unless severe flare occurs. Has never had a low back XR. No acute neurological findings on exam. Gait intact.  - Lumbar XR today. - Referred to physical therapy for back pain management. - Advised ibuprofen  as needed, not exceeding 800 mg twice daily, with food and water. - Follow up in 4-6 weeks. Will consider MRI and steroid injections if symptoms persist after 6-8 weeks of physical therapy.  Orders: -     DG Lumbar Spine 2-3 Views -     Ambulatory referral to Physical Therapy  Other orders -     Zepbound; Inject 15 mg into the skin every Tuesday.  Dispense: 6 mL; Refill: 1    Return in about 6 weeks (around 01/25/2024) for elbow/back .   Charmaine Kelii Chittum, PA-C

## 2023-12-15 ENCOUNTER — Ambulatory Visit: Payer: Self-pay | Admitting: Physician Assistant

## 2023-12-21 ENCOUNTER — Telehealth: Payer: Self-pay | Admitting: Pharmacy Technician

## 2023-12-21 ENCOUNTER — Other Ambulatory Visit (HOSPITAL_COMMUNITY): Payer: Self-pay

## 2023-12-21 NOTE — Telephone Encounter (Signed)
 Pharmacy Patient Advocate Encounter  Received notification from [employer] that Prior Authorization for Zepbound  15MG /0.5ML pen-injectors has been APPROVED from 12/21/2023 to 06/18/2024. Ran test claim, Copay is $0.00. This test claim was processed through [facility]- copay amounts may vary at other pharmacies due to pharmacy/plan contracts, or as the patient moves through the different stages of their insurance plan.   PA #/Case ID/Reference #: [id number]

## 2023-12-22 ENCOUNTER — Other Ambulatory Visit (HOSPITAL_COMMUNITY): Payer: Self-pay

## 2023-12-28 NOTE — Therapy (Signed)
 OUTPATIENT PHYSICAL THERAPY THORACOLUMBAR EVALUATION   Patient Name: Kathleen Velasquez MRN: [id number] DOB:1990-01-18, 33 y.o., female Today's Date: 12/30/2023  END OF SESSION:  PT End of Session - 12/30/23 0855     Visit Number 1    Number of Visits 8    Date for Recertification  01/27/24    Authorization Type [employer]    Authorization Time Period please check auth    PT Start Time 9142    PT Stop Time 0940    PT Time Calculation (min) 43 min    Activity Tolerance Patient tolerated treatment well    Behavior During Therapy [facility] for tasks assessed/performed          Past Medical History:  Diagnosis Date   Abscess    right buttocks/thigh   Anxiety    Depression    DUB (dysfunctional uterine bleeding) 05/25/2012   Hydradenitis    Hypothyroidism    Polycystic ovaries    Thyroid  disease    hypothyroidism   Past Surgical History:  Procedure Laterality Date   ADENOIDECTOMY     BALLOON DILATION N/A 12/19/2019   Procedure: BALLOON DILATION;  Surgeon: Cindie Carlin POUR, DO;  [facility];  Service: Endoscopy;  Laterality: N/A;   BIOPSY  12/19/2019   Procedure: BIOPSY;  Surgeon: Cindie Carlin POUR, DO;  [facility];  Service: Endoscopy;;   CHOLECYSTECTOMY  01/27/2012   Procedure: LAPAROSCOPIC CHOLECYSTECTOMY;  Surgeon: Thresa JAYSON Pulling, MD;  Location: [facility];  Service: General;  Laterality: N/A;   DILITATION & CURRETTAGE/HYSTROSCOPY WITH HYDROTHERMAL ABLATION N/A 02/10/2023   Procedure: DILATATION & CURETTAGE/HYSTEROSCOPY WITH HYDROTHERMAL ABLATION;  Surgeon: Ozan, Jennifer, DO;  Location: [facility];  Service: Gynecology;  Laterality: N/A;   ESOPHAGOGASTRODUODENOSCOPY (EGD) WITH PROPOFOL  N/A 12/19/2019   Procedure: ESOPHAGOGASTRODUODENOSCOPY (EGD) WITH PROPOFOL ;  Surgeon: Cindie Carlin POUR, DO;  [facility];  Service: Endoscopy;  Laterality: N/A;  3:00pm   HERNIA REPAIR Left    LIH   HYDRADENITIS EXCISION  10/02/2011   Procedure: EXCISION HYDRADENITIS  AXILLA;  Surgeon: Thresa JAYSON Pulling, MD;  Location: [facility];  Service: General;  Laterality: Right;  Right Axillary Dissection   HYDRADENITIS EXCISION Left 10/19/2013   Procedure: INCISION OF HIDRADENITIS SUPPURATIVA LEFT AXILLA;  Surgeon: Elsie GORMAN Holland, MD;  Location: [facility];  Service: General;  Laterality: Left;   HYDRADENITIS EXCISION Left 09/09/2018   Procedure: EXCISION HIDRADENITIS AXILLA, LEFT;  Surgeon: Mavis Anes, MD;  Location: [facility];  Service: General;  Laterality: Left;   SHOULDER SURGERY  08/2018   TONSILLECTOMY     Patient Active Problem List   Diagnosis Date Noted   Lumbar radiculitis 12/14/2023   Depression, recurrent 12/14/2023   Medial epicondylitis of right elbow 12/14/2023   Menorrhagia with regular cycle 02/10/2023   Hidradenitis suppurativa     PCP: Grooms, [place], PA-C  REFERRING PROVIDER: Grooms, [place], PA-C  REFERRING DIAG: M54.16 (ICD-10-CM) - Lumbar radiculitis  Rationale for Evaluation and Treatment: Rehabilitation  THERAPY DIAG:  Lumbar radiculitis - Plan: PT plan of care cert/re-cert  Low back pain, unspecified back pain laterality, unspecified chronicity, unspecified whether sciatica present - Plan: PT plan of care cert/re-cert  Difficulty in walking, not elsewhere classified - Plan: PT plan of care cert/re-cert  ONSET DATE: about a year  SUBJECTIVE:  SUBJECTIVE STATEMENT: Pain in back that goes down left leg for about a year; Her MD Dr. Bertell; treated with prednisone  and ibuprofen .  Fusco retired and saw a new MD who wanted to try PT; x-ray showed mild degenerative changes.  Sometimes hurts in the mornings in the back and when she works it runs down her legs.  Does some lifting up to 50# and stands on cement flooring.  Pain will go from back down left leg;  mostly just to knee but sometimes into the foot.    PERTINENT HISTORY:  depression  PAIN:  Are you having pain? Yes: NPRS scale: 5/10 low back; 2/10 at best and 10/10 at worst Pain location: low back and left leg Pain description: stiff; shooting and stabbing Aggravating factors: work Relieving factors: rest  PRECAUTIONS: None  RED FLAGS: None   WEIGHT BEARING RESTRICTIONS: No  FALLS:  Has patient fallen in last 6 months? No  OCCUPATION: works at [employer]  PLOF: Independent  PATIENT GOALS: no pain in back and leg  NEXT MD VISIT: Jan 15th, 2026  OBJECTIVE:  Note: Objective measures were completed at Evaluation unless otherwise noted.  DIAGNOSTIC FINDINGS:  CLINICAL DATA:  Low back pain radiating down the left leg for the past year. No known injury.   EXAM: LUMBAR SPINE - 2-3 VIEW   COMPARISON:  Abdomen and pelvis CT dated 04/08/2021.   FINDINGS: Five non-rib-bearing lumbar vertebrae. Minimal anterior spur formation at the L3-4 level. No fractures, pars defects or subluxations. Cholecystectomy clips.   IMPRESSION: Minimal degenerative changes at the L3-4 level.    PATIENT SURVEYS:  Modified Oswestry:  MODIFIED OSWESTRY DISABILITY SCALE  Date: 12/30/2023 Score                                Total 15/50; 30%   Interpretation of scores: Score Category Description  0-20% Minimal Disability The patient can cope with most living activities. Usually no treatment is indicated apart from advice on lifting, sitting and exercise  21-40% Moderate Disability The patient experiences more pain and difficulty with sitting, lifting and standing. Travel and social life are more difficult and they may be disabled from work. Personal care, sexual activity and sleeping are not grossly affected, and the patient can usually be managed by conservative means  41-60% Severe Disability Pain remains the main problem in this group, but activities of daily living are  affected. These patients require a detailed investigation  61-80% Crippled Back pain impinges on all aspects of the patients life. Positive intervention is required  81-100% Bed-bound These patients are either bed-bound or exaggerating their symptoms  Bluford FORBES Zoe DELENA Karon DELENA, et al. Surgery versus conservative management of stable thoracolumbar fracture: the PRESTO feasibility RCT. [place] ([place]): [employer]; 2021 Nov. [facility] Technology Assessment, No. 25.62.) Appendix 3, Oswestry Disability Index category descriptors. Available from: Findjewelers.cz  Minimally Clinically Important Difference (MCID) = 12.8%  COGNITION: Overall cognitive status: Within functional limits for tasks assessed     SENSATION: WFL Had numbness only  once  MUSCLE LENGTH: Hamstrings:   POSTURE: rounded shoulders, forward head, and decreased lumbar lordosis  PALPATION: Tenderness in low back more left side than right  LUMBAR ROM: prone lying increased pain down left leg  AROM eval  Flexion Fingers to ankles  Extension 30% available Low back pain  Right lateral flexion   Left lateral flexion   Right rotation   Left  rotation    (Blank rows = not tested)  LOWER EXTREMITY ROM:     Active  Right eval Left eval  Hip flexion    Hip extension    Hip abduction    Hip adduction    Hip internal rotation    Hip external rotation    Knee flexion    Knee extension    Ankle dorsiflexion    Ankle plantarflexion    Ankle inversion    Ankle eversion     (Blank rows = not tested)  LOWER EXTREMITY MMT:    MMT Right eval Left eval  Hip flexion 5 4+  Hip extension 4+ 4-  Hip abduction    Hip adduction    Hip internal rotation    Hip external rotation    Knee flexion 4+ 4  Knee extension 5 5  Ankle dorsiflexion 5 5  Ankle plantarflexion    Ankle inversion    Ankle eversion     (Blank rows = not tested)  FUNCTIONAL TESTS:  5 times sit to stand:  15.89 sec no UE assist  GAIT: Distance walked: 50 ft in clinic Assistive device utilized: None Level of assistance: Modified independence Comments: slight decreased stance left lower extremity  TREATMENT DATE: 12/30/2023 physical therapy evaluation and HEP instruction                                                                                                                                 PATIENT EDUCATION:  Education details: Patient educated on exam findings, POC, scope of PT, HEP, and what to expect next visit. Person educated: Patient Education method: Explanation, Demonstration, and Handouts Education comprehension: verbalized understanding, returned demonstration, verbal cues required, and tactile cues required   HOME EXERCISE PROGRAM: Access Code: [email or web address] URL: [email or web address] Date: 12/30/2023 Prepared by: [facility]  Exercises - Lying Prone with 1 Pillow  - 1 x daily - 7 x weekly - 1 reps - 5 min hold - Hooklying Single Knee to Chest Stretch  - 1 x daily - 7 x weekly - 1 sets - 10 reps - 5 hold - Supine Sciatic Nerve Glide  - 1 x daily - 7 x weekly - 1 sets - 10 reps - Supine Figure 4 Piriformis Stretch  - 1 x daily - 7 x weekly - 1 sets - 10 reps - 20 sec hold  ASSESSMENT:  CLINICAL IMPRESSION: Patient is a 33 y.o. female who was seen today for physical therapy evaluation and treatment for M54.16 (ICD-10-CM) - Lumbar radiculitis.  Patient demonstrates muscle weakness, reduced ROM, and fascial restrictions which are likely contributing to symptoms of pain and are negatively impacting patient ability to perform ADLs and functional mobility tasks. Patient will benefit from skilled physical therapy services to address these deficits to reduce pain and improve level of function with ADLs and functional mobility tasks.   OBJECTIVE IMPAIRMENTS: Abnormal gait, decreased activity tolerance, decreased mobility, decreased  strength, increased fascial  restrictions, impaired perceived functional ability, postural dysfunction, and pain.   ACTIVITY LIMITATIONS: carrying, lifting, bending, standing, and locomotion level  PARTICIPATION LIMITATIONS: meal prep, cleaning, laundry, and occupation  REHAB POTENTIAL: Good  CLINICAL DECISION MAKING: Evolving/moderate complexity  EVALUATION COMPLEXITY: Moderate   GOALS: Goals reviewed with patient? No  SHORT TERM GOALS: Target date: 01/13/2024  patient will be independent with initial HEP and compliant with HEP 3-4 times a week   Baseline: Goal status: INITIAL  2.  Patient will report 50% improvement overall  Baseline:  Goal status: INITIAL  LONG TERM GOALS: Target date: 01/27/2024  Patient will be independent in self management strategies to improve quality of life and functional outcomes.  Baseline:  Goal status: INITIAL  2.  Patient will report 70% improvement overall  Baseline:  Goal status: INITIAL  3.  Patient will improve 5 times sit to stand score to 12 sec or less to demonstrate improved functional mobility and increased leg strength.    Baseline: 15.89 sec Goal status: INITIAL  4.   Patient will increase bilateral leg MMT's to 4+ to 5/5 to allow navigation of steps without gait deviation or loss of balance  Baseline: see above Goal status: INITIAL  5.  Patient will improve Modified Oswestry score by 5 points to demonstrate improved perceived function  Baseline: 15/50 Goal status: INITIAL  PLAN:  PT FREQUENCY: 2x/week  PT DURATION: 4 weeks  PLANNED INTERVENTIONS: 97164- PT Re-evaluation, 97110-Therapeutic exercises, 97530- Therapeutic activity, 97112- Neuromuscular re-education, 97535- Self Care, 02859- Manual therapy, [id number]- Gait training, [phone]- Orthotic Fit/training, [phone]- Canalith repositioning, [id number]- Aquatic Therapy, [phone]- Splinting, [phone]- Wound care (first 20 sq cm), 97598- Wound care (each additional 20 sq cm)Patient/Family education, Balance  training, Stair training, Taping, Dry Needling, Joint mobilization, Joint manipulation, Spinal manipulation, Spinal mobilization, Scar mobilization, and DME instructions. SABRA  PLAN FOR NEXT SESSION: Review HEP and goals; lumbar decompression; postural and lower extremity strengthening; try to centralize symptoms first   9:40 AM, 01-04-2024 Dontrez Pettis Small Masin Shatto MPT [facility] [place] [phone] Ph:[phone]   Managed Medicaid Authorization Request Treatment Start Date: 01/04/2024  Visit Dx Codes: M54.16, M54.5, R26.2  Functional Tool Score: Modfied Oswestry 15/50; 30%  For all possible CPT codes, reference the Planned Interventions line above.     Check all conditions that are expected to impact treatment: {Conditions expected to impact treatment:None of these apply   If treatment provided at initial evaluation, no treatment charged due to lack of authorization.

## 2023-12-30 ENCOUNTER — Ambulatory Visit (HOSPITAL_COMMUNITY): Attending: Physician Assistant

## 2023-12-30 ENCOUNTER — Other Ambulatory Visit: Payer: Self-pay

## 2023-12-30 DIAGNOSIS — M545 Low back pain, unspecified: Secondary | ICD-10-CM | POA: Diagnosis not present

## 2023-12-30 DIAGNOSIS — M5416 Radiculopathy, lumbar region: Secondary | ICD-10-CM | POA: Diagnosis not present

## 2023-12-30 DIAGNOSIS — R262 Difficulty in walking, not elsewhere classified: Secondary | ICD-10-CM | POA: Diagnosis not present

## 2024-01-03 ENCOUNTER — Encounter (HOSPITAL_COMMUNITY): Payer: Self-pay

## 2024-01-03 ENCOUNTER — Ambulatory Visit (HOSPITAL_COMMUNITY)

## 2024-01-03 DIAGNOSIS — M5416 Radiculopathy, lumbar region: Secondary | ICD-10-CM

## 2024-01-03 DIAGNOSIS — R262 Difficulty in walking, not elsewhere classified: Secondary | ICD-10-CM

## 2024-01-03 DIAGNOSIS — M545 Low back pain, unspecified: Secondary | ICD-10-CM

## 2024-01-03 NOTE — Therapy (Signed)
 " OUTPATIENT PHYSICAL THERAPY THORACOLUMBAR TREATMENT   Patient Name: HONEST SAFRANEK MRN: [id number] DOB:Oct 25, 1990, 33 y.o., female Today's Date: 01/03/2024  END OF SESSION:  PT End of Session - 01/03/24 0732     Visit Number 2    Number of Visits 8    Date for Recertification  01/27/24    Authorization Type [employer]    Authorization Time Period please check auth    PT Start Time 0732    PT Stop Time 0813    PT Time Calculation (min) 41 min    Activity Tolerance Patient tolerated treatment well    Behavior During Therapy [facility] for tasks assessed/performed           Past Medical History:  Diagnosis Date   Abscess    right buttocks/thigh   Anxiety    Depression    DUB (dysfunctional uterine bleeding) 05/25/2012   Hydradenitis    Hypothyroidism    Polycystic ovaries    Thyroid  disease    hypothyroidism   Past Surgical History:  Procedure Laterality Date   ADENOIDECTOMY     BALLOON DILATION N/A 12/19/2019   Procedure: BALLOON DILATION;  Surgeon: Cindie Carlin POUR, DO;  [facility];  Service: Endoscopy;  Laterality: N/A;   BIOPSY  12/19/2019   Procedure: BIOPSY;  Surgeon: Cindie Carlin POUR, DO;  [facility];  Service: Endoscopy;;   CHOLECYSTECTOMY  01/27/2012   Procedure: LAPAROSCOPIC CHOLECYSTECTOMY;  Surgeon: Thresa JAYSON Pulling, MD;  Location: [facility];  Service: General;  Laterality: N/A;   DILITATION & CURRETTAGE/HYSTROSCOPY WITH HYDROTHERMAL ABLATION N/A 02/10/2023   Procedure: DILATATION & CURETTAGE/HYSTEROSCOPY WITH HYDROTHERMAL ABLATION;  Surgeon: Ozan, Jennifer, DO;  Location: [facility];  Service: Gynecology;  Laterality: N/A;   ESOPHAGOGASTRODUODENOSCOPY (EGD) WITH PROPOFOL  N/A 12/19/2019   Procedure: ESOPHAGOGASTRODUODENOSCOPY (EGD) WITH PROPOFOL ;  Surgeon: Cindie Carlin POUR, DO;  [facility];  Service: Endoscopy;  Laterality: N/A;  3:00pm   HERNIA REPAIR Left    LIH   HYDRADENITIS EXCISION  10/02/2011   Procedure: EXCISION HYDRADENITIS  AXILLA;  Surgeon: Thresa JAYSON Pulling, MD;  Location: [facility];  Service: General;  Laterality: Right;  Right Axillary Dissection   HYDRADENITIS EXCISION Left 10/19/2013   Procedure: INCISION OF HIDRADENITIS SUPPURATIVA LEFT AXILLA;  Surgeon: Elsie GORMAN Holland, MD;  Location: [facility];  Service: General;  Laterality: Left;   HYDRADENITIS EXCISION Left 09/09/2018   Procedure: EXCISION HIDRADENITIS AXILLA, LEFT;  Surgeon: Mavis Anes, MD;  Location: [facility];  Service: General;  Laterality: Left;   SHOULDER SURGERY  08/2018   TONSILLECTOMY     Patient Active Problem List   Diagnosis Date Noted   Lumbar radiculitis 12/14/2023   Depression, recurrent 12/14/2023   Medial epicondylitis of right elbow 12/14/2023   Menorrhagia with regular cycle 02/10/2023   Hidradenitis suppurativa     PCP: Grooms, [place], PA-C  REFERRING PROVIDER: Grooms, [place], PA-C  REFERRING DIAG: M54.16 (ICD-10-CM) - Lumbar radiculitis  Rationale for Evaluation and Treatment: Rehabilitation  THERAPY DIAG:  Lumbar radiculitis  Low back pain, unspecified back pain laterality, unspecified chronicity, unspecified whether sciatica present  Difficulty in walking, not elsewhere classified  ONSET DATE: about a year  SUBJECTIVE:  SUBJECTIVE STATEMENT: Patient reports that she is stiff this morning. She notes that her HEP is going ok.   Eval: Pain in back that goes down left leg for about a year; Her MD Dr. Bertell; treated with prednisone  and ibuprofen .  Fusco retired and saw a new MD who wanted to try PT; x-ray showed mild degenerative changes.  Sometimes hurts in the mornings in the back and when she works it runs down her legs.  Does some lifting up to 50# and stands on cement flooring.  Pain will go from back down left leg; mostly just to  knee but sometimes into the foot.    PERTINENT HISTORY:  depression  PAIN:  Are you having pain? Yes: NPRS scale: 4/10 low back; 2/10 at best and 10/10 at worst Pain location: low back and left leg Pain description: stiff; shooting and stabbing Aggravating factors: work Relieving factors: rest  PRECAUTIONS: None  RED FLAGS: None   WEIGHT BEARING RESTRICTIONS: No  FALLS:  Has patient fallen in last 6 months? No  OCCUPATION: works at [employer]  PLOF: Independent  PATIENT GOALS: no pain in back and leg  NEXT MD VISIT: Jan 15th, 2026  OBJECTIVE:  Note: Objective measures were completed at Evaluation unless otherwise noted.  DIAGNOSTIC FINDINGS:  CLINICAL DATA:  Low back pain radiating down the left leg for the past year. No known injury.   EXAM: LUMBAR SPINE - 2-3 VIEW   COMPARISON:  Abdomen and pelvis CT dated 04/08/2021.   FINDINGS: Five non-rib-bearing lumbar vertebrae. Minimal anterior spur formation at the L3-4 level. No fractures, pars defects or subluxations. Cholecystectomy clips.   IMPRESSION: Minimal degenerative changes at the L3-4 level.    PATIENT SURVEYS:  Modified Oswestry:  MODIFIED OSWESTRY DISABILITY SCALE  Date: 12/30/2023 Score                                Total 15/50; 30%   Interpretation of scores: Score Category Description  0-20% Minimal Disability The patient can cope with most living activities. Usually no treatment is indicated apart from advice on lifting, sitting and exercise  21-40% Moderate Disability The patient experiences more pain and difficulty with sitting, lifting and standing. Travel and social life are more difficult and they may be disabled from work. Personal care, sexual activity and sleeping are not grossly affected, and the patient can usually be managed by conservative means  41-60% Severe Disability Pain remains the main problem in this group, but activities of daily living are affected. These  patients require a detailed investigation  61-80% Crippled Back pain impinges on all aspects of the patients life. Positive intervention is required  81-100% Bed-bound These patients are either bed-bound or exaggerating their symptoms  Bluford FORBES Zoe DELENA Karon DELENA, et al. Surgery versus conservative management of stable thoracolumbar fracture: the PRESTO feasibility RCT. [place] ([place]): [employer]; 2021 Nov. [facility] Technology Assessment, No. 25.62.) Appendix 3, Oswestry Disability Index category descriptors. Available from: Findjewelers.cz  Minimally Clinically Important Difference (MCID) = 12.8%  COGNITION: Overall cognitive status: Within functional limits for tasks assessed     SENSATION: WFL Had numbness only  once  MUSCLE LENGTH: Hamstrings:   POSTURE: rounded shoulders, forward head, and decreased lumbar lordosis  PALPATION: Tenderness in low back more left side than right  LUMBAR ROM: prone lying increased pain down left leg  AROM eval  Flexion Fingers to ankles  Extension 30% available  Low back pain  Right lateral flexion   Left lateral flexion   Right rotation   Left rotation    (Blank rows = not tested)  LOWER EXTREMITY ROM:     Active  Right eval Left eval  Hip flexion    Hip extension    Hip abduction    Hip adduction    Hip internal rotation    Hip external rotation    Knee flexion    Knee extension    Ankle dorsiflexion    Ankle plantarflexion    Ankle inversion    Ankle eversion     (Blank rows = not tested)  LOWER EXTREMITY MMT:    MMT Right eval Left eval  Hip flexion 5 4+  Hip extension 4+ 4-  Hip abduction    Hip adduction    Hip internal rotation    Hip external rotation    Knee flexion 4+ 4  Knee extension 5 5  Ankle dorsiflexion 5 5  Ankle plantarflexion    Ankle inversion    Ankle eversion     (Blank rows = not tested)  FUNCTIONAL TESTS:  5 times sit to stand: 15.89 sec no UE  assist  GAIT: Distance walked: 50 ft in clinic Assistive device utilized: None Level of assistance: Modified independence Comments: slight decreased stance left lower extremity  TREATMENT DATE:                                    01/03/24 EXERCISE LOG  Exercise Repetitions and Resistance Comments  Supine sciatic nerve glide  10 reps    Supine piriformis stretch  5 x 10 seconds   Double knee to chest  5 x 10 second hold     Lower trunk rotation   10 reps each    Supine hip ADD isometric  2 minutes w/ 5 second hold    Lunges onto step  15 reps each    Standing hamstring stretch  3 x 30 seconds each    Manual therapy  STM to bilateral lumbar paraspinals    Self STM with tennis ball     Lumbar roll out  2 minutes     Blank cell = exercise not performed today   12/30/2023 physical therapy evaluation and HEP instruction                                                                                                                                 PATIENT EDUCATION:  Education details: Patient educated on exam findings, POC, scope of PT, HEP, and what to expect next visit. Person educated: Patient Education method: Explanation, Demonstration, and Handouts Education comprehension: verbalized understanding, returned demonstration, verbal cues required, and tactile cues required   HOME EXERCISE PROGRAM: Access Code: [email or web address] URL: [email or web address] Date: 12/30/2023 Prepared by: [facility]  Exercises - Lying Prone with  1 Pillow  - 1 x daily - 7 x weekly - 1 reps - 5 min hold - Hooklying Single Knee to Chest Stretch  - 1 x daily - 7 x weekly - 1 sets - 10 reps - 5 hold - Supine Sciatic Nerve Glide  - 1 x daily - 7 x weekly - 1 sets - 10 reps - Supine Figure 4 Piriformis Stretch  - 1 x daily - 7 x weekly - 1 sets - 10 reps - 20 sec hold  ASSESSMENT:  CLINICAL IMPRESSION: Patient was introduced to multiple new interventions for improved lumbar mobility and reduced  pain. Her HEP was reviewed and she exhibited good recall of these interventions. She required minimal cueing with today's new interventions for proper exercise performance. She experienced a mild increase in her familiar symptoms as today's treatment progressed with the standing hamstring stretch being the most aggravating to her familiar symptoms. She reported feeling that her symptoms were slightly agitated upon the conclusion of treatment. Patient continues to require skilled physical therapy to address her remaining impairments to return to her prior level of function.    Eval: Patient is a 33 y.o. female who was seen today for physical therapy evaluation and treatment for M54.16 (ICD-10-CM) - Lumbar radiculitis.  Patient demonstrates muscle weakness, reduced ROM, and fascial restrictions which are likely contributing to symptoms of pain and are negatively impacting patient ability to perform ADLs and functional mobility tasks. Patient will benefit from skilled physical therapy services to address these deficits to reduce pain and improve level of function with ADLs and functional mobility tasks.   OBJECTIVE IMPAIRMENTS: Abnormal gait, decreased activity tolerance, decreased mobility, decreased strength, increased fascial restrictions, impaired perceived functional ability, postural dysfunction, and pain.   ACTIVITY LIMITATIONS: carrying, lifting, bending, standing, and locomotion level  PARTICIPATION LIMITATIONS: meal prep, cleaning, laundry, and occupation  REHAB POTENTIAL: Good  CLINICAL DECISION MAKING: Evolving/moderate complexity  EVALUATION COMPLEXITY: Moderate   GOALS: Goals reviewed with patient? No  SHORT TERM GOALS: Target date: 01/13/2024  patient will be independent with initial HEP and compliant with HEP 3-4 times a week   Baseline: Goal status: MET  2.  Patient will report 50% improvement overall  Baseline:  Goal status: INITIAL  LONG TERM GOALS: Target date:  01/27/2024  Patient will be independent in self management strategies to improve quality of life and functional outcomes.  Baseline:  Goal status: INITIAL  2.  Patient will report 70% improvement overall  Baseline:  Goal status: INITIAL  3.  Patient will improve 5 times sit to stand score to 12 sec or less to demonstrate improved functional mobility and increased leg strength.    Baseline: 15.89 sec Goal status: INITIAL  4.   Patient will increase bilateral leg MMT's to 4+ to 5/5 to allow navigation of steps without gait deviation or loss of balance  Baseline: see above Goal status: INITIAL  5.  Patient will improve Modified Oswestry score by 5 points to demonstrate improved perceived function  Baseline: 15/50 Goal status: INITIAL  PLAN:  PT FREQUENCY: 2x/week  PT DURATION: 4 weeks  PLANNED INTERVENTIONS: 97164- PT Re-evaluation, 97110-Therapeutic exercises, 97530- Therapeutic activity, 97112- Neuromuscular re-education, 97535- Self Care, [id number]- Manual therapy, [phone]- Gait training, [phone]- Orthotic Fit/training, [phone]- Canalith repositioning, [id number]- Aquatic Therapy, [phone]- Splinting, [phone]- Wound care (first 20 sq cm), 97598- Wound care (each additional 20 sq cm)Patient/Family education, Balance training, Stair training, Taping, Dry Needling, Joint mobilization, Joint manipulation, Spinal manipulation, Spinal mobilization, Scar mobilization,  and DME instructions. SABRA  PLAN FOR NEXT SESSION: Review HEP and goals; lumbar decompression; postural and lower extremity strengthening; try to centralize symptoms first  Lacinda Fass, PT, DPT  9:57 AM, 01/03/2024  "

## 2024-01-19 ENCOUNTER — Encounter (HOSPITAL_COMMUNITY): Payer: Self-pay

## 2024-01-19 ENCOUNTER — Ambulatory Visit (HOSPITAL_COMMUNITY): Attending: Physician Assistant

## 2024-01-19 DIAGNOSIS — M545 Low back pain, unspecified: Secondary | ICD-10-CM | POA: Insufficient documentation

## 2024-01-19 DIAGNOSIS — M5416 Radiculopathy, lumbar region: Secondary | ICD-10-CM | POA: Diagnosis present

## 2024-01-19 DIAGNOSIS — R262 Difficulty in walking, not elsewhere classified: Secondary | ICD-10-CM | POA: Insufficient documentation

## 2024-01-19 NOTE — Therapy (Signed)
 " OUTPATIENT PHYSICAL THERAPY THORACOLUMBAR TREATMENT   Patient Name: LIBI CORSO MRN: [id number] DOB:Jul 08, 1990, 34 y.o., female Today's Date: 01/19/2024  END OF SESSION:  PT End of Session - 01/19/24 0811     Visit Number 3    Number of Visits 8    Date for Recertification  01/27/24    Authorization Type BC[employer]    Authorization Time Period please check auth    PT Start Time 9185    PT Stop Time 0853    PT Time Calculation (min) 39 min    Activity Tolerance Patient tolerated treatment well;Patient limited by pain    Behavior During Therapy [facility] for tasks assessed/performed           Past Medical History:  Diagnosis Date   Abscess    right buttocks/thigh   Anxiety    Depression    DUB (dysfunctional uterine bleeding) 05/25/2012   Hydradenitis    Hypothyroidism    Polycystic ovaries    Thyroid  disease    hypothyroidism   Past Surgical History:  Procedure Laterality Date   ADENOIDECTOMY     BALLOON DILATION N/A 12/19/2019   Procedure: BALLOON DILATION;  Surgeon: Cindie Carlin POUR, DO;  [facility];  Service: Endoscopy;  Laterality: N/A;   BIOPSY  12/19/2019   Procedure: BIOPSY;  Surgeon: Cindie Carlin POUR, DO;  [facility];  Service: Endoscopy;;   CHOLECYSTECTOMY  01/27/2012   Procedure: LAPAROSCOPIC CHOLECYSTECTOMY;  Surgeon: Thresa JAYSON Pulling, MD;  Location: [facility];  Service: General;  Laterality: N/A;   DILITATION & CURRETTAGE/HYSTROSCOPY WITH HYDROTHERMAL ABLATION N/A 02/10/2023   Procedure: DILATATION & CURETTAGE/HYSTEROSCOPY WITH HYDROTHERMAL ABLATION;  Surgeon: Ozan, Jennifer, DO;  Location: [facility];  Service: Gynecology;  Laterality: N/A;   ESOPHAGOGASTRODUODENOSCOPY (EGD) WITH PROPOFOL  N/A 12/19/2019   Procedure: ESOPHAGOGASTRODUODENOSCOPY (EGD) WITH PROPOFOL ;  Surgeon: Cindie Carlin POUR, DO;  [facility];  Service: Endoscopy;  Laterality: N/A;  3:00pm   HERNIA REPAIR Left    LIH   HYDRADENITIS EXCISION  10/02/2011   Procedure:  EXCISION HYDRADENITIS AXILLA;  Surgeon: Thresa JAYSON Pulling, MD;  Location: [facility];  Service: General;  Laterality: Right;  Right Axillary Dissection   HYDRADENITIS EXCISION Left 10/19/2013   Procedure: INCISION OF HIDRADENITIS SUPPURATIVA LEFT AXILLA;  Surgeon: Elsie GORMAN Holland, MD;  Location: [facility];  Service: General;  Laterality: Left;   HYDRADENITIS EXCISION Left 09/09/2018   Procedure: EXCISION HIDRADENITIS AXILLA, LEFT;  Surgeon: Mavis Anes, MD;  Location: [facility];  Service: General;  Laterality: Left;   SHOULDER SURGERY  08/2018   TONSILLECTOMY     Patient Active Problem List   Diagnosis Date Noted   Lumbar radiculitis 12/14/2023   Depression, recurrent 12/14/2023   Medial epicondylitis of right elbow 12/14/2023   Menorrhagia with regular cycle 02/10/2023   Hidradenitis suppurativa     PCP: Grooms, [place], PA-C  REFERRING PROVIDER: Grooms, [place], PA-C  REFERRING DIAG: M54.16 (ICD-10-CM) - Lumbar radiculitis  Rationale for Evaluation and Treatment: Rehabilitation  THERAPY DIAG:  Lumbar radiculitis  Low back pain, unspecified back pain laterality, unspecified chronicity, unspecified whether sciatica present  Difficulty in walking, not elsewhere classified  ONSET DATE: about a year  SUBJECTIVE:  SUBJECTIVE STATEMENT: Reports a flare up today, increased pain LBP and radicular symptoms posterior ending at knee.  Eval: Pain in back that goes down left leg for about a year; Her MD Dr. Bertell; treated with prednisone  and ibuprofen .  Fusco retired and saw a new MD who wanted to try PT; x-ray showed mild degenerative changes.  Sometimes hurts in the mornings in the back and when she works it runs down her legs.  Does some lifting up to 50# and stands on cement flooring.  Pain will go from back  down left leg; mostly just to knee but sometimes into the foot.    PERTINENT HISTORY:  depression  PAIN:  Are you having pain? Yes: NPRS scale: 7/10 low back; 2/10 at best and 10/10 at worst Pain location: low back and left leg Pain description: stiff; shooting and stabbing Aggravating factors: work Relieving factors: rest  PRECAUTIONS: None  RED FLAGS: None   WEIGHT BEARING RESTRICTIONS: No  FALLS:  Has patient fallen in last 6 months? No  OCCUPATION: works at [employer]  PLOF: Independent  PATIENT GOALS: no pain in back and leg  NEXT MD VISIT: Jan 15th, 2026  OBJECTIVE:  Note: Objective measures were completed at Evaluation unless otherwise noted.  DIAGNOSTIC FINDINGS:  CLINICAL DATA:  Low back pain radiating down the left leg for the past year. No known injury.   EXAM: LUMBAR SPINE - 2-3 VIEW   COMPARISON:  Abdomen and pelvis CT dated 04/08/2021.   FINDINGS: Five non-rib-bearing lumbar vertebrae. Minimal anterior spur formation at the L3-4 level. No fractures, pars defects or subluxations. Cholecystectomy clips.   IMPRESSION: Minimal degenerative changes at the L3-4 level.    PATIENT SURVEYS:  Modified Oswestry:  MODIFIED OSWESTRY DISABILITY SCALE  Date: 12/30/2023 Score                                Total 15/50; 30%   Interpretation of scores: Score Category Description  0-20% Minimal Disability The patient can cope with most living activities. Usually no treatment is indicated apart from advice on lifting, sitting and exercise  21-40% Moderate Disability The patient experiences more pain and difficulty with sitting, lifting and standing. Travel and social life are more difficult and they may be disabled from work. Personal care, sexual activity and sleeping are not grossly affected, and the patient can usually be managed by conservative means  41-60% Severe Disability Pain remains the main problem in this group, but activities of daily  living are affected. These patients require a detailed investigation  61-80% Crippled Back pain impinges on all aspects of the patients life. Positive intervention is required  81-100% Bed-bound These patients are either bed-bound or exaggerating their symptoms  Bluford FORBES Zoe DELENA Karon DELENA, et al. Surgery versus conservative management of stable thoracolumbar fracture: the PRESTO feasibility RCT. [place] ([place]): [employer]; 2021 Nov. [facility] Technology Assessment, No. 25.62.) Appendix 3, Oswestry Disability Index category descriptors. Available from: Findjewelers.cz  Minimally Clinically Important Difference (MCID) = 12.8%  COGNITION: Overall cognitive status: Within functional limits for tasks assessed     SENSATION: WFL Had numbness only  once  MUSCLE LENGTH: Hamstrings:   POSTURE: rounded shoulders, forward head, and decreased lumbar lordosis  PALPATION: Tenderness in low back more left side than right  LUMBAR ROM: prone lying increased pain down left leg  AROM eval  Flexion Fingers to ankles  Extension 30% available Low back  pain  Right lateral flexion   Left lateral flexion   Right rotation   Left rotation    (Blank rows = not tested)  LOWER EXTREMITY ROM:     Active  Right eval Left eval  Hip flexion    Hip extension    Hip abduction    Hip adduction    Hip internal rotation    Hip external rotation    Knee flexion    Knee extension    Ankle dorsiflexion    Ankle plantarflexion    Ankle inversion    Ankle eversion     (Blank rows = not tested)  LOWER EXTREMITY MMT:    MMT Right eval Left eval  Hip flexion 5 4+  Hip extension 4+ 4-  Hip abduction    Hip adduction    Hip internal rotation    Hip external rotation    Knee flexion 4+ 4  Knee extension 5 5  Ankle dorsiflexion 5 5  Ankle plantarflexion    Ankle inversion    Ankle eversion     (Blank rows = not tested)  FUNCTIONAL TESTS:  5 times  sit to stand: 15.89 sec no UE assist  GAIT: Distance walked: 50 ft in clinic Assistive device utilized: None Level of assistance: Modified independence Comments: slight decreased stance left lower extremity  TREATMENT DATE:  01/19/24: Log rolling Supine:  - Decompression exercise  - Discussed benefits with MHP vs ice, trial with MHP this session.  - Educated piriformis anatomy, shown picture and educated location  - LTR 5x 10  - SKTC 2x 30  - Hamstring stretch 2x 30 hands behind knee  - Bridge 10x 5 with ab set Sidelying:  - Clam RTB                                  01/03/24 EXERCISE LOG  Exercise Repetitions and Resistance Comments  Supine sciatic nerve glide  10 reps    Supine piriformis stretch  5 x 10 seconds   Double knee to chest  5 x 10 second hold     Lower trunk rotation   10 reps each    Supine hip ADD isometric  2 minutes w/ 5 second hold    Lunges onto step  15 reps each    Standing hamstring stretch  3 x 30 seconds each    Manual therapy  STM to bilateral lumbar paraspinals    Self STM with tennis ball     Lumbar roll out  2 minutes     Blank cell = exercise not performed today   12/30/2023 physical therapy evaluation and HEP instruction                                                                                                                                 PATIENT EDUCATION:  Education details: Patient  educated on exam findings, POC, scope of PT, HEP, and what to expect next visit. Person educated: Patient Education method: Explanation, Demonstration, and Handouts Education comprehension: verbalized understanding, returned demonstration, verbal cues required, and tactile cues required   HOME EXERCISE PROGRAM: Access Code: [email or web address] URL: [email or web address] Date: 12/30/2023 Prepared by: [facility]  Exercises - Lying Prone with 1 Pillow  - 1 x daily - 7 x weekly - 1 reps - 5 min hold - Hooklying Single Knee to Chest Stretch  -  1 x daily - 7 x weekly - 1 sets - 10 reps - 5 hold - Supine Sciatic Nerve Glide  - 1 x daily - 7 x weekly - 1 sets - 10 reps - Supine Figure 4 Piriformis Stretch  - 1 x daily - 7 x weekly - 1 sets - 10 reps - 20 sec hold  01/19/24: - Bridge  - Decompression   ASSESSMENT:  CLINICAL IMPRESSION: Pt arrived with reports of increased pain today.  Educated on log rolling to reduce stress on lumbar region.  Began session with decompression for posterior chain strengthening.  Discussed and educated benefits with heat vs ice and trial with MHP for pain control as reports of stiffness and tightness.  Educated locatoin and purpose of piriformis and shown anatomy with sciatic nerve.  Encouraged increased hold times with stretches for maximal benefits for mobility.  Reports of radicular symptoms resolved following decompression and LE stretches.  Added decompression and bridges to HEP with printout given and verbalized understanding.    Eval: Patient is a 34 y.o. female who was seen today for physical therapy evaluation and treatment for M54.16 (ICD-10-CM) - Lumbar radiculitis.  Patient demonstrates muscle weakness, reduced ROM, and fascial restrictions which are likely contributing to symptoms of pain and are negatively impacting patient ability to perform ADLs and functional mobility tasks. Patient will benefit from skilled physical therapy services to address these deficits to reduce pain and improve level of function with ADLs and functional mobility tasks.   OBJECTIVE IMPAIRMENTS: Abnormal gait, decreased activity tolerance, decreased mobility, decreased strength, increased fascial restrictions, impaired perceived functional ability, postural dysfunction, and pain.   ACTIVITY LIMITATIONS: carrying, lifting, bending, standing, and locomotion level  PARTICIPATION LIMITATIONS: meal prep, cleaning, laundry, and occupation  REHAB POTENTIAL: Good  CLINICAL DECISION MAKING: Evolving/moderate  complexity  EVALUATION COMPLEXITY: Moderate   GOALS: Goals reviewed with patient? No  SHORT TERM GOALS: Target date: 01/13/2024  patient will be independent with initial HEP and compliant with HEP 3-4 times a week   Baseline: Goal status: MET  2.  Patient will report 50% improvement overall  Baseline:  Goal status: INITIAL  LONG TERM GOALS: Target date: 01/27/2024  Patient will be independent in self management strategies to improve quality of life and functional outcomes.  Baseline:  Goal status: INITIAL  2.  Patient will report 70% improvement overall  Baseline:  Goal status: INITIAL  3.  Patient will improve 5 times sit to stand score to 12 sec or less to demonstrate improved functional mobility and increased leg strength.    Baseline: 15.89 sec Goal status: INITIAL  4.   Patient will increase bilateral leg MMT's to 4+ to 5/5 to allow navigation of steps without gait deviation or loss of balance  Baseline: see above Goal status: INITIAL  5.  Patient will improve Modified Oswestry score by 5 points to demonstrate improved perceived function  Baseline: 15/50 Goal status: INITIAL  PLAN:  PT FREQUENCY: 2x/week  PT DURATION:  4 weeks  PLANNED INTERVENTIONS: 97164- PT Re-evaluation, 97110-Therapeutic exercises, 97530- Therapeutic activity, [id number]- Neuromuscular re-education, 97535- Self Care, [id number]- Manual therapy, [phone]- Gait training, [phone]- Orthotic Fit/training, [phone]- Canalith repositioning, [id number]- Aquatic Therapy, [phone]- Splinting, 97597- Wound care (first 20 sq cm), 97598- Wound care (each additional 20 sq cm)Patient/Family education, Balance training, Stair training, Taping, Dry Needling, Joint mobilization, Joint manipulation, Spinal manipulation, Spinal mobilization, Scar mobilization, and DME instructions. SABRA  PLAN FOR NEXT SESSION: F/U with lumbar decompression; postural and lower extremity strengthening; try to centralize symptoms first  Augustin Mclean, LPTA/CLT; CBIS [phone]  1:02 PM, 01/19/2024  "

## 2024-01-20 ENCOUNTER — Ambulatory Visit (HOSPITAL_COMMUNITY)

## 2024-01-20 ENCOUNTER — Encounter (HOSPITAL_COMMUNITY): Payer: Self-pay

## 2024-01-20 DIAGNOSIS — M545 Low back pain, unspecified: Secondary | ICD-10-CM

## 2024-01-20 DIAGNOSIS — M5416 Radiculopathy, lumbar region: Secondary | ICD-10-CM | POA: Diagnosis not present

## 2024-01-20 DIAGNOSIS — R262 Difficulty in walking, not elsewhere classified: Secondary | ICD-10-CM

## 2024-01-20 NOTE — Therapy (Signed)
 " OUTPATIENT PHYSICAL THERAPY THORACOLUMBAR TREATMENT   Patient Name: Kathleen Velasquez MRN: [id number] DOB:[birth year], 34 y.o., female Today's Date: 01/20/2024  END OF SESSION:  PT End of Session - 01/20/24 1631     Visit Number 4    Number of Visits 8    Date for Recertification  01/27/24    Authorization Type BC[employer]    Authorization Time Period please check auth    PT Start Time 1631    PT Stop Time 1710    PT Time Calculation (min) 39 min    Activity Tolerance Patient tolerated treatment well;Patient limited by pain    Behavior During Therapy [facility] for tasks assessed/performed            Past Medical History:  Diagnosis Date   Abscess    right buttocks/thigh   Anxiety    Depression    DUB (dysfunctional uterine bleeding) 05/25/2012   Hydradenitis    Hypothyroidism    Polycystic ovaries    Thyroid  disease    hypothyroidism   Past Surgical History:  Procedure Laterality Date   ADENOIDECTOMY     BALLOON DILATION N/A 12/19/2019   Procedure: BALLOON DILATION;  Surgeon: Cindie Carlin POUR, DO;  [facility];  Service: Endoscopy;  Laterality: N/A;   BIOPSY  12/19/2019   Procedure: BIOPSY;  Surgeon: Cindie Carlin POUR, DO;  Location: [facility];  Service: Endoscopy;;   CHOLECYSTECTOMY  01/27/2012   Procedure: LAPAROSCOPIC CHOLECYSTECTOMY;  Surgeon: Thresa JAYSON Pulling, MD;  Location: [facility];  Service: General;  Laterality: N/A;   DILITATION & CURRETTAGE/HYSTROSCOPY WITH HYDROTHERMAL ABLATION N/A 02/10/2023   Procedure: DILATATION & CURETTAGE/HYSTEROSCOPY WITH HYDROTHERMAL ABLATION;  Surgeon: Ozan, Jennifer, DO;  Location: [facility];  Service: Gynecology;  Laterality: N/A;   ESOPHAGOGASTRODUODENOSCOPY (EGD) WITH PROPOFOL  N/A 12/19/2019   Procedure: ESOPHAGOGASTRODUODENOSCOPY (EGD) WITH PROPOFOL ;  Surgeon: Cindie Carlin POUR, DO;  [facility];  Service: Endoscopy;  Laterality: N/A;  3:00pm   HERNIA REPAIR Left    LIH   HYDRADENITIS EXCISION  10/02/2011    Procedure: EXCISION HYDRADENITIS AXILLA;  Surgeon: Thresa JAYSON Pulling, MD;  Location: [facility];  Service: General;  Laterality: Right;  Right Axillary Dissection   HYDRADENITIS EXCISION Left 10/19/2013   Procedure: INCISION OF HIDRADENITIS SUPPURATIVA LEFT AXILLA;  Surgeon: Elsie GORMAN Holland, MD;  Location: [facility];  Service: General;  Laterality: Left;   HYDRADENITIS EXCISION Left 09/09/2018   Procedure: EXCISION HIDRADENITIS AXILLA, LEFT;  Surgeon: Mavis Anes, MD;  Location: [facility];  Service: General;  Laterality: Left;   SHOULDER SURGERY  08/2018   TONSILLECTOMY     Patient Active Problem List   Diagnosis Date Noted   Lumbar radiculitis 12/14/2023   Depression, recurrent 12/14/2023   Medial epicondylitis of right elbow 12/14/2023   Menorrhagia with regular cycle 02/10/2023   Hidradenitis suppurativa     PCP: Grooms, Max, PA-C  REFERRING PROVIDER: Grooms, [place], PA-C  REFERRING DIAG: M54.16 (ICD-10-CM) - Lumbar radiculitis  Rationale for Evaluation and Treatment: Rehabilitation  THERAPY DIAG:  Lumbar radiculitis  Low back pain, unspecified back pain laterality, unspecified chronicity, unspecified whether sciatica present  Difficulty in walking, not elsewhere classified  ONSET DATE: about a year  SUBJECTIVE:  SUBJECTIVE STATEMENT: Pt states back pain has gotten worse, symptoms reported all the way to the left foot this date. Has come straight from work, pain has gotten worse as the day progresses. 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 Pain is a 8/10 upon presentation.   Eval: Pain in back that goes down left leg for about a year; Her MD Dr. Bertell; treated with prednisone  and ibuprofen .  Fusco retired and saw a new MD who wanted to try PT; x-ray showed mild  degenerative changes.  Sometimes hurts in the mornings in the back and when she works it runs down her legs.  Does some lifting up to 50# and stands on cement flooring.  Pain will go from back down left leg; mostly just to knee but sometimes into the foot.    PERTINENT HISTORY:  depression  PAIN:  Are you having pain? Yes: NPRS scale: 7/10 low back; 2/10 at best and 10/10 at worst Pain location: low back and left leg Pain description: stiff; shooting and stabbing Aggravating factors: work Relieving factors: rest  PRECAUTIONS: None  RED FLAGS: None   WEIGHT BEARING RESTRICTIONS: No  FALLS:  Has patient fallen in last 6 months? No  OCCUPATION: works at [employer]  PLOF: Independent  PATIENT GOALS: no pain in back and leg  NEXT MD VISIT: Jan 15th, 2026  OBJECTIVE:  Note: Objective measures were completed at Evaluation unless otherwise noted.  DIAGNOSTIC FINDINGS:  CLINICAL DATA:  Low back pain radiating down the left leg for the past year. No known injury.   EXAM: LUMBAR SPINE - 2-3 VIEW   COMPARISON:  Abdomen and pelvis CT dated 04/08/2021.   FINDINGS: Five non-rib-bearing lumbar vertebrae. Minimal anterior spur formation at the L3-4 level. No fractures, pars defects or subluxations. Cholecystectomy clips.   IMPRESSION: Minimal degenerative changes at the L3-4 level.    PATIENT SURVEYS:  Modified Oswestry:  MODIFIED OSWESTRY DISABILITY SCALE  Date: 12/30/2023 Score                                Total 15/50; 30%   Interpretation of scores: Score Category Description  0-20% Minimal Disability The patient can cope with most living activities. Usually no treatment is indicated apart from advice on lifting, sitting and exercise  21-40% Moderate Disability The patient experiences more pain and difficulty with sitting, lifting and standing. Travel and social life are more difficult and they may be disabled from work. Personal care, sexual activity and  sleeping are not grossly affected, and the patient can usually be managed by conservative means  41-60% Severe Disability Pain remains the main problem in this group, but activities of daily living are affected. These patients require a detailed investigation  61-80% Crippled Back pain impinges on all aspects of the patients life. Positive intervention is required  81-100% Bed-bound These patients are either bed-bound or exaggerating their symptoms  Bluford FORBES Zoe DELENA Karon DELENA, et al. Surgery versus conservative management of stable thoracolumbar fracture: the PRESTO feasibility RCT. [place] ([place]): [employer]; 2021 Nov. [facility] Technology Assessment, No. 25.62.) Appendix 3, Oswestry Disability Index category descriptors. Available from: Findjewelers.cz  Minimally Clinically Important Difference (MCID) = 12.8%  COGNITION: Overall cognitive status: Within functional limits for tasks assessed     SENSATION: WFL Had numbness only  once  MUSCLE LENGTH: Hamstrings:   POSTURE: rounded shoulders, forward head, and  decreased lumbar lordosis  PALPATION: Tenderness in low back more left side than right  LUMBAR ROM: prone lying increased pain down left leg  AROM eval  Flexion Fingers to ankles  Extension 30% available Low back pain  Right lateral flexion   Left lateral flexion   Right rotation   Left rotation    (Blank rows = not tested)  LOWER EXTREMITY ROM:     Active  Right eval Left eval  Hip flexion    Hip extension    Hip abduction    Hip adduction    Hip internal rotation    Hip external rotation    Knee flexion    Knee extension    Ankle dorsiflexion    Ankle plantarflexion    Ankle inversion    Ankle eversion     (Blank rows = not tested)  LOWER EXTREMITY MMT:    MMT Right eval Left eval  Hip flexion 5 4+  Hip extension 4+ 4-  Hip abduction    Hip adduction    Hip internal rotation    Hip external rotation     Knee flexion 4+ 4  Knee extension 5 5  Ankle dorsiflexion 5 5  Ankle plantarflexion    Ankle inversion    Ankle eversion     (Blank rows = not tested)  FUNCTIONAL TESTS:  5 times sit to stand: 15.89 sec no UE assist  GAIT: Distance walked: 50 ft in clinic Assistive device utilized: None Level of assistance: Modified independence Comments: slight decreased stance left lower extremity  TREATMENT DATE:  01/20/2024  Manual Therapy: -CPA/UPA mobilizations of Lumbar segments L1-L5, grade II-III  -STM of Lumbar Paraspinal musculature Neuromuscular Re-education: -Posterior pelvic tilts, 1 set of 10 reps, 3 second holds, pt cued to remain in pain free ROM, while on ice pack -Lower trunk rotations, 2 set of 10 reps, bilaterally, pt cued to remain in pain free ROM, while on ice pack -Prone on elbows, 1 bout of 2 minutes, slight centralization of symptoms Therapeutic Activity: -Lateral stepping, 1 laps, 10 feet per lap, with RTB around knees, pt cued for upright posture and slight bend in knees -STS, 10 reps, RTB at knees, pt cued for neutral spine and increased width of stance   01/19/24: Log rolling Supine:  - Decompression exercise  - Discussed benefits with MHP vs ice, trial with MHP this session.  - Educated piriformis anatomy, shown picture and educated location  - LTR 5x 10  - SKTC 2x 30  - Hamstring stretch 2x 30 hands behind knee  - Bridge 10x 5 with ab set Sidelying:  - Clam RTB                                  01/03/24 EXERCISE LOG  Exercise Repetitions and Resistance Comments  Supine sciatic nerve glide  10 reps    Supine piriformis stretch  5 x 10 seconds   Double knee to chest  5 x 10 second hold     Lower trunk rotation   10 reps each    Supine hip ADD isometric  2 minutes w/ 5 second hold    Lunges onto step  15 reps each    Standing hamstring stretch  3 x 30 seconds each    Manual therapy  STM to bilateral lumbar paraspinals    Self STM with tennis ball      Lumbar roll out  2 minutes     Blank cell = exercise not performed today   PATIENT EDUCATION:  Education details: Patient educated on exam findings, POC, scope of PT, HEP, and what to expect next visit. Person educated: Patient Education method: Explanation, Demonstration, and Handouts Education comprehension: verbalized understanding, returned demonstration, verbal cues required, and tactile cues required   HOME EXERCISE PROGRAM: Access Code: [email or web address] URL: [email or web address] Date: 12/30/2023 Prepared by: [facility]  Exercises - Lying Prone with 1 Pillow  - 1 x daily - 7 x weekly - 1 reps - 5 min hold - Hooklying Single Knee to Chest Stretch  - 1 x daily - 7 x weekly - 1 sets - 10 reps - 5 hold - Supine Sciatic Nerve Glide  - 1 x daily - 7 x weekly - 1 sets - 10 reps - Supine Figure 4 Piriformis Stretch  - 1 x daily - 7 x weekly - 1 sets - 10 reps - 20 sec hold  01/19/24: - Bridge  - Decompression   ASSESSMENT:  CLINICAL IMPRESSION: Patient continues to demonstrate significant increase in low back pain, decreased LE/core strength, decreased gait quality and activity tolerance. Patient also demonstrates increase in peripheralization compared to last time with symptoms running down to foot, slight improvement noted with prone on elbows. Patient able to progress dynamic balance and core activation exercises today with lateral stepping and posterior pelvic tilts, good performance with verbal cueing. Patient would continue to benefit from skilled physical therapy for decreased low back pain, increased endurance with ambulation, increased LE/core strength, and improved activity tolerance for improved quality of life, improved independence with management of low back and continued progress towards therapy goals.     Eval: Patient is a 34 y.o. female who was seen today for physical therapy evaluation and treatment for M54.16 (ICD-10-CM) - Lumbar radiculitis.  Patient  demonstrates muscle weakness, reduced ROM, and fascial restrictions which are likely contributing to symptoms of pain and are negatively impacting patient ability to perform ADLs and functional mobility tasks. Patient will benefit from skilled physical therapy services to address these deficits to reduce pain and improve level of function with ADLs and functional mobility tasks.   OBJECTIVE IMPAIRMENTS: Abnormal gait, decreased activity tolerance, decreased mobility, decreased strength, increased fascial restrictions, impaired perceived functional ability, postural dysfunction, and pain.   ACTIVITY LIMITATIONS: carrying, lifting, bending, standing, and locomotion level  PARTICIPATION LIMITATIONS: meal prep, cleaning, laundry, and occupation  REHAB POTENTIAL: Good  CLINICAL DECISION MAKING: Evolving/moderate complexity  EVALUATION COMPLEXITY: Moderate   GOALS: Goals reviewed with patient? No  SHORT TERM GOALS: Target date: 01/13/2024  patient will be independent with initial HEP and compliant with HEP 3-4 times a week   Baseline: Goal status: MET  2.  Patient will report 50% improvement overall  Baseline:  Goal status: INITIAL  LONG TERM GOALS: Target date: 01/27/2024  Patient will be independent in self management strategies to improve quality of life and functional outcomes.  Baseline:  Goal status: INITIAL  2.  Patient will report 70% improvement overall  Baseline:  Goal status: INITIAL  3.  Patient will improve 5 times sit to stand score to 12 sec or less to demonstrate improved functional mobility and increased leg strength.    Baseline: 15.89 sec Goal status: INITIAL  4.   Patient will increase bilateral leg MMT's to 4+ to 5/5 to allow navigation of steps without gait deviation or loss of balance  Baseline: see above Goal status: INITIAL  5.  Patient will improve Modified Oswestry score by 5 points to demonstrate improved perceived function  Baseline:  15/50 Goal status: INITIAL  PLAN:  PT FREQUENCY: 2x/week  PT DURATION: 4 weeks  PLANNED INTERVENTIONS: 97164- PT Re-evaluation, 97110-Therapeutic exercises, 97530- Therapeutic activity, 97112- Neuromuscular re-education, 97535- Self Care, [id number]- Manual therapy, [id number]- Gait training, [phone]- Orthotic Fit/training, [phone]- Canalith repositioning, [id number]- Aquatic Therapy, [phone]- Splinting, [phone]- Wound care (first 20 sq cm), 97598- Wound care (each additional 20 sq cm)Patient/Family education, Balance training, Stair training, Taping, Dry Needling, Joint mobilization, Joint manipulation, Spinal manipulation, Spinal mobilization, Scar mobilization, and DME instructions. SABRA  PLAN FOR NEXT SESSION: F/U with lumbar decompression; postural and lower extremity strengthening; try to centralize symptoms first  Lang Ada, PT, DPT [facility] Office: [phone] 5:19 PM, 01/20/2024   "

## 2024-01-21 ENCOUNTER — Ambulatory Visit (HOSPITAL_COMMUNITY)

## 2024-01-25 ENCOUNTER — Encounter: Payer: Self-pay | Admitting: Physician Assistant

## 2024-01-25 ENCOUNTER — Ambulatory Visit: Admitting: Physician Assistant

## 2024-01-25 VITALS — BP 129/84 | HR 69 | Temp 98.4°F | Ht 64.0 in | Wt 167.8 lb

## 2024-01-25 DIAGNOSIS — N898 Other specified noninflammatory disorders of vagina: Secondary | ICD-10-CM

## 2024-01-25 DIAGNOSIS — M7701 Medial epicondylitis, right elbow: Secondary | ICD-10-CM

## 2024-01-25 NOTE — Assessment & Plan Note (Signed)
 Resolved yeast infection; current discharge suggests possible bacterial vaginosis. - Self-swab obtained for laboratory analysis to rule out bacterial vaginosis. - Await swab results to determine appropriate treatment. - Educated on normal vaginal discharge and potential for pH imbalance.

## 2024-01-25 NOTE — Progress Notes (Signed)
 "  Acute Office Visit  Subjective:     Patient ID: Kathleen Velasquez, female    DOB: [birth year], 34 y.o.   MRN: [id number]   Discussed the use of AI scribe software for clinical note transcription with the patient, who gave verbal consent to proceed.  History of Present Illness Kathleen Velasquez is a 34 year old female who presents with persistent right elbow pain and recent vaginal discharge.  She has localized right elbow pain between the olecranon process and medial epicondyle, tender to touch without radiation. Compression from an elbow brace at work partially relieves the pain, which returns when the brace is removed. Ice, heat, ibuprofen , Tylenol , and prescribed exercises have not provided significant relief.  Shortly after Christmas she had symptoms consistent with a yeast infection after using a gummy worm in the vaginal area. Monistat one resolved the thick discharge, burning, and itching within a few days. She now has a milky vaginal discharge with very light odor, which is new for her. She denies current burning, itching, strong odor, abdominal pain, nausea, vomiting, or fever.   Review of Systems  Constitutional:  Negative for activity change, appetite change, fatigue and fever.  Gastrointestinal:  Negative for nausea and vomiting.  Genitourinary:  Positive for vaginal discharge. Negative for genital sores, pelvic pain, vaginal bleeding and vaginal pain.  Musculoskeletal:  Positive for arthralgias. Negative for joint swelling and myalgias.        Objective:     BP 129/84   Pulse 69   Temp 98.4 F (36.9 C)   Ht 5' 4 (1.626 m)   Wt 167 lb 12.8 oz (76.1 kg)   SpO2 99%   BMI 28.80 kg/m   Physical Exam Constitutional:      General: She is not in acute distress.    Appearance: Normal appearance. She is not ill-appearing.  HENT:     Head: Normocephalic and atraumatic.     Mouth/Throat:     Mouth: Mucous membranes are moist.     Pharynx: Oropharynx is clear.  Eyes:      Extraocular Movements: Extraocular movements intact.     Conjunctiva/sclera: Conjunctivae normal.  Cardiovascular:     Rate and Rhythm: Normal rate and regular rhythm.     Heart sounds: Normal heart sounds. No murmur heard. Pulmonary:     Effort: Pulmonary effort is normal.     Breath sounds: Normal breath sounds.  Musculoskeletal:     Right elbow: No swelling, deformity or lacerations. Normal range of motion. Tenderness present in medial epicondyle.  Skin:    General: Skin is warm and dry.  Neurological:     General: No focal deficit present.     Mental Status: She is alert and oriented to person, place, and time.  Psychiatric:        Mood and Affect: Mood normal.        Behavior: Behavior normal.     No results found for any visits on 01/25/24.      Assessment & Plan:  Medial epicondylitis of right elbow Assessment & Plan: Persistent pain localized to the right elbow, unresponsive to conservative measures. - Referred to orthopedics for further evaluation and potential steroid injection. - Continue using compression brace as tolerated. - Continue ibuprofen  and acetaminophen  as needed for pain. - Continue exercises as tolerated.  Orders: -     Ambulatory referral to Orthopedic Surgery  Vaginal discharge Assessment & Plan: Resolved yeast infection; current discharge suggests possible bacterial vaginosis. - Self-swab  obtained for laboratory analysis to rule out bacterial vaginosis. - Await swab results to determine appropriate treatment. - Educated on normal vaginal discharge and potential for pH imbalance.  Orders: -     NuSwab Vaginitis Plus (VG+)    Return if symptoms worsen or fail to improve.  Ahmir Bracken, PA-C  "

## 2024-01-25 NOTE — Assessment & Plan Note (Signed)
 Persistent pain localized to the right elbow, unresponsive to conservative measures. - Referred to orthopedics for further evaluation and potential steroid injection. - Continue using compression brace as tolerated. - Continue ibuprofen  and acetaminophen  as needed for pain. - Continue exercises as tolerated.

## 2024-01-26 ENCOUNTER — Ambulatory Visit (HOSPITAL_COMMUNITY)

## 2024-01-28 ENCOUNTER — Encounter (HOSPITAL_COMMUNITY): Payer: Self-pay

## 2024-01-28 ENCOUNTER — Ambulatory Visit (HOSPITAL_COMMUNITY)

## 2024-01-28 ENCOUNTER — Ambulatory Visit: Payer: Self-pay | Admitting: Physician Assistant

## 2024-01-28 DIAGNOSIS — M5416 Radiculopathy, lumbar region: Secondary | ICD-10-CM | POA: Diagnosis not present

## 2024-01-28 DIAGNOSIS — M545 Low back pain, unspecified: Secondary | ICD-10-CM

## 2024-01-28 DIAGNOSIS — R262 Difficulty in walking, not elsewhere classified: Secondary | ICD-10-CM

## 2024-01-28 LAB — NUSWAB VAGINITIS PLUS (VG+)
Candida albicans, NAA: POSITIVE — AB
Candida glabrata, NAA: NEGATIVE
Chlamydia trachomatis, NAA: NEGATIVE
Neisseria gonorrhoeae, NAA: NEGATIVE
Trich vag by NAA: NEGATIVE

## 2024-01-28 MED ORDER — FLUCONAZOLE 150 MG PO TABS: 150.0000 mg | ORAL_TABLET | Freq: Once | ORAL | 0 refills | Status: AC

## 2024-01-28 NOTE — Therapy (Addendum)
 " OUTPATIENT PHYSICAL THERAPY THORACOLUMBAR TREATMENT Progress Note Reporting Period 12/30/23 to 01/28/24  See note below for Objective Data and Assessment of Progress/Goals.    Patient Name: Kathleen Velasquez MRN: [id number] DOB:[birth year], 34 y.o., female Today's Date: 01/28/2024  END OF SESSION:  PT End of Session - 01/28/24 0850     Visit Number 5    Number of Visits 8    Date for Recertification  01/27/24    Authorization Type BC[employer]    Authorization Time Period No auth required    PT Start Time 0854    PT Stop Time 0937    PT Time Calculation (min) 43 min    Activity Tolerance Patient tolerated treatment well    Behavior During Therapy [facility] for tasks assessed/performed            Past Medical History:  Diagnosis Date   Abscess    right buttocks/thigh   Anxiety    Depression    DUB (dysfunctional uterine bleeding) 05/25/2012   Hydradenitis    Hypothyroidism    Polycystic ovaries    Thyroid  disease    hypothyroidism   Past Surgical History:  Procedure Laterality Date   ADENOIDECTOMY     BALLOON DILATION N/A 12/19/2019   Procedure: BALLOON DILATION;  Surgeon: Cindie Carlin POUR, DO;  [facility];  Service: Endoscopy;  Laterality: N/A;   BIOPSY  12/19/2019   Procedure: BIOPSY;  Surgeon: Cindie Carlin POUR, DO;  [facility];  Service: Endoscopy;;   CHOLECYSTECTOMY  01/27/2012   Procedure: LAPAROSCOPIC CHOLECYSTECTOMY;  Surgeon: Thresa JAYSON Pulling, MD;  Location: [facility];  Service: General;  Laterality: N/A;   DILITATION & CURRETTAGE/HYSTROSCOPY WITH HYDROTHERMAL ABLATION N/A 02/10/2023   Procedure: DILATATION & CURETTAGE/HYSTEROSCOPY WITH HYDROTHERMAL ABLATION;  Surgeon: Ozan, Jennifer, DO;  Location: [facility];  Service: Gynecology;  Laterality: N/A;   ESOPHAGOGASTRODUODENOSCOPY (EGD) WITH PROPOFOL  N/A 12/19/2019   Procedure: ESOPHAGOGASTRODUODENOSCOPY (EGD) WITH PROPOFOL ;  Surgeon: Cindie Carlin POUR, DO;  [facility];  Service: Endoscopy;   Laterality: N/A;  3:00pm   HERNIA REPAIR Left    LIH   HYDRADENITIS EXCISION  10/02/2011   Procedure: EXCISION HYDRADENITIS AXILLA;  Surgeon: Thresa JAYSON Pulling, MD;  Location: [facility];  Service: General;  Laterality: Right;  Right Axillary Dissection   HYDRADENITIS EXCISION Left 10/19/2013   Procedure: INCISION OF HIDRADENITIS SUPPURATIVA LEFT AXILLA;  Surgeon: Elsie GORMAN Holland, MD;  Location: [facility];  Service: General;  Laterality: Left;   HYDRADENITIS EXCISION Left 09/09/2018   Procedure: EXCISION HIDRADENITIS AXILLA, LEFT;  Surgeon: Mavis Anes, MD;  Location: [facility];  Service: General;  Laterality: Left;   SHOULDER SURGERY  08/2018   TONSILLECTOMY     Patient Active Problem List   Diagnosis Date Noted   Vaginal discharge 01/25/2024   Lumbar radiculitis 12/14/2023   Depression, recurrent 12/14/2023   Medial epicondylitis of right elbow 12/14/2023   Menorrhagia with regular cycle 02/10/2023   Hidradenitis suppurativa     PCP: Grooms, [place], PA-C  REFERRING PROVIDER: Grooms, [place], PA-C  REFERRING DIAG: M54.16 (ICD-10-CM) - Lumbar radiculitis  Rationale for Evaluation and Treatment: Rehabilitation  THERAPY DIAG:  Lumbar radiculitis  Low back pain, unspecified back pain laterality, unspecified chronicity, unspecified whether sciatica present  Difficulty in walking, not elsewhere classified  ONSET DATE: about a year  SUBJECTIVE:  SUBJECTIVE STATEMENT: Reports she is feeling better today with no radicular symptoms today.  Pain scale 3/10 center of lower back.  Back feels stiff today.  Reports compliance with HEP daily.  Doesn't feel she has made many improvement with therapy.  No reports of pain, has had close calls with Lt knee buckling.  Saw MD yesterday, does not feel to follow up with  MD unless needed.  Eval: Pain in back that goes down left leg for about a year; Her MD Dr. Bertell; treated with prednisone  and ibuprofen .  Fusco retired and saw a new MD who wanted to try PT; x-ray showed mild degenerative changes.  Sometimes hurts in the mornings in the back and when she works it runs down her legs.  Does some lifting up to 50# and stands on cement flooring.  Pain will go from back down left leg; mostly just to knee but sometimes into the foot.    PERTINENT HISTORY:  depression  PAIN:  Are you having pain? Yes: NPRS scale: 7/10 low back; 2/10 at best and 10/10 at worst Pain location: low back and left leg Pain description: stiff; shooting and stabbing Aggravating factors: work Relieving factors: rest  PRECAUTIONS: None  RED FLAGS: None   WEIGHT BEARING RESTRICTIONS: No  FALLS:  Has patient fallen in last 6 months? No  OCCUPATION: works at [employer]  PLOF: Independent  PATIENT GOALS: no pain in back and leg  NEXT MD VISIT: Jan 15th, 2026, none scheduled  OBJECTIVE:  Note: Objective measures were completed at Evaluation unless otherwise noted.  DIAGNOSTIC FINDINGS:  CLINICAL DATA:  Low back pain radiating down the left leg for the past year. No known injury.   EXAM: LUMBAR SPINE - 2-3 VIEW   COMPARISON:  Abdomen and pelvis CT dated 04/08/2021.   FINDINGS: Five non-rib-bearing lumbar vertebrae. Minimal anterior spur formation at the L3-4 level. No fractures, pars defects or subluxations. Cholecystectomy clips.   IMPRESSION: Minimal degenerative changes at the L3-4 level.    PATIENT SURVEYS:  Modified Oswestry:  MODIFIED OSWESTRY DISABILITY SCALE  Date: 12/30/2023 Score                                Total 15/50; 30%   Modified Oswestry:  MODIFIED OSWESTRY DISABILITY SCALE  Date: 01/28/24 Score  Pain intensity 3 =  Pain medication provides me with moderate relief from pain.  2. Personal care (washing, dressing, etc.) 1 =  I can  take care of myself normally, but it increases my pain.  3. Lifting 1 = I can lift heavy weights, but it causes increased pain.  4. Walking 1 = Pain prevents me from walking more than 1 mile.  5. Sitting 0 =  I can sit in any chair as long as I like.  6. Standing 1 =  I can stand as long as I want but, it increases my pain.  7. Sleeping 0 = Pain does not prevent me from sleeping well.  8. Social Life 2 = Pain prevents me from participating in more energetic activities (eg. sports, dancing).  9. Traveling 1 =  I can travel anywhere, but it increases my pain.  10. Employment/ Homemaking 1 = My normal homemaking/job activities increase my pain, but I can still perform all that is required of me  Total 11/50   Interpretation of scores: Score Category Description  0-20% Minimal Disability The patient can cope with most  living activities. Usually no treatment is indicated apart from advice on lifting, sitting and exercise  21-40% Moderate Disability The patient experiences more pain and difficulty with sitting, lifting and standing. Travel and social life are more difficult and they may be disabled from work. Personal care, sexual activity and sleeping are not grossly affected, and the patient can usually be managed by conservative means  41-60% Severe Disability Pain remains the main problem in this group, but activities of daily living are affected. These patients require a detailed investigation  61-80% Crippled Back pain impinges on all aspects of the patients life. Positive intervention is required  81-100% Bed-bound These patients are either bed-bound or exaggerating their symptoms  Bluford FORBES Zoe DELENA Karon DELENA, et al. Surgery versus conservative management of stable thoracolumbar fracture: the PRESTO feasibility RCT. [place]): [employer]; 2021 Nov. [facility] Technology Assessment, No. 25.62.) Appendix 3, Oswestry Disability Index category descriptors. Available from:  Findjewelers.cz  Minimally Clinically Important Difference (MCID) = 12.8%   Interpretation of scores: Score Category Description  0-20% Minimal Disability The patient can cope with most living activities. Usually no treatment is indicated apart from advice on lifting, sitting and exercise  21-40% Moderate Disability The patient experiences more pain and difficulty with sitting, lifting and standing. Travel and social life are more difficult and they may be disabled from work. Personal care, sexual activity and sleeping are not grossly affected, and the patient can usually be managed by conservative means  41-60% Severe Disability Pain remains the main problem in this group, but activities of daily living are affected. These patients require a detailed investigation  61-80% Crippled Back pain impinges on all aspects of the patients life. Positive intervention is required  81-100% Bed-bound These patients are either bed-bound or exaggerating their symptoms  Bluford FORBES Zoe DELENA Karon DELENA, et al. Surgery versus conservative management of stable thoracolumbar fracture: the PRESTO feasibility RCT. [place] [employer]; 2021 Nov. [facility] Technology Assessment, No. 25.62.) Appendix 3, Oswestry Disability Index category descriptors. Available from: Findjewelers.cz  Minimally Clinically Important Difference (MCID) = 12.8%  COGNITION: Overall cognitive status: Within functional limits for tasks assessed     SENSATION: WFL Had numbness only  once  MUSCLE LENGTH: Hamstrings:   POSTURE: rounded shoulders, forward head, and decreased lumbar lordosis  PALPATION: Tenderness in low back more left side than right  LUMBAR ROM: prone lying increased pain down left leg  AROM eval 01/28/24:  Flexion Fingers to ankles Fingers to toes   Extension 30% available Low back pain 60% available *  Right lateral flexion    Left  lateral flexion    Right rotation    Left rotation     (Blank rows = not tested)  LOWER EXTREMITY ROM:     Active  Right eval Left eval  Hip flexion    Hip extension    Hip abduction    Hip adduction    Hip internal rotation    Hip external rotation    Knee flexion    Knee extension    Ankle dorsiflexion    Ankle plantarflexion    Ankle inversion    Ankle eversion     (Blank rows = not tested)  LOWER EXTREMITY MMT:    MMT Right eval Left eval Right 01/28/24 Left 01/28/24  Hip flexion 5 4+ 5 4  Hip extension 4+ 4- 4+ 4  Hip abduction   4+ 4+  Hip adduction      Hip internal  rotation      Hip external rotation      Knee flexion 4+ 4 5 4+  Knee extension 5 5    Ankle dorsiflexion 5 5    Ankle plantarflexion      Ankle inversion      Ankle eversion       (Blank rows = not tested)  FUNCTIONAL TESTS:  5 times sit to stand: 15.89 sec no UE assist   01/28/24:  352ft no AD, no increased pain     5STS 13.86 no UE assist, no increased pain.     SLS Rt 38, Lt 10, 20 pain in Lt lower back GAIT: Distance walked: 50 ft in clinic Assistive device utilized: None Level of assistance: Modified independence Comments: slight decreased stance left lower extremity  TREATMENT DATE:  01/28/24: Oswestry MMT ROM Reviewed goals- doesn't feels  SLS Rt 38, Lt 10, 20 pain in Lt lower back Instructed proper lifting mechanics with 8# box (lifting with LE, core enganged, box close to body, no lumbar flexion) Lifts up to 50# with work.  01/20/2024  Manual Therapy: -CPA/UPA mobilizations of Lumbar segments L1-L5, grade II-III  -STM of Lumbar Paraspinal musculature Neuromuscular Re-education: -Posterior pelvic tilts, 1 set of 10 reps, 3 second holds, pt cued to remain in pain free ROM, while on ice pack -Lower trunk rotations, 2 set of 10 reps, bilaterally, pt cued to remain in pain free ROM, while on ice pack -Prone on elbows, 1 bout of 2 minutes, slight centralization of  symptoms Therapeutic Activity: -Lateral stepping, 1 laps, 10 feet per lap, with RTB around knees, pt cued for upright posture and slight bend in knees -STS, 10 reps, RTB at knees, pt cued for neutral spine and increased width of stance   01/19/24: Log rolling Supine:  - Decompression exercise  - Discussed benefits with MHP vs ice, trial with MHP this session.  - Educated piriformis anatomy, shown picture and educated location  - LTR 5x 10  - SKTC 2x 30  - Hamstring stretch 2x 30 hands behind knee  - Bridge 10x 5 with ab set Sidelying:  - Clam RTB                                  01/03/24 EXERCISE LOG  Exercise Repetitions and Resistance Comments  Supine sciatic nerve glide  10 reps    Supine piriformis stretch  5 x 10 seconds   Double knee to chest  5 x 10 second hold     Lower trunk rotation   10 reps each    Supine hip ADD isometric  2 minutes w/ 5 second hold    Lunges onto step  15 reps each    Standing hamstring stretch  3 x 30 seconds each    Manual therapy  STM to bilateral lumbar paraspinals    Self STM with tennis ball     Lumbar roll out  2 minutes     Blank cell = exercise not performed today   PATIENT EDUCATION:  Education details: Patient educated on exam findings, POC, scope of PT, HEP, and what to expect next visit. Person educated: Patient Education method: Explanation, Demonstration, and Handouts Education comprehension: verbalized understanding, returned demonstration, verbal cues required, and tactile cues required   HOME EXERCISE PROGRAM: Access Code: [email or web address] URL: [email or web address] Date: 12/30/2023 Prepared by: [facility]  Exercises - Lying Prone with 1 Pillow  -  1 x daily - 7 x weekly - 1 reps - 5 min hold - Hooklying Single Knee to Chest Stretch  - 1 x daily - 7 x weekly - 1 sets - 10 reps - 5 hold - Supine Sciatic Nerve Glide  - 1 x daily - 7 x weekly - 1 sets - 10 reps - Supine Figure 4 Piriformis Stretch  - 1 x daily - 7  x weekly - 1 sets - 10 reps - 20 sec hold  01/19/24: - Bridge  - Decompression   ASSESSMENT:  CLINICAL IMPRESSION:  Reviewed goals with the following findings:  Pt reports compliance with HEP daily.  Pt stated she does not feel she has made any improvements since beginning therapy.  Objective testing reveals: ability to ambulate with no reports of increased pain, improved mobility though continues to have increased pain with extension, improved strength though continues to have weakness Lt LE compared to Rt.  Reports of occasional Lt knee buckling, no reports of pain.  Presents with improved self perceived functional ability with [employer].  Pt will continue to benefit from skilled intervention to address goals unmet.  Reports she has a 50# lifting requirement with work, pt will benefit from educated on proper mechanics with lifting and generalized body mechanics to reduce risk of injury.  Plans to continue 3 more session per initial eval and will reassess then.    Eval: Patient is a 34 y.o. female who was seen today for physical therapy evaluation and treatment for M54.16 (ICD-10-CM) - Lumbar radiculitis.  Patient demonstrates muscle weakness, reduced ROM, and fascial restrictions which are likely contributing to symptoms of pain and are negatively impacting patient ability to perform ADLs and functional mobility tasks. Patient will benefit from skilled physical therapy services to address these deficits to reduce pain and improve level of function with ADLs and functional mobility tasks.   OBJECTIVE IMPAIRMENTS: Abnormal gait, decreased activity tolerance, decreased mobility, decreased strength, increased fascial restrictions, impaired perceived functional ability, postural dysfunction, and pain.   ACTIVITY LIMITATIONS: carrying, lifting, bending, standing, and locomotion level  PARTICIPATION LIMITATIONS: meal prep, cleaning, laundry, and occupation  REHAB POTENTIAL: Good  CLINICAL  DECISION MAKING: Evolving/moderate complexity  EVALUATION COMPLEXITY: Moderate   GOALS: Goals reviewed with patient? No  SHORT TERM GOALS: Target date: 01/13/2024  patient will be independent with initial HEP and compliant with HEP 3-4 times a week   Baseline: 01/28/24:  Reports on compliance daily. Goal status: MET  2.  Patient will report 50% improvement overall  Baseline: 01/28/24:  Doesn't feels she has made any improvements with PT Goal status: Not met  LONG TERM GOALS: Target date: 01/27/2024  Patient will be independent in self management strategies to improve quality of life and functional outcomes.  Baseline:  Goal status: In progress  2.  Patient will report 70% improvement overall  Baseline:  01/28/24:  Doesn't feels she has made any improvements with PT Goal status: In progress  3.  Patient will improve 5 times sit to stand score to 12 sec or less to demonstrate improved functional mobility and increased leg strength.    Baseline: 15.89 sec; 11/27/24 5STS 13.86 no UE assist, no increased pain. Goal status: In progress  4.   Patient will increase bilateral leg MMT's to 4+ to 5/5 to allow navigation of steps without gait deviation or loss of balance  Baseline: see above Goal status: In progress   5.  Patient will improve Modified Oswestry score by  5 points to demonstrate improved perceived function  Baseline: 15/50; 11/27/24: 11/50 Goal status: In progress  PLAN:  PT FREQUENCY: 2x/week  PT DURATION: 4 weeks  PLANNED INTERVENTIONS: 97164- PT Re-evaluation, 97110-Therapeutic exercises, 97530- Therapeutic activity, [id number]- Neuromuscular re-education, 97535- Self Care, 02859- Manual therapy, [phone]- Gait training, [phone]- Orthotic Fit/training, [phone]- Canalith repositioning, [id number]- Aquatic Therapy, [phone]- Splinting, [phone]- Wound care (first 20 sq cm), 97598- Wound care (each additional 20 sq cm)Patient/Family education, Balance training, Stair training, Taping,  Dry Needling, Joint mobilization, Joint manipulation, Spinal manipulation, Spinal mobilization, Scar mobilization, and DME instructions. SABRA  PLAN FOR NEXT SESSION: Continue 3 more sessions then reassess to see if feels any improvements with therapy.  Focus on proper lifting and general body mechanics, 50# lifting requirement with work.  Kathleen Velasquez, LPTA/CLT; Kathleen Velasquez [phone]  3:57 PM, 01/28/24   "

## 2024-01-29 NOTE — Addendum Note (Signed)
 Addended byBETHA LEODIS NO S on: 01/29/2024 09:47 AM   Modules accepted: Orders

## 2024-02-01 ENCOUNTER — Ambulatory Visit (HOSPITAL_COMMUNITY)

## 2024-02-01 ENCOUNTER — Telehealth (HOSPITAL_COMMUNITY): Payer: Self-pay

## 2024-02-01 NOTE — Telephone Encounter (Signed)
 No show, called and left message concerning missed apt today. Reminded next apt date and time with contact number included if needs to reschedule or cancel apts in the future.   Augustin Mclean, LPTA/CLT; [facility] [phone]

## 2024-02-03 ENCOUNTER — Ambulatory Visit (HOSPITAL_COMMUNITY)

## 2024-02-03 ENCOUNTER — Telehealth (HOSPITAL_COMMUNITY): Payer: Self-pay

## 2024-02-03 NOTE — Telephone Encounter (Signed)
 2nd no show, called and left message concerning today's apt. Reminded next apt date and time with contact number included. Educated no show policy details, encouraged to call for cancel/reschedule future apts.   Augustin Mclean, LPTA/CLT; [facility] [phone]

## 2024-02-07 ENCOUNTER — Ambulatory Visit (HOSPITAL_COMMUNITY)

## 2024-02-18 ENCOUNTER — Ambulatory Visit: Admitting: Orthopedic Surgery
